# Patient Record
Sex: Male | Born: 1945 | Race: White | Hispanic: No | Marital: Married | Smoking: Former smoker
Health system: Southern US, Community
[De-identification: ages and names within clinical notes are randomized; demographics above are authoritative.]

## PROBLEM LIST (undated history)

## (undated) DIAGNOSIS — F4310 Post-traumatic stress disorder, unspecified: Secondary | ICD-10-CM

## (undated) DIAGNOSIS — C801 Malignant (primary) neoplasm, unspecified: Secondary | ICD-10-CM

## (undated) DIAGNOSIS — I480 Paroxysmal atrial fibrillation: Secondary | ICD-10-CM

## (undated) DIAGNOSIS — K219 Gastro-esophageal reflux disease without esophagitis: Secondary | ICD-10-CM

## (undated) DIAGNOSIS — Z9989 Dependence on other enabling machines and devices: Secondary | ICD-10-CM

## (undated) DIAGNOSIS — Z95810 Presence of automatic (implantable) cardiac defibrillator: Secondary | ICD-10-CM

## (undated) DIAGNOSIS — F419 Anxiety disorder, unspecified: Secondary | ICD-10-CM

## (undated) DIAGNOSIS — I639 Cerebral infarction, unspecified: Secondary | ICD-10-CM

## (undated) DIAGNOSIS — M1990 Unspecified osteoarthritis, unspecified site: Secondary | ICD-10-CM

## (undated) DIAGNOSIS — F32A Depression, unspecified: Secondary | ICD-10-CM

## (undated) DIAGNOSIS — I219 Acute myocardial infarction, unspecified: Secondary | ICD-10-CM

## (undated) DIAGNOSIS — I2510 Atherosclerotic heart disease of native coronary artery without angina pectoris: Secondary | ICD-10-CM

## (undated) DIAGNOSIS — E119 Type 2 diabetes mellitus without complications: Secondary | ICD-10-CM

## (undated) DIAGNOSIS — F329 Major depressive disorder, single episode, unspecified: Secondary | ICD-10-CM

## (undated) DIAGNOSIS — G4733 Obstructive sleep apnea (adult) (pediatric): Secondary | ICD-10-CM

## (undated) DIAGNOSIS — E7800 Pure hypercholesterolemia, unspecified: Secondary | ICD-10-CM

## (undated) DIAGNOSIS — I509 Heart failure, unspecified: Secondary | ICD-10-CM

## (undated) DIAGNOSIS — I10 Essential (primary) hypertension: Secondary | ICD-10-CM

## (undated) DIAGNOSIS — Z8719 Personal history of other diseases of the digestive system: Secondary | ICD-10-CM

## (undated) HISTORY — PX: CARDIAC DEFIBRILLATOR PLACEMENT: SHX171

## (undated) HISTORY — PX: CORONARY ANGIOPLASTY WITH STENT PLACEMENT: SHX49

---

## 1997-09-04 DIAGNOSIS — I2510 Atherosclerotic heart disease of native coronary artery without angina pectoris: Secondary | ICD-10-CM

## 1997-09-04 HISTORY — PX: CORONARY ARTERY BYPASS GRAFT: SHX141

## 1997-09-04 HISTORY — DX: Atherosclerotic heart disease of native coronary artery without angina pectoris: I25.10

## 2007-06-26 ENCOUNTER — Emergency Department (HOSPITAL_COMMUNITY): Admission: EM | Admit: 2007-06-26 | Discharge: 2007-06-26 | Payer: Self-pay | Admitting: Emergency Medicine

## 2011-06-14 LAB — URINALYSIS, ROUTINE W REFLEX MICROSCOPIC
Bilirubin Urine: NEGATIVE
Glucose, UA: >1000 — AB
Ketones, ur: NEGATIVE
Nitrite: NEGATIVE
Protein, ur: >300 — AB
Specific Gravity, Urine: 1.034 — ABNORMAL HIGH
Urobilinogen, UA: 0.2
pH: 5.5

## 2011-06-14 LAB — CBC
Platelets: 102 — ABNORMAL LOW
WBC: 5.6

## 2011-06-14 LAB — DIFFERENTIAL
Basophils Absolute: 0
Lymphocytes Relative: 3 — ABNORMAL LOW
Lymphs Abs: 0.2 — ABNORMAL LOW
Neutro Abs: 5.3
Neutrophils Relative %: 96 — ABNORMAL HIGH

## 2011-06-14 LAB — URINE MICROSCOPIC-ADD ON

## 2011-06-14 LAB — POCT CARDIAC MARKERS
Myoglobin, poc: 235
Operator id: 4661
Troponin i, poc: <0.05

## 2011-06-14 LAB — BASIC METABOLIC PANEL
BUN: 23
CO2: 28
Calcium: 9.1
Chloride: 100
Creatinine, Ser: 1.32
GFR calc Af Amer: >60
GFR calc non Af Amer: 55 — ABNORMAL LOW
Glucose, Bld: 323 — ABNORMAL HIGH
Potassium: 3.9
Sodium: 138

## 2012-10-20 ENCOUNTER — Observation Stay (HOSPITAL_COMMUNITY)
Admission: EM | Admit: 2012-10-20 | Discharge: 2012-10-22 | Disposition: A | Discharge status: Home / Self Care | Payer: Non-veteran care | Attending: Internal Medicine | Admitting: Internal Medicine

## 2012-10-20 ENCOUNTER — Emergency Department (HOSPITAL_COMMUNITY): Payer: Non-veteran care

## 2012-10-20 ENCOUNTER — Encounter (HOSPITAL_COMMUNITY): Payer: Self-pay | Admitting: Emergency Medicine

## 2012-10-20 DIAGNOSIS — I10 Essential (primary) hypertension: Secondary | ICD-10-CM

## 2012-10-20 DIAGNOSIS — D696 Thrombocytopenia, unspecified: Secondary | ICD-10-CM | POA: Diagnosis present

## 2012-10-20 DIAGNOSIS — E118 Type 2 diabetes mellitus with unspecified complications: Secondary | ICD-10-CM | POA: Diagnosis present

## 2012-10-20 DIAGNOSIS — E119 Type 2 diabetes mellitus without complications: Secondary | ICD-10-CM

## 2012-10-20 DIAGNOSIS — I2510 Atherosclerotic heart disease of native coronary artery without angina pectoris: Secondary | ICD-10-CM

## 2012-10-20 DIAGNOSIS — Z95810 Presence of automatic (implantable) cardiac defibrillator: Secondary | ICD-10-CM

## 2012-10-20 DIAGNOSIS — Z8673 Personal history of transient ischemic attack (TIA), and cerebral infarction without residual deficits: Secondary | ICD-10-CM

## 2012-10-20 DIAGNOSIS — Z794 Long term (current) use of insulin: Secondary | ICD-10-CM | POA: Insufficient documentation

## 2012-10-20 DIAGNOSIS — G459 Transient cerebral ischemic attack, unspecified: Principal | ICD-10-CM

## 2012-10-20 DIAGNOSIS — Z951 Presence of aortocoronary bypass graft: Secondary | ICD-10-CM

## 2012-10-20 DIAGNOSIS — I252 Old myocardial infarction: Secondary | ICD-10-CM | POA: Insufficient documentation

## 2012-10-20 DIAGNOSIS — E785 Hyperlipidemia, unspecified: Secondary | ICD-10-CM

## 2012-10-20 HISTORY — DX: Cerebral infarction, unspecified: I63.9

## 2012-10-20 HISTORY — DX: Heart failure, unspecified: I50.9

## 2012-10-20 HISTORY — DX: Essential (primary) hypertension: I10

## 2012-10-20 HISTORY — DX: Acute myocardial infarction, unspecified: I21.9

## 2012-10-20 LAB — CBC WITH DIFFERENTIAL/PLATELET
Basophils Absolute: 0 10*3/uL (ref 0.0–0.1)
Basophils Relative: 0 % (ref 0–1)
Eosinophils Absolute: 0.3 10*3/uL (ref 0.0–0.7)
HCT: 36.8 % — ABNORMAL LOW (ref 39.0–52.0)
MCH: 32.8 pg (ref 26.0–34.0)
MCHC: 35.9 g/dL (ref 30.0–36.0)
Monocytes Absolute: 0.6 10*3/uL (ref 0.1–1.0)
Monocytes Relative: 7 % (ref 3–12)
Neutro Abs: 6.7 10*3/uL (ref 1.7–7.7)
Neutrophils Relative %: 77 % (ref 43–77)
RDW: 13.9 % (ref 11.5–15.5)

## 2012-10-20 LAB — COMPREHENSIVE METABOLIC PANEL
AST: 19 U/L (ref 0–37)
Albumin: 3.5 g/dL (ref 3.5–5.2)
BUN: 24 mg/dL — ABNORMAL HIGH (ref 6–23)
Chloride: 107 mEq/L (ref 96–112)
Creatinine, Ser: 0.96 mg/dL (ref 0.50–1.35)
Total Bilirubin: 0.9 mg/dL (ref 0.3–1.2)
Total Protein: 6.4 g/dL (ref 6.0–8.3)

## 2012-10-20 LAB — POCT I-STAT, CHEM 8
Calcium, Ion: 1.25 mmol/L (ref 1.13–1.30)
Creatinine, Ser: 1.1 mg/dL (ref 0.50–1.35)
Glucose, Bld: 59 mg/dL — ABNORMAL LOW (ref 70–99)
HCT: 38 % — ABNORMAL LOW (ref 39.0–52.0)
Hemoglobin: 12.9 g/dL — ABNORMAL LOW (ref 13.0–17.0)

## 2012-10-20 LAB — GLUCOSE, CAPILLARY
Glucose-Capillary: 63 mg/dL — ABNORMAL LOW (ref 70–99)
Glucose-Capillary: 134 mg/dL — ABNORMAL HIGH (ref 70–99)

## 2012-10-20 LAB — TROPONIN I: Troponin I: <0.3 ng/mL (ref <0.30)

## 2012-10-20 MED ORDER — CARVEDILOL 25 MG PO TABS
25.0000 mg | ORAL_TABLET | Freq: Two times a day (BID) | ORAL | Status: DC
  Administered 2012-10-21 – 2012-10-22 (×3): 25 mg via ORAL
  Filled 2012-10-20 (×5): qty 1

## 2012-10-20 MED ORDER — ACETAMINOPHEN 650 MG RE SUPP: 650.0000 mg | RECTAL | Status: DC | PRN

## 2012-10-20 MED ORDER — ASPIRIN EC 81 MG PO TBEC
81.0000 mg | DELAYED_RELEASE_TABLET | Freq: Every day | ORAL | Status: DC
  Administered 2012-10-21 – 2012-10-22 (×2): 81 mg via ORAL
  Filled 2012-10-20 (×2): qty 1

## 2012-10-20 MED ORDER — SENNOSIDES-DOCUSATE SODIUM 8.6-50 MG PO TABS
1.0000 | ORAL_TABLET | Freq: Every evening | ORAL | Status: DC | PRN
  Filled 2012-10-20: qty 1

## 2012-10-20 MED ORDER — ATORVASTATIN CALCIUM 40 MG PO TABS
40.0000 mg | ORAL_TABLET | Freq: Every day | ORAL | Status: DC
  Administered 2012-10-20 – 2012-10-22 (×3): 40 mg via ORAL
  Filled 2012-10-20 (×3): qty 1

## 2012-10-20 MED ORDER — INSULIN GLARGINE 100 UNIT/ML ~~LOC~~ SOLN
20.0000 [IU] | Freq: Every day | SUBCUTANEOUS | Status: DC
  Administered 2012-10-20 – 2012-10-21 (×2): 20 [IU] via SUBCUTANEOUS

## 2012-10-20 MED ORDER — INSULIN ASPART 100 UNIT/ML ~~LOC~~ SOLN
0.0000 [IU] | Freq: Three times a day (TID) | SUBCUTANEOUS | Status: DC
  Administered 2012-10-21: 2 [IU] via SUBCUTANEOUS
  Administered 2012-10-22: 1 [IU] via SUBCUTANEOUS

## 2012-10-20 MED ORDER — ONDANSETRON HCL 4 MG/2ML IJ SOLN: 4.0000 mg | Freq: Four times a day (QID) | INTRAMUSCULAR | Status: DC | PRN

## 2012-10-20 MED ORDER — HYDRALAZINE HCL 10 MG PO TABS
10.0000 mg | ORAL_TABLET | Freq: Three times a day (TID) | ORAL | Status: DC
  Administered 2012-10-20 – 2012-10-22 (×5): 10 mg via ORAL
  Filled 2012-10-20 (×7): qty 1

## 2012-10-20 MED ORDER — LISINOPRIL 10 MG PO TABS
10.0000 mg | ORAL_TABLET | Freq: Every day | ORAL | Status: DC
  Administered 2012-10-21 – 2012-10-22 (×2): 10 mg via ORAL
  Filled 2012-10-20 (×2): qty 1

## 2012-10-20 MED ORDER — FLUOXETINE HCL 20 MG PO CAPS
20.0000 mg | ORAL_CAPSULE | Freq: Every day | ORAL | Status: DC
  Administered 2012-10-20 – 2012-10-21 (×2): 20 mg via ORAL
  Filled 2012-10-20 (×3): qty 1

## 2012-10-20 MED ORDER — ACETAMINOPHEN 325 MG PO TABS: 650.0000 mg | ORAL_TABLET | ORAL | Status: DC | PRN

## 2012-10-20 MED ORDER — OMEGA-3-ACID ETHYL ESTERS 1 G PO CAPS
3.0000 g | ORAL_CAPSULE | Freq: Two times a day (BID) | ORAL | Status: DC
  Administered 2012-10-20 – 2012-10-22 (×4): 3 g via ORAL
  Filled 2012-10-20 (×5): qty 3

## 2012-10-20 MED ORDER — CLOPIDOGREL BISULFATE 75 MG PO TABS
75.0000 mg | ORAL_TABLET | Freq: Every day | ORAL | Status: DC
  Administered 2012-10-21 – 2012-10-22 (×2): 75 mg via ORAL
  Filled 2012-10-20 (×4): qty 1

## 2012-10-20 MED ORDER — ENOXAPARIN SODIUM 40 MG/0.4ML ~~LOC~~ SOLN: 40.0000 mg | SUBCUTANEOUS | Status: DC

## 2012-10-20 NOTE — ED Provider Notes (Addendum)
History     CSN: [id number]  Arrival date & time 10/20/12  1253   First MD Initiated Contact with Patient 10/20/12 1528      Chief Complaint  Patient presents with  . Numbness    left side    (Consider location/radiation/quality/duration/timing/severity/associated sxs/prior treatment) The history is provided by the patient.   67 year old male has been having episodes of numbness over the last 3 days. The numbness involves the ulnar aspect of the left hand and forearm and the entire left lower leg and up to the distal thigh. Numbness tends to last about 10 minutes before resolving. He has not noticed any focal motor weakness but he is feeling generally weak today. There is no headache. He denies any nausea or vomiting. He denies any incoordination. There has been no chest pain, heaviness, tightness, pressure. Of note, he does have a pacemaker defibrillator which was inserted one year ago and he showed me the card that he was given an additional staple whether the unit is MRI compliant.  Past Medical History  Diagnosis Date  . Hypertension   . Diabetes mellitus without complication   . Stroke   . CHF (congestive heart failure)   . Coronary artery disease   . Myocardial infarction     Past Surgical History  Procedure Laterality Date  . Coronary artery bypass graft    . Heart stent      Dr. Yetta Barre in [place], [place]  . Cardiac defibrillator placement      Biotronik    No family history on file.  History  Substance Use Topics  . Smoking status: Never Smoker   . Smokeless tobacco: Not on file  . Alcohol Use: Yes     Comment: seldom      Review of Systems  All other systems reviewed and are negative.    Allergies  Review of patient's allergies indicates not on file.  Home Medications   Current Outpatient Rx  Name  Route  Sig  Dispense  Refill  . aspirin EC 81 MG tablet   Oral   Take 81 mg by mouth daily.         Marland Kitchen atorvastatin (LIPITOR) 80 MG tablet   Oral    Take 40 mg by mouth daily.         . carvedilol (COREG) 25 MG tablet   Oral   Take 25 mg by mouth 2 (two) times daily with a meal.         . clopidogrel (PLAVIX) 75 MG tablet   Oral   Take 75 mg by mouth daily.         . cyanocobalamin (,VITAMIN B-12,) 1000 MCG/ML injection   Intramuscular   Inject 1,000 mcg into the muscle every 30 (thirty) days.         Marland Kitchen FLUoxetine (PROZAC) 20 MG capsule   Oral   Take 20 mg by mouth at bedtime.         Marland Kitchen glyBURIDE (DIABETA) 5 MG tablet   Oral   Take 10 mg by mouth 2 (two) times daily.         . hydrALAZINE (APRESOLINE) 10 MG tablet   Oral   Take 20 mg by mouth 3 (three) times daily.         . insulin glargine (LANTUS) 100 UNIT/ML injection   Subcutaneous   Inject 30 Units into the skin at bedtime.         Marland Kitchen lisinopril (PRINIVIL,ZESTRIL) 40 MG tablet  Oral   Take 40 mg by mouth daily.         . metFORMIN (GLUCOPHAGE) 500 MG tablet   Oral   Take 500 mg by mouth 2 (two) times daily with a meal.         . omega-3 acid ethyl esters (LOVAZA) 1 G capsule   Oral   Take 3 g by mouth 2 (two) times daily.         Marland Kitchen spironolactone (ALDACTONE) 25 MG tablet   Oral   Take 25 mg by mouth daily.           BP 168/73  Pulse 65  Temp(Src) 98.7 F (37.1 C) (Oral)  Resp 18  SpO2 99%  Physical Exam  Nursing note and vitals reviewed.  67 year old male, resting comfortably and in no acute distress. Vital signs are significant for hypertension with blood pressure 160/73. Oxygen saturation is 99%, which is normal. Head is normocephalic and atraumatic. PERRLA, EOMI. Oropharynx is clear. Fundi show no hemorrhage, exudate, or papilledema. Neck is nontender and supple without adenopathy or JVD. There no carotid bruits. Back is nontender and there is no CVA tenderness. Lungs are clear without rales, wheezes, or rhonchi. Chest is nontender. Heart has regular rate and rhythm without murmur. Abdomen is soft, flat, nontender  without masses or hepatosplenomegaly and peristalsis is normoactive. Extremities have no 2 edema, full range of motion is present. Skin is warm and dry without rash. Neurologic: Mental status is normal, cranial nerves are intact, there are no motor or sensory deficits. There is no pronator drift.  ED Course  Procedures (including critical care time)  Results for orders placed during the hospital encounter of 10/20/12  GLUCOSE, CAPILLARY      Result Value Range   Glucose-Capillary 63 (*) 70 - 99 mg/dL   Comment 1 Notify RN    CBC WITH DIFFERENTIAL      Result Value Range   WBC 8.7  4.0 - 10.5 K/uL   RBC 4.03 (*) 4.22 - 5.81 MIL/uL   Hemoglobin 13.2  13.0 - 17.0 g/dL   HCT 95.6 (*) 21.3 - 08.6 %   MCV 91.3  78.0 - 100.0 fL   MCH 32.8  26.0 - 34.0 pg   MCHC 35.9  30.0 - 36.0 g/dL   RDW 57.8  46.9 - 62.9 %   Platelets 81 (*) 150 - 400 K/uL   Neutrophils Relative 77  43 - 77 %   Neutro Abs 6.7  1.7 - 7.7 K/uL   Lymphocytes Relative 13  12 - 46 %   Lymphs Abs 1.1  0.7 - 4.0 K/uL   Monocytes Relative 7  3 - 12 %   Monocytes Absolute 0.6  0.1 - 1.0 K/uL   Eosinophils Relative 4  0 - 5 %   Eosinophils Absolute 0.3  0.0 - 0.7 K/uL   Basophils Relative 0  0 - 1 %   Basophils Absolute 0.0  0.0 - 0.1 K/uL  COMPREHENSIVE METABOLIC PANEL      Result Value Range   Sodium 141  135 - 145 mEq/L   Potassium 4.1  3.5 - 5.1 mEq/L   Chloride 107  96 - 112 mEq/L   CO2 24  19 - 32 mEq/L   Glucose, Bld 112 (*) 70 - 99 mg/dL   BUN 24 (*) 6 - 23 mg/dL   Creatinine, Ser 5.28  0.50 - 1.35 mg/dL   Calcium 9.3  8.4 - 41.3 mg/dL  Total Protein 6.4  6.0 - 8.3 g/dL   Albumin 3.5  3.5 - 5.2 g/dL   AST 19  0 - 37 U/L   ALT 14  0 - 53 U/L   Alkaline Phosphatase 69  39 - 117 U/L   Total Bilirubin 0.9  0.3 - 1.2 mg/dL   GFR calc non Af Amer 83 (*) >90 mL/min   GFR calc Af Amer >90  >90 mL/min  TROPONIN I      Result Value Range   Troponin I <0.30  <0.30 ng/mL  POCT I-STAT, CHEM 8      Result Value  Range   Sodium 143  135 - 145 mEq/L   Potassium 3.9  3.5 - 5.1 mEq/L   Chloride 105  96 - 112 mEq/L   BUN 25 (*) 6 - 23 mg/dL   Creatinine, Ser 9.60  0.50 - 1.35 mg/dL   Glucose, Bld 59 (*) 70 - 99 mg/dL   Calcium, Ion 4.54  0.98 - 1.30 mmol/L   TCO2 26  0 - 100 mmol/L   Hemoglobin 12.9 (*) 13.0 - 17.0 g/dL   HCT 11.9 (*) 14.7 - 82.9 %   Dg Chest 2 View  10/20/2012  *RADIOLOGY REPORT*  Clinical Data: Left-sided numbness, tingling.  CHEST - 2 VIEW  Comparison: None  Findings: Left AICD is in place with leads in the right atrium and right ventricle.  Prior CABG.  Cardiomegaly.  No focal airspace opacities or effusions.  No acute bony abnormality.  IMPRESSION: Cardiomegaly.  No acute cardiopulmonary disease.   Original Report Authenticated By: Charlett Nose, M.D.    Ct Head Wo Contrast  10/20/2012  *RADIOLOGY REPORT*  Clinical Data: Left-sided numbness.  CT HEAD WITHOUT CONTRAST  Technique:  Contiguous axial images were obtained from the base of the skull through the vertex without contrast.  Comparison: None.  Findings: Area of low density in the anterior left frontal lobe compatible with old infarct.  Mild chronic microvascular changes in the deep white matter.  No evidence for acute infarction.  No hemorrhage or hydrocephalus.  No midline shift.  No acute calvarial abnormality. Visualized paranasal sinuses and mastoids clear. Orbital soft tissues unremarkable.  IMPRESSION: Old left frontal infarct.  No acute infarction.  Chronic microvascular ischemic changes throughout the deep white matter.   Original Report Authenticated By: Charlett Nose, M.D.     Date: 10/20/2012  Rate: 59  Rhythm: sinus bradycardia  QRS Axis: normal  Intervals: PR prolonged  ST/T Wave abnormalities: nonspecific T wave changes  Conduction Disutrbances:first-degree A-V block   Narrative Interpretation: Sinus bradycardia with first degree AV block, nonspecific T wave flattening, old inferior wall myocardial infarction,  possible old anteroseptal wall myocardial infarction. No prior ECG available for comparison.  Old EKG Reviewed: none available     1. Transient ischemic attack       MDM  Apparent transient ischemic attacks. Workup has been initiated but MRI is not being done in children we can determine for sure whether his defibrillator is MRI compliant.  Workup is significant for old stroke. Case is discussed with Dr.[facility]herge [facility] who agrees to admit the patient.       Dione Booze, MD 10/20/12 1830  Dione Booze, MD 10/20/12 [phone]

## 2012-10-20 NOTE — ED Notes (Signed)
Pt c/o numbness from fingers up left arm and toes up left leg. Numbness increased today. [facility] is [facility] [place].

## 2012-10-20 NOTE — ED Notes (Signed)
Started 2 days ago- numbness from left pinkie half way up arm, numbness from left foot half way up left leg. Numbness went away, but comes back-- numbness to left arm "very slight" at present.

## 2012-10-20 NOTE — Progress Notes (Signed)
Centralized monitor tech called and patient placed on telemetry. Patient oriented to room.

## 2012-10-20 NOTE — H&P (Addendum)
[facility] History and Physical  Vernon Briggs ZOX:[id number] DOB: [birth year] DOA: 10/20/2012  Referring physician: Dr. Preston Fleeting PCP: Default, Provider, MD   Chief Complaint: Transient numbness  HPI: Vernon Briggs is a 67 y.o. male  This is a 67 year old male with a past medical history of coronary artery disease status post previous heart attacks, and per patient, 6 stents placed as well as a bypass surgery, and an ICD placed last [facility] at the [facility] for - per patient - low function of the heart pump. He denies any previous arrhythmias. He also has a history of a previous CVA, left frontal, with right-sided weakness that has resolved with physical therapy. He currently denies any motor deficits on the right. Has a history of hyperlipidemia and is on statin. Has a history of diabetes, insulin-dependent, without complications - per patient. He denies any numbness from the diabetes, he is followed regularly by an ophthalmologist, and he is unaware of any renal disease.   He presents today with a chief complaint of numbness on his left hand and forearm (mainly the fifth digit) as well as numbness of the entire left lower leg and up to the distal thigh. He states that these episodes come and go without any precipitating factors. Usually last about 10-15 minutes. He is also feeling weak and "woozy". Episodes usually resolve on their own. He has no abdominal complaints, denies any nausea, vomiting or diarrhea. He denies any fever or chills. He denies any syncopal episodes, or episodes of confusion with his numbness. He is fully functional at home. He denies any history of peptic ulcer disease or GI bleed  Review of Systems: As per history of present illness otherwise negative  Past Medical History  Diagnosis Date  . Hypertension   . Diabetes mellitus without complication   . Stroke   . CHF (congestive heart failure)   . Coronary artery disease   . Myocardial infarction    Past Surgical  History  Procedure Laterality Date  . Coronary artery bypass graft    . Heart stent      Dr. Yetta Barre in [place], [place]  . Cardiac defibrillator placement      Biotronik   Social History:  reports that he has never smoked. He does not have any smokeless tobacco history on file. He reports that  drinks alcohol. He reports that he does not use illicit drugs.  No known drug allergies  Family history significant for heart attack in his father. His mother lived to 85 years old.  Prior to Admission medications   Medication Sig Start Date End Date Taking? Authorizing Provider  aspirin EC 81 MG tablet Take 81 mg by mouth daily.   Yes Historical Provider, MD  atorvastatin (LIPITOR) 80 MG tablet Take 40 mg by mouth daily.   Yes Historical Provider, MD  carvedilol (COREG) 25 MG tablet Take 25 mg by mouth 2 (two) times daily with a meal.   Yes Historical Provider, MD  clopidogrel (PLAVIX) 75 MG tablet Take 75 mg by mouth daily.   Yes Historical Provider, MD  cyanocobalamin (,VITAMIN B-12,) 1000 MCG/ML injection Inject 1,000 mcg into the muscle every 30 (thirty) days.   Yes Historical Provider, MD  FLUoxetine (PROZAC) 20 MG capsule Take 20 mg by mouth at bedtime.   Yes Historical Provider, MD  glyBURIDE (DIABETA) 5 MG tablet Take 10 mg by mouth 2 (two) times daily.   Yes Historical Provider, MD  hydrALAZINE (APRESOLINE) 10 MG tablet Take 20 mg by mouth  3 (three) times daily.   Yes Historical Provider, MD  insulin glargine (LANTUS) 100 UNIT/ML injection Inject 30 Units into the skin at bedtime.   Yes Historical Provider, MD  lisinopril (PRINIVIL,ZESTRIL) 40 MG tablet Take 40 mg by mouth daily.   Yes Historical Provider, MD  metFORMIN (GLUCOPHAGE) 500 MG tablet Take 500 mg by mouth 2 (two) times daily with a meal.   Yes Historical Provider, MD  omega-3 acid ethyl esters (LOVAZA) 1 G capsule Take 3 g by mouth 2 (two) times daily.   Yes Historical Provider, MD  spironolactone (ALDACTONE) 25 MG tablet Take 25 mg  by mouth daily.   Yes Historical Provider, MD   Physical Exam: Filed Vitals:   10/20/12 1309 10/20/12 1524 10/20/12 1532 10/20/12 1751  BP: 154/73  168/73 166/72  Pulse: 63  65 66  Temp: 97.9 F (36.6 C) 98.7 F (37.1 C) 98.7 F (37.1 C)   TempSrc: Oral  Oral   Resp: 18   16  SpO2: 98%  99% 98%     General:  He is in no acute distress  Eyes: Pupils are equally round and reactive to light, extraocular movements intact  ENT: Moist oropharynx  Neck: Supple, no JVD appreciated  Cardiovascular: Regular rate and rhythm, without murmurs rubs or gallops  Respiratory: Lungs clear to auscultation bilaterally, good air movement, without any wheezing or crackles.  Abdomen: Soft, nontender to palpation  Skin: No rashes  Musculoskeletal: No peripheral edema  Psychiatric: Normal mood and affect  Neurologic: Cranial nerves 2-12 grossly intact, sensation intact in upper and lower extremities, motor strength 5 out of 5 in all 4 extremities, DTR 2+.  Labs on Admission:  Basic Metabolic Panel:  Recent Labs Lab 10/20/12 1503 10/20/12 1633  NA 143 141  K 3.9 4.1  CL 105 107  CO2  --  24  GLUCOSE 59* 112*  BUN 25* 24*  CREATININE 1.10 0.96  CALCIUM  --  9.3   Liver Function Tests:  Recent Labs Lab 10/20/12 1633  AST 19  ALT 14  ALKPHOS 69  BILITOT 0.9  PROT 6.4  ALBUMIN 3.5   CBC:  Recent Labs Lab 10/20/12 1503 10/20/12 1633  WBC  --  8.7  NEUTROABS  --  6.7  HGB 12.9* 13.2  HCT 38.0* 36.8*  MCV  --  91.3  PLT  --  81*   Cardiac Enzymes:  Recent Labs Lab 10/20/12 1630  TROPONINI <0.30    CBG:  Recent Labs Lab 10/20/12 1445  GLUCAP 63*    Radiological Exams on Admission: Dg Chest 2 View  10/20/2012  *RADIOLOGY REPORT*  Clinical Data: Left-sided numbness, tingling.  CHEST - 2 VIEW  Comparison: None  Findings: Left AICD is in place with leads in the right atrium and right ventricle.  Prior CABG.  Cardiomegaly.  No focal airspace opacities or  effusions.  No acute bony abnormality.  IMPRESSION: Cardiomegaly.  No acute cardiopulmonary disease.   Original Report Authenticated By: Charlett Nose, M.D.    Ct Head Wo Contrast  10/20/2012  *RADIOLOGY REPORT*  Clinical Data: Left-sided numbness.  CT HEAD WITHOUT CONTRAST  Technique:  Contiguous axial images were obtained from the base of the skull through the vertex without contrast.  Comparison: None.  Findings: Area of low density in the anterior left frontal lobe compatible with old infarct.  Mild chronic microvascular changes in the deep white matter.  No evidence for acute infarction.  No hemorrhage or hydrocephalus.  No midline  shift.  No acute calvarial abnormality. Visualized paranasal sinuses and mastoids clear. Orbital soft tissues unremarkable.  IMPRESSION: Old left frontal infarct.  No acute infarction.  Chronic microvascular ischemic changes throughout the deep white matter.   Original Report Authenticated By: Charlett Nose, M.D.     EKG: Independently reviewed. Sinus bradycardia  Assessment/Plan Principal Problem:   TIA (transient ischemic attack) Active Problems:   History of CVA (cerebrovascular accident)   CAD (coronary artery disease)   Hx of CABG   ICD (implantable cardiac defibrillator) in place   Diabetes mellitus   Hyperlipidemia   Hypertension   Thrombocytopenia   1. TIA - this is of high concern given his presentation and his history. Neurology has been consulted, Dr Cyril Mourning. We cannot pursue an MRI at this time secondary to his ICD. CAT scan of the head showed the old infarct without any new acute findings. We'll get a 2-D echo, carotid duplex, lipid panel, hemoglobin A1c. We'll continue his aspirin and Plavix.  2. History of CVA - right-sided weakness resolved. We'll continue his home medications.  3. Coronary artery disease - will continue his home medications. 2-D echo pending. [facility] is [facility] compatible or  not. 4. Probable systolic heart failure - he has ICD and was told about poor pump function. 2D echo pending.  5. ICD - will place patient on telemetry.  6. Hyperlipidemia - update a lipid panel and continue his home statin.  7. Diabetes - will hold oral hypoglycemics and use insulin only 8. Hypertension - restart his home meds at lower doses.  9. Thrombocytopenia - patient thinks he has been told this before. Platelets 80 on admission. Platelets low at 102 in 2008. 10. Prophylaxis - SCDs given Tcpenia.   Code Status: DO NOT RESUSCITATE  Family Communication: wife  Disposition Plan: 1-2 days pending TIA workup.  Time spent: 25  Pamella Pert [facility] Pager [phone]  If 7PM-7AM, please contact night-coverage [email or web address] Password [facility] 10/20/2012, 7:04 PM

## 2012-10-20 NOTE — ED Notes (Signed)
NO ANSWER

## 2012-10-20 NOTE — Consult Note (Signed)
Referring Physician: Pamella Pert, MD     Chief Complaint: episodic numbness left arm and leg.  HPI:                                                                                                                                         Vernon Briggs is an 67 y.o. 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 He said that the numbness occurs usually simultaneously and last for about 10 minutes and completely resolves. No face involvement and there is not associated weakness, headache, vertigo, double vision, difficulty swallowing, slurred speech, language or visual impairment. He is fully aware of his surroundings during such episodes.  Feels " a little dizzy and oozy" at the time of the episode. The last episode occurred this morning. He gets his medical care at the [facility] hospital, called there today and was advised to come to [facility] ED for further evaluation. Takes aspirin and plavix religiously. CT brain in the ED showed no acute intracranial abnormality.   LSN: 3 days ago. tPA Given: no, as his symptoms don't meet criteria for thrombolysis.  Past Medical History  Diagnosis Date  . Hypertension   . Diabetes mellitus without complication   . Stroke   . CHF (congestive heart failure)   . Coronary artery disease   . Myocardial infarction     Past Surgical History  Procedure Laterality Date  . Coronary artery bypass graft    . Heart stent      Dr. Yetta Barre in [place], [place]  . Cardiac defibrillator placement      [facility]    History  reviewed. No pertinent family history. Social History:  reports that he has never smoked. He does not have any smokeless tobacco history on file. He reports that  drinks alcohol. He reports that he does not use illicit drugs.  Allergies: No Known Allergies  Medications:  I have reviewed the patient's current medications.  ROS:                                                                                                                                       History obtained from the patient and chart review.  General ROS: negative for - chills, fatigue, fever, night sweats, weight gain or weight loss Psychological ROS: negative for - behavioral disorder, hallucinations, memory difficulties, mood swings or suicidal ideation Ophthalmic ROS: negative for - blurry vision, double vision, eye pain or loss of vision ENT ROS: negative for - epistaxis, nasal discharge, oral lesions, sore throat, tinnitus or vertigo Allergy and Immunology ROS: negative for - hives or itchy/watery eyes Hematological and Lymphatic ROS: negative for - bleeding problems, bruising or swollen lymph nodes Endocrine ROS: negative for - galactorrhea, hair pattern changes, polydipsia/polyuria or temperature intolerance Respiratory ROS: negative for - cough, hemoptysis, shortness of breath or wheezing Cardiovascular ROS: negative for - chest pain, dyspnea on exertion, edema or irregular heartbeat Gastrointestinal ROS: negative for - abdominal pain, diarrhea, hematemesis, nausea/vomiting or stool incontinence Genito-Urinary ROS: negative for - dysuria, hematuria, incontinence or urinary frequency/urgency Musculoskeletal ROS: negative for - joint swelling or muscular weakness Neurological ROS: as noted in HPI Dermatological ROS: negative for rash and skin lesion changes.    Physical exam: pleasant male in  no apparent distress.Blood pressure 162/79, pulse 67, temperature 98 F (36.7 C), temperature source Oral, resp. rate 20, height 6' (1.829 m), weight 108.3 kg (238 lb 12.1 oz), SpO2 99.00%. Head: normocephalic. Neck: supple, no bruits, no JVD. Cardiac: no murmurs. Lungs: clear. Abdomen: soft, no tender, no mass. Extremities: no edema.   Neurologic Examination:                                                                                                      Mental Status: Alert, awake, oriented x 4, thought content appropriate.  Speech fluent without evidence of aphasia.  Able to follow 3 step commands without difficulty. Cranial Nerves: II: Discs flat bilaterally; Visual fields grossly normal, pupils equal, round, reactive to light and accommodation III,IV, VI: ptosis not present, extra-ocular motions intact bilaterally V,VII: smile symmetric, facial light touch sensation normal bilaterally VIII: hearing normal bilaterally IX,X: gag reflex present XI: bilateral shoulder shrug XII: midline tongue extension Motor: Right : Upper extremity   5/5    Left:     Upper extremity   5/5  Lower extremity   5/5  Lower extremity   5/5 Tone and bulk:normal tone throughout; no atrophy noted Sensory: Pinprick and light touch intact throughout, bilaterally Deep Tendon Reflexes: 1+ and symmetric throughout Plantars: Right: downgoing   Left: downgoing Cerebellar: normal finger-to-nose,  normal heel-to-shin test Gait: no ataxia. CV: pulses palpable throughout       Results for orders placed during the hospital encounter of 10/20/12 (from the past 48 hour(s))  GLUCOSE, CAPILLARY     Status: Abnormal   Collection Time    10/20/12  2:45 PM      Result Value Range   Glucose-Capillary 63 (*) 70 - 99 mg/dL   Comment 1 Notify RN    POCT I-STAT, CHEM 8     Status: Abnormal   Collection Time    10/20/12  3:03 PM      Result Value Range   Sodium 143  135 - 145 mEq/L   Potassium 3.9  3.5 -  5.1 mEq/L   Chloride 105  96 - 112 mEq/L   BUN 25 (*) 6 - 23 mg/dL   Creatinine, Ser 9.14  0.50 - 1.35 mg/dL   Glucose, Bld 59 (*) 70 - 99 mg/dL   Calcium, Ion 7.82  9.56 - 1.30 mmol/L   TCO2 26  0 - 100 mmol/L   Hemoglobin 12.9 (*) 13.0 - 17.0 g/dL   HCT 21.3 (*) 08.6 - 57.8 %  TROPONIN I     Status: None   Collection Time    10/20/12  4:30 PM      Result Value Range   Troponin I <0.30  <0.30 ng/mL   Comment:            Due to the release kinetics of cTnI,     a negative result within the first hours     of the onset of symptoms does not rule out     myocardial infarction with certainty.     If myocardial infarction is still suspected,     repeat the test at appropriate intervals.  CBC WITH DIFFERENTIAL     Status: Abnormal   Collection Time    10/20/12  4:33 PM      Result Value Range   WBC 8.7  4.0 - 10.5 K/uL   RBC 4.03 (*) 4.22 - 5.81 MIL/uL   Hemoglobin 13.2  13.0 - 17.0 g/dL   HCT 46.9 (*) 62.9 - 52.8 %   MCV 91.3  78.0 - 100.0 fL   MCH 32.8  26.0 - 34.0 pg   MCHC 35.9  30.0 - 36.0 g/dL   RDW 41.3  24.4 - 01.0 %   Platelets 81 (*) 150 - 400 K/uL   Comment: PLATELET COUNT CONFIRMED BY SMEAR     REPEATED TO VERIFY   Neutrophils Relative 77  43 - 77 %   Neutro Abs 6.7  1.7 - 7.7 K/uL   Lymphocytes Relative 13  12 - 46 %   Lymphs Abs 1.1  0.7 - 4.0 K/uL   Monocytes Relative 7  3 - 12 %   Monocytes Absolute 0.6  0.1 - 1.0 K/uL   Eosinophils Relative 4  0 - 5 %   Eosinophils Absolute 0.3  0.0 - 0.7 K/uL   Basophils Relative 0  0 - 1 %   Basophils Absolute 0.0  0.0 - 0.1 K/uL  COMPREHENSIVE METABOLIC PANEL     Status: Abnormal   Collection Time    10/20/12  4:33 PM      Result  Value Range   Sodium 141  135 - 145 mEq/L   Potassium 4.1  3.5 - 5.1 mEq/L   Chloride 107  96 - 112 mEq/L   CO2 24  19 - 32 mEq/L   Glucose, Bld 112 (*) 70 - 99 mg/dL   BUN 24 (*) 6 - 23 mg/dL   Creatinine, Ser 1.61  0.50 - 1.35 mg/dL   Calcium 9.3  8.4 - 09.6 mg/dL   Total Protein 6.4   6.0 - 8.3 g/dL   Albumin 3.5  3.5 - 5.2 g/dL   AST 19  0 - 37 U/L   ALT 14  0 - 53 U/L   Alkaline Phosphatase 69  39 - 117 U/L   Total Bilirubin 0.9  0.3 - 1.2 mg/dL   GFR calc non Af Amer 83 (*) >90 mL/min   GFR calc Af Amer >90  >90 mL/min   Comment:            The eGFR has been calculated     using the CKD EPI equation.     This calculation has not been     validated in all clinical     situations.     eGFR's persistently     <90 mL/min signify     possible Chronic Kidney Disease.  GLUCOSE, CAPILLARY     Status: Abnormal   Collection Time    10/20/12  9:07 PM      Result Value Range   Glucose-Capillary 134 (*) 70 - 99 mg/dL   Comment 1 Documented in Chart     Comment 2 Notify RN     Dg Chest 2 View  10/20/2012  *RADIOLOGY REPORT*  Clinical Data: Left-sided numbness, tingling.  CHEST - 2 VIEW  Comparison: None  Findings: Left AICD is in place with leads in the right atrium and right ventricle.  Prior CABG.  Cardiomegaly.  No focal airspace opacities or effusions.  No acute bony abnormality.  IMPRESSION: Cardiomegaly.  No acute cardiopulmonary disease.   Original Report Authenticated By: Charlett Nose, M.D.    Ct Head Wo Contrast  10/20/2012  *RADIOLOGY REPORT*  Clinical Data: Left-sided numbness.  CT HEAD WITHOUT CONTRAST  Technique:  Contiguous axial images were obtained from the base of the skull through the vertex without contrast.  Comparison: None.  Findings: Area of low density in the anterior left frontal lobe compatible with old infarct.  Mild chronic microvascular changes in the deep white matter.  No evidence for acute infarction.  No hemorrhage or hydrocephalus.  No midline shift.  No acute calvarial abnormality. Visualized paranasal sinuses and mastoids clear. Orbital soft tissues unremarkable.  IMPRESSION: Old left frontal infarct.  No acute infarction.  Chronic microvascular ischemic changes throughout the deep white matter.   Original Report Authenticated By: Charlett Nose,  M.D.      Assessment: 67 y.o. male with multiple risk factors for stroke and recent onset of episodic, intermittent paresthesias involving left arm and left lower extremity with a pattern described above. Neuro-exam and CT brain are unimpressive.  Differential diagnosis include TIA and less likely focal sensory seizures or an asymmetric sensory polyneuropathy (this will be rather unusual given the paroxysmal character of patient's symptoms). Carotid ultrasound and TTE requested. Unfortunately, can not have MRI due to ICD placement.  Stroke team to resume care in the morning and make further recommendations accordingly.  Noemi Chapel, MD [facility] [phone]  10/20/2012, 10:28 PM

## 2012-10-21 ENCOUNTER — Inpatient Hospital Stay (HOSPITAL_COMMUNITY): Payer: Non-veteran care

## 2012-10-21 DIAGNOSIS — Z8673 Personal history of transient ischemic attack (TIA), and cerebral infarction without residual deficits: Secondary | ICD-10-CM

## 2012-10-21 DIAGNOSIS — G459 Transient cerebral ischemic attack, unspecified: Secondary | ICD-10-CM

## 2012-10-21 DIAGNOSIS — I2510 Atherosclerotic heart disease of native coronary artery without angina pectoris: Secondary | ICD-10-CM

## 2012-10-21 LAB — URINALYSIS, ROUTINE W REFLEX MICROSCOPIC
Glucose, UA: NEGATIVE mg/dL
Leukocytes, UA: NEGATIVE
Nitrite: NEGATIVE
Specific Gravity, Urine: 1.024 (ref 1.005–1.030)
pH: 5 (ref 5.0–8.0)

## 2012-10-21 LAB — CBC
MCH: 31.8 pg (ref 26.0–34.0)
Platelets: 74 10*3/uL — ABNORMAL LOW (ref 150–400)
RBC: 4.06 MIL/uL — ABNORMAL LOW (ref 4.22–5.81)
WBC: 7.6 10*3/uL (ref 4.0–10.5)

## 2012-10-21 LAB — GLUCOSE, CAPILLARY

## 2012-10-21 LAB — URINE MICROSCOPIC-ADD ON

## 2012-10-21 LAB — LIPID PANEL
Cholesterol: 75 mg/dL (ref 0–200)
VLDL: 17 mg/dL (ref 0–40)

## 2012-10-21 MED ORDER — WHITE PETROLATUM GEL
Status: AC
  Administered 2012-10-21: 0.2
  Filled 2012-10-21: qty 5

## 2012-10-21 MED ORDER — IOHEXOL 350 MG/ML SOLN
50.0000 mL | Freq: Once | INTRAVENOUS | Status: AC | PRN
  Administered 2012-10-21: 100 mL via INTRAVENOUS

## 2012-10-21 NOTE — Progress Notes (Addendum)
[facility] PROGRESS NOTE  Bary Limbach ZOX:[id number] DOB: December 18, 1945 DOA: 10/20/2012 PCP: Default, Provider, MD  Assessment/Plan: 1. TIA - Another episode since hospitalized. Neurology has been consulted. 2-D echo, carotid duplex pending. Lipid panel done, looks OK. Hemoglobin A1c 5.9. We'll continue his aspirin and Plavix.  2. History of CVA - right-sided weakness resolved. We'll continue his home medications.   3. Coronary artery disease - will continue his home medications. 2-D echo pending.   4. Chronic systolic and diastolic heart failure - he has ICD and was told about poor pump function. 2D echo pending.  5. ICD - will place patient on telemetry.   6. Hyperlipidemia - update a lipid panel and continue his home statin.   7. Diabetes - will hold oral hypoglycemics and use insulin only   8. Hypertension - restart his home meds at lower doses.   9. Thrombocytopenia - patient thinks he has been told this before. Platelets 80 on admission. Platelets low at 102 in 2008. Platelets 74 this morning.   10. Prophylaxis - SCDs given Tcpenia.  Code Status: DNR Family Communication: none  Disposition Plan: likely home once full neuro evaluation  Consultants:  Neurology  Procedures:  none  Antibiotics:  none  HPI/Subjective: - no complaints this morning, had another episode of paresthesias in the hospital  Objective: Filed Vitals:   10/21/12 0400 10/21/12 0600 10/21/12 0800 10/21/12 1000  BP: 140/68 165/90 165/79 159/76  Pulse: 60 59 70 67  Temp: 98 F (36.7 C) 98 F (36.7 C) 98.1 F (36.7 C) 98.2 F (36.8 C)  TempSrc: Oral Oral Oral Oral  Resp: 20 20 20 20   Height:      Weight:      SpO2: 99% 98% 100% 100%   No intake or output data in the 24 hours ending 10/21/12 1246 Filed Weights   10/20/12 2200  Weight: 108.3 kg (238 lb 12.1 oz)    Exam:   General:  NAD  Cardiovascular: RRR without MRG  Respiratory: CTA biL  Abdomen: soft,  NTTP  Neuro: non focal  Data Reviewed: Basic Metabolic Panel:  Recent Labs Lab 10/20/12 1503 10/20/12 1633  NA 143 141  K 3.9 4.1  CL 105 107  CO2  --  24  GLUCOSE 59* 112*  BUN 25* 24*  CREATININE 1.10 0.96  CALCIUM  --  9.3   Liver Function Tests:  Recent Labs Lab 10/20/12 1633  AST 19  ALT 14  ALKPHOS 69  BILITOT 0.9  PROT 6.4  ALBUMIN 3.5   CBC:  Recent Labs Lab 10/20/12 1503 10/20/12 1633 10/21/12 0445  WBC  --  8.7 7.6  NEUTROABS  --  6.7  --   HGB 12.9* 13.2 12.9*  HCT 38.0* 36.8* 37.2*  MCV  --  91.3 91.6  PLT  --  81* 74*   Cardiac Enzymes:  Recent Labs Lab 10/20/12 1630  TROPONINI <0.30   CBG:  Recent Labs Lab 10/20/12 1445 10/20/12 2107 10/21/12 0632 10/21/12 1151  GLUCAP 63* 134* 79 168*    Studies: Dg Chest 2 View  10/20/2012  *RADIOLOGY REPORT*  Clinical Data: Left-sided numbness, tingling.  CHEST - 2 VIEW  Comparison: None  Findings: Left AICD is in place with leads in the right atrium and right ventricle.  Prior CABG.  Cardiomegaly.  No focal airspace opacities or effusions.  No acute bony abnormality.  IMPRESSION: Cardiomegaly.  No acute cardiopulmonary disease.   Original Report Authenticated By: Charlett Nose, M.D.  Ct Head Wo Contrast  10/20/2012  *RADIOLOGY REPORT*  Clinical Data: Left-sided numbness.  CT HEAD WITHOUT CONTRAST  Technique:  Contiguous axial images were obtained from the base of the skull through the vertex without contrast.  Comparison: None.  Findings: Area of low density in the anterior left frontal lobe compatible with old infarct.  Mild chronic microvascular changes in the deep white matter.  No evidence for acute infarction.  No hemorrhage or hydrocephalus.  No midline shift.  No acute calvarial abnormality. Visualized paranasal sinuses and mastoids clear. Orbital soft tissues unremarkable.  IMPRESSION: Old left frontal infarct.  No acute infarction.  Chronic microvascular ischemic changes throughout the  deep white matter.   Original Report Authenticated By: Charlett Nose, M.D.     Scheduled Meds: . aspirin EC  81 mg Oral Daily  . atorvastatin  40 mg Oral Daily  . carvedilol  25 mg Oral BID WC  . clopidogrel  75 mg Oral Q breakfast  . FLUoxetine  20 mg Oral QHS  . hydrALAZINE  10 mg Oral TID  . insulin aspart  0-9 Units Subcutaneous TID WC  . insulin glargine  20 Units Subcutaneous QHS  . lisinopril  10 mg Oral Daily  . omega-3 acid ethyl esters  3 g Oral BID   Continuous Infusions:   Principal Problem:   TIA (transient ischemic attack) Active Problems:   History of CVA (cerebrovascular accident)   CAD (coronary artery disease)   Hx of CABG   ICD (implantable cardiac defibrillator) in place   Diabetes mellitus   Hyperlipidemia   Hypertension   Thrombocytopenia   Pamella Pert  [facility] Pager [phone]. If 7 PM - 7 AM, please contact night-coverage at [email or web address], password [facility] 10/21/2012, 12:46 PM  LOS: 1 day

## 2012-10-21 NOTE — Progress Notes (Signed)
Patient's numbness to outside of left hand and foot returned this evening upon ambulating. Patient denied any sensory deficits throughout the evening until this time. Will continue to monitor.

## 2012-10-21 NOTE — Clinical Documentation Improvement (Signed)
CHF DOCUMENTATION CLARIFICATION QUERY  THIS DOCUMENT IS NOT A PERMANENT PART OF THE MEDICAL RECORD  Please update your documentation within the medical record to reflect your response to this query.                                                                                    10/21/12  Dear Dr.Reese Stockman,  In a better effort to capture your patient's severity of illness, reflect appropriate length of stay and utilization of resources, a review of the patient medical record has revealed the following indicators the diagnosis of Heart Failure.   Based on your clinical judgment, please clarify and document in a progress note and/or discharge summary the clinical condition associated with the following supporting information: In responding to this query please exercise your independent judgment.  The fact that a query is asked, does not imply that any particular answer is desired or expected.    Thank you for capturing "probable systolic heart failure" in the H&P on 2/16. If possible, could you please help provide greater specificity for this diagnosis by adding the acuity in the progress note and/or discharge summary. THANK YOU!  [facility]: The acuity and type of CHF should always be documented when known [acute, chronic, acute on chronic, systolic, diastolic, systolic and diastolic].  Possible Clinical Conditions?  - ACUTE Systolic Congestive Heart Failure  - CHRONIC Systolic Congestive Heart Failure  - ACUTE ON CHRONIC Systolic CHF  - Other condition (please document in the progress notes and/or discharge summary)  - Cannot Clinically determine at this time      Reviewed: additional documentation in the medical record  Chronic diastolic and systolic heart failure.   Thank You,  Saul Fordyce  Clinical Documentation Specialist: [phone] Pager  Health Information Management [place]

## 2012-10-21 NOTE — Evaluation (Signed)
Physical Therapy Evaluation Patient Details Name: Vernon Briggs MRN: [id number] DOB: 1945-10-12 Today's Date: 10/21/2012 Time: 4098-1191 PT Time Calculation (min): 19 min  PT Assessment / Plan / Recommendation Clinical Impression  Pt is a 67 yo male presenting with L UE/LE intermittent numbness/tingling causing pt to have episodes of LOB during ambulation. Pt reports "I feel unsteady." Pt with good home set up and support. May benefit from outpatient PT to address balance deficits to improve safety with ambulation and decrease falls risk.    PT Assessment  Patient needs continued PT services    Follow Up Recommendations  Outpatient PT;Supervision - Intermittent    Does the patient have the potential to tolerate intense rehabilitation      Barriers to Discharge None      Equipment Recommendations  None recommended by PT    Recommendations for Other Services     Frequency Min 4X/week    Precautions / Restrictions Precautions Precautions: Fall Restrictions Weight Bearing Restrictions: No   Pertinent Vitals/Pain Denies pain      Mobility  Bed Mobility Bed Mobility: Not assessed Transfers Transfers: Sit to Stand;Stand to Sit Sit to Stand: 6: Modified independent (Device/Increase time);With upper extremity assist;From bed Stand to Sit: 6: Modified independent (Device/Increase time);With armrests;To chair/3-in-1 Details for Transfer Assistance: safe technique Ambulation/Gait Ambulation/Gait Assistance: 4: Min guard Ambulation Distance (Feet): 200 Feet Assistive device: None Ambulation/Gait Assistance Details: pt with progressive worsening L UE/LE numbness. Pt reports onset of SOB but reports this to be normal.  Gait Pattern: Step-through pattern Gait velocity: wfl General Gait Details: pt with occasional LOB to Left but pt able to recover independently Stairs: Yes Stairs Assistance: 4: Min guard Stair Management Technique: Two rails Number of Stairs: 6 Modified  Rankin (Stroke Patients Only) Pre-Morbid Rankin Score: No symptoms Modified Rankin: Slight disability    Exercises     PT Diagnosis: Difficulty walking  PT Problem List: Decreased balance;Decreased mobility;Impaired sensation PT Treatment Interventions: Gait training;Stair training;Functional mobility training;Balance training;Neuromuscular re-education   PT Goals Acute Rehab PT Goals PT Goal Formulation: With patient Time For Goal Achievement: 10/28/12 Potential to Achieve Goals: Good Pt will Ambulate: >150 feet;with modified independence (no device) PT Goal: Ambulate - Progress: Goal set today Pt will Go Up / Down Stairs: Flight;with modified independence;with rail(s) PT Goal: Up/Down Stairs - Progress: Goal set today Additional Goals Additional Goal #1: Pt to achieve > 52 on [place] to demonstrate decreased falls risk with higher level activities. PT Goal: Additional Goal #1 - Progress: Goal set today  Visit Information  Last PT Received On: 10/21/12 Assistance Needed: +1    Subjective Data  Subjective: Pt received sitting EOB with RN. Patient Stated Goal: home   Prior Functioning  Home Living Lives With: Spouse Available Help at Discharge: Family;Available 24 hours/day Type of Home: House Home Access: Stairs to enter [employer] of Steps: 2 Entrance Stairs-Rails: Left Home Layout: One level Bathroom Shower/Tub: Engineer, manufacturing systems: Standard Bathroom Accessibility: Yes How Accessible: Accessible via walker Home Adaptive Equipment: Walker - rolling;Hand-held shower hose Prior Function Level of Independence: Independent Able to Take Stairs?: Yes Driving: Yes Vocation: Retired Musician: No difficulties Dominant Hand: Left    Cognition  Cognition Overall Cognitive Status: Appears within functional limits for tasks assessed/performed Arousal/Alertness: Awake/alert Orientation Level: Oriented X4 / Intact Behavior During  Session: Vernon Briggs for tasks performed    Extremity/Trunk Assessment Right Upper Extremity Assessment RUE ROM/Strength/Tone: WFL for tasks assessed RUE Sensation: WFL - Light Touch RUE Coordination:  WFL - gross/fine motor Left Upper Extremity Assessment LUE ROM/Strength/Tone: WFL for tasks assessed LUE Sensation: Deficits LUE Sensation Deficits: pt with numbness/tingling in 3-5digits and medial forearm. pt reports "I just does it whenever it wants." LUE Coordination: WFL - gross/fine motor Right Lower Extremity Assessment RLE ROM/Strength/Tone: Within functional levels RLE Sensation: WFL - Light Touch RLE Coordination: WFL - gross/fine motor Left Lower Extremity Assessment LLE ROM/Strength/Tone: WFL for tasks assessed LLE Sensation: Deficits LLE Sensation Deficits: pt with numbness/tingling in 3-5 digits and medial lower leg. "It jjust does it whenever it wants." LLE Coordination: WFL - gross/fine motor Trunk Assessment Trunk Assessment: Normal   Balance Standardized Balance Assessment Standardized Balance Assessment: Dynamic Gait Index Dynamic Gait Index Level Surface: Mild Impairment Change in Gait Speed: Mild Impairment Gait with Horizontal Head Turns: Normal Gait with Vertical Head Turns: Normal Gait and Pivot Turn: Normal Step Over Obstacle: Mild Impairment Step Around Obstacles: Normal Steps: Mild Impairment Total Score: 20  End of Session PT - End of Session Equipment Utilized During Treatment: Gait belt Activity Tolerance: Patient tolerated treatment well Patient left: in chair (transportation came to take pt to doppler study) Nurse Communication: Mobility status  GP     Marcene Brawn 10/21/2012, 12:51 PM  Lewis Shock, PT, DPT Pager #: [phone] Office #: [phone]

## 2012-10-21 NOTE — Progress Notes (Signed)
Stroke Team Progress Note  HISTORY Vernon Briggs is an 67 y.o. 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 He said that the numbness occurs usually simultaneously and last for about 10 minutes and completely resolves. No face involvement and there is not associated weakness, headache, vertigo, double vision, difficulty swallowing, slurred speech, language or visual impairment. He is fully aware of his surroundings during such episodes. Feels " a little dizzy and oozy" at the time of the episode. The last episode occurred this morning. He gets his medical care at the [facility] hospital, called there today and was advised to come to [facility] ED for further evaluation. Takes aspirin and plavix religiously. CT brain in the ED showed no acute intracranial abnormality.Patient was not a TPA candidate secondary to delay in arrival. He was admitted for further evaluation and treatment.  SUBJECTIVE His wife and son are at the bedside.  Overall he feels his condition is stable.   OBJECTIVE Most recent Vital Signs: Filed Vitals:   10/21/12 0400 10/21/12 0600 10/21/12 0800 10/21/12 1000  BP: 140/68 165/90 165/79 159/76  Pulse: 60 59 70 67  Temp: 98 F (36.7 C) 98 F (36.7 C) 98.1 F (36.7 C) 98.2 F (36.8 C)  TempSrc: Oral Oral Oral Oral  Resp: 20 20 20 20   Height:      Weight:      SpO2: 99% 98% 100% 100%   CBG (last 3)   Recent Labs  10/20/12 2107 10/21/12 0632 10/21/12 1151  GLUCAP 134* 79 168*    IV Fluid Intake:     MEDICATIONS  . aspirin EC   81 mg Oral Daily  . atorvastatin  40 mg Oral Daily  . carvedilol  25 mg Oral BID WC  . clopidogrel  75 mg Oral Q breakfast  . FLUoxetine  20 mg Oral QHS  . hydrALAZINE  10 mg Oral TID  . insulin aspart  0-9 Units Subcutaneous TID WC  . insulin glargine  20 Units Subcutaneous QHS  . lisinopril  10 mg Oral Daily  . omega-3 acid ethyl esters  3 g Oral BID   PRN:  acetaminophen, acetaminophen, ondansetron (ZOFRAN) IV, senna-docusate  Diet:  Carb Control thin liquids Activity:  Bedrest with Bathroom privileges, OOB with assistance DVT Prophylaxis:  SCDs   CLINICALLY SIGNIFICANT STUDIES Basic Metabolic Panel:  Recent Labs Lab 10/20/12 1503 10/20/12 1633  NA 143 141  K 3.9 4.1  CL 105 107  CO2  --  24  GLUCOSE 59* 112*  BUN 25* 24*  CREATININE 1.10 0.96  CALCIUM  --  9.3   Liver Function Tests:  Recent Labs Lab 10/20/12 1633  AST 19  ALT 14  ALKPHOS 69  BILITOT 0.9  PROT 6.4  ALBUMIN 3.5   CBC:  Recent Labs Lab 10/20/12 1633 10/21/12 0445  WBC 8.7 7.6  NEUTROABS 6.7  --   HGB 13.2 12.9*  HCT 36.8* 37.2*  MCV 91.3 91.6  PLT 81* 74*   Coagulation: No results found for this basename: LABPROT, INR,  in the last  168 hours Cardiac Enzymes:  Recent Labs Lab 10/20/12 1630  TROPONINI <0.30   Urinalysis:  Recent Labs Lab 10/21/12 0254  COLORURINE YELLOW  LABSPEC 1.024  PHURINE 5.0  GLUCOSEU NEGATIVE  HGBUR NEGATIVE  BILIRUBINUR NEGATIVE  KETONESUR NEGATIVE  PROTEINUR 100*  UROBILINOGEN 0.2  NITRITE NEGATIVE  LEUKOCYTESUR NEGATIVE   Lipid Panel    Component Value Date/Time   CHOL 75 10/21/2012 0445   TRIG 85 10/21/2012 0445   HDL 27* 10/21/2012 0445   CHOLHDL 2.8 10/21/2012 0445   VLDL 17 10/21/2012 0445   LDLCALC 31 10/21/2012 0445   HgbA1C  Lab Results  Component Value Date   HGBA1C 5.9* 10/21/2012    Urine Drug Screen:   No results found for this basename: labopia, cocainscrnur, labbenz, amphetmu, thcu, labbarb    Alcohol Level: No results  found for this basename: ETH,  in the last 168 hours   CT of the brain  10/20/2012  Old left frontal infarct.  No acute infarction.  Chronic microvascular ischemic changes throughout the deep white matter.     MRI/A of the brain  pacer  2D Echocardiogram    Carotid Doppler  No evidence of hemodynamically significant internal carotid artery stenosis. Vertebral artery flow is antegrade.   CXR  10/20/2012   Cardiomegaly.  No acute cardiopulmonary disease.      EKG  sinus bradycardia, 1st degree AV block.   Therapy Recommendations OP PT  Physical Exam   Pleasant middle aged Caucasian male not in distress.Awake alert. Afebrile. Head is nontraumatic. Neck is supple without bruit. Hearing is normal. Cardiac exam no murmur or gallop. Lungs are clear to auscultation. Distal pulses are well felt Neurological Exam : .Awake  Alert oriented x 3. Normal speech and language.eye movements full without nystagmus. Face symmetric. Tongue midline. Normal strength, tone, reflexes and coordination.Decrease left hemibody sensation. Gait deferred. ASSESSMENT [name] is a 67 y.o. male presenting with left body numbness. Initial CT imaging confirms no acute infarct. Unable to have MRI secondary to pacer. ? Infarct vs TIA vs other etiology. On aspirin 81 mg orally every day and clopidogrel 75 mg orally every day prior to admission. Now on aspirin 81 mg orally every day and clopidogrel 75 mg orally every day for secondary stroke prevention. Patient with resultant intermittent numbness. Work up underway.  Hypertension Hyperlipidemia, LDL 31, on statin PTA, on statin now, at goal LDL < 100 Diabetes, HgbA1c 5.9 CAD s/p CABG and stents x 6 CHF s/p ICD placment Hx left frontal stroke without residual defitis  Hospital day # 1  TREATMENT/PLAN  Continue aspirin 81 mg orally every day and clopidogrel 75 mg orally every day for secondary stroke prevention.  Therapy evals  F/u 2D  CTA brain to  confirm/refute stroke and look at vasculature  Anticipate ok for discharge home in am  [facility], MSN, RN, ANVP-BC, ANP-BC, GNP-BC [facility] Pager: [phone] 10/21/2012 2:30 PM  I have personally obtained a history, examined the patient, evaluated imaging results, and formulated the assessment and plan of care. I agree with the above.  Delia Heady, MD Medical Director [facility] Pager: [phone] 10/21/2012 5:03 PM

## 2012-10-21 NOTE — Progress Notes (Signed)
VASCULAR LAB PRELIMINARY  PRELIMINARY  PRELIMINARY  PRELIMINARY  Carotid duplex completed.    Preliminary report:  Bilateral:  No evidence of hemodynamically significant internal carotid artery stenosis.   Vertebral artery flow is antegrade.     Vernon Briggs, RVS 10/21/2012, 1:10 PM

## 2012-10-21 NOTE — Progress Notes (Signed)
OT Cancellation Note  Patient Details Name: Vernon Briggs MRN: [id number] DOB: 02-07-1946   Cancelled Treatment:    Reason Eval/Treat Not Completed: Patient at procedure or test/ unavailable.  [facility] reattempt tomorrow  Jeani Hawking M 10/21/2012, 4:25 PM

## 2012-10-21 NOTE — Progress Notes (Signed)
*  PRELIMINARY RESULTS* Echocardiogram 2D Echocardiogram has been performed.  Vernon Briggs 10/21/2012, 3:01 PM

## 2012-10-22 LAB — CBC
MCH: 32.5 pg (ref 26.0–34.0)
MCV: 91.9 fL (ref 78.0–100.0)
Platelets: 84 10*3/uL — ABNORMAL LOW (ref 150–400)
RDW: 13.8 % (ref 11.5–15.5)

## 2012-10-22 LAB — GLUCOSE, CAPILLARY: Glucose-Capillary: 124 mg/dL — ABNORMAL HIGH (ref 70–99)

## 2012-10-22 NOTE — Discharge Summary (Addendum)
Physician Discharge Summary  Vernon Briggs ZOX:[id number] DOB: [birth year] DOA: 10/20/2012  PCP: Default, Provider, MD  Admit date: 10/20/2012 Discharge date: 10/22/2012  Time spent: 40 minutes  Recommendations for Outpatient Follow-up:  1. [facility].   Discharge Diagnoses:  Principal Problem:   TIA (transient ischemic attack) Active Problems:   History of CVA (cerebrovascular accident)   CAD (coronary artery disease)   Hx of CABG   ICD (implantable cardiac defibrillator) in place   Diabetes mellitus   Hyperlipidemia   Hypertension   Thrombocytopenia  Discharge Condition: stable  Diet recommendation: heart healthy  Filed Weights   10/20/12 2200  Weight: 108.3 kg (238 lb 12.1 oz)   History of present illness:  This is a 67 year old male with a past medical history of coronary artery disease status post previous heart attacks, and per patient, 6 stents placed as well as a bypass surgery, and an ICD placed last year at the [facility] for - per patient - low function of the heart pump. He denies any previous arrhythmias. He also has a history of a previous CVA, left frontal, with right-sided weakness that has resolved with physical therapy. He currently denies any motor deficits on the right. Has a history of hyperlipidemia and is on statin. Has a history of diabetes, insulin-dependent, without complications - per patient. He denies any numbness from the diabetes, he is followed regularly by an ophthalmologist, and he is unaware of any renal disease. He presents today with a chief complaint of numbness on his left hand and forearm (mainly the fifth digit) as well as numbness of the entire left lower leg and up to the distal thigh. He states that these episodes come and go without any precipitating factors. Usually last about 10-15 minutes. He is also feeling weak and "woozy". Episodes usually resolve on their own. He has no abdominal complaints,  denies any nausea, vomiting or diarrhea. He denies any fever or chills. He denies any syncopal episodes, or episodes of confusion with his numbness. He is fully functional at home. He denies any history of peptic ulcer disease or GI bleed   Hospital Course:  Patient was admitted chief concern for CVA. Neurology services were consulted. He underwent a regular stroke workup. He cannot do an MRI since patient has an ICD placed last year at the [facility]. 2-D echocardiogram showed an EF of 40-45%, as well as moderate concentric hypertrophy in his left ventricle. Carotid duplex showed no evidence of hemodynamically significant internal carotid artery stenosis. Patient underwent CT angiography of the head showed no significant significant stenosis or occlusions, and no evidence of acute or subacute cortical infarct. It did show the remote anterior left frontal lobe infarct consistent with the patient's history. Patient was discharged to continue his aspirin and Plavix, as well as his previous home medications. His hemoglobin A1c was 5.9, and no changes were made in his diabetic medication. Patient had mild thrombocytopenia while in the hospital, and previous records showed that this is somewhat chronic. I recommend followup CBC he sees his doctor at the [place].   Procedures:  2D echo Study Conclusions - Left ventricle: The cavity size was normal. There was moderate concentric hypertrophy. Systolic function was mildly to moderately reduced. The estimated ejection fraction was in the range of 40% to 45%. There is akinesis of the mid-distalanteroseptal myocardium. - Mitral valve: Mild regurgitation. - Left atrium: The atrium was mildly dilated. - Tricuspid valve: Moderate regurgitation. -  Pulmonary arteries: PA peak pressure: 35mm Hg (S).  Consultations:  Neurology  Discharge Exam: Filed Vitals:   10/21/12 1800 10/21/12 2215 10/22/12 0532 10/22/12 0800  BP: 164/83 159/71 162/74 152/75  Pulse: 74 62  61 67  Temp: 98.2 F (36.8 C) 98 F (36.7 C) 97.9 F (36.6 C)   TempSrc: Oral Oral Oral   Resp: 18 20 18 20   Height:      Weight:      SpO2: 99% 99% 97% 99%   General: NAD Cardiovascular: RRR without MRG Respiratory: CTA biL Neuro: No focal findings, sensation intact, CN2-12 grossly intact  Discharge Instructions    Medication List    TAKE these medications       aspirin EC 81 MG tablet  Take 81 mg by mouth daily.     atorvastatin 80 MG tablet  Commonly known as:  LIPITOR  Take 40 mg by mouth daily.     carvedilol 25 MG tablet  Commonly known as:  COREG  Take 25 mg by mouth 2 (two) times daily with a meal.     clopidogrel 75 MG tablet  Commonly known as:  PLAVIX  Take 75 mg by mouth daily.     cyanocobalamin 1000 MCG/ML injection  Commonly known as:  (VITAMIN B-12)  Inject 1,000 mcg into the muscle every 30 (thirty) days.     FLUoxetine 20 MG capsule  Commonly known as:  PROZAC  Take 20 mg by mouth at bedtime.     glyBURIDE 5 MG tablet  Commonly known as:  DIABETA  Take 10 mg by mouth 2 (two) times daily.     hydrALAZINE 10 MG tablet  Commonly known as:  APRESOLINE  Take 20 mg by mouth 3 (three) times daily.     insulin glargine 100 UNIT/ML injection  Commonly known as:  LANTUS  Inject 30 Units into the skin at bedtime.     lisinopril 40 MG tablet  Commonly known as:  PRINIVIL,ZESTRIL  Take 40 mg by mouth daily.     metFORMIN 500 MG tablet  Commonly known as:  GLUCOPHAGE  Take 500 mg by mouth 2 (two) times daily with a meal.     omega-3 acid ethyl esters 1 G capsule  Commonly known as:  LOVAZA  Take 3 g by mouth 2 (two) times daily.     spironolactone 25 MG tablet  Commonly known as:  ALDACTONE  Take 25 mg by mouth daily.        The results of significant diagnostics from this hospitalization (including imaging, microbiology, ancillary and laboratory) are listed below for reference.    Significant Diagnostic Studies: Ct Angio Head W/cm  &/or Wo Cm  10/21/2012  *RADIOLOGY REPORT*  Clinical Data:  Left-sided extremity weakness and tingling. Evaluate for stroke.  CT ANGIOGRAPHY HEAD  Technique:  Multidetector CT imaging of the head was performed using the standard protocol during bolus administration of intravenous contrast.  Multiplanar CT image reconstructions including MIPs were obtained to evaluate the vascular anatomy.  Contrast: OMNIPAQUE IOHEXOL 350 MG/ML SOLN  Comparison:  CT without contrast 10/20/2012.  Findings:  The remote anterior left frontal lobe infarct is stable. Source images demonstrate no evidence for acute cortical infarct. There is no hemorrhage or mass lesion.  Scattered subcortical white matter changes are stable.  No significant extra-axial fluid collection is present.  No focal right-sided infarct is evident to explain the patient's symptoms.  Atherosclerotic calcifications are present within the cavernous carotid arteries bilaterally.  The ICA terminus is within normal limits.  The A1 and M1 segments are normal.  The MCA bifurcations are within normal limits bilaterally.  Mild segmental attenuation is present within the MCA and ACA branch vessels without significant proximal stenosis or occlusion.  The PICA origins are visualized and within normal limits.  Minimal left vertebral artery calcification is present at the dural margin. The posterior cerebral arteries both originate from basilar tip. The distal PCA branch vessels demonstrate some irregularity without a significant stenosis or aneurysm.  The paranasal sinuses and mastoid air cells are clear.  The osseous skull is intact.   Review of the MIP images confirms the above findings.  IMPRESSION:  1.  No evidence for significant stenosis or occlusion on the right. 2.  No evidence for acute or subacute right-sided cortical infarct. 3.  Remote anterior left frontal lobe infarct. 4.  The atherosclerotic calcifications within the cavernous carotid arteries without a  significant proximal stenosis, aneurysm, or branch vessel occlusion.   Original Report Authenticated By: Marin Roberts, M.D.    Dg Chest 2 View  10/20/2012  *RADIOLOGY REPORT*  Clinical Data: Left-sided numbness, tingling.  CHEST - 2 VIEW  Comparison: None  Findings: Left AICD is in place with leads in the right atrium and right ventricle.  Prior CABG.  Cardiomegaly.  No focal airspace opacities or effusions.  No acute bony abnormality.  IMPRESSION: Cardiomegaly.  No acute cardiopulmonary disease.   Original Report Authenticated By: Charlett Nose, M.D.    Ct Head Wo Contrast  10/20/2012  *RADIOLOGY REPORT*  Clinical Data: Left-sided numbness.  CT HEAD WITHOUT CONTRAST  Technique:  Contiguous axial images were obtained from the base of the skull through the vertex without contrast.  Comparison: None.  Findings: Area of low density in the anterior left frontal lobe compatible with old infarct.  Mild chronic microvascular changes in the deep white matter.  No evidence for acute infarction.  No hemorrhage or hydrocephalus.  No midline shift.  No acute calvarial abnormality. Visualized paranasal sinuses and mastoids clear. Orbital soft tissues unremarkable.  IMPRESSION: Old left frontal infarct.  No acute infarction.  Chronic microvascular ischemic changes throughout the deep white matter.   Original Report Authenticated By: Charlett Nose, M.D.    Labs: Basic Metabolic Panel:  Recent Labs Lab 10/20/12 1503 10/20/12 1633  NA 143 141  K 3.9 4.1  CL 105 107  CO2  --  24  GLUCOSE 59* 112*  BUN 25* 24*  CREATININE 1.10 0.96  CALCIUM  --  9.3   Liver Function Tests:  Recent Labs Lab 10/20/12 1633  AST 19  ALT 14  ALKPHOS 69  BILITOT 0.9  PROT 6.4  ALBUMIN 3.5   CBC:  Recent Labs Lab 10/20/12 1503 10/20/12 1633 10/21/12 0445 10/22/12 0640  WBC  --  8.7 7.6 8.2  NEUTROABS  --  6.7  --   --   HGB 12.9* 13.2 12.9* 13.2  HCT 38.0* 36.8* 37.2* 37.3*  MCV  --  91.3 91.6 91.9  PLT  --   81* 74* 84*   Cardiac Enzymes:  Recent Labs Lab 10/20/12 1630  TROPONINI <0.30   CBG:  Recent Labs Lab 10/20/12 2107 10/21/12 0632 10/21/12 1151 10/21/12 2210 10/22/12 0645  GLUCAP 134* 79 168* 130* 124*     Signed:  GHERGHE, COSTIN  [facility] 10/22/2012, 9:38 AM

## 2012-10-22 NOTE — Progress Notes (Signed)
Stroke Team Progress Note  HISTORY Vernon Briggs is an 67 y.o. 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 He said that the numbness occurs usually simultaneously and last for about 10 minutes and completely resolves. No face involvement and there is not associated weakness, headache, vertigo, double vision, difficulty swallowing, slurred speech, language or visual impairment. He is fully aware of his surroundings during such episodes. Feels " a little dizzy and oozy" at the time of the episode. The last episode occurred this morning. He gets his medical care at the [facility] hospital, called there today and was advised to come to [facility] ED for further evaluation. Takes aspirin and plavix religiously. CT brain in the ED showed no acute intracranial abnormality.Patient was not a TPA candidate secondary to delay in arrival. He was admitted for further evaluation and treatment.  SUBJECTIVE His wife is at the bedside.  Overall he feels his condition is stable. He is ready for discharge. His wife's daughter died in [place] 1 year ago with a brain hemorrhage, Dr. Pearlean Brownie was her daughter's MD. They discussed the circumstances. DR. Pearlean Brownie hugged her for comfort.  OBJECTIVE Most recent Vital Signs: Filed Vitals:   10/21/12 2215 10/22/12 0532 10/22/12 0800 10/22/12 0939  BP: 159/71 162/74 152/75 138/87  Pulse: 62 61 67 62  Temp: 98 F (36.7 C) 97.9 F (36.6 C)  97.6 F (36.4 C)  TempSrc: Oral Oral  Oral  Resp: 20 18 20 18   Height:      Weight:      SpO2: 99% 97%  99% 100%   CBG (last 3)   Recent Labs  10/21/12 1151 10/21/12 2210 10/22/12 0645  GLUCAP 168* 130* 124*    IV Fluid Intake:     MEDICATIONS  . aspirin EC  81 mg Oral Daily  . atorvastatin  40 mg Oral Daily  . carvedilol  25 mg Oral BID WC  . clopidogrel  75 mg Oral Q breakfast  . FLUoxetine  20 mg Oral QHS  . hydrALAZINE  10 mg Oral TID  . insulin aspart  0-9 Units Subcutaneous TID WC  . insulin glargine  20 Units Subcutaneous QHS  . lisinopril  10 mg Oral Daily  . omega-3 acid ethyl esters  3 g Oral BID   PRN:  acetaminophen, acetaminophen, ondansetron (ZOFRAN) IV, senna-docusate  Diet:  Carb Control thin liquids Activity:  OOB with assistance DVT Prophylaxis:  SCDs   CLINICALLY SIGNIFICANT STUDIES Basic Metabolic Panel:   Recent Labs Lab 10/20/12 1503 10/20/12 1633  NA 143 141  K 3.9 4.1  CL 105 107  CO2  --  24  GLUCOSE 59* 112*  BUN 25* 24*  CREATININE 1.10 0.96  CALCIUM  --  9.3   Liver Function Tests:   Recent Labs Lab 10/20/12 1633  AST 19  ALT 14  ALKPHOS 69  BILITOT 0.9  PROT 6.4  ALBUMIN 3.5   CBC:   Recent Labs Lab 10/20/12 1633 10/21/12 0445 10/22/12 0640  WBC 8.7 7.6 8.2  NEUTROABS 6.7  --   --  HGB 13.2 12.9* 13.2  HCT 36.8* 37.2* 37.3*  MCV 91.3 91.6 91.9  PLT 81* 74* 84*   Coagulation: No results found for this basename: LABPROT, INR,  in the last 168 hours Cardiac Enzymes:   Recent Labs Lab 10/20/12 1630  TROPONINI <0.30   Urinalysis:   Recent Labs Lab 10/21/12 0254  COLORURINE YELLOW  LABSPEC 1.024  PHURINE 5.0  GLUCOSEU NEGATIVE  HGBUR NEGATIVE  BILIRUBINUR NEGATIVE  KETONESUR NEGATIVE  PROTEINUR 100*  UROBILINOGEN 0.2  NITRITE NEGATIVE  LEUKOCYTESUR NEGATIVE   Lipid Panel    Component Value Date/Time   CHOL 75 10/21/2012 0445   TRIG 85 10/21/2012 0445   HDL 27* 10/21/2012 0445   CHOLHDL 2.8 10/21/2012 0445   VLDL 17 10/21/2012 0445   LDLCALC 31 10/21/2012 0445   HgbA1C  Lab Results  Component  Value Date   HGBA1C 5.9* 10/21/2012   Urine Drug Screen:   No results found for this basename: labopia,  cocainscrnur,  labbenz,  amphetmu,  thcu,  labbarb    Alcohol Level: No results found for this basename: ETH,  in the last 168 hours  CT of the brain  10/20/2012  Old left frontal infarct.  No acute infarction.  Chronic microvascular ischemic changes throughout the deep white matter.     CT angio of the brain  1. No evidence for significant stenosis or occlusion on the right.  2. No evidence for acute or subacute right-sided cortical infarct.  3. Remote anterior left frontal lobe infarct.  4. The atherosclerotic calcifications within the cavernous carotid arteries without a significant proximal stenosis, aneurysm, or branch vessel occlusion.  MRI/A of the brain  pacer  2D Echocardiogram  The estimated ejection fraction was in the range of 40% to 45%. There is akinesis of the mid-distalanteroseptal myocardium.  Carotid Doppler  No evidence of hemodynamically significant internal carotid artery stenosis. Vertebral artery flow is antegrade.   CXR  10/20/2012   Cardiomegaly.  No acute cardiopulmonary disease.     EKG  sinus bradycardia, 1st degree AV block.   Therapy Recommendations OP PT  Physical Exam   Pleasant middle aged Caucasian male not in distress.Awake alert. Afebrile. Head is nontraumatic. Neck is supple without bruit. Hearing is normal. Cardiac exam no murmur or gallop. Lungs are clear to auscultation. Distal pulses are well felt Neurological Exam : .Awake  Alert oriented x 3. Normal speech and language.eye movements full without nystagmus. Face symmetric. Tongue midline. Normal strength, tone, reflexes and coordination.Decrease left hemibody sensation. Gait deferred.  ASSESSMENT [name] is a 67 y.o. male presenting with left body numbness. Initial CT imaging confirms no acute infarct. Unable to have MRI secondary to pacer. Repeat CT unrevealing for new stroke or  vascular abnormality. Dx: right brain TIA. On aspirin 81 mg orally every day and clopidogrel 75 mg orally every day prior to admission. Now on aspirin 81 mg orally every day and clopidogrel 75 mg orally every day for secondary stroke prevention. Patient with resultant intermittent numbness. Work up completed. Risk of stroke 10-15% over the next 1 year.  Hypertension Hyperlipidemia, LDL 31, on statin PTA, on statin now, at goal LDL < 100 Diabetes, HgbA1c 5.9 CAD s/p CABG and stents x 6 CHF s/p ICD placment Hx left frontal stroke without residual defitis  Hospital day # 2  TREATMENT/PLAN  Continue aspirin 81 mg orally every day and clopidogrel 75 mg orally every day for secondary stroke prevention.  OP PT  OK for discharge  home Stroke Service will sign off. Please call should any needs arise. Follow up with Dr. Pearlean Brownie, Stroke Clinic, in 2 months.   Annie Main, MSN, RN, ANVP-BC, ANP-BC, [facility] Pager: [phone] 10/22/2012 11:10 AM  I have personally obtained a history, examined the patient, evaluated imaging results, and formulated the assessment and plan of care. I agree with the above.  Delia Heady, MD Medical Director [facility] Pager: [phone] 10/22/2012 11:10 AM

## 2014-09-08 ENCOUNTER — Encounter (HOSPITAL_COMMUNITY): Payer: Self-pay | Admitting: Emergency Medicine

## 2014-09-08 ENCOUNTER — Inpatient Hospital Stay (HOSPITAL_COMMUNITY)
Admission: EM | Admit: 2014-09-08 | Discharge: 2014-09-15 | DRG: 247 | Disposition: A | Discharge status: Home / Self Care | Payer: Non-veteran care | Attending: Cardiology | Admitting: Cardiology

## 2014-09-08 DIAGNOSIS — D696 Thrombocytopenia, unspecified: Secondary | ICD-10-CM | POA: Diagnosis present

## 2014-09-08 DIAGNOSIS — I5022 Chronic systolic (congestive) heart failure: Secondary | ICD-10-CM | POA: Diagnosis present

## 2014-09-08 DIAGNOSIS — E785 Hyperlipidemia, unspecified: Secondary | ICD-10-CM | POA: Diagnosis present

## 2014-09-08 DIAGNOSIS — Z951 Presence of aortocoronary bypass graft: Secondary | ICD-10-CM

## 2014-09-08 DIAGNOSIS — I249 Acute ischemic heart disease, unspecified: Secondary | ICD-10-CM | POA: Diagnosis not present

## 2014-09-08 DIAGNOSIS — F4310 Post-traumatic stress disorder, unspecified: Secondary | ICD-10-CM | POA: Diagnosis present

## 2014-09-08 DIAGNOSIS — I480 Paroxysmal atrial fibrillation: Secondary | ICD-10-CM | POA: Diagnosis present

## 2014-09-08 DIAGNOSIS — Z9861 Coronary angioplasty status: Secondary | ICD-10-CM

## 2014-09-08 DIAGNOSIS — Z794 Long term (current) use of insulin: Secondary | ICD-10-CM

## 2014-09-08 DIAGNOSIS — Z7901 Long term (current) use of anticoagulants: Secondary | ICD-10-CM

## 2014-09-08 DIAGNOSIS — I4891 Unspecified atrial fibrillation: Secondary | ICD-10-CM | POA: Diagnosis present

## 2014-09-08 DIAGNOSIS — I160 Hypertensive urgency: Secondary | ICD-10-CM | POA: Diagnosis present

## 2014-09-08 DIAGNOSIS — R791 Abnormal coagulation profile: Secondary | ICD-10-CM

## 2014-09-08 DIAGNOSIS — Z7902 Long term (current) use of antithrombotics/antiplatelets: Secondary | ICD-10-CM

## 2014-09-08 DIAGNOSIS — I2510 Atherosclerotic heart disease of native coronary artery without angina pectoris: Secondary | ICD-10-CM

## 2014-09-08 DIAGNOSIS — I10 Essential (primary) hypertension: Secondary | ICD-10-CM | POA: Diagnosis present

## 2014-09-08 DIAGNOSIS — Z95810 Presence of automatic (implantable) cardiac defibrillator: Secondary | ICD-10-CM | POA: Diagnosis present

## 2014-09-08 DIAGNOSIS — N289 Disorder of kidney and ureter, unspecified: Secondary | ICD-10-CM

## 2014-09-08 DIAGNOSIS — Z7982 Long term (current) use of aspirin: Secondary | ICD-10-CM

## 2014-09-08 DIAGNOSIS — E118 Type 2 diabetes mellitus with unspecified complications: Secondary | ICD-10-CM | POA: Diagnosis present

## 2014-09-08 DIAGNOSIS — R9439 Abnormal result of other cardiovascular function study: Secondary | ICD-10-CM | POA: Diagnosis not present

## 2014-09-08 DIAGNOSIS — Z955 Presence of coronary angioplasty implant and graft: Secondary | ICD-10-CM

## 2014-09-08 DIAGNOSIS — R778 Other specified abnormalities of plasma proteins: Secondary | ICD-10-CM | POA: Diagnosis present

## 2014-09-08 DIAGNOSIS — Z8673 Personal history of transient ischemic attack (TIA), and cerebral infarction without residual deficits: Secondary | ICD-10-CM

## 2014-09-08 DIAGNOSIS — I255 Ischemic cardiomyopathy: Secondary | ICD-10-CM | POA: Diagnosis present

## 2014-09-08 DIAGNOSIS — G4733 Obstructive sleep apnea (adult) (pediatric): Secondary | ICD-10-CM | POA: Diagnosis present

## 2014-09-08 DIAGNOSIS — I252 Old myocardial infarction: Secondary | ICD-10-CM

## 2014-09-08 DIAGNOSIS — R7989 Other specified abnormal findings of blood chemistry: Secondary | ICD-10-CM | POA: Diagnosis present

## 2014-09-08 HISTORY — DX: Atherosclerotic heart disease of native coronary artery without angina pectoris: I25.10

## 2014-09-08 LAB — CBC
HEMATOCRIT: 39.2 % (ref 39.0–52.0)
HEMOGLOBIN: 13.8 g/dL (ref 13.0–17.0)
MCH: 33.4 pg (ref 26.0–34.0)
MCHC: 35.2 g/dL (ref 30.0–36.0)
MCV: 94.9 fL (ref 78.0–100.0)
Platelets: 99 10*3/uL — ABNORMAL LOW (ref 150–400)
RBC: 4.13 MIL/uL — AB (ref 4.22–5.81)
RDW: 14.5 % (ref 11.5–15.5)
WBC: 7.7 10*3/uL (ref 4.0–10.5)

## 2014-09-08 LAB — COMPREHENSIVE METABOLIC PANEL
ALBUMIN: 3.5 g/dL (ref 3.5–5.2)
ALK PHOS: 65 U/L (ref 39–117)
ALT: 34 U/L (ref 0–53)
ANION GAP: 9 (ref 5–15)
AST: 36 U/L (ref 0–37)
BUN: 29 mg/dL — AB (ref 6–23)
CO2: 25 mmol/L (ref 19–32)
CREATININE: 1.42 mg/dL — AB (ref 0.50–1.35)
Calcium: 9 mg/dL (ref 8.4–10.5)
Chloride: 104 mEq/L (ref 96–112)
GFR calc Af Amer: 56 mL/min — ABNORMAL LOW (ref >90)
GFR calc non Af Amer: 49 mL/min — ABNORMAL LOW (ref >90)
Glucose, Bld: 169 mg/dL — ABNORMAL HIGH (ref 70–99)
Potassium: 4.1 mmol/L (ref 3.5–5.1)
Sodium: 138 mmol/L (ref 135–145)
TOTAL PROTEIN: 5.9 g/dL — AB (ref 6.0–8.3)
Total Bilirubin: 0.8 mg/dL (ref 0.3–1.2)

## 2014-09-08 LAB — PROTIME-INR
INR: 1.68 — AB (ref 0.00–1.49)
Prothrombin Time: 20 seconds — ABNORMAL HIGH (ref 11.6–15.2)

## 2014-09-08 LAB — TROPONIN I: TROPONIN I: 0.05 ng/mL — AB (ref <0.031)

## 2014-09-08 NOTE — ED Notes (Signed)
Patient not feeling well today, patient has been hypertensive all day long.  Patient's highest pressure was 240/120.  Patient now is 202/110.  Patient not complaining of any headache but he is having some pain in right arm.  Patient with Afib history.

## 2014-09-08 NOTE — ED Notes (Addendum)
Patient took: Warfarin 7.5mg , Fish Oil 1000mg  x3, Lisinopril 40mg , Carvedilol 12.5mg , Isosorbide dinitrate 5mg , Rosuvastatin Ca 10mg , Metformin 500mg , Magnesium Oxide 400mg , and Fluoxetine 20mg  via Wife.

## 2014-09-08 NOTE — ED Provider Notes (Addendum)
CSN: [id number]     Arrival date & time 09/08/14  2023 History   First MD Initiated Contact with Patient 09/08/14 2048     Chief Complaint  Patient presents with  . Hypertension     (Consider location/radiation/quality/duration/timing/severity/associated sxs/prior Treatment) Patient is a 69 y.o. male presenting with hypertension. The history is provided by the patient and the spouse.  Hypertension Pertinent negatives include no chest pain, no abdominal pain, no headaches and no shortness of breath.  pt with hx iddm, htn, cad, states his bp has been high today, as high as 240/120 on home monitor.  States he went to see his doctors at the [place] today for coumadin check - had coumadin dose adjusted, states bp was high then.  States went home, and this evening noted a dull pain to right upper arm, so checked bp and his was even higher, states checked a few more times and was in range 200-220/92-100.  States he has had similar arm pain when bp is high in past. Denies any chest pain or discomfort. No sob or unusual doe. No headaches. No numbness/weakness. No change in speech or vision.       Past Medical History  Diagnosis Date  . Hypertension   . Diabetes mellitus without complication   . Stroke   . CHF (congestive heart failure)   . Coronary artery disease   . Myocardial infarction    Past Surgical History  Procedure Laterality Date  . Coronary artery bypass graft    . Heart stent      Dr. Ronnald Ramp in [place], [place]  . Cardiac defibrillator placement      [place]   No family history on file. History  Substance Use Topics  . Smoking status: Never Smoker   . Smokeless tobacco: Not on file  . Alcohol Use: Yes     Comment: seldom    Review of Systems  Constitutional: Negative for fever.  HENT: Negative for sore throat.   Eyes: Negative for redness.  Respiratory: Negative for cough and shortness of breath.   Cardiovascular: Negative for chest pain.  Gastrointestinal: Negative for  vomiting, abdominal pain and diarrhea.  Genitourinary: Negative for flank pain.  Musculoskeletal: Negative for back pain and neck pain.  Skin: Negative for rash.  Neurological: Negative for weakness, numbness and headaches.  Hematological: Does not bruise/bleed easily.  Psychiatric/Behavioral: Negative for confusion.      Allergies  Review of patient's allergies indicates no known allergies.  Home Medications   Prior to Admission medications   Medication Sig Start Date End Date Taking? Authorizing Provider  aspirin EC 81 MG tablet Take 81 mg by mouth daily.    Historical Provider, MD  atorvastatin (LIPITOR) 80 MG tablet Take 40 mg by mouth daily.    Historical Provider, MD  carvedilol (COREG) 25 MG tablet Take 25 mg by mouth 2 (two) times daily with a meal.    Historical Provider, MD  clopidogrel (PLAVIX) 75 MG tablet Take 75 mg by mouth daily.    Historical Provider, MD  cyanocobalamin (,VITAMIN B-12,) 1000 MCG/ML injection Inject 1,000 mcg into the muscle every 30 (thirty) days.    Historical Provider, MD  FLUoxetine (PROZAC) 20 MG capsule Take 20 mg by mouth at bedtime.    Historical Provider, MD  glyBURIDE (DIABETA) 5 MG tablet Take 10 mg by mouth 2 (two) times daily.    Historical Provider, MD  hydrALAZINE (APRESOLINE) 10 MG tablet Take 20 mg by mouth 3 (three) times daily.  Historical Provider, MD  insulin glargine (LANTUS) 100 UNIT/ML injection Inject 30 Units into the skin at bedtime.    Historical Provider, MD  lisinopril (PRINIVIL,ZESTRIL) 40 MG tablet Take 40 mg by mouth daily.    Historical Provider, MD  metFORMIN (GLUCOPHAGE) 500 MG tablet Take 500 mg by mouth 2 (two) times daily with a meal.    Historical Provider, MD  omega-3 acid ethyl esters (LOVAZA) 1 G capsule Take 3 g by mouth 2 (two) times daily.    Historical Provider, MD  spironolactone (ALDACTONE) 25 MG tablet Take 25 mg by mouth daily.    Historical Provider, MD   BP 203/92 mmHg  Pulse 69  Temp(Src) 98.1  F (36.7 C) (Oral)  Resp 18  Ht 6' (1.829 m)  Wt 260 lb (117.935 kg)  BMI 35.25 kg/m2  SpO2 97% Physical Exam  Constitutional: He is oriented to person, place, and time. He appears well-developed and well-nourished. No distress.  HENT:  Mouth/Throat: Oropharynx is clear and moist.  Eyes: Conjunctivae are normal. No scleral icterus.  Neck: Neck supple. No tracheal deviation present.  Cardiovascular: Normal rate, regular rhythm, normal heart sounds and intact distal pulses.   Pulmonary/Chest: Effort normal and breath sounds normal. No accessory muscle usage. No respiratory distress.  Abdominal: Soft. Bowel sounds are normal. He exhibits no distension. There is no tenderness.  Musculoskeletal: Normal range of motion. He exhibits no edema or tenderness.  Neurological: He is alert and oriented to person, place, and time.  Motor intact bil. sens grossly intact.   Skin: Skin is warm and dry. He is not diaphoretic.  Psychiatric: He has a normal mood and affect.  Nursing note and vitals reviewed.   ED Course  Procedures (including critical care time) Labs Review  Results for orders placed or performed during the hospital encounter of 09/08/14  CBC  Result Value Ref Range   WBC 7.7 4.0 - 10.5 K/uL   RBC 4.13 (L) 4.22 - 5.81 MIL/uL   Hemoglobin 13.8 13.0 - 17.0 g/dL   HCT 39.2 39.0 - 52.0 %   MCV 94.9 78.0 - 100.0 fL   MCH 33.4 26.0 - 34.0 pg   MCHC 35.2 30.0 - 36.0 g/dL   RDW 14.5 11.5 - 15.5 %   Platelets 99 (L) 150 - 400 K/uL  Comprehensive metabolic panel  Result Value Ref Range   Sodium 138 135 - 145 mmol/L   Potassium 4.1 3.5 - 5.1 mmol/L   Chloride 104 96 - 112 mEq/L   CO2 25 19 - 32 mmol/L   Glucose, Bld 169 (H) 70 - 99 mg/dL   BUN 29 (H) 6 - 23 mg/dL   Creatinine, Ser 1.42 (H) 0.50 - 1.35 mg/dL   Calcium 9.0 8.4 - 10.5 mg/dL   Total Protein 5.9 (L) 6.0 - 8.3 g/dL   Albumin 3.5 3.5 - 5.2 g/dL   AST 36 0 - 37 U/L   ALT 34 0 - 53 U/L   Alkaline Phosphatase 65 39 - 117  U/L   Total Bilirubin 0.8 0.3 - 1.2 mg/dL   GFR calc non Af Amer 49 (L) >90 mL/min   GFR calc Af Amer 56 (L) >90 mL/min   Anion gap 9 5 - 15  Troponin I  Result Value Ref Range   Troponin I 0.05 (H) <0.031 ng/mL  Protime-INR  Result Value Ref Range   Prothrombin Time 20.0 (H) 11.6 - 15.2 seconds   INR 1.68 (H) 0.00 - 1.49  EKG Interpretation   Date/Time:  Tuesday September 08 2014 20:39:05 EST Ventricular Rate:  64 PR Interval:  272 QRS Duration: 94 QT Interval:  436 QTC Calculation: 449 R Axis:   -4 Text Interpretation:  Sinus rhythm with 1st degree A-V block Non-specific  intra-ventricular conduction block No significant change since last  tracing Confirmed by Ashok Cordia  MD, Lennette Bihari ([id number]) on 09/08/2014 8:44:17 PM      MDM   Iv ns. Labs.   Reviewed nursing notes and prior charts for additional history.   bp currently improved from prior but still hypertensive.  Pt has not yet had several of his normal evening meds, including lisinopril, and carvedilol - spouse has them in pill case for evening dose, will give them.  Given upper arm pain/discomfort earlier, will get delta troponin to make sure repeat trop not increasing/high.  If pt remains symptom free, and delta trop not elevated, feel pt stable for d/c w close follow up with his doctors at [place].   Pt currently symptom free, bp improved.  Pt states he was advised to increase coumadin dose at [facility] earlier today after labs checked there.   Discussed labs incl Cr sl elev at 1.42 - pt and spouse indicate have been told renal fxn mildly elevated previously/recently at [facility] (last labs here from approx 2 yrs ago).  Recheck no pain or discomfort. No sob. bp 140/68.   Signed out to Dr Roxanne Mins to check delta trop and dispo appropriately.      Mirna Mires, MD 09/08/14 2329

## 2014-09-09 DIAGNOSIS — I10 Essential (primary) hypertension: Secondary | ICD-10-CM | POA: Diagnosis present

## 2014-09-09 DIAGNOSIS — I249 Acute ischemic heart disease, unspecified: Secondary | ICD-10-CM | POA: Insufficient documentation

## 2014-09-09 DIAGNOSIS — I160 Hypertensive urgency: Secondary | ICD-10-CM | POA: Diagnosis present

## 2014-09-09 DIAGNOSIS — I25810 Atherosclerosis of coronary artery bypass graft(s) without angina pectoris: Secondary | ICD-10-CM

## 2014-09-09 DIAGNOSIS — I359 Nonrheumatic aortic valve disorder, unspecified: Secondary | ICD-10-CM

## 2014-09-09 DIAGNOSIS — I2510 Atherosclerotic heart disease of native coronary artery without angina pectoris: Secondary | ICD-10-CM

## 2014-09-09 LAB — HEPARIN LEVEL (UNFRACTIONATED)
Heparin Unfractionated: <0.1 IU/mL — ABNORMAL LOW (ref 0.30–0.70)
Heparin Unfractionated: 0.16 IU/mL — ABNORMAL LOW (ref 0.30–0.70)

## 2014-09-09 LAB — TROPONIN I
TROPONIN I: 0.15 ng/mL — AB (ref <0.031)
Troponin I: 0.12 ng/mL — ABNORMAL HIGH (ref <0.031)
Troponin I: 0.15 ng/mL — ABNORMAL HIGH (ref <0.031)
Troponin I: 0.16 ng/mL — ABNORMAL HIGH (ref <0.031)

## 2014-09-09 LAB — PROTIME-INR
INR: 1.82 — ABNORMAL HIGH (ref 0.00–1.49)
PROTHROMBIN TIME: 21.2 s — AB (ref 11.6–15.2)

## 2014-09-09 LAB — GLUCOSE, CAPILLARY
Glucose-Capillary: 136 mg/dL — ABNORMAL HIGH (ref 70–99)
Glucose-Capillary: 138 mg/dL — ABNORMAL HIGH (ref 70–99)
Glucose-Capillary: 186 mg/dL — ABNORMAL HIGH (ref 70–99)

## 2014-09-09 MED ORDER — INSULIN ASPART 100 UNIT/ML ~~LOC~~ SOLN
16.0000 [IU] | Freq: Two times a day (BID) | SUBCUTANEOUS | Status: DC
  Administered 2014-09-09 – 2014-09-14 (×9): 16 [IU] via SUBCUTANEOUS

## 2014-09-09 MED ORDER — HYDRALAZINE HCL 50 MG PO TABS
75.0000 mg | ORAL_TABLET | Freq: Three times a day (TID) | ORAL | Status: DC
  Administered 2014-09-09 – 2014-09-15 (×18): 75 mg via ORAL
  Filled 2014-09-09 (×22): qty 1

## 2014-09-09 MED ORDER — ROSUVASTATIN CALCIUM 20 MG PO TABS
20.0000 mg | ORAL_TABLET | Freq: Every day | ORAL | Status: DC
  Administered 2014-09-09 – 2014-09-15 (×7): 20 mg via ORAL
  Filled 2014-09-09 (×7): qty 1

## 2014-09-09 MED ORDER — LISINOPRIL 40 MG PO TABS
40.0000 mg | ORAL_TABLET | Freq: Every day | ORAL | Status: DC
  Administered 2014-09-09 – 2014-09-15 (×7): 40 mg via ORAL
  Filled 2014-09-09 (×7): qty 1

## 2014-09-09 MED ORDER — INSULIN GLARGINE 100 UNIT/ML ~~LOC~~ SOLN
55.0000 [IU] | Freq: Every day | SUBCUTANEOUS | Status: DC
  Administered 2014-09-09: 55 [IU] via SUBCUTANEOUS
  Filled 2014-09-09 (×2): qty 0.55

## 2014-09-09 MED ORDER — NITROGLYCERIN IN D5W 200-5 MCG/ML-% IV SOLN: 10.0000 ug/min | INTRAVENOUS | Status: DC

## 2014-09-09 MED ORDER — ASPIRIN EC 81 MG PO TBEC
81.0000 mg | DELAYED_RELEASE_TABLET | Freq: Every day | ORAL | Status: DC
  Administered 2014-09-10: 81 mg via ORAL
  Filled 2014-09-09: qty 1

## 2014-09-09 MED ORDER — WARFARIN SODIUM 5 MG PO TABS
5.0000 mg | ORAL_TABLET | Freq: Every day | ORAL | Status: DC
Start: 2014-09-09 — End: 2014-09-09

## 2014-09-09 MED ORDER — MAGNESIUM OXIDE 400 (241.3 MG) MG PO TABS
400.0000 mg | ORAL_TABLET | Freq: Two times a day (BID) | ORAL | Status: DC
  Administered 2014-09-09 – 2014-09-15 (×13): 400 mg via ORAL
  Filled 2014-09-09 (×14): qty 1

## 2014-09-09 MED ORDER — NITROGLYCERIN 0.4 MG SL SUBL
0.4000 mg | SUBLINGUAL_TABLET | SUBLINGUAL | Status: DC | PRN
  Administered 2014-09-09: 0.4 mg via SUBLINGUAL
  Filled 2014-09-09: qty 1

## 2014-09-09 MED ORDER — HYDRALAZINE HCL 25 MG PO TABS
25.0000 mg | ORAL_TABLET | Freq: Three times a day (TID) | ORAL | Status: DC
  Filled 2014-09-09 (×3): qty 1

## 2014-09-09 MED ORDER — FLUOXETINE HCL 20 MG PO CAPS
20.0000 mg | ORAL_CAPSULE | Freq: Every day | ORAL | Status: DC
  Administered 2014-09-09 – 2014-09-14 (×6): 20 mg via ORAL
  Filled 2014-09-09 (×7): qty 1

## 2014-09-09 MED ORDER — OMEGA-3-ACID ETHYL ESTERS 1 G PO CAPS
3.0000 g | ORAL_CAPSULE | Freq: Two times a day (BID) | ORAL | Status: DC
  Administered 2014-09-09 – 2014-09-15 (×13): 3 g via ORAL
  Filled 2014-09-09 (×19): qty 3

## 2014-09-09 MED ORDER — CYANOCOBALAMIN 1000 MCG/ML IJ SOLN: 1000.0000 ug | INTRAMUSCULAR | Status: DC

## 2014-09-09 MED ORDER — HEPARIN BOLUS VIA INFUSION
2000.0000 [IU] | Freq: Once | INTRAVENOUS | Status: AC
  Administered 2014-09-09: 2000 [IU] via INTRAVENOUS
  Filled 2014-09-09: qty 2000

## 2014-09-09 MED ORDER — SODIUM CHLORIDE 0.9 % IV SOLN: 1000.0000 mL | Freq: Once | INTRAVENOUS | Status: DC

## 2014-09-09 MED ORDER — HYDRALAZINE HCL 50 MG PO TABS
50.0000 mg | ORAL_TABLET | Freq: Three times a day (TID) | ORAL | Status: DC
  Administered 2014-09-09: 50 mg via ORAL
  Filled 2014-09-09 (×3): qty 1

## 2014-09-09 MED ORDER — HEPARIN (PORCINE) IN NACL 100-0.45 UNIT/ML-% IJ SOLN
1650.0000 [IU]/h | INTRAMUSCULAR | Status: DC
  Administered 2014-09-09: 1200 [IU]/h via INTRAVENOUS
  Administered 2014-09-09: 1400 [IU]/h via INTRAVENOUS
  Administered 2014-09-10: 1600 [IU]/h via INTRAVENOUS
  Administered 2014-09-11: 1650 [IU]/h via INTRAVENOUS
  Filled 2014-09-09 (×5): qty 250

## 2014-09-09 MED ORDER — WARFARIN SODIUM 7.5 MG PO TABS
7.5000 mg | ORAL_TABLET | Freq: Once | ORAL | Status: AC
  Administered 2014-09-09: 7.5 mg via ORAL
  Filled 2014-09-09: qty 1

## 2014-09-09 MED ORDER — AMLODIPINE BESYLATE 5 MG PO TABS
5.0000 mg | ORAL_TABLET | Freq: Two times a day (BID) | ORAL | Status: DC
  Administered 2014-09-09 – 2014-09-14 (×11): 5 mg via ORAL
  Filled 2014-09-09 (×13): qty 1

## 2014-09-09 MED ORDER — GLIPIZIDE 10 MG PO TABS
10.0000 mg | ORAL_TABLET | Freq: Two times a day (BID) | ORAL | Status: DC
  Administered 2014-09-09 – 2014-09-15 (×10): 10 mg via ORAL
  Filled 2014-09-09 (×16): qty 1

## 2014-09-09 MED ORDER — ISOSORBIDE DINITRATE 10 MG PO TABS
10.0000 mg | ORAL_TABLET | Freq: Two times a day (BID) | ORAL | Status: DC
  Administered 2014-09-09 – 2014-09-15 (×13): 10 mg via ORAL
  Filled 2014-09-09 (×14): qty 1

## 2014-09-09 MED ORDER — HYDRALAZINE HCL 50 MG PO TABS
50.0000 mg | ORAL_TABLET | Freq: Once | ORAL | Status: AC
  Administered 2014-09-09: 50 mg via ORAL
  Filled 2014-09-09: qty 1

## 2014-09-09 MED ORDER — ASPIRIN 81 MG PO CHEW
324.0000 mg | CHEWABLE_TABLET | Freq: Once | ORAL | Status: AC
  Administered 2014-09-09: 324 mg via ORAL
  Filled 2014-09-09: qty 4

## 2014-09-09 MED ORDER — ATORVASTATIN CALCIUM 40 MG PO TABS: 40.0000 mg | ORAL_TABLET | Freq: Every day | ORAL | Status: DC

## 2014-09-09 MED ORDER — FUROSEMIDE 20 MG PO TABS
20.0000 mg | ORAL_TABLET | Freq: Every day | ORAL | Status: DC
  Administered 2014-09-09 – 2014-09-15 (×7): 20 mg via ORAL
  Filled 2014-09-09 (×8): qty 1

## 2014-09-09 MED ORDER — CLOPIDOGREL BISULFATE 75 MG PO TABS
75.0000 mg | ORAL_TABLET | Freq: Every day | ORAL | Status: DC
  Administered 2014-09-09 – 2014-09-15 (×7): 75 mg via ORAL
  Filled 2014-09-09 (×8): qty 1

## 2014-09-09 MED ORDER — ONDANSETRON HCL 4 MG/2ML IJ SOLN: 4.0000 mg | Freq: Four times a day (QID) | INTRAMUSCULAR | Status: DC | PRN

## 2014-09-09 MED ORDER — SODIUM CHLORIDE 0.9 % IV SOLN: 1000.0000 mL | INTRAVENOUS | Status: DC

## 2014-09-09 MED ORDER — CARVEDILOL 25 MG PO TABS: 25.0000 mg | ORAL_TABLET | Freq: Two times a day (BID) | ORAL | Status: DC

## 2014-09-09 MED ORDER — ACETAMINOPHEN 325 MG PO TABS
650.0000 mg | ORAL_TABLET | ORAL | Status: DC | PRN
  Administered 2014-09-13 – 2014-09-14 (×2): 650 mg via ORAL
  Filled 2014-09-09 (×2): qty 2

## 2014-09-09 MED ORDER — CARVEDILOL 12.5 MG PO TABS
12.5000 mg | ORAL_TABLET | Freq: Two times a day (BID) | ORAL | Status: DC
  Administered 2014-09-09 – 2014-09-15 (×13): 12.5 mg via ORAL
  Filled 2014-09-09 (×17): qty 1

## 2014-09-09 MED ORDER — GLYBURIDE 5 MG PO TABS: 10.0000 mg | ORAL_TABLET | Freq: Two times a day (BID) | ORAL | Status: DC

## 2014-09-09 MED ORDER — AMLODIPINE BESYLATE 5 MG PO TABS
5.0000 mg | ORAL_TABLET | Freq: Every day | ORAL | Status: DC
  Administered 2014-09-09: 5 mg via ORAL
  Filled 2014-09-09: qty 1

## 2014-09-09 MED ORDER — ASPIRIN EC 81 MG PO TBEC: 81.0000 mg | DELAYED_RELEASE_TABLET | Freq: Every day | ORAL | Status: DC

## 2014-09-09 MED ORDER — SPIRONOLACTONE 25 MG PO TABS
25.0000 mg | ORAL_TABLET | Freq: Every day | ORAL | Status: DC
  Administered 2014-09-09 – 2014-09-15 (×7): 25 mg via ORAL
  Filled 2014-09-09 (×7): qty 1

## 2014-09-09 MED ORDER — WARFARIN - PHARMACIST DOSING INPATIENT
Freq: Every day | Status: DC
  Administered 2014-09-09: 17:00:00

## 2014-09-09 NOTE — Progress Notes (Addendum)
ANTICOAGULATION CONSULT NOTE - Initial Consult  Pharmacy Consult for Heparin/Warfarin  Indication: chest pain/ACS, afib  No Known Allergies  Patient Measurements: Height: 6' (182.9 cm) Weight: 260 lb (117.935 kg) IBW/kg (Calculated) : 77.6  Vital Signs: Temp: 98.1 F (36.7 C) (01/05 2035) Temp Source: Oral (01/05 2035) BP: 136/63 mmHg (01/05 2330) Pulse Rate: 56 (01/05 2330)  Labs:  Recent Labs  09/08/14 2134 09/08/14 2347  HGB 13.8  --   HCT 39.2  --   PLT 99*  --   LABPROT 20.0*  --   INR 1.68*  --   CREATININE 1.42*  --   TROPONINI 0.05* 0.12*    Estimated Creatinine Clearance: 64.2 mL/min (by C-G formula based on Cr of 1.42).   Medical History: Past Medical History  Diagnosis Date  . Hypertension   . Diabetes mellitus without complication   . Stroke   . CHF (congestive heart failure)   . Coronary artery disease   . Myocardial infarction     Assessment: Heparin per pharmacy for mildly elevated troponin. Pt takes warfarin PTA (INR is 1.68). CBC good, mild renal dysfunction, other labs as above.  Goal of Therapy:  Heparin level 0.3-0.7 units/ml Monitor platelets by anticoagulation protocol: Yes   Plan:  -Heparin 2000 units BOLUS -Start heparin drip at 1200 units/hr -1000 HL -Daily CBC/HL -Monitor for bleeding  Narda Bonds 09/09/2014,12:53 AM   ============================== Addendum 3:46 AM  Warfarin re-ordered. Cardiology has no plans for any intervention that would require warfarin to be held.  -Re-assess AM INR for dosing needs Sharlynn Oliphant, PharmD

## 2014-09-09 NOTE — ED Provider Notes (Signed)
Patient initially seen and evaluated by Dr. Ashok Cordia with hypertension and right arm discomfort. Initial troponin was slightly elevated at 0.05 and repeat troponin has come back higher at 0.12. ECG showed no acute changes and repeat ECG showed no change. He is on warfarin but has a subtherapeutic INR. He is given aspirin and started on heparin and nitroglycerin drips. Case is discussed with Dr. Tommi Rumps of cardiology service who agrees to admit the patient.   EKG Interpretation   Date/Time:  Wednesday September 09 2014 01:02:22 EST Ventricular Rate:  57 PR Interval:  272 QRS Duration: 108 QT Interval:  450 QTC Calculation: 438 R Axis:   14 Text Interpretation:  Junctional rhythm Inferior infarct, old Lateral  leads are also involved Nonspecific T wave abnormality No significant  change since last tracing Confirmed by [facility]  MD, Shawne Eskelson ([id number]) on  09/09/2014 1:25:54 AM        Delora Fuel, MD 45/62/56 [id number]

## 2014-09-09 NOTE — Progress Notes (Signed)
Pt admitted to 2W06, A+Ox4, no complaints of chest pain at this time. Nitro not running from ED d/t pt not having any pain. Heparin running at 12 ml/hr. Pt has scabs on right inner thigh from previous shingles he stated "about a week and a half ago".

## 2014-09-09 NOTE — Progress Notes (Signed)
ANTICOAGULATION CONSULT NOTE - Initial Consult  Pharmacy Consult for Heparin/Warfarin  Indication: chest pain/ACS, afib  No Known Allergies  Patient Measurements: Height: 6' (182.9 cm) Weight = 118 kg IBW/kg (Calculated) : 77.6 Heparin dosing weight  103.5 kg  Vital Signs: Temp: 98 F (36.7 C) (01/06 1441) Temp Source: Oral (01/06 1441) BP: 174/71 mmHg (01/06 1441) Pulse Rate: 58 (01/06 1441)  Labs:  Recent Labs  09/08/14 2134  09/09/14 0445 09/09/14 0933 09/09/14 0935 09/09/14 1601 09/09/14 1915  HGB 13.8  --   --   --   --   --   --   HCT 39.2  --   --   --   --   --   --   PLT 99*  --   --   --   --   --   --   LABPROT 20.0*  --  21.2*  --   --   --   --   INR 1.68*  --  1.82*  --   --   --   --   HEPARINUNFRC  --   --   --   --  <0.10*  --  0.16*  CREATININE 1.42*  --   --   --   --   --   --   TROPONINI 0.05*  < > 0.15* 0.16*  --  0.15*  --   < > = values in this interval not displayed.  Estimated Creatinine Clearance: 64.2 mL/min (by C-G formula based on Cr of 1.42).   Medical History: Past Medical History  Diagnosis Date  . Hypertension   . Diabetes mellitus without complication   . Stroke   . CHF (congestive heart failure)   . Coronary artery disease   . Myocardial infarction     Assessment: 63 YOM on coumadin PTA for Afib, now on IV heparin for chest pain, with mildly elevated troponin. INR 1.82 this morning. Heparin level undetectable on 1200 units/hr. Pending Echo to decide if stress test or cath is needed.  Coumadin PTA dose: 7.5 mg on Tuesday, Wednesday, Thursday, Saturday and Sunday, 5mg  on Monday and Friday  Repeat heparin level is subtherapeutic at 0.16 on heparin 1400 units/hr. No bleeding noted.  Goal of Therapy:  Heparin level 0.3-0.7 units/ml Monitor platelets by anticoagulation protocol: Yes   Plan:  - Increase heparin rate to 1600 units/hr - Recheck 8h HL - Coumadin 7.5 mg po x 1 - Daily CBC/HL/INR - Monitor for  bleeding  Andrey Cota. Diona Foley, PharmD Clinical Pharmacist Pager [phone]  09/09/2014,8:10 PM

## 2014-09-09 NOTE — Progress Notes (Signed)
Dr Tommi Rumps paged in reguards to pts BP on admission 170/74. Orders placed. MD stated that he does not want BP to drop very low due to a high home BP 347-425 systolic is his goal.  [place] Blas. Dalbert Batman, RN, BSN 09/09/2014 4:04 AM

## 2014-09-09 NOTE — H&P (Signed)
Patient ID: Vernon Briggs MRN: [id number], DOB/AGE: Dec 17, 1945   Admit date: 09/08/2014   Primary Physician: Default, Provider, MD Primary Cardiologist: [facility]  Pt. Profile:   72M with hypertension, hyperlipidemia, diabetes mellitus, coronary artery disease status post CABG (4v) and stent deployment x 6, CHF, AF on warfarin, status post ICD placement, and left frontal stroke without current residual deficits who presents with hypertensive emergency and mildly elevated troponins.   Problem List  Past Medical History  Diagnosis Date  . Hypertension   . Diabetes mellitus without complication   . Stroke   . CHF (congestive heart failure)   . Coronary artery disease   . Myocardial infarction     Past Surgical History  Procedure Laterality Date  . Coronary artery bypass graft    . Heart stent      Dr. Ronnald Ramp in [place], [place]  . Cardiac defibrillator placement      [place]     Allergies  No Known Allergies  HPI  72M with hypertension, hyperlipidemia, diabetes mellitus, coronary artery disease status post CABG (4v) and stent deployment x 6, CHF, AF on warfarin, status post ICD placement, and left frontal stroke without current residual deficits who presents with hypertensive emergency and mildly elevated troponins.   Vernon Briggs was in his USOH today when he developed right upper arm pain which prompted his wife to check his BP. BPs were noted to be labile, gradually climbing. Although patient cannot remember the highest value, the initial ED evaluation reports a peak of 240/120. No associated headache, chest pain, or worsened SOB (he has some exertional SOB at baseline). Because of this, he reported to the ED. Of note, he reports 100% compliance with BP meds; lives with wife to sees to it. No alcohol or illicits. Did report he recently "flunked" overnight polysomnography and is to get a CPAP fitting over the next week or two.   On arrival to the ED, BP was  203/92, HR 69. BP improved without administration of medications in the ED. Labs notable for K 4.1, Cr 1.42, TnI 0.05-->0.12, INR 1.68. ECG without acute evidence of ischemia. In the setting of a positive troponin, he was given aspirin, started on heparin, and a nitro drip.   The patient reports that prior to previous stents, he felt a chest tightness and states that it is very different from what he felt today. He states BP is usually poorly controled, around 170s/80s.     Home Medications  Prior to Admission medications   Medication Sig Start Date End Date Taking? Authorizing Provider  aspirin EC 81 MG tablet Take 81 mg by mouth daily.   Yes Historical Provider, MD  carvedilol (COREG) 12.5 MG tablet Take 12.5 mg by mouth 2 (two) times daily with a meal.   Yes Historical Provider, MD  clopidogrel (PLAVIX) 75 MG tablet Take 75 mg by mouth daily.   Yes Historical Provider, MD  cyanocobalamin (,VITAMIN B-12,) 1000 MCG/ML injection Inject 1,000 mcg into the muscle every 30 (thirty) days.   Yes Historical Provider, MD  FLUoxetine (PROZAC) 20 MG capsule Take 20 mg by mouth at bedtime.   Yes Historical Provider, MD  furosemide (LASIX) 20 MG tablet Take 20 mg by mouth daily.   Yes Historical Provider, MD  glipiZIDE (GLUCOTROL) 10 MG tablet Take 10 mg by mouth 2 (two) times daily before a meal.   Yes Historical Provider, MD  insulin glargine (LANTUS) 100 UNIT/ML injection Inject 55 Units into the  skin at bedtime.    Yes Historical Provider, MD  insulin regular (NOVOLIN R,HUMULIN R) 100 units/mL injection Inject 16 Units into the skin 2 (two) times daily before a meal.   Yes Historical Provider, MD  isosorbide dinitrate (ISORDIL) 10 MG tablet Take 10 mg by mouth 2 (two) times daily.   Yes Historical Provider, MD  lisinopril (PRINIVIL,ZESTRIL) 40 MG tablet Take 40 mg by mouth daily.   Yes Historical Provider, MD  magnesium oxide (MAG-OX) 400 MG tablet Take 400 mg by mouth 2 (two) times daily.   Yes  Historical Provider, MD  metFORMIN (GLUCOPHAGE) 500 MG tablet Take 500 mg by mouth 2 (two) times daily with a meal.   Yes Historical Provider, MD  omega-3 acid ethyl esters (LOVAZA) 1 G capsule Take 3 g by mouth 2 (two) times daily.   Yes Historical Provider, MD  rosuvastatin (CRESTOR) 20 MG tablet Take 20 mg by mouth daily.   Yes Historical Provider, MD  spironolactone (ALDACTONE) 25 MG tablet Take 25 mg by mouth daily.   Yes Historical Provider, MD  warfarin (COUMADIN) 5 MG tablet Take 5-7.5 mg by mouth daily. Take 1 and 1/2 tablets on Tuesday, Wednesday, Thursday, Saturday and Sunday then take 1 tablet on Monday and Friday   Yes Historical Provider, MD  atorvastatin (LIPITOR) 80 MG tablet Take 40 mg by mouth daily.    Historical Provider, MD  carvedilol (COREG) 25 MG tablet Take 25 mg by mouth 2 (two) times daily with a meal.    Historical Provider, MD  glyBURIDE (DIABETA) 5 MG tablet Take 10 mg by mouth 2 (two) times daily.    Historical Provider, MD  hydrALAZINE (APRESOLINE) 10 MG tablet Take 20 mg by mouth 3 (three) times daily.    Historical Provider, MD    Family History  No family history on file.  Social History  History   Social History  . Marital Status: Married    Spouse Name: N/A    Number of Children: N/A  . Years of Education: N/A   Occupational History  . Not on file.   Social History Main Topics  . Smoking status: Never Smoker   . Smokeless tobacco: Not on file  . Alcohol Use: Yes     Comment: seldom  . Drug Use: No  . Sexual Activity: Not on file   Other Topics Concern  . Not on file   Social History Narrative     Review of Systems General:  No chills, fever, night sweats or weight changes.  Cardiovascular:  See HPO Dermatological: Recent RLE zoster Respiratory: No cough. Baseline DOE Urologic: No hematuria, dysuria Abdominal:   No nausea, vomiting, diarrhea, bright red blood per rectum, melena, or hematemesis Neurologic:  No visual changes, wkns,  changes in mental status. All other systems reviewed and are otherwise negative except as noted above.  Physical Exam  Blood pressure 152/71, pulse 60, temperature 98.1 F (36.7 C), temperature source Oral, resp. rate 30, height 6' (1.829 m), weight 117.935 kg (260 lb), SpO2 97 %.  General: Pleasant, NAD Psych: Normal affect. Neuro: Alert and oriented X 3. Moves all extremities spontaneously. HEENT: Normal  Neck: Supple without bruits or JVD. Lungs:  Resp regular and unlabored, CTA. Heart: RRR no s3, s4, or murmurs. Abdomen: Soft, non-tender, non-distended, BS + x 4.  Extremities: No clubbing, cyanosis or edema. DP/PT/Radials 2+ and equal bilaterally. 1+ b/l LE edema.  Labs  Troponin (Point of Care Test) No results for input(s): TROPIPOC  in the last 72 hours.  Recent Labs  09/08/14 2134 09/08/14 2347  TROPONINI 0.05* 0.12*   Lab Results  Component Value Date   WBC 7.7 09/08/2014   HGB 13.8 09/08/2014   HCT 39.2 09/08/2014   MCV 94.9 09/08/2014   PLT 99* 09/08/2014    Recent Labs Lab 09/08/14 2134  NA 138  K 4.1  CL 104  CO2 25  BUN 29*  CREATININE 1.42*  CALCIUM 9.0  PROT 5.9*  BILITOT 0.8  ALKPHOS 65  ALT 34  AST 36  GLUCOSE 169*   Lab Results  Component Value Date   CHOL 75 November 06, 2012   HDL 27* 11/06/2012   LDLCALC 31 11/06/2012   TRIG 85 Nov 06, 2012   No results found for: DDIMER   Radiology/Studies  No results found.   TTE 06-Nov-2012 Study Conclusions  - Left ventricle: The cavity size was normal. There was moderate concentric hypertrophy. Systolic function was mildly to moderately reduced. The estimated ejection fraction was in the range of 40% to 45%. There is akinesis of the mid-distalanteroseptal myocardium. - Mitral valve: Mild regurgitation. - Left atrium: The atrium was mildly dilated. - Tricuspid valve: Moderate regurgitation. - Pulmonary arteries: PA peak pressure: 66mm Hg (S).  ECG  09/08/14: NSR, 1st degree AV block,  LVH, old IMI. Compared to 10/20/12, ECG is essentially unchanged.   ASSESSMENT AND PLAN  24M with hypertension, hyperlipidemia, diabetes mellitus, coronary artery disease status post CABG (4v) and PCI x 6, CHF, AF on warfarin, status post ICD placement, and left frontal stroke without current residual deficits who presents with hypertensive emergency and mildly elevated troponins.   I suspect that the elevated troponin is related to a very elevated BP in the setting of LVH, CAD, and systolic dysfunction. The CP symptoms are atypical and not consistent with prior anginal episodes. In the setting of the troponin elevation, he should be admitted and monitored closely.  1. Continue IV UFH for now; will likely stop based on 3rd troponin 2. Stop IV NTG; continue BB, ASA, statin, clopidogrel 3. Restart home BP regimen: coreg 25mg  BID, imdur 10mg  BID, lisinopril 40mg , spiro 25mg . Will increase hydralazine to 50mg  TID and start norvasc 5mg .  4. Given the severity of the hypertension, we should make sure that a secondary HTN work-up has occurred by his primary cardiologist at the [facility]. 5. We discussed that OSA treatment with CPAP may actually be very helpful in this situation.  6. Continue warfarin for AF; patient reports [facility] just increased outpatient dose today  7. TTE; if echo is stable and 3rd troponin is not impressive, he may not require cath or stress given clinical scenario  Signed, Lamar Sprinkles, MD 09/09/2014, 2:29 AM

## 2014-09-09 NOTE — Progress Notes (Signed)
ANTICOAGULATION CONSULT NOTE - Initial Consult  Pharmacy Consult for Heparin/Warfarin  Indication: chest pain/ACS, afib  No Known Allergies  Patient Measurements: Height: 6' (182.9 cm) Weight = 118 kg IBW/kg (Calculated) : 77.6 Heparin dosing weight  103.5 kg  Vital Signs: Temp: 97.5 F (36.4 C) (01/06 0338) Temp Source: Oral (01/06 0338) BP: 190/90 mmHg (01/06 0949) Pulse Rate: 57 (01/06 0338)  Labs:  Recent Labs  09/08/14 2134 09/08/14 2347 09/09/14 0445 09/09/14 0935  HGB 13.8  --   --   --   HCT 39.2  --   --   --   PLT 99*  --   --   --   LABPROT 20.0*  --  21.2*  --   INR 1.68*  --  1.82*  --   HEPARINUNFRC  --   --   --  <0.10*  CREATININE 1.42*  --   --   --   TROPONINI 0.05* 0.12* 0.15*  --     Estimated Creatinine Clearance: 36.3 mL/min (by C-G formula based on Cr of 1.42).   Medical History: Past Medical History  Diagnosis Date  . Hypertension   . Diabetes mellitus without complication   . Stroke   . CHF (congestive heart failure)   . Coronary artery disease   . Myocardial infarction     Assessment: 73 YOM on coumadin PTA for Afib, now on IV heparin for chest pain, with mildly elevated troponin. INR 1.82 this morning. Heparin level undetectable on 1200 units/hr. Pending Echo to decide if stress test or cath is needed.  Coumadin PTA dose: 7.5 mg on Tuesday, Wednesday, Thursday, Saturday and Sunday, 5mg  on Monday and Friday  Goal of Therapy:  Heparin level 0.3-0.7 units/ml Monitor platelets by anticoagulation protocol: Yes   Plan:  - Increase heparin rate to 1400 units/hr - Recheck heparin level at 1700 - Coumadin 7.5 mg po x 1 - Daily CBC/HL/INR - Monitor for bleeding  Maryanna Shape, PharmD, BCPS  Clinical Pharmacist  Pager: [phone]   09/09/2014,10:31 AM

## 2014-09-09 NOTE — Progress Notes (Signed)
  Echocardiogram 2D Echocardiogram has been performed.  Vernon Briggs 09/09/2014, 11:38 AM

## 2014-09-09 NOTE — Progress Notes (Addendum)
Patient Name: Vernon Briggs Date of Encounter: 09/09/2014  Active Problems:   Acute coronary syndrome   Hypertensive emergency   Primary Cardiologist: [facility]   Patient Profile: 72M with HTN, HLD, DM, CABG x 4, stents x 6, CHF, AF on warfarin, ICD placement, L frontal CVA, no residual deficits, was admitted 01/05 with hypertensive emergency and mildly elevated troponin.  SUBJECTIVE: Had chest tightness this am, approx 4/10, relieved by SL NTG x 1. Never taken NTG before. LE edema x 6 months  OBJECTIVE Filed Vitals:   09/09/14 0308 09/09/14 0338 09/09/14 0700 09/09/14 0949  BP:  170/74 171/70 190/90  Pulse:  57    Temp: 98.5 F (36.9 C) 97.5 F (36.4 C)    TempSrc: Oral Oral    Resp:  14    Height:  6' (1.829 m)    Weight:  116 lb 12.8 oz (52.98 kg)    SpO2:  99%      Intake/Output Summary (Last 24 hours) at 09/09/14 3557 Last data filed at 09/09/14 0900  Gross per 24 hour  Intake      0 ml  Output      0 ml  Net      0 ml   Filed Weights   09/08/14 2035 09/09/14 0338  Weight: 260 lb (117.935 kg) 116 lb 12.8 oz (52.98 kg)    PHYSICAL EXAM General: Well developed, well nourished, male in no acute distress. Head: Normocephalic, atraumatic.  Neck: Supple without bruits, JVD not elevated. Lungs:  Resp regular and unlabored, CTA. Heart: RRR, S1, S2, no S3, S4, or murmur; no rub. Abdomen: Soft, non-tender, non-distended, BS + x 4.  Extremities: No clubbing, cyanosis, 1 + edema.  Neuro: Alert and oriented X 3. Moves all extremities spontaneously. Psych: Normal affect.  LABS: CBC:  Recent Labs  09/08/14 2134  WBC 7.7  HGB 13.8  HCT 39.2  MCV 94.9  PLT 99*   INR:  Recent Labs  09/09/14 0445  INR 3.22*   Basic Metabolic Panel:  Recent Labs  09/08/14 2134  NA 138  K 4.1  CL 104  CO2 25  GLUCOSE 169*  BUN 29*  CREATININE 1.42*  CALCIUM 9.0   Liver Function Tests:  Recent Labs  09/08/14 2134  AST 36  ALT 34  ALKPHOS 65  BILITOT  0.8  PROT 5.9*  ALBUMIN 3.5   Cardiac Enzymes:  Recent Labs  09/08/14 2134 09/08/14 2347 09/09/14 0445  TROPONINI 0.05* 0.12* 0.15*    TELE:   SR     Radiology/Studies: none  Current Medications:  . amLODipine  5 mg Oral Daily  . [START ON 09/10/2014] aspirin EC  81 mg Oral Daily  . carvedilol  12.5 mg Oral BID WC  . clopidogrel  75 mg Oral Daily  . FLUoxetine  20 mg Oral QHS  . furosemide  20 mg Oral Daily  . glipiZIDE  10 mg Oral BID AC  . hydrALAZINE  50 mg Oral TID  . insulin aspart  16 Units Subcutaneous BID AC  . insulin glargine  55 Units Subcutaneous QHS  . isosorbide dinitrate  10 mg Oral BID  . lisinopril  40 mg Oral Daily  . magnesium oxide  400 mg Oral BID  . omega-3 acid ethyl esters  3 g Oral BID  . rosuvastatin  20 mg Oral Daily  . spironolactone  25 mg Oral Daily   . heparin 1,200 Units/hr (09/09/14 0149)  . nitroGLYCERIN Stopped (  09/09/14 0155)    ASSESSMENT AND PLAN: Active Problems:   Acute coronary syndrome - mild elevation in troponins, without ischemic symptoms, in the setting of HTN emergency. Echo ordered, EF 40-45% in 2014. Recheck pending. I    Hypertensive emergency - per pt, BP running high at home, higher on admit at 240/120. On home rx, with hydralazine increased and Norvasc added (1st dose today). BP before am meds today 190/90.  Signed, Rosaria Ferries , PA-C 9:52 AM 09/09/2014 Patient seen and examined  I have amended note above by R Barrett to  reflect my findings.  Patinet with known CAD  Followed at [facility] (Dr Koleen Nimrod is fellow now)  Last seen in November   Patinet has noted increased SOB No CP at home  Did have an episode of chest tightness this AM He has had mild bump in trop  It still is going up  Will follow Echo is rel unchanged from 2014  LVEF is 35 to 40%  With regional wall motion abnormalities.  I would recomm 1.  Getting records from [facility] 2.  Sched for [employer] in AM  3.  Continue to cycle trop  If  triv bump may reflect strain in setting of HTN 4  Continue to titrate meds for BP    Medical records fax number [phone]    Last 4 of Social security number [phone]  DOB 07-20-2046   Need:  Cath report, cardiology clinic notes, stress test reports    [facility] [phone] [place]

## 2014-09-10 ENCOUNTER — Observation Stay (HOSPITAL_COMMUNITY): Payer: Non-veteran care

## 2014-09-10 DIAGNOSIS — R079 Chest pain, unspecified: Secondary | ICD-10-CM

## 2014-09-10 LAB — BASIC METABOLIC PANEL
Anion gap: 8 (ref 5–15)
BUN: 23 mg/dL (ref 6–23)
CO2: 30 mmol/L (ref 19–32)
Calcium: 9 mg/dL (ref 8.4–10.5)
Chloride: 104 mEq/L (ref 96–112)
Creatinine, Ser: 1.52 mg/dL — ABNORMAL HIGH (ref 0.50–1.35)
GFR calc Af Amer: 52 mL/min — ABNORMAL LOW (ref >90)
GFR calc non Af Amer: 45 mL/min — ABNORMAL LOW (ref >90)
Glucose, Bld: 147 mg/dL — ABNORMAL HIGH (ref 70–99)
POTASSIUM: 4.1 mmol/L (ref 3.5–5.1)
SODIUM: 142 mmol/L (ref 135–145)

## 2014-09-10 LAB — PROTIME-INR
INR: 1.97 — AB (ref 0.00–1.49)
PROTHROMBIN TIME: 22.6 s — AB (ref 11.6–15.2)

## 2014-09-10 LAB — HEPARIN LEVEL (UNFRACTIONATED)
Heparin Unfractionated: 0.38 IU/mL (ref 0.30–0.70)
Heparin Unfractionated: 0.27 IU/mL — ABNORMAL LOW (ref 0.30–0.70)

## 2014-09-10 LAB — CBC
HEMATOCRIT: 38.1 % — AB (ref 39.0–52.0)
Hemoglobin: 13.5 g/dL (ref 13.0–17.0)
MCH: 33.4 pg (ref 26.0–34.0)
MCHC: 35.4 g/dL (ref 30.0–36.0)
MCV: 94.3 fL (ref 78.0–100.0)
Platelets: 84 10*3/uL — ABNORMAL LOW (ref 150–400)
RBC: 4.04 MIL/uL — ABNORMAL LOW (ref 4.22–5.81)
RDW: 14.2 % (ref 11.5–15.5)
WBC: 6.5 10*3/uL (ref 4.0–10.5)

## 2014-09-10 LAB — GLUCOSE, CAPILLARY
GLUCOSE-CAPILLARY: 159 mg/dL — AB (ref 70–99)
Glucose-Capillary: 100 mg/dL — ABNORMAL HIGH (ref 70–99)
Glucose-Capillary: 159 mg/dL — ABNORMAL HIGH (ref 70–99)

## 2014-09-10 MED ORDER — ASPIRIN EC 81 MG PO TBEC
81.0000 mg | DELAYED_RELEASE_TABLET | Freq: Every day | ORAL | Status: DC
  Administered 2014-09-12 – 2014-09-15 (×3): 81 mg via ORAL
  Filled 2014-09-10 (×3): qty 1

## 2014-09-10 MED ORDER — SODIUM CHLORIDE 0.9 % IJ SOLN
3.0000 mL | Freq: Two times a day (BID) | INTRAMUSCULAR | Status: DC
  Administered 2014-09-12 – 2014-09-13 (×2): 3 mL via INTRAVENOUS

## 2014-09-10 MED ORDER — REGADENOSON 0.4 MG/5ML IV SOLN
0.4000 mg | Freq: Once | INTRAVENOUS | Status: AC
  Administered 2014-09-10: 0.4 mg via INTRAVENOUS
  Filled 2014-09-10: qty 5

## 2014-09-10 MED ORDER — TECHNETIUM TC 99M SESTAMIBI GENERIC - CARDIOLITE
10.0000 | Freq: Once | INTRAVENOUS | Status: AC | PRN
  Administered 2014-09-10: 10 via INTRAVENOUS

## 2014-09-10 MED ORDER — SODIUM CHLORIDE 0.9 % IV SOLN
1.0000 mL/kg/h | INTRAVENOUS | Status: DC
  Administered 2014-09-11: 1 mL/kg/h via INTRAVENOUS

## 2014-09-10 MED ORDER — ASPIRIN 81 MG PO CHEW
324.0000 mg | CHEWABLE_TABLET | ORAL | Status: AC
  Administered 2014-09-11: 324 mg via ORAL
  Filled 2014-09-10: qty 4

## 2014-09-10 MED ORDER — SODIUM CHLORIDE 0.9 % IJ SOLN: 3.0000 mL | INTRAMUSCULAR | Status: DC | PRN

## 2014-09-10 MED ORDER — WARFARIN SODIUM 7.5 MG PO TABS
7.5000 mg | ORAL_TABLET | Freq: Once | ORAL | Status: DC
  Filled 2014-09-10: qty 1

## 2014-09-10 MED ORDER — REGADENOSON 0.4 MG/5ML IV SOLN
INTRAVENOUS | Status: AC
  Administered 2014-09-10: 0.4 mg via INTRAVENOUS
  Filled 2014-09-10: qty 5

## 2014-09-10 MED ORDER — SODIUM CHLORIDE 0.9 % IV SOLN: 250.0000 mL | INTRAVENOUS | Status: DC | PRN

## 2014-09-10 MED ORDER — TECHNETIUM TC 99M SESTAMIBI GENERIC - CARDIOLITE
30.0000 | Freq: Once | INTRAVENOUS | Status: AC | PRN
  Administered 2014-09-10: 30 via INTRAVENOUS

## 2014-09-10 NOTE — Progress Notes (Addendum)
ANTICOAGULATION CONSULT NOTE - Follow Up Consult  Pharmacy Consult for Heparin/Warfarin  Indication: chest pain/ACS, afib  No Known Allergies  Patient Measurements: Height: 6' (182.9 cm) Weight = 118 kg IBW/kg (Calculated) : 77.6 Heparin dosing weight  103.5 kg  Vital Signs: Temp: 98.3 F (36.8 C) (01/07 0440) Temp Source: Oral (01/07 0440) BP: 153/64 mmHg (01/07 0440) Pulse Rate: 60 (01/07 0440)  Labs:  Recent Labs  09/08/14 2134  09/09/14 0445 09/09/14 0933 09/09/14 0935 09/09/14 1601 09/09/14 1915 09/10/14 0359  HGB 13.8  --   --   --   --   --   --  13.5  HCT 39.2  --   --   --   --   --   --  38.1*  PLT 99*  --   --   --   --   --   --  84*  LABPROT 20.0*  --  21.2*  --   --   --   --   --   INR 1.68*  --  1.82*  --   --   --   --   --   HEPARINUNFRC  --   --   --   --  <0.10*  --  0.16* 0.38  CREATININE 1.42*  --   --   --   --   --   --   --   TROPONINI 0.05*  < > 0.15* 0.16*  --  0.15*  --   --   < > = values in this interval not displayed.  Estimated Creatinine Clearance: 64.2 mL/min (by C-G formula based on Cr of 1.42).   Medical History: Past Medical History  Diagnosis Date  . Hypertension   . Diabetes mellitus without complication   . Stroke   . CHF (congestive heart failure)   . Coronary artery disease   . Myocardial infarction     Assessment: 68 YOM on coumadin PTA for Afib, currently on IV heparin at 1600 units/hr for chest pain. HL is therapeutic this AM. INR up to 1.97 today. RN reports no s/s of bleeding.    Coumadin PTA dose: 7.5 mg on Tuesday, Wednesday, Thursday, Saturday and Sunday, 5mg  on Monday and Friday   Goal of Therapy:  Heparin level 0.3-0.7 units/ml Monitor platelets by anticoagulation protocol: Yes   Plan:  - Continue heparin rate at 1600 units/hr - Recheck 8h HL - Repeat Coumadin 7.5 mg po x 1 - Daily CBC/HL/INR - Monitor for bleeding  Albertina Parr, PharmD., BCPS Clinical Pharmacist Pager  [phone]   =========================== Addendum: Heparin level 0.27, slightly subtherapeutic on 1600 units/hr.  No bleeding noted per chart. Plan for Myoview today. hgb 13.5 stable, plt 84k, noted low at admission 99k prior to heparin was started   Plan: - Increase heparin rate slightly to 1650 units/hr given low plt and near therapeutic PT/INR.  - f/u labs in AM  Maryanna Shape, PharmD, BCPS  Clinical Pharmacist  Pager: [phone]

## 2014-09-10 NOTE — Progress Notes (Signed)
The risks and benefits of a cardiac catheterization including, but not limited to, death, stroke, MI, kidney damage and bleeding were discussed with the patient who indicates understanding and agrees to proceed.  Rosaria Ferries, PA-C 09/10/2014 7:58 PM Beeper [phone]

## 2014-09-10 NOTE — Progress Notes (Signed)
UR completed 

## 2014-09-10 NOTE — Progress Notes (Signed)
Lexiscan MV performed 

## 2014-09-10 NOTE — Progress Notes (Signed)
Subjective: Denies CP  No SOB   Objective: Filed Vitals:   09/09/14 0949 09/09/14 1441 09/09/14 2022 09/10/14 0440  BP: 190/90 174/71 147/77 153/64  Pulse:  58 65 60  Temp:  98 F (36.7 C) 97.6 F (36.4 C) 98.3 F (36.8 C)  TempSrc:  Oral Oral Oral  Resp:  18 18 18   Height:      Weight:      SpO2:  99% 99% 99%   Weight change:   Intake/Output Summary (Last 24 hours) at 09/10/14 0751 Last data filed at 09/09/14 1800  Gross per 24 hour  Intake    480 ml  Output      0 ml  Net    480 ml    General: Alert, awake, oriented x3, in no acute distress Neck:  JVP is normal Heart: Regular rate and rhythm, without murmurs, rubs, gallops.  Lungs: Clear to auscultation.  No rales or wheezes. Exemities:  No edema.   Neuro: Grossly intact, nonfocal.  Tele:  SB 50s to 60    Lab Results: Results for orders placed or performed during the hospital encounter of 09/08/14 (from the past 24 hour(s))  Troponin I-(serum)     Status: Abnormal   Collection Time: 09/09/14  9:33 AM  Result Value Ref Range   Troponin I 0.16 (H) <0.031 ng/mL  Heparin level (unfractionated)     Status: Abnormal   Collection Time: 09/09/14  9:35 AM  Result Value Ref Range   Heparin Unfractionated <0.10 (L) 0.30 - 0.70 IU/mL  Troponin I-(serum)     Status: Abnormal   Collection Time: 09/09/14  4:01 PM  Result Value Ref Range   Troponin I 0.15 (H) <0.031 ng/mL  Glucose, capillary     Status: Abnormal   Collection Time: 09/09/14  4:12 PM  Result Value Ref Range   Glucose-Capillary 138 (H) 70 - 99 mg/dL  Heparin level (unfractionated)     Status: Abnormal   Collection Time: 09/09/14  7:15 PM  Result Value Ref Range   Heparin Unfractionated 0.16 (L) 0.30 - 0.70 IU/mL  Glucose, capillary     Status: Abnormal   Collection Time: 09/09/14  9:04 PM  Result Value Ref Range   Glucose-Capillary 186 (H) 70 - 99 mg/dL  CBC     Status: Abnormal   Collection Time: 09/10/14  3:59 AM  Result Value Ref Range   WBC 6.5  4.0 - 10.5 K/uL   RBC 4.04 (L) 4.22 - 5.81 MIL/uL   Hemoglobin 13.5 13.0 - 17.0 g/dL   HCT 38.1 (L) 39.0 - 52.0 %   MCV 94.3 78.0 - 100.0 fL   MCH 33.4 26.0 - 34.0 pg   MCHC 35.4 30.0 - 36.0 g/dL   RDW 14.2 11.5 - 15.5 %   Platelets 84 (L) 150 - 400 K/uL  Protime-INR     Status: Abnormal   Collection Time: 09/10/14  3:59 AM  Result Value Ref Range   Prothrombin Time 22.6 (H) 11.6 - 15.2 seconds   INR 1.97 (H) 0.00 - 1.49  Heparin level (unfractionated)     Status: None   Collection Time: 09/10/14  3:59 AM  Result Value Ref Range   Heparin Unfractionated 0.38 0.30 - 0.70 IU/mL  Glucose, capillary     Status: Abnormal   Collection Time: 09/10/14  6:07 AM  Result Value Ref Range   Glucose-Capillary 100 (H) 70 - 99 mg/dL    Studies/Results: No results found.  Medications: Reviewed   @  PROBHOSP@  1.  HTN  Still high despite increase in meds  A little lower last night and this AM   WIll follow  Prob need titration  Would not advance too fast    2.  Elevated trop  Minimal elevation that is rel flat  No CP today   With CAD and CP yesterday plan for myoview today Records pending from [facility].    LOS: 2 days   Dorris Carnes 09/10/2014, 7:51 AM

## 2014-09-11 DIAGNOSIS — I252 Old myocardial infarction: Secondary | ICD-10-CM | POA: Diagnosis not present

## 2014-09-11 DIAGNOSIS — I249 Acute ischemic heart disease, unspecified: Secondary | ICD-10-CM | POA: Diagnosis present

## 2014-09-11 DIAGNOSIS — E785 Hyperlipidemia, unspecified: Secondary | ICD-10-CM | POA: Diagnosis present

## 2014-09-11 DIAGNOSIS — Z951 Presence of aortocoronary bypass graft: Secondary | ICD-10-CM | POA: Diagnosis not present

## 2014-09-11 DIAGNOSIS — Z794 Long term (current) use of insulin: Secondary | ICD-10-CM | POA: Diagnosis not present

## 2014-09-11 DIAGNOSIS — I5022 Chronic systolic (congestive) heart failure: Secondary | ICD-10-CM

## 2014-09-11 DIAGNOSIS — Z8673 Personal history of transient ischemic attack (TIA), and cerebral infarction without residual deficits: Secondary | ICD-10-CM | POA: Diagnosis not present

## 2014-09-11 DIAGNOSIS — G4733 Obstructive sleep apnea (adult) (pediatric): Secondary | ICD-10-CM | POA: Diagnosis present

## 2014-09-11 DIAGNOSIS — E118 Type 2 diabetes mellitus with unspecified complications: Secondary | ICD-10-CM | POA: Diagnosis present

## 2014-09-11 DIAGNOSIS — I2510 Atherosclerotic heart disease of native coronary artery without angina pectoris: Secondary | ICD-10-CM | POA: Diagnosis present

## 2014-09-11 DIAGNOSIS — I10 Essential (primary) hypertension: Secondary | ICD-10-CM | POA: Diagnosis present

## 2014-09-11 DIAGNOSIS — Z7901 Long term (current) use of anticoagulants: Secondary | ICD-10-CM | POA: Diagnosis not present

## 2014-09-11 DIAGNOSIS — Z95810 Presence of automatic (implantable) cardiac defibrillator: Secondary | ICD-10-CM | POA: Diagnosis not present

## 2014-09-11 DIAGNOSIS — Z7902 Long term (current) use of antithrombotics/antiplatelets: Secondary | ICD-10-CM | POA: Diagnosis not present

## 2014-09-11 DIAGNOSIS — R9439 Abnormal result of other cardiovascular function study: Secondary | ICD-10-CM | POA: Diagnosis not present

## 2014-09-11 DIAGNOSIS — D696 Thrombocytopenia, unspecified: Secondary | ICD-10-CM | POA: Diagnosis present

## 2014-09-11 DIAGNOSIS — I4891 Unspecified atrial fibrillation: Secondary | ICD-10-CM | POA: Diagnosis present

## 2014-09-11 DIAGNOSIS — Z955 Presence of coronary angioplasty implant and graft: Secondary | ICD-10-CM | POA: Diagnosis not present

## 2014-09-11 DIAGNOSIS — F4310 Post-traumatic stress disorder, unspecified: Secondary | ICD-10-CM | POA: Diagnosis present

## 2014-09-11 DIAGNOSIS — I480 Paroxysmal atrial fibrillation: Secondary | ICD-10-CM | POA: Diagnosis present

## 2014-09-11 DIAGNOSIS — I255 Ischemic cardiomyopathy: Secondary | ICD-10-CM | POA: Diagnosis present

## 2014-09-11 DIAGNOSIS — Z7982 Long term (current) use of aspirin: Secondary | ICD-10-CM | POA: Diagnosis not present

## 2014-09-11 LAB — CBC
HEMATOCRIT: 38.8 % — AB (ref 39.0–52.0)
HEMOGLOBIN: 13.5 g/dL (ref 13.0–17.0)
MCH: 32.9 pg (ref 26.0–34.0)
MCHC: 34.8 g/dL (ref 30.0–36.0)
MCV: 94.6 fL (ref 78.0–100.0)
PLATELETS: 89 10*3/uL — AB (ref 150–400)
RBC: 4.1 MIL/uL — ABNORMAL LOW (ref 4.22–5.81)
RDW: 14.3 % (ref 11.5–15.5)
WBC: 6.6 10*3/uL (ref 4.0–10.5)

## 2014-09-11 LAB — BASIC METABOLIC PANEL
Anion gap: 5 (ref 5–15)
BUN: 25 mg/dL — AB (ref 6–23)
CALCIUM: 8.8 mg/dL (ref 8.4–10.5)
CO2: 28 mmol/L (ref 19–32)
CREATININE: 1.33 mg/dL (ref 0.50–1.35)
Chloride: 105 mEq/L (ref 96–112)
GFR calc Af Amer: 61 mL/min — ABNORMAL LOW (ref >90)
GFR calc non Af Amer: 53 mL/min — ABNORMAL LOW (ref >90)
Glucose, Bld: 148 mg/dL — ABNORMAL HIGH (ref 70–99)
Potassium: 4.7 mmol/L (ref 3.5–5.1)
Sodium: 138 mmol/L (ref 135–145)

## 2014-09-11 LAB — PROTIME-INR
INR: 1.99 — AB (ref 0.00–1.49)
INR: 1.91 — ABNORMAL HIGH (ref 0.00–1.49)
PROTHROMBIN TIME: 22.8 s — AB (ref 11.6–15.2)
Prothrombin Time: 22 seconds — ABNORMAL HIGH (ref 11.6–15.2)

## 2014-09-11 LAB — GLUCOSE, CAPILLARY
GLUCOSE-CAPILLARY: 58 mg/dL — AB (ref 70–99)
GLUCOSE-CAPILLARY: 133 mg/dL — AB (ref 70–99)
Glucose-Capillary: 78 mg/dL (ref 70–99)
Glucose-Capillary: 129 mg/dL — ABNORMAL HIGH (ref 70–99)
Glucose-Capillary: 210 mg/dL — ABNORMAL HIGH (ref 70–99)
Glucose-Capillary: 55 mg/dL — ABNORMAL LOW (ref 70–99)

## 2014-09-11 LAB — HEPARIN LEVEL (UNFRACTIONATED)
Heparin Unfractionated: 0.3 IU/mL (ref 0.30–0.70)
Heparin Unfractionated: 0.21 IU/mL — ABNORMAL LOW (ref 0.30–0.70)

## 2014-09-11 MED ORDER — HEPARIN (PORCINE) IN NACL 100-0.45 UNIT/ML-% IJ SOLN
2000.0000 [IU]/h | INTRAMUSCULAR | Status: DC
  Administered 2014-09-11: 1800 [IU]/h via INTRAVENOUS
  Administered 2014-09-13 (×2): 2000 [IU]/h via INTRAVENOUS
  Filled 2014-09-11 (×7): qty 250

## 2014-09-11 NOTE — Progress Notes (Signed)
Inpatient Diabetes Program Recommendations  AACE/ADA: New Consensus Statement on Inpatient Glycemic Control (2013)  Target Ranges:  Prepandial:   less than 140 mg/dL      Peak postprandial:   less than 180 mg/dL (1-2 hours)      Critically ill patients:  140 - 180 mg/dL  Results for Vernon Briggs, Vernon Briggs (MRN [id number]) as of 09/11/2014 11:03  Ref. Range 09/10/2014 20:57 09/10/2014 21:24  Glucose-Capillary Latest Range: 70-99 mg/dL 58 (L) 78   Inpatient Diabetes Program Recommendations Insulin - Basal: Lantus 10 units (home dose 55 units) Correction (SSI): moderate scale Q4 if NPO and TID + HS if eating  Oral Agents: not recommended during hospitalization   Thank you  Raoul Pitch BSN, RN,CDE Inpatient Diabetes Coordinator [phone] (team pager)

## 2014-09-11 NOTE — Progress Notes (Signed)
UR completed 

## 2014-09-11 NOTE — Progress Notes (Signed)
Subjective: No CP  No SOB   Objective: Filed Vitals:   09/10/14 1025 09/10/14 1406 09/10/14 2008 09/11/14 0426  BP: 165/77 117/63 156/67 149/63  Pulse: 70 61 62 59  Temp:  98.5 F (36.9 C) 98.2 F (36.8 C) 98.2 F (36.8 C)  TempSrc:  Oral Oral Oral  Resp:  18 18 18   Height:      Weight:      SpO2:  99% 98% 100%   Weight change:   Intake/Output Summary (Last 24 hours) at 09/11/14 0932 Last data filed at 09/10/14 1811  Gross per 24 hour  Intake    240 ml  Output      0 ml  Net    240 ml    General: Alert, awake, oriented x3, in no acute distress Neck:  JVP is normal Heart: Regular rate and rhythm, without murmurs, rubs, gallops.  Lungs: Clear to auscultation.  No rales or wheezes. Exemities:  No edema.   Neuro: Grossly intact, nonfocal.    Lab Results: Results for orders placed or performed during the hospital encounter of 09/08/14 (from the past 24 hour(s))  Glucose, capillary     Status: Abnormal   Collection Time: 09/10/14 11:22 AM  Result Value Ref Range   Glucose-Capillary 159 (H) 70 - 99 mg/dL  Heparin level (unfractionated)     Status: Abnormal   Collection Time: 09/10/14  1:15 PM  Result Value Ref Range   Heparin Unfractionated 0.27 (L) 0.30 - 0.70 IU/mL  Glucose, capillary     Status: Abnormal   Collection Time: 09/10/14  4:04 PM  Result Value Ref Range   Glucose-Capillary 159 (H) 70 - 99 mg/dL  Basic metabolic panel     Status: Abnormal   Collection Time: 09/10/14  5:30 PM  Result Value Ref Range   Sodium 142 135 - 145 mmol/L   Potassium 4.1 3.5 - 5.1 mmol/L   Chloride 104 96 - 112 mEq/L   CO2 30 19 - 32 mmol/L   Glucose, Bld 147 (H) 70 - 99 mg/dL   BUN 23 6 - 23 mg/dL   Creatinine, Ser 1.52 (H) 0.50 - 1.35 mg/dL   Calcium 9.0 8.4 - 10.5 mg/dL   GFR calc non Af Amer 45 (L) >90 mL/min   GFR calc Af Amer 52 (L) >90 mL/min   Anion gap 8 5 - 15  Glucose, capillary     Status: Abnormal   Collection Time: 09/10/14  8:57 PM  Result Value Ref  Range   Glucose-Capillary 58 (L) 70 - 99 mg/dL  Glucose, capillary     Status: None   Collection Time: 09/10/14  9:24 PM  Result Value Ref Range   Glucose-Capillary 78 70 - 99 mg/dL  CBC     Status: Abnormal   Collection Time: 09/11/14  3:54 AM  Result Value Ref Range   WBC 6.6 4.0 - 10.5 K/uL   RBC 4.10 (L) 4.22 - 5.81 MIL/uL   Hemoglobin 13.5 13.0 - 17.0 g/dL   HCT 38.8 (L) 39.0 - 52.0 %   MCV 94.6 78.0 - 100.0 fL   MCH 32.9 26.0 - 34.0 pg   MCHC 34.8 30.0 - 36.0 g/dL   RDW 14.3 11.5 - 15.5 %   Platelets 89 (L) 150 - 400 K/uL  Protime-INR     Status: Abnormal   Collection Time: 09/11/14  3:54 AM  Result Value Ref Range   Prothrombin Time 22.8 (H) 11.6 - 15.2 seconds  INR 1.99 (H) 0.00 - 1.49  Heparin level (unfractionated)     Status: None   Collection Time: 09/11/14  3:54 AM  Result Value Ref Range   Heparin Unfractionated 0.30 0.30 - 0.70 IU/mL  Basic metabolic panel     Status: Abnormal   Collection Time: 09/11/14  4:45 AM  Result Value Ref Range   Sodium 138 135 - 145 mmol/L   Potassium 4.7 3.5 - 5.1 mmol/L   Chloride 105 96 - 112 mEq/L   CO2 28 19 - 32 mmol/L   Glucose, Bld 148 (H) 70 - 99 mg/dL   BUN 25 (H) 6 - 23 mg/dL   Creatinine, Ser 1.33 0.50 - 1.35 mg/dL   Calcium 8.8 8.4 - 10.5 mg/dL   GFR calc non Af Amer 53 (L) >90 mL/min   GFR calc Af Amer 61 (L) >90 mL/min   Anion gap 5 5 - 15    Studies/Results: Nm Myocar Multi W/spect W/wall Motion / Ef  09/10/2014   CLINICAL DATA:  69 year old male with h/o CAD, S/P CABG admitted with chest pain. Evaluate for ischemia.  EXAM: Lexiscan Myovue  TECHNIQUE: The patient received IV Lexiscan .4mg  over 15 seconds. 33.0 mCi of Technetium 56m Sestamibi injected at 30 seconds. Quantitative SPECT images were obtained in the vertical, horizontal and short axis planes after a 45 minute delay. Rest images were obtained with similar planes and delay using 10.2 mCi of Technetium 1m Sestamibi.  FINDINGS: ECG: Resting: SR,  non-specific ST-T wave abnormalities. Stress: No significant changes.  Symptoms:  Mild chest pain and SOB.  RAW Data: There is significant extracardiac activity that is not impairing ability to read.  Quantitative Gated SPECT EF: Akinesis of the basal and mid inferior and inferoseptal walls, apical anterior wall and of the true apex. Paradoxical septal motion. LVEF 38%.  Perfusion Images:  Stress: There is is decreased uptake in the basal and mid inferior, inferoseptal walls, basal anterolateral, inferolateral, mid inferolateral, apical lateral, anterior and in the true apex.  Rest: There is is decreased uptake in the basal and mid inferior, inferoseptal walls, apical anterior and in the true apex.  IMPRESSION: This is a severe risk study with 3 defects:  1. A medium size, severe severity, irreversible defect in the basal and mid inferior and inferoseptal walls (RCA territory), consistent with an old infarct and no ischemia.  2. A small area, severe severity, irreversible defect in the apical anterior wall and in the true apex (distal LAD territory) consistent with an old infarct and no ischemia.  3. A medium size, moderate severity reversible defect in the basal anterolateral, basal and mid inferolateral, and apical lateral wall (LCX/OM territory) consistent with ischemia (SDS 6).  There is akinesis of the basal and mid inferior and inferoseptal walls, apical anterior wall and of the true apex with paradoxical septal motion and overall moderately decreased left ventricular systolic function (LVEF 61%).  Ena Dawley   Electronically Signed   By: Ena Dawley   On: 09/10/2014 15:30    Medications: Reviewed   @PROBHOSP @  1  CAD  Patinet with mild bump in trop on admit  Has noted increased dyspnea with exertion  Myoview results above  Plan on cath today  INR is 1.99 this am  Will repeat later  May delay.  2.  Afib  COntniue anticoagulation  Will need bridging with history of CVA    3.  Chronic  systolic CHF  Volume is pretty good  WIll need to follow with hydration.    4  HTN  BP is MUCH better  Continue meds    [facility] received [facility]  On review:  Cardiac     CABG in 2000 at [place] in 11/2009.      Myoview in 12/13:  LVEF 32%  Consistent with MI in LAD      distribtion.  S/p ICD ( [employer])      Afib   Fatigue     Type 2 DM HTN PTSD HL CVA  LOS: 3 days   Dorris Carnes 09/11/2014, 9:32 AM

## 2014-09-11 NOTE — Progress Notes (Signed)
Pt made aware that cath would be done on Monday due to elevated INR.  Pt understanding.

## 2014-09-11 NOTE — Progress Notes (Addendum)
ANTICOAGULATION CONSULT NOTE - Follow Up Consult  Pharmacy Consult for Heparin/Warfarin  Indication: chest pain/ACS, afib  No Known Allergies  Patient Measurements: Height: 6' (182.9 cm) Weight = 118 kg IBW/kg (Calculated) : 77.6 Heparin dosing weight  103.5 kg  Vital Signs: Temp: 98.2 F (36.8 C) (01/08 0426) Temp Source: Oral (01/08 0426) BP: 149/63 mmHg (01/08 0426) Pulse Rate: 59 (01/08 0426)  Labs:  Recent Labs  09/08/14 2134  09/09/14 0445 09/09/14 0933  09/09/14 1601  09/10/14 0359 09/10/14 1315 09/10/14 1730 09/11/14 0354 09/11/14 0445  HGB 13.8  --   --   --   --   --   --  13.5  --   --  13.5  --   HCT 39.2  --   --   --   --   --   --  38.1*  --   --  38.8*  --   PLT 99*  --   --   --   --   --   --  84*  --   --  89*  --   LABPROT 20.0*  --  21.2*  --   --   --   --  22.6*  --   --  22.8*  --   INR 1.68*  --  1.82*  --   --   --   --  1.97*  --   --  1.99*  --   HEPARINUNFRC  --   --   --   --   < >  --   < > 0.38 0.27*  --  0.30  --   CREATININE 1.42*  --   --   --   --   --   --   --   --  1.52*  --  1.33  TROPONINI 0.05*  < > 0.15* 0.16*  --  0.15*  --   --   --   --   --   --   < > = values in this interval not displayed.  Estimated Creatinine Clearance: 68.5 mL/min (by C-G formula based on Cr of 1.33).   Medical History: Past Medical History  Diagnosis Date  . Hypertension   . Diabetes mellitus without complication   . Stroke   . CHF (congestive heart failure)   . Coronary artery disease   . Myocardial infarction     Assessment: 8 YOM on coumadin PTA for Afib, currently on IV heparin for ACS, myoview abnormal, plan for Cath this evening. Coumadin on hold last night INR 1.99, will resume after cath per cardiology. HL is 0.3 low end therapeutic on 1650 units/hr. hgb stable 13.5, plt 89, low but stable. No bleeding noted per chart.  Coumadin PTA dose: 7.5 mg on Tuesday, Wednesday, Thursday, Saturday and Sunday, 5mg  on Monday and  Friday   Goal of Therapy:  Heparin level 0.3-0.7 units/ml Monitor platelets by anticoagulation protocol: Yes   Plan:  - Continue heparin rate at 1650 units/hr given low platelet and elevated INR - f/u after cath (PA ordered pharmacy to resume coumadin after cath on 1/8) - Daily CBC/HL/INR - Monitor for bleeding  Maryanna Shape, PharmD, BCPS  Clinical Pharmacist  Pager: [phone]   ===========================   Addendum: - HL 0.21, sub-therapeutic - cath deferred until Monday d/t elevated INR (previously received order to resume Coumadin 1/8 post cath but cath delayed and consult has been discontinued to avoid resuming Coumadin in error.  Please reorder consult when  appropriate). - no bleeding reported; patient has thrombocytopenia   Plan: - Increase heparin gtt to 1800 units/hr - F/U AM labs    Rickayla Wieland D. Mina Marble, PharmD, BCPS Pager:  [phone] 09/11/2014, 8:39 PM

## 2014-09-12 DIAGNOSIS — Z8673 Personal history of transient ischemic attack (TIA), and cerebral infarction without residual deficits: Secondary | ICD-10-CM

## 2014-09-12 DIAGNOSIS — I25119 Atherosclerotic heart disease of native coronary artery with unspecified angina pectoris: Secondary | ICD-10-CM

## 2014-09-12 DIAGNOSIS — Z95810 Presence of automatic (implantable) cardiac defibrillator: Secondary | ICD-10-CM

## 2014-09-12 DIAGNOSIS — R931 Abnormal findings on diagnostic imaging of heart and coronary circulation: Secondary | ICD-10-CM

## 2014-09-12 LAB — CBC
HEMATOCRIT: 38.2 % — AB (ref 39.0–52.0)
Hemoglobin: 13.4 g/dL (ref 13.0–17.0)
MCH: 33.3 pg (ref 26.0–34.0)
MCHC: 35.1 g/dL (ref 30.0–36.0)
MCV: 94.8 fL (ref 78.0–100.0)
Platelets: 85 10*3/uL — ABNORMAL LOW (ref 150–400)
RBC: 4.03 MIL/uL — ABNORMAL LOW (ref 4.22–5.81)
RDW: 14.3 % (ref 11.5–15.5)
WBC: 7.2 10*3/uL (ref 4.0–10.5)

## 2014-09-12 LAB — GLUCOSE, CAPILLARY
GLUCOSE-CAPILLARY: 80 mg/dL (ref 70–99)
Glucose-Capillary: 145 mg/dL — ABNORMAL HIGH (ref 70–99)
Glucose-Capillary: 160 mg/dL — ABNORMAL HIGH (ref 70–99)
Glucose-Capillary: 87 mg/dL (ref 70–99)
Glucose-Capillary: 172 mg/dL — ABNORMAL HIGH (ref 70–99)
Glucose-Capillary: 152 mg/dL — ABNORMAL HIGH (ref 70–99)

## 2014-09-12 LAB — HEPARIN LEVEL (UNFRACTIONATED): Heparin Unfractionated: 0.4 IU/mL (ref 0.30–0.70)

## 2014-09-12 LAB — PROTIME-INR
INR: 1.63 — AB (ref 0.00–1.49)
Prothrombin Time: 19.5 seconds — ABNORMAL HIGH (ref 11.6–15.2)

## 2014-09-12 MED ORDER — SODIUM CHLORIDE 0.9 % IJ SOLN
3.0000 mL | Freq: Two times a day (BID) | INTRAMUSCULAR | Status: DC
  Administered 2014-09-12: 3 mL via INTRAVENOUS

## 2014-09-12 MED ORDER — SODIUM CHLORIDE 0.9 % IJ SOLN: 3.0000 mL | INTRAMUSCULAR | Status: DC | PRN

## 2014-09-12 MED ORDER — SODIUM CHLORIDE 0.9 % IV SOLN: 250.0000 mL | INTRAVENOUS | Status: DC | PRN

## 2014-09-12 MED ORDER — SODIUM CHLORIDE 0.9 % IV SOLN
1.0000 mL/kg/h | INTRAVENOUS | Status: DC
  Administered 2014-09-14: 1 mL/kg/h via INTRAVENOUS

## 2014-09-12 MED ORDER — ASPIRIN 81 MG PO CHEW
81.0000 mg | CHEWABLE_TABLET | ORAL | Status: AC
  Administered 2014-09-14: 81 mg via ORAL
  Filled 2014-09-12: qty 1

## 2014-09-12 NOTE — Progress Notes (Signed)
Hypoglycemic Event  CBG: 55  Treatment: 15 GM carbohydrate snack  Symptoms: None  Follow-up CBG: Time:2242 CBG Result:81  Possible Reasons for Event: Unknown  Comments/MD notified:RN will continue to monitor    Nichoel Digiulio Olufunke  Remember to initiate Hypoglycemia Order Set & complete

## 2014-09-12 NOTE — Progress Notes (Signed)
Primary cardiologist: [facility] cardiology clinic (seen by Dr. Dorris Carnes here)  Seen for followup: Hypertensive urgency, abnormal troponin I  Subjective:    Eating lunch. No chest pain or shortness of breath at rest.  Objective:   Temp:  [97.8 F (36.6 C)-98.4 F (36.9 C)] 98.1 F (36.7 C) (01/09 0612) Pulse Rate:  [57-67] 59 (01/09 0612) Resp:  [18] 18 (01/09 0612) BP: (113-154)/(62-81) 113/62 mmHg (01/09 0612) SpO2:  [98 %-100 %] 100 % (01/09 0612) Last BM Date: 09/04/14  Filed Weights   09/08/14 2035 09/09/14 0338  Weight: 260 lb (117.935 kg) 260 lb (117.935 kg)    Intake/Output Summary (Last 24 hours) at 09/12/14 1204 Last data filed at 09/12/14 0900  Gross per 24 hour  Intake    360 ml  Output      0 ml  Net    360 ml    Telemetry: Sinus rhythm.  Exam:  General: Appears comfortable at rest.  Lungs: Clear, nonlabored.  Cardiac: RRR, soft systolic murmur, no gallop.  Abdomen: NABS.  Extremities: No pitting edema.   Lab Results:  Basic Metabolic Panel:  Recent Labs Lab 09/08/14 2134 09/10/14 1730 09/11/14 0445  NA 138 142 138  K 4.1 4.1 4.7  CL 104 104 105  CO2 25 30 28   GLUCOSE 169* 147* 148*  BUN 29* 23 25*  CREATININE 1.42* 1.52* 1.33  CALCIUM 9.0 9.0 8.8    Liver Function Tests:  Recent Labs Lab 09/08/14 2134  AST 36  ALT 34  ALKPHOS 65  BILITOT 0.8  PROT 5.9*  ALBUMIN 3.5    CBC:  Recent Labs Lab 09/10/14 0359 09/11/14 0354 09/12/14 0319  WBC 6.5 6.6 7.2  HGB 13.5 13.5 13.4  HCT 38.1* 38.8* 38.2*  MCV 94.3 94.6 94.8  PLT 84* 89* 85*    Cardiac Enzymes:  Recent Labs Lab 09/09/14 0445 09/09/14 0933 09/09/14 1601  TROPONINI 0.15* 0.16* 0.15*    Coagulation:  Recent Labs Lab 09/11/14 0354 09/11/14 1140 09/12/14 0319  INR 1.99* 1.91* 1.63*    Echocardiogram 09/09/14: Study Conclusions  - Left ventricle: The cavity size was normal. Wall thickness was increased in a pattern of mild LVH.  Systolic function was moderately reduced. The estimated ejection fraction was in the range of 35% to 40%. Akinesis of the midanteroseptal and anterior myocardium. Doppler parameters are consistent with abnormal left ventricular relaxation (grade 1 diastolic dysfunction). Indeterminate mean left atrial filling pressures. - Aortic valve: There was mild regurgitation. - Mitral valve: Calcified annulus. - Left atrium: The atrium was mildly dilated.  Impressions:  - Little change on direct comparison with study from February 2014.   [place] 09/10/14: IMPRESSION: This is a severe risk study with 3 defects:  1. A medium size, severe severity, irreversible defect in the basal and mid inferior and inferoseptal walls (RCA territory), consistent with an old infarct and no ischemia.  2. A small area, severe severity, irreversible defect in the apical anterior wall and in the true apex (distal LAD territory) consistent with an old infarct and no ischemia.  3. A medium size, moderate severity reversible defect in the basal anterolateral, basal and mid inferolateral, and apical lateral wall (LCX/OM territory) consistent with ischemia (SDS 6).  There is akinesis of the basal and mid inferior and inferoseptal walls, apical anterior wall and of the true apex with paradoxical septal motion and overall moderately decreased left ventricular systolic function (LVEF 38%).  Electronically Signed  By: Houston Siren  Nelson  On: 09/10/2014 15:30   Medications:   Scheduled Medications: . amLODipine  5 mg Oral BID  . aspirin EC  81 mg Oral Daily  . carvedilol  12.5 mg Oral BID WC  . clopidogrel  75 mg Oral Daily  . FLUoxetine  20 mg Oral QHS  . furosemide  20 mg Oral Daily  . glipiZIDE  10 mg Oral BID AC  . hydrALAZINE  75 mg Oral TID  . insulin aspart  16 Units Subcutaneous BID AC  . isosorbide dinitrate  10 mg Oral BID  . lisinopril  40 mg Oral Daily  . magnesium oxide   400 mg Oral BID  . omega-3 acid ethyl esters  3 g Oral BID  . rosuvastatin  20 mg Oral Daily  . sodium chloride  3 mL Intravenous Q12H  . spironolactone  25 mg Oral Daily     Infusions: . heparin 1,800 Units/hr (09/11/14 2100)  . nitroGLYCERIN Stopped (09/09/14 0155)     PRN Medications:  sodium chloride, acetaminophen, nitroGLYCERIN, ondansetron (ZOFRAN) IV, sodium chloride   Assessment:   1. Hypertensive urgency, blood pressure control much better at this point.  2. Abnormal troponin I, peak 0.16 in the setting of above.  3. Known multivessel CAD status post prior CABG at [facility]. Inpatient Myoview as noted above abnormal with evidence of scar and ischemia, reduction in LVEF to the range of 35-40%.  4. Type 2 diabetes mellitus.  5. Prior history of left frontal stroke.  6. Paroxysmal atrial fibrillation, on Coumadin as an outpatient.  7. Presumed ischemic cardiomyopathy status post [employer] ICD.   Plan/Discussion:    Patient scheduled for diagnostic heart catheterization on Monday. Coumadin has been held with heparin bridge, current INR 1.6. At this time he continues on aspirin, Norvasc, Coreg, Plavix, Lasix, hydralazine, Isordil, lisinopril, Aldactone, and Crestor. Follow-up lab work a.m.   Satira Sark, M.D., F.A.C.C.

## 2014-09-12 NOTE — Progress Notes (Signed)
ANTICOAGULATION CONSULT NOTE - Follow Up Consult  Pharmacy Consult for Heparin (warfarin on hold) Indication: chest pain/ACS and atrial fibrillation   Labs:  Recent Labs  09/09/14 0933  09/09/14 1601  09/10/14 0359  09/10/14 1730 09/11/14 0354 09/11/14 0445 09/11/14 1140 09/11/14 1945 09/12/14 0319  HGB  --   --   --   < > 13.5  --   --  13.5  --   --   --  13.4  HCT  --   --   --   --  38.1*  --   --  38.8*  --   --   --  38.2*  PLT  --   --   --   --  84*  --   --  89*  --   --   --  85*  LABPROT  --   --   --   < > 22.6*  --   --  22.8*  --  22.0*  --  19.5*  INR  --   --   --   < > 1.97*  --   --  1.99*  --  1.91*  --  1.63*  HEPARINUNFRC  --   < >  --   < > 0.38  < >  --  0.30  --   --  0.21* 0.40  CREATININE  --   --   --   --   --   --  1.52*  --  1.33  --   --   --   TROPONINI 0.16*  --  0.15*  --   --   --   --   --   --   --   --   --   < > = values in this interval not displayed.    Assessment: Therapeutic heparin level x 1 after rate increase  Goal of Therapy:  Heparin level 0.3-0.7 units/ml Monitor platelets by anticoagulation protocol: Yes   Plan:  -Continue heparin at 1800 units/hr -1000 HL -Daily CBC/HL -Monitor for bleeding  Narda Bonds 09/12/2014,5:12 AM

## 2014-09-13 LAB — BASIC METABOLIC PANEL
Anion gap: 9 (ref 5–15)
BUN: 27 mg/dL — ABNORMAL HIGH (ref 6–23)
CO2: 22 mmol/L (ref 19–32)
CREATININE: 1.2 mg/dL (ref 0.50–1.35)
Calcium: 8.4 mg/dL (ref 8.4–10.5)
Chloride: 107 mEq/L (ref 96–112)
GFR, EST AFRICAN AMERICAN: 69 mL/min — AB (ref >90)
GFR, EST NON AFRICAN AMERICAN: 60 mL/min — AB (ref >90)
GLUCOSE: 152 mg/dL — AB (ref 70–99)
Potassium: 4 mmol/L (ref 3.5–5.1)
Sodium: 138 mmol/L (ref 135–145)

## 2014-09-13 LAB — HEPARIN LEVEL (UNFRACTIONATED)
Heparin Unfractionated: 0.18 IU/mL — ABNORMAL LOW (ref 0.30–0.70)
Heparin Unfractionated: 0.18 IU/mL — ABNORMAL LOW (ref 0.30–0.70)
Heparin Unfractionated: 0.38 IU/mL (ref 0.30–0.70)

## 2014-09-13 LAB — CBC
HCT: 33 % — ABNORMAL LOW (ref 39.0–52.0)
Hemoglobin: 11.6 g/dL — ABNORMAL LOW (ref 13.0–17.0)
MCH: 33.7 pg (ref 26.0–34.0)
MCHC: 35.2 g/dL (ref 30.0–36.0)
MCV: 95.9 fL (ref 78.0–100.0)
PLATELETS: 80 10*3/uL — AB (ref 150–400)
RBC: 3.44 MIL/uL — AB (ref 4.22–5.81)
RDW: 14.3 % (ref 11.5–15.5)
WBC: 7.5 10*3/uL (ref 4.0–10.5)

## 2014-09-13 LAB — PROTIME-INR
INR: 1.29 (ref 0.00–1.49)
PROTHROMBIN TIME: 16.3 s — AB (ref 11.6–15.2)

## 2014-09-13 LAB — GLUCOSE, CAPILLARY
GLUCOSE-CAPILLARY: 182 mg/dL — AB (ref 70–99)
GLUCOSE-CAPILLARY: 172 mg/dL — AB (ref 70–99)
Glucose-Capillary: 178 mg/dL — ABNORMAL HIGH (ref 70–99)
Glucose-Capillary: 134 mg/dL — ABNORMAL HIGH (ref 70–99)

## 2014-09-13 NOTE — Progress Notes (Signed)
ANTICOAGULATION CONSULT NOTE - Pharmacy Consult for Heparin  Indication: Afib   Labs:  Recent Labs  09/10/14 1730  09/11/14 0354 09/11/14 0445 09/11/14 1140 09/11/14 1945 09/12/14 0319 09/13/14 0303  HGB  --   < > 13.5  --   --   --  13.4 11.6*  HCT  --   --  38.8*  --   --   --  38.2* 33.0*  PLT  --   --  89*  --   --   --  85* 80*  LABPROT  --   < > 22.8*  --  22.0*  --  19.5* 16.3*  INR  --   < > 1.99*  --  1.91*  --  1.63* 1.29  HEPARINUNFRC  --   --  0.30  --   --  0.21* 0.40 0.18*  CREATININE 1.52*  --   --  1.33  --   --   --  1.20  < > = values in this interval not displayed.   Assessment: 69 yo male with elevated cardiac markers, h/o Afib with Coumadin on hold for cath Monday, for heparin  Goal of Therapy:  Heparin level 0.3-0.7 units/ml Monitor platelets by anticoagulation protocol: Yes   Plan:  Increase Heparin 2000 units/hr Check heparin level in 6 hours.   Leobardo Granlund, Bronson Curb 09/13/2014,5:32 AM

## 2014-09-13 NOTE — Progress Notes (Signed)
Primary cardiologist: [facility] cardiology clinic (seen by Dr. Dorris Carnes here)  Seen for followup: Hypertensive urgency, abnormal troponin I  Subjective:    Sitting in bed side chair. Comfortable without chest pain or shortness of breath.  Objective:   Temp:  [98.1 F (36.7 C)-98.5 F (36.9 C)] 98.4 F (36.9 C) (01/10 0549) Pulse Rate:  [64] 64 (01/10 0549) Resp:  [18-19] 18 (01/10 0549) BP: (120-148)/(58-64) 128/64 mmHg (01/10 0549) SpO2:  [98 %-100 %] 99 % (01/10 0549) Last BM Date: 09/04/14  [facility] Weights   09/08/14 2035 09/09/14 0338  Weight: 260 lb (117.935 kg) 260 lb (117.935 kg)    Intake/Output Summary (Last 24 hours) at 09/13/14 0958 Last data filed at 09/12/14 1700  Gross per 24 hour  Intake    240 ml  Output      0 ml  Net    240 ml    Telemetry: Sinus rhythm.  Exam:  General: Appears comfortable at rest.  Lungs: Clear, nonlabored.  Cardiac: RRR, soft systolic murmur, no gallop.  Abdomen: NABS.  Extremities: No pitting edema.   Lab Results:  Basic Metabolic Panel:  Recent Labs Lab 09/10/14 1730 09/11/14 0445 09/13/14 0303  NA 142 138 138  K 4.1 4.7 4.0  CL 104 105 107  CO2 30 28 22   GLUCOSE 147* 148* 152*  BUN 23 25* 27*  CREATININE 1.52* 1.33 1.20  CALCIUM 9.0 8.8 8.4    Liver Function Tests:  Recent Labs Lab 09/08/14 2134  AST 36  ALT 34  ALKPHOS 65  BILITOT 0.8  PROT 5.9*  ALBUMIN 3.5    CBC:  Recent Labs Lab 09/11/14 0354 09/12/14 0319 09/13/14 0303  WBC 6.6 7.2 7.5  HGB 13.5 13.4 11.6*  HCT 38.8* 38.2* 33.0*  MCV 94.6 94.8 95.9  PLT 89* 85* 80*    Cardiac Enzymes:  Recent Labs Lab 09/09/14 0445 09/09/14 0933 09/09/14 1601  TROPONINI 0.15* 0.16* 0.15*    Coagulation:  Recent Labs Lab 09/11/14 1140 09/12/14 0319 09/13/14 0303  INR 1.91* 1.63* 1.29    Echocardiogram 09/09/14: Study Conclusions  - Left ventricle: The cavity size was normal. Wall thickness was increased in a pattern  of mild LVH. Systolic function was moderately reduced. The estimated ejection fraction was in the range of 35% to 40%. Akinesis of the midanteroseptal and anterior myocardium. Doppler parameters are consistent with abnormal left ventricular relaxation (grade 1 diastolic dysfunction). Indeterminate mean left atrial filling pressures. - Aortic valve: There was mild regurgitation. - Mitral valve: Calcified annulus. - Left atrium: The atrium was mildly dilated.  Impressions:  - Little change on direct comparison with study from February 2014.   [place] 09/10/14: IMPRESSION: This is a severe risk study with 3 defects:  1. A medium size, severe severity, irreversible defect in the basal and mid inferior and inferoseptal walls (RCA territory), consistent with an old infarct and no ischemia.  2. A small area, severe severity, irreversible defect in the apical anterior wall and in the true apex (distal LAD territory) consistent with an old infarct and no ischemia.  3. A medium size, moderate severity reversible defect in the basal anterolateral, basal and mid inferolateral, and apical lateral wall (LCX/OM territory) consistent with ischemia (SDS 6).  There is akinesis of the basal and mid inferior and inferoseptal walls, apical anterior wall and of the true apex with paradoxical septal motion and overall moderately decreased left ventricular systolic function (LVEF 80%).  Electronically Signed  By: Ena Dawley  On: 09/10/2014 15:30   Medications:   Scheduled Medications: . amLODipine  5 mg Oral BID  . [START ON 09/14/2014] aspirin  81 mg Oral Pre-Cath  . aspirin EC  81 mg Oral Daily  . carvedilol  12.5 mg Oral BID WC  . clopidogrel  75 mg Oral Daily  . FLUoxetine  20 mg Oral QHS  . furosemide  20 mg Oral Daily  . glipiZIDE  10 mg Oral BID AC  . hydrALAZINE  75 mg Oral TID  . insulin aspart  16 Units Subcutaneous BID AC  . isosorbide dinitrate  10  mg Oral BID  . lisinopril  40 mg Oral Daily  . magnesium oxide  400 mg Oral BID  . omega-3 acid ethyl esters  3 g Oral BID  . rosuvastatin  20 mg Oral Daily  . sodium chloride  3 mL Intravenous Q12H  . sodium chloride  3 mL Intravenous Q12H  . spironolactone  25 mg Oral Daily    Infusions: . [START ON 09/14/2014] sodium chloride    . heparin 2,000 Units/hr (09/13/14 1583)  . nitroGLYCERIN Stopped (09/09/14 0155)    PRN Medications: sodium chloride, sodium chloride, acetaminophen, nitroGLYCERIN, ondansetron (ZOFRAN) IV, sodium chloride, sodium chloride   Assessment:   1. Hypertensive urgency, blood pressure control much better at this point.  2. Abnormal troponin I, peak 0.16 in the setting of above.  3. Known multivessel CAD status post prior CABG at [facility]. Inpatient Myoview as noted above abnormal with evidence of scar and ischemia, reduction in LVEF to the range of 35-40%.  4. Type 2 diabetes mellitus.  5. Prior history of left frontal stroke.  6. Paroxysmal atrial fibrillation, on Coumadin as an outpatient.  7. Presumed ischemic cardiomyopathy status post [employer] ICD.   Plan/Discussion:    Patient scheduled for diagnostic heart catheterization on Monday. Coumadin has been held with heparin bridge, current INR 1.3. Continues on aspirin, Norvasc, Coreg, Plavix, Lasix, hydralazine, Isordil, lisinopril, Aldactone, and Crestor. No changes for now.   Satira Sark, M.D., F.A.C.C.

## 2014-09-13 NOTE — Progress Notes (Addendum)
ANTICOAGULATION CONSULT NOTE - Follow Up Consult  Pharmacy Consult for Heparin/Warfarin  Indication: chest pain/ACS, afib  No Known Allergies  Patient Measurements: Height: 6' (182.9 cm) Weight = 118 kg IBW/kg (Calculated) : 77.6 Heparin dosing weight  103.5 kg  Vital Signs: Temp: 98.4 F (36.9 C) (01/10 0549) Temp Source: Oral (01/10 0549) BP: 128/64 mmHg (01/10 0549) Pulse Rate: 64 (01/10 0549)  Labs:  Recent Labs  09/10/14 1730  09/11/14 0354 09/11/14 0445 09/11/14 1140  09/12/14 0319 09/13/14 0303 09/13/14 1204  HGB  --   < > 13.5  --   --   --  13.4 11.6*  --   HCT  --   --  38.8*  --   --   --  38.2* 33.0*  --   PLT  --   --  89*  --   --   --  85* 80*  --   LABPROT  --   < > 22.8*  --  22.0*  --  19.5* 16.3*  --   INR  --   < > 1.99*  --  1.91*  --  1.63* 1.29  --   HEPARINUNFRC  --   --  0.30  --   --   < > 0.40 0.18* 0.18*  CREATININE 1.52*  --   --  1.33  --   --   --  1.20  --   < > = values in this interval not displayed.  Estimated Creatinine Clearance: 75.9 mL/min (by C-G formula based on Cr of 1.2).  Medical History: Past Medical History  Diagnosis Date  . Hypertension   . Diabetes mellitus without complication   . Stroke   . CHF (congestive heart failure)   . Coronary artery disease   . Myocardial infarction    Assessment: 30 YOM on coumadin PTA for Afib, currently on IV heparin for ACS, myoview abnormal, plan for Cath on Monday. Coumadin on hold and Heparin started.  His HL was 0.18 on 1650 units/hr. And rate was increased this morning to 2000 units/hr.  Repeated HL was again 0.18.  I spoke with his nurse who reports that the rate had been changed but that there had been multiple alarms with he IV pump which required re-setting.  He reports that the pump is functioning normal now for the last 1 and 1/2 hour.  Coumadin PTA dose: 7.5 mg on Tuesday, Wednesday, Thursday, Saturday and Sunday, 5mg  on Monday and Friday   Goal of Therapy:  Heparin  level 0.3-0.7 units/ml Monitor platelets by anticoagulation protocol: Yes   Plan:  - I will continue current rate of 2000 units/hr and recheck a heparin level later tonight to ensure appropriate rate. - f/u after cath (PA ordered pharmacy to resume coumadin after cath on 1/8) - Daily CBC/HL/INR - Monitor for bleeding  Rober Minion, PharmD., MS Clinical Pharmacist Pager:  [phone] Thank you for allowing pharmacy to be part of this patients care team. 09/13/2014, 1:17 PM  Addendum:  Repeat heparin level is therapeutic.   Plan: Continue heparin at 2000 units/hr. Plan for Cath month. Follow-up daily levels.   Sloan Leiter, PharmD, BCPS Clinical Pharmacist [phone] 09/13/2014, 7:05 PM

## 2014-09-14 ENCOUNTER — Encounter (HOSPITAL_COMMUNITY)
Admission: EM | Disposition: A | Discharge status: Home / Self Care | Payer: Self-pay | Source: Home / Self Care | Attending: Cardiology

## 2014-09-14 ENCOUNTER — Encounter (HOSPITAL_COMMUNITY): Payer: Self-pay | Admitting: Cardiology

## 2014-09-14 DIAGNOSIS — I25110 Atherosclerotic heart disease of native coronary artery with unstable angina pectoris: Secondary | ICD-10-CM

## 2014-09-14 DIAGNOSIS — R778 Other specified abnormalities of plasma proteins: Secondary | ICD-10-CM | POA: Diagnosis present

## 2014-09-14 DIAGNOSIS — Z951 Presence of aortocoronary bypass graft: Secondary | ICD-10-CM

## 2014-09-14 DIAGNOSIS — R7989 Other specified abnormal findings of blood chemistry: Secondary | ICD-10-CM

## 2014-09-14 HISTORY — PX: PERCUTANEOUS CORONARY STENT INTERVENTION (PCI-S): SHX5485

## 2014-09-14 HISTORY — PX: LEFT HEART CATHETERIZATION WITH CORONARY/GRAFT ANGIOGRAM: SHX5450

## 2014-09-14 LAB — BASIC METABOLIC PANEL
ANION GAP: 6 (ref 5–15)
BUN: 29 mg/dL — AB (ref 6–23)
CALCIUM: 8.2 mg/dL — AB (ref 8.4–10.5)
CO2: 22 mmol/L (ref 19–32)
Chloride: 110 mEq/L (ref 96–112)
Creatinine, Ser: 1.33 mg/dL (ref 0.50–1.35)
GFR calc Af Amer: 61 mL/min — ABNORMAL LOW (ref >90)
GFR, EST NON AFRICAN AMERICAN: 53 mL/min — AB (ref >90)
Glucose, Bld: 171 mg/dL — ABNORMAL HIGH (ref 70–99)
Potassium: 4.6 mmol/L (ref 3.5–5.1)
SODIUM: 138 mmol/L (ref 135–145)

## 2014-09-14 LAB — CBC
HCT: 35.8 % — ABNORMAL LOW (ref 39.0–52.0)
Hemoglobin: 12.4 g/dL — ABNORMAL LOW (ref 13.0–17.0)
MCH: 33.9 pg (ref 26.0–34.0)
MCHC: 34.6 g/dL (ref 30.0–36.0)
MCV: 97.8 fL (ref 78.0–100.0)
Platelets: 83 10*3/uL — ABNORMAL LOW (ref 150–400)
RBC: 3.66 MIL/uL — ABNORMAL LOW (ref 4.22–5.81)
RDW: 14.3 % (ref 11.5–15.5)
WBC: 7.2 10*3/uL (ref 4.0–10.5)

## 2014-09-14 LAB — GLUCOSE, CAPILLARY
GLUCOSE-CAPILLARY: 159 mg/dL — AB (ref 70–99)
GLUCOSE-CAPILLARY: 218 mg/dL — AB (ref 70–99)
GLUCOSE-CAPILLARY: 171 mg/dL — AB (ref 70–99)
GLUCOSE-CAPILLARY: 159 mg/dL — AB (ref 70–99)

## 2014-09-14 LAB — PROTIME-INR
INR: 1.25 (ref 0.00–1.49)
PROTHROMBIN TIME: 15.8 s — AB (ref 11.6–15.2)

## 2014-09-14 LAB — POCT ACTIVATED CLOTTING TIME: Activated Clotting Time: 343 seconds

## 2014-09-14 LAB — HEPARIN LEVEL (UNFRACTIONATED): Heparin Unfractionated: 0.35 IU/mL (ref 0.30–0.70)

## 2014-09-14 SURGERY — LEFT HEART CATHETERIZATION WITH CORONARY/GRAFT ANGIOGRAM
Anesthesia: LOCAL

## 2014-09-14 MED ORDER — HEPARIN SODIUM (PORCINE) 1000 UNIT/ML IJ SOLN
INTRAMUSCULAR | Status: AC
  Filled 2014-09-14: qty 1

## 2014-09-14 MED ORDER — SODIUM CHLORIDE 0.9 % IJ SOLN
3.0000 mL | INTRAMUSCULAR | Status: DC | PRN
  Administered 2014-09-14: 3 mL via INTRAVENOUS
  Filled 2014-09-14: qty 3

## 2014-09-14 MED ORDER — HEPARIN (PORCINE) IN NACL 2-0.9 UNIT/ML-% IJ SOLN
INTRAMUSCULAR | Status: AC
  Filled 2014-09-14: qty 1500

## 2014-09-14 MED ORDER — FENTANYL CITRATE 0.05 MG/ML IJ SOLN
INTRAMUSCULAR | Status: AC
  Filled 2014-09-14: qty 2

## 2014-09-14 MED ORDER — NITROGLYCERIN 1 MG/10 ML FOR IR/CATH LAB
INTRA_ARTERIAL | Status: AC
  Filled 2014-09-14: qty 10

## 2014-09-14 MED ORDER — MORPHINE SULFATE 2 MG/ML IJ SOLN
2.0000 mg | INTRAMUSCULAR | Status: DC | PRN
Start: 2014-09-14 — End: 2014-09-15

## 2014-09-14 MED ORDER — WARFARIN - PHARMACIST DOSING INPATIENT
Freq: Every day | Status: DC
  Administered 2014-09-14: 18:00:00

## 2014-09-14 MED ORDER — SODIUM CHLORIDE 0.9 % IV SOLN: 1.0000 mL/kg/h | INTRAVENOUS | Status: AC

## 2014-09-14 MED ORDER — SODIUM CHLORIDE 0.9 % IJ SOLN: 3.0000 mL | Freq: Two times a day (BID) | INTRAMUSCULAR | Status: DC

## 2014-09-14 MED ORDER — VERAPAMIL HCL 2.5 MG/ML IV SOLN
INTRAVENOUS | Status: AC
  Filled 2014-09-14: qty 2

## 2014-09-14 MED ORDER — WARFARIN SODIUM 7.5 MG PO TABS
7.5000 mg | ORAL_TABLET | Freq: Once | ORAL | Status: AC
  Administered 2014-09-14: 18:00:00 7.5 mg via ORAL
  Filled 2014-09-14 (×2): qty 1

## 2014-09-14 MED ORDER — SODIUM CHLORIDE 0.9 % IV SOLN: 250.0000 mL | INTRAVENOUS | Status: DC | PRN

## 2014-09-14 MED ORDER — MIDAZOLAM HCL 2 MG/2ML IJ SOLN
INTRAMUSCULAR | Status: AC
  Filled 2014-09-14: qty 2

## 2014-09-14 MED ORDER — LIDOCAINE HCL (PF) 1 % IJ SOLN
INTRAMUSCULAR | Status: AC
  Filled 2014-09-14: qty 30

## 2014-09-14 NOTE — Progress Notes (Addendum)
ANTICOAGULATION CONSULT NOTE - Follow Up Consult  Pharmacy Consult for Heparin/Warfarin  Indication: chest pain/ACS, afib  No Known Allergies  Patient Measurements: Height: 6' (182.9 cm) Weight = 118 kg IBW/kg (Calculated) : 77.6 Heparin dosing weight  103.5 kg  Vital Signs: Temp: 98.2 F (36.8 C) (01/11 0536) Temp Source: Oral (01/11 0536) BP: 137/59 mmHg (01/11 0536) Pulse Rate: 63 (01/11 0536)  Labs:  Recent Labs  09/12/14 0319 09/13/14 0303 09/13/14 1204 09/13/14 1815 09/14/14 0311  HGB 13.4 11.6*  --   --  12.4*  HCT 38.2* 33.0*  --   --  35.8*  PLT 85* 80*  --   --  83*  LABPROT 19.5* 16.3*  --   --  15.8*  INR 1.63* 1.29  --   --  1.25  HEPARINUNFRC 0.40 0.18* 0.18* 0.38 0.35  CREATININE  --  1.20  --   --  1.33    Estimated Creatinine Clearance: 68.5 mL/min (by C-G formula based on Cr of 1.33).  Medical History: Past Medical History  Diagnosis Date  . Hypertension   . Diabetes mellitus without complication   . Stroke   . CHF (congestive heart failure)   . CAD in native artery 1999  . Myocardial infarction   . S/P CABG x 4 1999    LIMA-LAD, SVG-OM1, SVG-RI, SVG-dRCA   Assessment: 68 YOM on coumadin PTA for Afib, currently on IV heparin for ACS, myoview abnormal.  Heparin level at goal on 2000 units/hr, no bleeding issues noted, cbc stable.  Cath this morning.  Coumadin PTA dose: 7.5 mg on Tuesday, Wednesday, Thursday, Saturday and Sunday, 5mg  on Monday and Friday   Goal of Therapy:  Heparin level 0.3-0.7 units/ml Monitor platelets by anticoagulation protocol: Yes   Plan:  - Follow up [facility] plans post cath  Erin Hearing PharmD., BCPS Clinical Pharmacist Pager [phone] 09/14/2014 8:13 AM  Addendum: S/p stent to mid cfx.  Heparin stopped post cath with orders to resume coumadin tonight.  INR 1.25 this am prior to cath. Home coumadin dose listed above. Will give 7.5mg  tonight and follow INR.  09/14/2014 10:10 AM

## 2014-09-14 NOTE — H&P (View-Only) (Signed)
Primary cardiologist: [facility] cardiology clinic (seen by Dr. Dorris Carnes here)  Seen for followup: Hypertensive urgency, abnormal troponin I  Subjective:    Sitting in bed side chair. Comfortable without chest pain or shortness of breath.  Objective:   Temp:  [98.1 F (36.7 C)-98.5 F (36.9 C)] 98.4 F (36.9 C) (01/10 0549) Pulse Rate:  [64] 64 (01/10 0549) Resp:  [18-19] 18 (01/10 0549) BP: (120-148)/(58-64) 128/64 mmHg (01/10 0549) SpO2:  [98 %-100 %] 99 % (01/10 0549) Last BM Date: 09/04/14  [facility] Weights   09/08/14 2035 09/09/14 0338  Weight: 260 lb (117.935 kg) 260 lb (117.935 kg)    Intake/Output Summary (Last 24 hours) at 09/13/14 0958 Last data filed at 09/12/14 1700  Gross per 24 hour  Intake    240 ml  Output      0 ml  Net    240 ml    Telemetry: Sinus rhythm.  Exam:  General: Appears comfortable at rest.  Lungs: Clear, nonlabored.  Cardiac: RRR, soft systolic murmur, no gallop.  Abdomen: NABS.  Extremities: No pitting edema.   Lab Results:  Basic Metabolic Panel:  Recent Labs Lab 09/10/14 1730 09/11/14 0445 09/13/14 0303  NA 142 138 138  K 4.1 4.7 4.0  CL 104 105 107  CO2 30 28 22   GLUCOSE 147* 148* 152*  BUN 23 25* 27*  CREATININE 1.52* 1.33 1.20  CALCIUM 9.0 8.8 8.4    Liver Function Tests:  Recent Labs Lab 09/08/14 2134  AST 36  ALT 34  ALKPHOS 65  BILITOT 0.8  PROT 5.9*  ALBUMIN 3.5    CBC:  Recent Labs Lab 09/11/14 0354 09/12/14 0319 09/13/14 0303  WBC 6.6 7.2 7.5  HGB 13.5 13.4 11.6*  HCT 38.8* 38.2* 33.0*  MCV 94.6 94.8 95.9  PLT 89* 85* 80*    Cardiac Enzymes:  Recent Labs Lab 09/09/14 0445 09/09/14 0933 09/09/14 1601  TROPONINI 0.15* 0.16* 0.15*    Coagulation:  Recent Labs Lab 09/11/14 1140 09/12/14 0319 09/13/14 0303  INR 1.91* 1.63* 1.29    Echocardiogram 09/09/14: Study Conclusions  - Left ventricle: The cavity size was normal. Wall thickness was increased in a pattern  of mild LVH. Systolic function was moderately reduced. The estimated ejection fraction was in the range of 35% to 40%. Akinesis of the midanteroseptal and anterior myocardium. Doppler parameters are consistent with abnormal left ventricular relaxation (grade 1 diastolic dysfunction). Indeterminate mean left atrial filling pressures. - Aortic valve: There was mild regurgitation. - Mitral valve: Calcified annulus. - Left atrium: The atrium was mildly dilated.  Impressions:  - Little change on direct comparison with study from February 2014.   Labette 09/10/14: IMPRESSION: This is a severe risk study with 3 defects:  1. A medium size, severe severity, irreversible defect in the basal and mid inferior and inferoseptal walls (RCA territory), consistent with an old infarct and no ischemia.  2. A small area, severe severity, irreversible defect in the apical anterior wall and in the true apex (distal LAD territory) consistent with an old infarct and no ischemia.  3. A medium size, moderate severity reversible defect in the basal anterolateral, basal and mid inferolateral, and apical lateral wall (LCX/OM territory) consistent with ischemia (SDS 6).  There is akinesis of the basal and mid inferior and inferoseptal walls, apical anterior wall and of the true apex with paradoxical septal motion and overall moderately decreased left ventricular systolic function (LVEF 25%).  Electronically Signed  By: Ena Dawley  On: 09/10/2014 15:30   Medications:   Scheduled Medications: . amLODipine  5 mg Oral BID  . [START ON 09/14/2014] aspirin  81 mg Oral Pre-Cath  . aspirin EC  81 mg Oral Daily  . carvedilol  12.5 mg Oral BID WC  . clopidogrel  75 mg Oral Daily  . FLUoxetine  20 mg Oral QHS  . furosemide  20 mg Oral Daily  . glipiZIDE  10 mg Oral BID AC  . hydrALAZINE  75 mg Oral TID  . insulin aspart  16 Units Subcutaneous BID AC  . isosorbide dinitrate  10  mg Oral BID  . lisinopril  40 mg Oral Daily  . magnesium oxide  400 mg Oral BID  . omega-3 acid ethyl esters  3 g Oral BID  . rosuvastatin  20 mg Oral Daily  . sodium chloride  3 mL Intravenous Q12H  . sodium chloride  3 mL Intravenous Q12H  . spironolactone  25 mg Oral Daily    Infusions: . [START ON 09/14/2014] sodium chloride    . heparin 2,000 Units/hr (09/13/14 4827)  . nitroGLYCERIN Stopped (09/09/14 0155)    PRN Medications: sodium chloride, sodium chloride, acetaminophen, nitroGLYCERIN, ondansetron (ZOFRAN) IV, sodium chloride, sodium chloride   Assessment:   1. Hypertensive urgency, blood pressure control much better at this point.  2. Abnormal troponin I, peak 0.16 in the setting of above.  3. Known multivessel CAD status post prior CABG at [facility]. Inpatient Myoview as noted above abnormal with evidence of scar and ischemia, reduction in LVEF to the range of 35-40%.  4. Type 2 diabetes mellitus.  5. Prior history of left frontal stroke.  6. Paroxysmal atrial fibrillation, on Coumadin as an outpatient.  7. Presumed ischemic cardiomyopathy status post Biotronik ICD.   Plan/Discussion:    Patient scheduled for diagnostic heart catheterization on Monday. Coumadin has been held with heparin bridge, current INR 1.3. Continues on aspirin, Norvasc, Coreg, Plavix, Lasix, hydralazine, Isordil, lisinopril, Aldactone, and Crestor. No changes for now.   Satira Sark, M.D., F.A.C.C.

## 2014-09-14 NOTE — Progress Notes (Signed)
UR completed Danel Requena K. Shiquan Mathieu, RN, BSN, [place], CCM  09/14/2014 3:26 PM

## 2014-09-14 NOTE — Interval H&P Note (Signed)
History and Physical Interval Note:  09/14/2014 7:33 AM  Vernon Briggs  has presented today for surgery, with the diagnosis of Acute Coronary Syndrome, Abnormal Nuclear Stress Test. Active Problems:   Acute coronary syndrome   Hypertensive emergency   Coronary artery disease involving native coronary artery of native heart with angina pectoris   Abnormal nuclear stress test   History of CVA (cerebrovascular accident)   TIA (transient ischemic attack)   Hx of CABG   Hyperlipidemia with target LDL less than 70   Essential hypertension   Automatic implantable cardioverter-defibrillator in situ   Type II diabetes mellitus with complication   S/P CABG x 4   The various methods of treatment have been discussed with the patient and family. After consideration of risks, benefits and other options for treatment, the patient has consented to  Procedure(s): LEFT HEART CATHETERIZATION WITH CORONARY/GRAFT ANGIOGRAM (N/A) +/- PCI as a surgical intervention .  The patient's history has been reviewed, patient examined, no change in status, stable for surgery.  I have reviewed the patient's chart and labs.  Questions were answered to the patient's satisfaction.    Cath Lab Visit (complete for each Cath Lab visit)  Clinical Evaluation Leading to the Procedure:   ACS: Yes.    Non-ACS:    Anginal Classification: CCS III  Anti-ischemic medical therapy: Maximal Therapy (2 or more classes of medications)  Non-Invasive Test Results: High-risk stress test findings: cardiac mortality >3%/year  Prior CABG: Previous CABG  HARDING, DAVID W  AUC for Cath: CAD Assessment (Coronary Angiography With or Without Left Heart Catheterization and/or Left Ventriculography)  Patient Information:    Suspected ACS with newly diagnosed resting myocardial perfusion defect   High ACS Risk Score (e.g.,TIMI, GRACE)  AUC Score:   A (9)   Indication:   4  AUC for Revascularization: TIMI SCORE  Patient  Information:  TIMI Score is 6   A/NSTEMI and high-risk features for short-term risk of death or nonfatal MI Revascularization of the presumed culprit artery  A (9)  Indication: 11; Score: 9 TIMI SCORE  Patient Information:  TIMI Score is 6   A/NSTEMI and high-risk features for short-term risk of death or nonfatal MI  Revascularization of multiple coronary arteries when the culprit artery cannot clearly be determined  A (9)  Indication: 12; Score: 9  HARDING, Leonie Green, M.D., M.S. Interventional Cardiologist   Pager # [phone]

## 2014-09-14 NOTE — Progress Notes (Signed)
TR BAND REMOVAL  LOCATION:    left radial  DEFLATED PER PROTOCOL:    Yes.    TIME BAND OFF / DRESSING APPLIED:    1300   SITE UPON ARRIVAL:    Level 0  SITE AFTER BAND REMOVAL:    Level 0  REVERSE ALLEN'S TEST:     positive  CIRCULATION SENSATION AND MOVEMENT:    Within Normal Limits   Yes.    COMMENTS:   Tolerated  Procedure well

## 2014-09-14 NOTE — CV Procedure (Signed)
CARDIAC CATHETERIZATION And PERCUTANEOUS CORONARY INTERVENTION REPORT  NAME:  Vernon Briggs   MRN: [id number] DOB:  [birth year]   ADMIT DATE: 09/08/2014 Procedure Date: 09/14/2014  INTERVENTIONAL CARDIOLOGIST: Leonie Man, M.D., MS PRIMARY CARE PROVIDER: Default, Provider, MD PRIMARY CARDIOLOGIST: [facility]  PATIENT:  Vernon Briggs is a 69 y.o. male with PMH of CAD since ~1996 -- > CABG x 4 ([place] - LIMA-LAD, SVG-dRCA, SVG-OM1, SVG-RI) with PCI in ~2011 ([place]) with 6 stents (4 in 1 artery) -- report unavailable.  He has DM-2, HTN. HLD & Obesity, ICM s/p ICD in 2013 w/ prior L frontal CVA.  He was admitted to [facility] with HTN Emergency - with elevated Troponins concerning for ACS.  A Myoview Nuclear ST was read as HIGH risk with evidence of prior infarct in the Inferior & anteroapical walls with ischemia in the anterolateral wall. He is referred for invasive evaluation with LHC-Coronary & Graft Angiography +/- PCI.  PRE-OPERATIVE DIAGNOSIS:    Hypertensive Emergency  Acute Coronary Syndrome  Known CAD-CABG & PCI  Ischemic Cardiomyopathy  HIGH Risk Nuclear Stress Test --   AUC for CATH: A (9)   AUC for Revascularization: TIMI Score 6, A (9)  PROCEDURES PERFORMED:    Left Heart Catheterization with Native Coronary & Angiography  via Left Radial Artery   Left Ventriculography - not performed as the patient is just had 2 evaluations of cardiac function  Percutaneous Coronary Intervention of the Mid Left Circumflex 80% ulcerated lesion just following the more proximal stents.  PROMUS PREMIER DES 3.5 mm x 16 mm (4.0 mm proximally, 3.8 mm distal)  PROCEDURE: The patient was brought to the [facility] [facility] Lab in the fasting state and prepped and draped in the usual sterile fashion for LEFT RADIAL artery access. A modified Allen's test was performed on the LEFT wrist demonstrating excellent collateral flow for radial access.   Sterile technique was used  including antiseptics, cap, gloves, gown, hand hygiene, mask and sheet. Skin prep: Chlorhexidine.   Consent: Risks of procedure as well as the alternatives and risks of each were explained to the (patient/caregiver). Consent for procedure obtained.   Time Out: Verified patient identification, verified procedure, site/side was marked, verified correct patient position, special equipment/implants available, medications/allergies/relevent history reviewed, required imaging and test results available. Performed.  Access:   LEFT RADIAL Artery: 6 Fr Sheath -  Seldinger Technique (Angiocath Micropuncture Kit)  Radial Cocktail - 10 mL; IV Heparin 5000 Units   Left Heart Catheterization: 5 Fr Catheters advanced or exchanged over a long exchange safety J-wire; JR4 catheter advanced first.  Right Coronary Artery, SVG-RCA & SVG-OM, SVG-RI Cineangiography: AL-1 Catheter  Left Coronary Artery Cineangiography: JL4 Catheter  LIMA-LAD Cineangiography: JL4 Catheter was exchanged over long-exchange wire for IMA catheter.  LV Hemodynamics (LV Gram): AL-1  Sheath removed in the cardiac catheterization lab with TR band placement for hemostasis.  TR Band: 13  Hours;  09:15 mL air  FINDINGS:  Hemodynamics:   Central Aortic Pressure / Mean: 97/52/69 mmHg  Left Ventricular Pressure / LVEDP: 98/8/17 mmHg  Left Ventriculography: Not performed  EF: 35-40 % by echocardiogram  Coronary Anatomy:  Dominance: Unknown, likely right  Left Main: Large-caliber vessel with distal 30% stenosis. Bifurcates into the nondominant circumflex and LAD. The apparent ramus medius was not visualized. LAD: Proximal 70% stenosis before giving off a small moderate caliber diagonal branch. The vessels and 100 and occluded.  D1: Small moderate caliber vessel that covers the  proximal anterolateral wall. Minimal luminal irregularities.  LIMA-LAD: Widely patent, tortuous graft to the mid LAD. It perfuses down around the  inferolateral apex with minimal luminal irregularities distally. Left Circumflex: Nondominant, large caliber vessel that has a proximal and mid OM both of which are subtotal totally occluded in the mid vessels. The main circumflex terminates as a lateral/inferolateral OM branch. There are at least 2 stents in the proximal to mid section. Just beyond the distal stent there is a focal irregular 80% stenosis at the takeoff on the small marginal branches. The remainder the vessel is relatively free of disease and trifurcates into 3 branches distally along the inferolateral wall.  SVG-OM: Ostial occluded Ramus intermedius: Not seen on native left angiography.  SVG-RI: Ostial occluded  RCA: Large-caliber, likely dominant vessel that is 100 and occluded in the mid vessel. There are left-right collaterals from the circumflex AV groove branch that appeared to fill part of the posterior lateral system. Some left to right collaterals from the graft LAD perfuses part of the PDA distribution.  SVG-distal RCA: Ostial occluded with stents visible.  After reviewing the initial angiography, the culprit lesion was thought to be 80% irregular lesion in the mid circumflex.  Preparation were made to proceed with PCI on this lesion.  Percutaneous Coronary Intervention:  Sheath exchanged for 6 Fr Guide: 6 Fr   XB LAD 3.5 Guidewire: BMW Predilation Balloon: Trek 2.5 mm x 12 mm;   16 Atm x 30 Sec, Stent: Promus Premier DES 3.5 mm x 16 mm;   16 Atm x 30 Sec, postdilated to 18  Atm x 30Sec - Final diameter 3.8 MM Post-dilation Balloon:Gideon Trek   4.0 mm x 8 m;   16 Atm x 45 Sec, Final Diameter: 4.0 mm  Post deployment angiography in multiple views, with and without guidewire in place revealed excellent stent deployment and lesion coverage.  There was no evidence of dissection or perforation.  MEDICATIONS:  Anesthesia:  Local LidoTol  Sedation:   1 mg IV Versed, 50 mcg IV fentanyl ;   Omnipaque Contrast: 190  ml  Anticoagulation:  IV Heparin 5000+ additional 4000 Units ;  Anti-Platelet Agent:  On aspirin plus Plavix  PATIENT DISPOSITION:    The patient was transferred to the PACU holding area in a hemodynamicaly stable, chest pain free condition.  The patient tolerated the procedure well, and there were no complications.  EBL:   <  10  The patient was stable before, during, and after the procedure.  POST-OPERATIVE DIAGNOSIS:    Severe native CAD with occlusion of the proximal LAD after a 70% lesion and first diagonal branch, occluded ramus and medius, occluded OM1 and OM 2 as well as mid RCA. Patent proximal circumflex stents with severe 80% ulcerated lesion distal to circumflex stents treated successfully with a Promus Premier DES stent as noted above.  Patent LIMA-LAD but otherwise occluded SVG to distal RCA, ramus intermedius and OM1.  Ischemic cardiopathy with EF 35-40% -with moderately elevated LVEDP of 17 mm Hg   Chronic Paroxysmal Atrial Fibrillation  PLAN OF CARE:  Transfer to [facility] for post radial PCI care.  Dual antiplatelet therapy will be continued. Will restart warfarin tonight.  Anticipate likely discharge tomorrow if stable.  Continue other CHF medications.    Leonie Man, M.D., M.S. Interventional Cardiologist   Pager # [phone]

## 2014-09-15 DIAGNOSIS — E118 Type 2 diabetes mellitus with unspecified complications: Secondary | ICD-10-CM

## 2014-09-15 DIAGNOSIS — R7989 Other specified abnormal findings of blood chemistry: Secondary | ICD-10-CM

## 2014-09-15 DIAGNOSIS — Z951 Presence of aortocoronary bypass graft: Secondary | ICD-10-CM

## 2014-09-15 DIAGNOSIS — I480 Paroxysmal atrial fibrillation: Secondary | ICD-10-CM | POA: Diagnosis present

## 2014-09-15 DIAGNOSIS — I255 Ischemic cardiomyopathy: Secondary | ICD-10-CM | POA: Diagnosis present

## 2014-09-15 DIAGNOSIS — Z9861 Coronary angioplasty status: Secondary | ICD-10-CM

## 2014-09-15 DIAGNOSIS — I2510 Atherosclerotic heart disease of native coronary artery without angina pectoris: Secondary | ICD-10-CM

## 2014-09-15 DIAGNOSIS — Z7901 Long term (current) use of anticoagulants: Secondary | ICD-10-CM

## 2014-09-15 DIAGNOSIS — E785 Hyperlipidemia, unspecified: Secondary | ICD-10-CM

## 2014-09-15 DIAGNOSIS — D696 Thrombocytopenia, unspecified: Secondary | ICD-10-CM

## 2014-09-15 LAB — GLUCOSE, CAPILLARY: GLUCOSE-CAPILLARY: 155 mg/dL — AB (ref 70–99)

## 2014-09-15 LAB — CBC
HCT: 34.3 % — ABNORMAL LOW (ref 39.0–52.0)
Hemoglobin: 11.7 g/dL — ABNORMAL LOW (ref 13.0–17.0)
MCH: 32.8 pg (ref 26.0–34.0)
MCHC: 34.1 g/dL (ref 30.0–36.0)
MCV: 96.1 fL (ref 78.0–100.0)
Platelets: 71 10*3/uL — ABNORMAL LOW (ref 150–400)
RBC: 3.57 MIL/uL — ABNORMAL LOW (ref 4.22–5.81)
RDW: 14.2 % (ref 11.5–15.5)
WBC: 7.6 10*3/uL (ref 4.0–10.5)

## 2014-09-15 LAB — BASIC METABOLIC PANEL
Anion gap: 3 — ABNORMAL LOW (ref 5–15)
BUN: 29 mg/dL — ABNORMAL HIGH (ref 6–23)
CALCIUM: 8.3 mg/dL — AB (ref 8.4–10.5)
CO2: 24 mmol/L (ref 19–32)
CREATININE: 1.29 mg/dL (ref 0.50–1.35)
Chloride: 111 mEq/L (ref 96–112)
GFR calc non Af Amer: 55 mL/min — ABNORMAL LOW (ref >90)
GFR, EST AFRICAN AMERICAN: 63 mL/min — AB (ref >90)
Glucose, Bld: 180 mg/dL — ABNORMAL HIGH (ref 70–99)
Potassium: 4.2 mmol/L (ref 3.5–5.1)
SODIUM: 138 mmol/L (ref 135–145)

## 2014-09-15 LAB — PROTIME-INR
INR: 1.15 (ref 0.00–1.49)
Prothrombin Time: 14.8 seconds (ref 11.6–15.2)

## 2014-09-15 MED ORDER — NITROGLYCERIN 0.4 MG SL SUBL: 0.4000 mg | SUBLINGUAL_TABLET | SUBLINGUAL | Status: AC | PRN

## 2014-09-15 MED ORDER — AMLODIPINE BESYLATE 10 MG PO TABS: 10.0000 mg | ORAL_TABLET | Freq: Every day | ORAL | Status: DC

## 2014-09-15 MED ORDER — HYDRALAZINE HCL 50 MG PO TABS: 50.0000 mg | ORAL_TABLET | Freq: Three times a day (TID) | ORAL | Status: DC

## 2014-09-15 MED ORDER — AMLODIPINE BESYLATE 10 MG PO TABS
10.0000 mg | ORAL_TABLET | Freq: Every day | ORAL | Status: DC
  Administered 2014-09-15: 10 mg via ORAL
  Filled 2014-09-15: qty 1

## 2014-09-15 MED ORDER — LISINOPRIL 40 MG PO TABS: 40.0000 mg | ORAL_TABLET | Freq: Every day | ORAL | Status: DC

## 2014-09-15 MED ORDER — ACETAMINOPHEN 325 MG PO TABS: 650.0000 mg | ORAL_TABLET | ORAL | Status: AC | PRN

## 2014-09-15 MED ORDER — METFORMIN HCL 500 MG PO TABS: 500.0000 mg | ORAL_TABLET | Freq: Two times a day (BID) | ORAL | Status: DC

## 2014-09-15 NOTE — Discharge Summary (Signed)
Patient ID: Vernon Briggs,  MRN: [id number], DOB/AGE: 71/26/1947 71 y.o.  Admit date: 09/08/2014 Discharge date: 09/15/2014  Primary Care Provider: Default, Provider, MD Primary Cardiologist: [facility]  Discharge Diagnoses  Active Problems:   Hypertensive urgency   Abnormal nuclear stress test   Elevated troponin   CAD S/P CFX DES 09/14/14   Type II diabetes mellitus with complication   Thrombocytopenia-plts 90-70K   Essential hypertension   S/P CABG x 12-1997   Cardiomyopathy, ischemic   PAF (paroxysmal atrial fibrillation)   Chronic anticoagulation-Coumadin   History of CVA (cerebrovascular accident)   Automatic implantable cardioverter-defibrillator in situ   Hyperlipidemia with target LDL less than 70    Procedures: Cath/ CFX DES 09/14/14   Hospital Course:   69 y.o. male with PMH of CAD since ~1996 -- > CABG x 4 in 1999 ([place], SVG-dRCA, SVG-OM1, SVG-RI) with PCI in ~2011 ([place]). He has DM-2, HTN. HLD & Obesity, PAF on Coumadin, prior L frontal CVA, and ICM s/p ICD in 2013 . He was admitted to [facility] 09/08/14 with HTN Emergency - with elevated Troponins concerning for ACS. A Myoview Nuclear ST was read as HIGH risk with evidence of prior infarct in the Inferior & anteroapical walls with ischemia in the anterolateral wall. Echo revealed an EF of 35-40%. Coumadin was held and he underwent cath and CFX DES on 09/14/14 by Dr Ellyn Hack, see his OP note for complete details. The pt tolerated the procedure well. He was seen by Dr Ellyn Hack on the morning of the 12th and felt to be stable for discharge. His Lt wrist is without hematoma, lungs with decreased breath sounds but clear, cardiac RRR. He is discharged on ASA 81mg , Plavix, and Coumadin. He could stop his ASA in a a month but will defer to his cardiologist at [facility].    Discharge Vitals:  Blood pressure 166/53, pulse 67, temperature 97.9 F (36.6 C), temperature source Oral, resp. rate 18, height 6' (1.829 m),  weight 260 lb 12.9 oz (118.3 kg), SpO2 99 %.    Labs: Results for orders placed or performed during the hospital encounter of 09/08/14 (from the past 24 hour(s))  POCT Activated clotting time     Status: None   Collection Time: 09/14/14  8:46 AM  Result Value Ref Range   Activated Clotting Time 343 seconds  Glucose, capillary     Status: Abnormal   Collection Time: 09/14/14 12:44 PM  Result Value Ref Range   Glucose-Capillary 218 (H) 70 - 99 mg/dL  Glucose, capillary     Status: Abnormal   Collection Time: 09/14/14  6:25 PM  Result Value Ref Range   Glucose-Capillary 171 (H) 70 - 99 mg/dL  Glucose, capillary     Status: Abnormal   Collection Time: 09/14/14  9:07 PM  Result Value Ref Range   Glucose-Capillary 159 (H) 70 - 99 mg/dL   Comment 1 Notify RN    Comment 2 Documented in Chart   Protime-INR     Status: None   Collection Time: 09/15/14  4:41 AM  Result Value Ref Range   Prothrombin Time 14.8 11.6 - 15.2 seconds   INR 1.15 0.00 - 7.12  Basic metabolic panel     Status: Abnormal   Collection Time: 09/15/14  4:41 AM  Result Value Ref Range   Sodium 138 135 - 145 mmol/L   Potassium 4.2 3.5 - 5.1 mmol/L   Chloride 111 96 - 112 mEq/L  CO2 24 19 - 32 mmol/L   Glucose, Bld 180 (H) 70 - 99 mg/dL   BUN 29 (H) 6 - 23 mg/dL   Creatinine, Ser 1.29 0.50 - 1.35 mg/dL   Calcium 8.3 (L) 8.4 - 10.5 mg/dL   GFR calc non Af Amer 55 (L) >90 mL/min   GFR calc Af Amer 63 (L) >90 mL/min   Anion gap 3 (L) 5 - 15  CBC     Status: Abnormal   Collection Time: 09/15/14  4:41 AM  Result Value Ref Range   WBC 7.6 4.0 - 10.5 K/uL   RBC 3.57 (L) 4.22 - 5.81 MIL/uL   Hemoglobin 11.7 (L) 13.0 - 17.0 g/dL   HCT 34.3 (L) 39.0 - 52.0 %   MCV 96.1 78.0 - 100.0 fL   MCH 32.8 26.0 - 34.0 pg   MCHC 34.1 30.0 - 36.0 g/dL   RDW 14.2 11.5 - 15.5 %   Platelets 71 (L) 150 - 400 K/uL  Glucose, capillary     Status: Abnormal   Collection Time: 09/15/14  6:45 AM  Result Value Ref Range    Glucose-Capillary 155 (H) 70 - 99 mg/dL    Disposition:    Discharge Medications:    Medication List    TAKE these medications        acetaminophen 325 MG tablet  Commonly known as:  TYLENOL  Take 2 tablets (650 mg total) by mouth every 4 (four) hours as needed for headache or mild pain.     amLODipine 10 MG tablet  Commonly known as:  NORVASC  Take 1 tablet (10 mg total) by mouth daily.     aspirin EC 81 MG tablet  Take 81 mg by mouth daily.     carvedilol 12.5 MG tablet  Commonly known as:  COREG  Take 12.5 mg by mouth 2 (two) times daily with a meal.     clopidogrel 75 MG tablet  Commonly known as:  PLAVIX  Take 75 mg by mouth daily.     cyanocobalamin 1000 MCG/ML injection  Commonly known as:  (VITAMIN B-12)  Inject 1,000 mcg into the muscle every 30 (thirty) days.     FLUoxetine 20 MG capsule  Commonly known as:  PROZAC  Take 20 mg by mouth at bedtime.     furosemide 20 MG tablet  Commonly known as:  LASIX  Take 20 mg by mouth daily.     glipiZIDE 10 MG tablet  Commonly known as:  GLUCOTROL  Take 10 mg by mouth 2 (two) times daily before a meal.     hydrALAZINE 50 MG tablet  Commonly known as:  APRESOLINE  Take 1 tablet (50 mg total) by mouth 3 (three) times daily.     insulin glargine 100 UNIT/ML injection  Commonly known as:  LANTUS  Inject 55 Units into the skin at bedtime.     insulin regular 100 units/mL injection  Commonly known as:  NOVOLIN R,HUMULIN R  Inject 16 Units into the skin 2 (two) times daily before a meal.     isosorbide dinitrate 10 MG tablet  Commonly known as:  ISORDIL  Take 10 mg by mouth 2 (two) times daily.     lisinopril 40 MG tablet  Commonly known as:  PRINIVIL,ZESTRIL  Take 1 tablet (40 mg total) by mouth at bedtime.     magnesium oxide 400 MG tablet  Commonly known as:  MAG-OX  Take 400 mg by mouth 2 (two) times daily.  metFORMIN 500 MG tablet  Commonly known as:  GLUCOPHAGE  Take 1 tablet (500 mg total) by  mouth 2 (two) times daily with a meal.  Start taking on:  09/17/2014     nitroGLYCERIN 0.4 MG SL tablet  Commonly known as:  NITROSTAT  Place 1 tablet (0.4 mg total) under the tongue every 5 (five) minutes x 3 doses as needed for chest pain.     omega-3 acid ethyl esters 1 G capsule  Commonly known as:  LOVAZA  Take 3 g by mouth 2 (two) times daily.     rosuvastatin 20 MG tablet  Commonly known as:  CRESTOR  Take 20 mg by mouth daily.     spironolactone 25 MG tablet  Commonly known as:  ALDACTONE  Take 25 mg by mouth daily.     warfarin 5 MG tablet  Commonly known as:  COUMADIN  Take 5-7.5 mg by mouth daily. Take 1 and 1/2 tablets on Tuesday, Wednesday, Thursday, Saturday and Sunday then take 1 tablet on Monday and Friday         Duration of Discharge Encounter: Greater than 30 minutes including physician time.  Angelena Form PA-C 09/15/2014 8:40 AM

## 2014-09-15 NOTE — Progress Notes (Signed)
CARDIAC REHAB PHASE I   PRE:  Rate/Rhythm: 70 SR    BP: sitting 166/53    SaO2:   MODE:  Ambulation: 700 ft   POST:  Rate/Rhythm: 93 SR    BP: sitting 179/68     SaO2:   Pt with slow pace walk. Able to walk up incline and increased distance but began having leg pain after 600 ft so returned to room. Ed completed. Pt easily distracted but voiced understanding. Discussed HF packet as well including daily wts and low sodium. Unfortunately wife not present but he sts he will share info with her. Pt interested in [facility] and will send referral to [place].  [phone]  Josephina Shih [place] CES, ACSM 09/15/2014 9:14 AM

## 2014-09-15 NOTE — Discharge Instructions (Signed)
Coronary Angiogram With Stent, Care After °Refer to this sheet in the next few weeks. These instructions provide you with information on caring for yourself after your procedure. Your health care provider may also give you more specific instructions. Your treatment has been planned according to current medical practices, but problems sometimes occur. Call your health care provider if you have any problems or questions after your procedure.  °WHAT TO EXPECT AFTER THE PROCEDURE  °The insertion site may be tender for a few days after your procedure. °HOME CARE INSTRUCTIONS  °· Take medicines only as directed by your health care provider. Blood thinners may be prescribed after your procedure to improve blood flow through the stent. °· Change any bandages (dressings) as directed by your health care provider.   °· Check your insertion site every day for redness, swelling, or fluid leaking from the insertion.   °· Do not take baths, swim, or use a hot tub until your health care provider approves. You may shower. Pat the insertion area dry. Do not rub the insertion area with a washcloth or towel.   °· Eat a heart-healthy diet. This should include plenty of fresh fruits and vegetables. Meat should be lean cuts. Avoid the following types of food:   °¨ Food that is high in salt.   °¨ Canned or highly processed food.   °¨ Food that is high in saturated fat or sugar.   °¨ Fried food.   °· Make any other lifestyle changes recommended by your health care provider. This may include:   °¨ Not using any tobacco products including cigarettes, chewing tobacco, or electronic cigarettes.  °¨ Managing your weight.   °¨ Getting regular exercise.   °¨ Managing your blood pressure.   °¨ Limiting your alcohol intake.   °¨ Managing other health problems, such as diabetes.   °· If you need an MRI after your heart stent was placed, be sure to tell the health care provider who orders the MRI that you have a heart stent.   °· Keep all follow-up  visits as directed by your health care provider.   °SEEK IMMEDIATE MEDICAL CARE IF:  °· You develop chest pain, shortness of breath, feel faint, or pass out. °· You have bleeding, swelling larger than a walnut, or drainage from the catheter insertion site. °· You develop pain, discoloration, coldness, or severe bruising in the leg or arm that held the catheter. °· You develop bleeding from any other place such as from the bowels. There may be bright red blood in the urine or stools, or it may appear as black, tarry stools. °· You have a fever or chills. °MAKE SURE YOU: °· Understand these instructions. °· Will watch your condition. °· Will get help right away if you are not doing well or get worse. °Document Released: 03/10/2005 Document Revised: 01/05/2014 Document Reviewed: 01/22/2013 °ExitCare® Patient Information ©2015 [employer]. This information is not intended to replace advice given to you by your health care provider. Make sure you discuss any questions you have with your health care provider. ° °

## 2014-10-20 ENCOUNTER — Telehealth (HOSPITAL_COMMUNITY): Payer: Self-pay | Admitting: Cardiac Rehabilitation

## 2014-10-20 NOTE — Telephone Encounter (Signed)
pc to [facility] [employer] to verify authorization for cardiac rehab at [facility].  Per [employer], authorization # [id number] for cardiac rehabilitation 36 visits from 10/16/2014-12/14/2014.  A request for additional services to extend date to allow completion of 36 sessions will need to be submitted via form retrieved from [employer].  [employer] representative has submitted request for provider packet to be faxed to [phone] to be received within 5 business days.

## 2014-11-05 ENCOUNTER — Encounter (HOSPITAL_COMMUNITY)
Admission: RE | Admit: 2014-11-05 | Discharge: 2014-11-05 | Disposition: A | Discharge status: Home / Self Care | Payer: Non-veteran care | Source: Ambulatory Visit | Attending: Cardiology | Admitting: Cardiology

## 2014-11-05 DIAGNOSIS — Z48812 Encounter for surgical aftercare following surgery on the circulatory system: Secondary | ICD-10-CM | POA: Insufficient documentation

## 2014-11-05 DIAGNOSIS — Z955 Presence of coronary angioplasty implant and graft: Secondary | ICD-10-CM | POA: Insufficient documentation

## 2014-11-05 NOTE — Progress Notes (Signed)
Cardiac Rehab Medication Review by a Pharmacist  Does the patient  feel that his/her medications are working for him/her?  yes  Has the patient been experiencing any side effects to the medications prescribed?  Yes - metformin (diarrhea)  Does the patient measure his/her own blood pressure or blood glucose at home?  yes - both  Does the patient have any problems obtaining medications due to transportation or finances?   no  Understanding of regimen: good Understanding of indications: good Potential of compliance: excellent    Pharmacist comments: Pt and wife has good understanding of meds and indications. No issues reported. Excellent compliance.  Elicia Lamp, PharmD Clinical Pharmacist - Resident Pager [phone] 11/05/2014 8:28 AM     Elicia Lamp P 11/05/2014 8:24 AM

## 2014-11-09 ENCOUNTER — Encounter (HOSPITAL_COMMUNITY)
Admission: RE | Admit: 2014-11-09 | Discharge: 2014-11-09 | Disposition: A | Discharge status: Home / Self Care | Payer: Non-veteran care | Source: Ambulatory Visit | Attending: Cardiology | Admitting: Cardiology

## 2014-11-09 ENCOUNTER — Encounter (HOSPITAL_COMMUNITY): Payer: Self-pay

## 2014-11-09 DIAGNOSIS — Z955 Presence of coronary angioplasty implant and graft: Secondary | ICD-10-CM | POA: Diagnosis not present

## 2014-11-09 DIAGNOSIS — Z48812 Encounter for surgical aftercare following surgery on the circulatory system: Secondary | ICD-10-CM | POA: Diagnosis not present

## 2014-11-09 LAB — GLUCOSE, CAPILLARY: GLUCOSE-CAPILLARY: 91 mg/dL (ref 70–99)

## 2014-11-09 NOTE — Progress Notes (Signed)
Pt started cardiac rehab today.  Pt tolerated light exercise, however due to generalized weakness and deconditioning very limited stamina, rest breaks were encouraged towards end of exercise session.   VSS, telemetry-sinus rhythm, first degree AV block, wide QRS, t wave inversion.   Asymptomatic.  PHQ-1, pt reports history of PTSD currently undergoing psychiatric care.  Stamina may be barrier to group rehab participation, pt reports he received motorized scooter for home use this week.  Will recommend seat activities until stamina improves.  Pt goals for cardiac rehab are to improve stamina and improved dyspnea on exertion. Pt reports currently he can only do 15-20 minutes of home activity before he needs to rest for 45-60 minutes before resuming.  Pt oriented to exercise equipment and routine.  Understanding verbalized.

## 2014-11-10 ENCOUNTER — Telehealth: Payer: Self-pay | Admitting: *Deleted

## 2014-11-10 NOTE — Telephone Encounter (Signed)
-----   Message from Sueanne Margarita, MD sent at 11/09/2014  3:56 PM EST ----- Elevated BS at Cardiac Rehab - please forward to primary MD

## 2014-11-11 ENCOUNTER — Encounter (HOSPITAL_COMMUNITY)
Admission: RE | Admit: 2014-11-11 | Discharge: 2014-11-11 | Disposition: A | Discharge status: Home / Self Care | Payer: Non-veteran care | Source: Ambulatory Visit | Attending: Cardiology | Admitting: Cardiology

## 2014-11-11 DIAGNOSIS — Z48812 Encounter for surgical aftercare following surgery on the circulatory system: Secondary | ICD-10-CM | POA: Diagnosis not present

## 2014-11-11 NOTE — Telephone Encounter (Signed)
Follow Up       Pt returning [name]'s phone call.

## 2014-11-12 LAB — GLUCOSE, CAPILLARY
GLUCOSE-CAPILLARY: 60 mg/dL — AB (ref 70–99)
GLUCOSE-CAPILLARY: 94 mg/dL (ref 70–99)
GLUCOSE-CAPILLARY: 116 mg/dL — AB (ref 70–99)
GLUCOSE-CAPILLARY: 91 mg/dL (ref 70–99)
GLUCOSE-CAPILLARY: 120 mg/dL — AB (ref 70–99)

## 2014-11-13 ENCOUNTER — Encounter (HOSPITAL_COMMUNITY)
Admission: RE | Admit: 2014-11-13 | Discharge: 2014-11-13 | Disposition: A | Discharge status: Home / Self Care | Payer: Non-veteran care | Source: Ambulatory Visit | Attending: Cardiology | Admitting: Cardiology

## 2014-11-13 DIAGNOSIS — Z48812 Encounter for surgical aftercare following surgery on the circulatory system: Secondary | ICD-10-CM | POA: Diagnosis not present

## 2014-11-13 LAB — GLUCOSE, CAPILLARY
Glucose-Capillary: 108 mg/dL — ABNORMAL HIGH (ref 70–99)
Glucose-Capillary: 97 mg/dL (ref 70–99)

## 2014-11-16 ENCOUNTER — Encounter (HOSPITAL_COMMUNITY)
Admission: RE | Admit: 2014-11-16 | Discharge: 2014-11-16 | Disposition: A | Discharge status: Home / Self Care | Payer: Non-veteran care | Source: Ambulatory Visit | Attending: Cardiology | Admitting: Cardiology

## 2014-11-16 DIAGNOSIS — Z48812 Encounter for surgical aftercare following surgery on the circulatory system: Secondary | ICD-10-CM | POA: Diagnosis not present

## 2014-11-16 LAB — GLUCOSE, CAPILLARY
GLUCOSE-CAPILLARY: 136 mg/dL — AB (ref 70–99)
Glucose-Capillary: 137 mg/dL — ABNORMAL HIGH (ref 70–99)

## 2014-11-18 ENCOUNTER — Encounter (HOSPITAL_COMMUNITY)
Admission: RE | Admit: 2014-11-18 | Discharge: 2014-11-18 | Disposition: A | Discharge status: Home / Self Care | Payer: Non-veteran care | Source: Ambulatory Visit | Attending: Cardiology | Admitting: Cardiology

## 2014-11-18 DIAGNOSIS — Z48812 Encounter for surgical aftercare following surgery on the circulatory system: Secondary | ICD-10-CM | POA: Diagnosis not present

## 2014-11-18 LAB — GLUCOSE, CAPILLARY
GLUCOSE-CAPILLARY: 86 mg/dL (ref 70–99)
Glucose-Capillary: 111 mg/dL — ABNORMAL HIGH (ref 70–99)
Glucose-Capillary: 120 mg/dL — ABNORMAL HIGH (ref 70–99)

## 2014-11-18 NOTE — Telephone Encounter (Signed)
Patient stated that his doctor is working with him as of today and they are adjusting medication for his BS. His PCP is Dr. Lynelle Smoke, and she is aware of results and she is treating him.

## 2014-11-20 ENCOUNTER — Encounter (HOSPITAL_COMMUNITY)
Admission: RE | Admit: 2014-11-20 | Discharge: 2014-11-20 | Disposition: A | Discharge status: Home / Self Care | Payer: Non-veteran care | Source: Ambulatory Visit | Attending: Cardiology | Admitting: Cardiology

## 2014-11-20 DIAGNOSIS — Z48812 Encounter for surgical aftercare following surgery on the circulatory system: Secondary | ICD-10-CM | POA: Diagnosis not present

## 2014-11-20 LAB — GLUCOSE, CAPILLARY
GLUCOSE-CAPILLARY: 87 mg/dL (ref 70–99)
Glucose-Capillary: 103 mg/dL — ABNORMAL HIGH (ref 70–99)
Glucose-Capillary: 109 mg/dL — ABNORMAL HIGH (ref 70–99)

## 2014-11-23 ENCOUNTER — Encounter (HOSPITAL_COMMUNITY)
Admission: RE | Admit: 2014-11-23 | Discharge: 2014-11-23 | Disposition: A | Discharge status: Home / Self Care | Payer: Non-veteran care | Source: Ambulatory Visit | Attending: Cardiology | Admitting: Cardiology

## 2014-11-23 DIAGNOSIS — Z48812 Encounter for surgical aftercare following surgery on the circulatory system: Secondary | ICD-10-CM | POA: Diagnosis not present

## 2014-11-23 LAB — GLUCOSE, CAPILLARY: Glucose-Capillary: 104 mg/dL — ABNORMAL HIGH (ref 70–99)

## 2014-11-24 LAB — GLUCOSE, CAPILLARY
GLUCOSE-CAPILLARY: 96 mg/dL (ref 70–99)
Glucose-Capillary: 85 mg/dL (ref 70–99)

## 2014-11-25 ENCOUNTER — Encounter (HOSPITAL_COMMUNITY)
Admission: RE | Admit: 2014-11-25 | Discharge: 2014-11-25 | Disposition: A | Discharge status: Home / Self Care | Payer: Non-veteran care | Source: Ambulatory Visit | Attending: Cardiology | Admitting: Cardiology

## 2014-11-25 DIAGNOSIS — Z48812 Encounter for surgical aftercare following surgery on the circulatory system: Secondary | ICD-10-CM | POA: Diagnosis not present

## 2014-11-25 LAB — GLUCOSE, CAPILLARY: GLUCOSE-CAPILLARY: 131 mg/dL — AB (ref 70–99)

## 2014-11-26 LAB — GLUCOSE, CAPILLARY: Glucose-Capillary: 126 mg/dL — ABNORMAL HIGH (ref 70–99)

## 2014-11-27 ENCOUNTER — Encounter (HOSPITAL_COMMUNITY)
Admission: RE | Admit: 2014-11-27 | Discharge: 2014-11-27 | Disposition: A | Discharge status: Home / Self Care | Payer: Non-veteran care | Source: Ambulatory Visit | Attending: Cardiology | Admitting: Cardiology

## 2014-11-27 DIAGNOSIS — Z48812 Encounter for surgical aftercare following surgery on the circulatory system: Secondary | ICD-10-CM | POA: Diagnosis not present

## 2014-11-30 ENCOUNTER — Encounter (HOSPITAL_COMMUNITY)
Admission: RE | Admit: 2014-11-30 | Discharge: 2014-11-30 | Disposition: A | Discharge status: Home / Self Care | Payer: Non-veteran care | Source: Ambulatory Visit | Attending: Cardiology | Admitting: Cardiology

## 2014-11-30 DIAGNOSIS — Z48812 Encounter for surgical aftercare following surgery on the circulatory system: Secondary | ICD-10-CM | POA: Diagnosis not present

## 2014-11-30 LAB — GLUCOSE, CAPILLARY
Glucose-Capillary: 115 mg/dL — ABNORMAL HIGH (ref 70–99)
Glucose-Capillary: 107 mg/dL — ABNORMAL HIGH (ref 70–99)
Glucose-Capillary: 50 mg/dL — ABNORMAL LOW (ref 70–99)
Glucose-Capillary: 96 mg/dL (ref 70–99)

## 2014-11-30 NOTE — Progress Notes (Addendum)
Pt  CBG-50 post exercise at  Cardiac rehab. Pt pre CBG-107.  Pt was given a banana since pt had only  eaten [place] sandwich 1 hour prior to arrival.  Pt feels weak and tired.  Pt given 1 tube of glucose gel, gingerale and graham crackers.  15 minute recheck CBG-96.  Pt asymptomatic.  Will continue to monitor.  Pt has appt with PCP at [facility] tomorrow and will discuss hypoglycemia episodes with her at that time. Pt wife confirmed pt is currently taking Novolin 8units BID which is recent decrease.  Will continue to monitor.

## 2014-12-02 ENCOUNTER — Encounter (HOSPITAL_COMMUNITY)
Admission: RE | Admit: 2014-12-02 | Discharge: 2014-12-02 | Disposition: A | Discharge status: Home / Self Care | Payer: Non-veteran care | Source: Ambulatory Visit | Attending: Cardiology | Admitting: Cardiology

## 2014-12-02 DIAGNOSIS — Z48812 Encounter for surgical aftercare following surgery on the circulatory system: Secondary | ICD-10-CM | POA: Diagnosis not present

## 2014-12-02 LAB — GLUCOSE, CAPILLARY: GLUCOSE-CAPILLARY: 90 mg/dL (ref 70–99)

## 2014-12-02 MED FILL — Glucose Gel 40%: ORAL | Qty: 1 | Status: AC

## 2014-12-02 NOTE — Progress Notes (Addendum)
Pt arrived at cardiac rehab reporting his Novolin R insulin has been discontinued by his PCP, Dr. Lynelle Smoke.  Pt did not take this today.  Medication list reconciled. Pre exercise CBG-109, post 90. Pt offered snack, however he refused. Pt has snack from home he will eat.  Pt weight up 1.7b kg over 2 days. Pt asymptomatic. Pt lungs clear, no edema, mild dyspnea on exertion.  O2 sat-98% ra.  Pt states he was advised by PCP to increase PO fluid intake based on recent lab results, therefore pt drank 24 oz more water yesterday than usual.  Per pt report he drinks approximately 36 oz daily, which pt was instructed is too low.  Pt advised appropriate to drink 48-64 oz daily not to exceed 2000 ml daily. Understanding verbalized.

## 2014-12-03 LAB — GLUCOSE, CAPILLARY: Glucose-Capillary: 109 mg/dL — ABNORMAL HIGH (ref 70–99)

## 2014-12-04 ENCOUNTER — Encounter (HOSPITAL_COMMUNITY)
Admission: RE | Admit: 2014-12-04 | Discharge: 2014-12-04 | Disposition: A | Discharge status: Home / Self Care | Payer: Non-veteran care | Source: Ambulatory Visit | Attending: Cardiology | Admitting: Cardiology

## 2014-12-04 DIAGNOSIS — Z48812 Encounter for surgical aftercare following surgery on the circulatory system: Secondary | ICD-10-CM | POA: Diagnosis not present

## 2014-12-04 DIAGNOSIS — Z955 Presence of coronary angioplasty implant and graft: Secondary | ICD-10-CM | POA: Insufficient documentation

## 2014-12-04 LAB — GLUCOSE, CAPILLARY: Glucose-Capillary: 114 mg/dL — ABNORMAL HIGH (ref 70–99)

## 2014-12-04 NOTE — Progress Notes (Signed)
PSYCHOSOCIAL ASSESSMENT  Pt psychosocial assessment reveals no barriers to rehab participation.  Pt quality of life is slightly altered by his physical constraints which limits his ability to perform tasks as prior to his illness.   Pt exhibits positive coping skills and has supportive family.   Pt wife is primary caregiver and prepares his medications and coordinates his appointments.   Pt mobility is limited however pt has recently received a motorized scooter from [place] which has increased his ability to go places outside of his home which has brightens his outlook.  Pt also notes increased strength and stamina since beginning cardiac rehab program. Pt able to walk track today without walker.  Pt expresses concern that he has not had successful weight lose efforts.   Offered emotional support, reassurance and encouragement to continue with low fat low cholesterol diet.   Will continue to monitor.

## 2014-12-07 ENCOUNTER — Encounter (HOSPITAL_COMMUNITY)
Admission: RE | Admit: 2014-12-07 | Discharge: 2014-12-07 | Disposition: A | Discharge status: Home / Self Care | Payer: Non-veteran care | Source: Ambulatory Visit | Attending: Cardiology | Admitting: Cardiology

## 2014-12-07 DIAGNOSIS — Z48812 Encounter for surgical aftercare following surgery on the circulatory system: Secondary | ICD-10-CM | POA: Diagnosis not present

## 2014-12-07 LAB — GLUCOSE, CAPILLARY: GLUCOSE-CAPILLARY: 145 mg/dL — AB (ref 70–99)

## 2014-12-07 NOTE — Progress Notes (Signed)
Reviewed home exercise guidelines with patient including endpoints, temperature precautions, target heart rate and rate of perceived exertion. Pt plans to walk as his mode of home exercise. Pt plans to walk 10 minutes 3x times each day outside, as weather permits 1 to 2 days/week. Pt plans to join silver sneakers with his wife.  Pt voices understanding of instructions given.  Shirlyn Goltz Academic Intern Sol Passer, MS, ACSM CCEP

## 2014-12-08 LAB — GLUCOSE, CAPILLARY
GLUCOSE-CAPILLARY: 91 mg/dL (ref 70–99)
Glucose-Capillary: 141 mg/dL — ABNORMAL HIGH (ref 70–99)

## 2014-12-09 ENCOUNTER — Encounter (HOSPITAL_COMMUNITY)
Admission: RE | Admit: 2014-12-09 | Discharge: 2014-12-09 | Disposition: A | Discharge status: Home / Self Care | Payer: Non-veteran care | Source: Ambulatory Visit | Attending: Cardiology | Admitting: Cardiology

## 2014-12-09 DIAGNOSIS — Z48812 Encounter for surgical aftercare following surgery on the circulatory system: Secondary | ICD-10-CM | POA: Diagnosis not present

## 2014-12-09 LAB — GLUCOSE, CAPILLARY
GLUCOSE-CAPILLARY: 141 mg/dL — AB (ref 70–99)
Glucose-Capillary: 67 mg/dL — ABNORMAL LOW (ref 70–99)
Glucose-Capillary: 93 mg/dL (ref 70–99)
Glucose-Capillary: 105 mg/dL — ABNORMAL HIGH (ref 70–99)

## 2014-12-09 NOTE — Progress Notes (Signed)
Pt arrived cardiac rehab CBG-67.  Pt asymptomatic.  Pt given glucose gel and gingerale.  Recheck CBG-93.  Pt given peanut butter crackers and banana.  Pt would like to exercise at 2:45pm class today. Pt reports he ate macaroni and chicken salad for breakfast and lunch today.  Pt no longer taking Novolin R as DC by his PCP DR. Lynelle Smoke last week in attempt to correct frequent hypoglycemia.  Will continue to monitor.

## 2014-12-11 ENCOUNTER — Encounter (HOSPITAL_COMMUNITY)
Admission: RE | Admit: 2014-12-11 | Discharge: 2014-12-11 | Disposition: A | Discharge status: Home / Self Care | Payer: Non-veteran care | Source: Ambulatory Visit | Attending: Cardiology | Admitting: Cardiology

## 2014-12-11 DIAGNOSIS — Z48812 Encounter for surgical aftercare following surgery on the circulatory system: Secondary | ICD-10-CM | POA: Diagnosis not present

## 2014-12-11 LAB — GLUCOSE, CAPILLARY
GLUCOSE-CAPILLARY: 109 mg/dL — AB (ref 70–99)
Glucose-Capillary: 105 mg/dL — ABNORMAL HIGH (ref 70–99)
Glucose-Capillary: 116 mg/dL — ABNORMAL HIGH (ref 70–99)

## 2014-12-14 ENCOUNTER — Encounter (HOSPITAL_COMMUNITY)
Admission: RE | Admit: 2014-12-14 | Discharge: 2014-12-14 | Disposition: A | Discharge status: Home / Self Care | Payer: Non-veteran care | Source: Ambulatory Visit | Attending: Cardiology | Admitting: Cardiology

## 2014-12-14 DIAGNOSIS — Z48812 Encounter for surgical aftercare following surgery on the circulatory system: Secondary | ICD-10-CM | POA: Diagnosis not present

## 2014-12-14 LAB — GLUCOSE, CAPILLARY
GLUCOSE-CAPILLARY: 108 mg/dL — AB (ref 70–99)
Glucose-Capillary: 124 mg/dL — ABNORMAL HIGH (ref 70–99)

## 2014-12-14 NOTE — Progress Notes (Signed)
Vernon Briggs 69 y.o. male Nutrition Note Spoke with pt. Nutrition Plan and Nutrition Survey goals reviewed with pt. Pt is following Step 1 of the Therapeutic Lifestyle Changes diet. Pt wants to lose wt. Pt is "frustrated because I haven't lost any wt." Wt loss tips reviewed. Pt encouraged to look at the positive changes that have occurred as a result of participating in Cardiac Rehab (e.g. Novolin R discontinued, better blood glucose control). Pt is diabetic. No recent A1c noted. Pt states he checks his CBG's BID and fasting CBG's typically run around "140's-160's." This Probation officer went over Diabetes Education test results. Pt is on Coumadin. Consistent Vitamin K intake briefly discussed. Pt expressed understanding of the information reviewed. Pt aware of nutrition education classes offered and is unable to attend nutrition classes. Lab Results  Component Value Date   HGBA1C 5.9* 10/21/2012   Nutrition Diagnosis ? Food-and nutrition-related knowledge deficit related to lack of exposure to information as related to diagnosis of: ? CVD ? DM  ? Obesity related to excessive energy intake as evidenced by a BMI of 36.6  Nutrition RX/ Estimated Daily Nutrition Needs for: wt loss  2000-2500 Kcal, 55-65 gm fat, 13-17 gm sat fat, 2.0-2.5 gm trans-fat, <1500 mg sodium, 250-325 gm CHO   Nutrition Intervention ? Pt's individual nutrition plan reviewed with pt. ? Benefits of adopting Therapeutic Lifestyle Changes discussed when Medficts reviewed. ? Pt to attend the Portion Distortion class ? Pt to attend the Diabetes Q & A class   ? Pt given handouts for: ? Nutrition I class ? Nutrition II class ? Diabetes Blitz Class ? Continue client-centered nutrition education by RD, as part of interdisciplinary care. Goal(s) ? Pt to identify and limit food sources of saturated fat, trans fat, and cholesterol ? Pt to identify food quantities necessary to achieve: ? wt loss to a goal wt of 242-260 lb (109.9-118.1 kg) at  graduation from cardiac rehab.  ? CBG concentrations in the normal range or as close to normal as is safely possible. Monitor and Evaluate progress toward nutrition goal with team. Nutrition Risk: Change to Moderate [name], M.Ed, RD, LDN, CDE 12/14/2014 2:25 PM

## 2014-12-16 ENCOUNTER — Encounter (HOSPITAL_COMMUNITY)
Admission: RE | Admit: 2014-12-16 | Discharge: 2014-12-16 | Disposition: A | Discharge status: Home / Self Care | Payer: Non-veteran care | Source: Ambulatory Visit | Attending: Cardiology | Admitting: Cardiology

## 2014-12-16 DIAGNOSIS — Z48812 Encounter for surgical aftercare following surgery on the circulatory system: Secondary | ICD-10-CM | POA: Diagnosis not present

## 2014-12-16 LAB — GLUCOSE, CAPILLARY
GLUCOSE-CAPILLARY: 119 mg/dL — AB (ref 70–99)
Glucose-Capillary: 157 mg/dL — ABNORMAL HIGH (ref 70–99)

## 2014-12-16 NOTE — Progress Notes (Signed)
Pt arrived at [place] rehab today reporting medication change per Dr. Lynelle Smoke at [facility]. Pt will hold glipzide 5mg  am dose on exercise days and decrease metformin to 250mg  BID. Medication list reconciled.  CBG stable upon arrival and throughout exercise at cardiac rehab today.

## 2014-12-18 ENCOUNTER — Encounter (HOSPITAL_COMMUNITY)
Admission: RE | Admit: 2014-12-18 | Discharge: 2014-12-18 | Disposition: A | Discharge status: Home / Self Care | Payer: Non-veteran care | Source: Ambulatory Visit | Attending: Cardiology | Admitting: Cardiology

## 2014-12-18 DIAGNOSIS — Z48812 Encounter for surgical aftercare following surgery on the circulatory system: Secondary | ICD-10-CM | POA: Diagnosis not present

## 2014-12-18 LAB — GLUCOSE, CAPILLARY
Glucose-Capillary: 100 mg/dL — ABNORMAL HIGH (ref 70–99)
Glucose-Capillary: 140 mg/dL — ABNORMAL HIGH (ref 70–99)

## 2014-12-21 ENCOUNTER — Encounter (HOSPITAL_COMMUNITY): Payer: Non-veteran care

## 2014-12-23 ENCOUNTER — Encounter (HOSPITAL_COMMUNITY)
Admission: RE | Admit: 2014-12-23 | Discharge: 2014-12-23 | Disposition: A | Discharge status: Home / Self Care | Payer: Non-veteran care | Source: Ambulatory Visit | Attending: Cardiology | Admitting: Cardiology

## 2014-12-23 DIAGNOSIS — Z48812 Encounter for surgical aftercare following surgery on the circulatory system: Secondary | ICD-10-CM | POA: Diagnosis not present

## 2014-12-23 LAB — GLUCOSE, CAPILLARY
GLUCOSE-CAPILLARY: 97 mg/dL (ref 70–99)
Glucose-Capillary: 112 mg/dL — ABNORMAL HIGH (ref 70–99)

## 2014-12-25 ENCOUNTER — Encounter (HOSPITAL_COMMUNITY)
Admission: RE | Admit: 2014-12-25 | Discharge: 2014-12-25 | Disposition: A | Discharge status: Home / Self Care | Payer: Non-veteran care | Source: Ambulatory Visit | Attending: Cardiology | Admitting: Cardiology

## 2014-12-25 DIAGNOSIS — Z48812 Encounter for surgical aftercare following surgery on the circulatory system: Secondary | ICD-10-CM | POA: Diagnosis not present

## 2014-12-25 LAB — GLUCOSE, CAPILLARY: Glucose-Capillary: 217 mg/dL — ABNORMAL HIGH (ref 70–99)

## 2014-12-28 ENCOUNTER — Encounter (HOSPITAL_COMMUNITY)
Admission: RE | Admit: 2014-12-28 | Discharge: 2014-12-28 | Disposition: A | Discharge status: Home / Self Care | Payer: Non-veteran care | Source: Ambulatory Visit | Attending: Cardiology | Admitting: Cardiology

## 2014-12-28 DIAGNOSIS — Z48812 Encounter for surgical aftercare following surgery on the circulatory system: Secondary | ICD-10-CM | POA: Diagnosis not present

## 2014-12-28 LAB — GLUCOSE, CAPILLARY: Glucose-Capillary: 162 mg/dL — ABNORMAL HIGH (ref 70–99)

## 2014-12-30 ENCOUNTER — Encounter (HOSPITAL_COMMUNITY)
Admission: RE | Admit: 2014-12-30 | Discharge: 2014-12-30 | Disposition: A | Discharge status: Home / Self Care | Payer: Non-veteran care | Source: Ambulatory Visit | Attending: Cardiology | Admitting: Cardiology

## 2014-12-30 DIAGNOSIS — Z48812 Encounter for surgical aftercare following surgery on the circulatory system: Secondary | ICD-10-CM | POA: Diagnosis not present

## 2014-12-30 LAB — GLUCOSE, CAPILLARY: Glucose-Capillary: 153 mg/dL — ABNORMAL HIGH (ref 70–99)

## 2015-01-01 ENCOUNTER — Encounter (HOSPITAL_COMMUNITY)
Admission: RE | Admit: 2015-01-01 | Discharge: 2015-01-01 | Disposition: A | Discharge status: Home / Self Care | Payer: Non-veteran care | Source: Ambulatory Visit | Attending: Cardiology | Admitting: Cardiology

## 2015-01-01 DIAGNOSIS — Z48812 Encounter for surgical aftercare following surgery on the circulatory system: Secondary | ICD-10-CM | POA: Diagnosis not present

## 2015-01-01 LAB — GLUCOSE, CAPILLARY: Glucose-Capillary: 172 mg/dL — ABNORMAL HIGH (ref 70–99)

## 2015-01-04 ENCOUNTER — Encounter (HOSPITAL_COMMUNITY)
Admission: RE | Admit: 2015-01-04 | Discharge: 2015-01-04 | Disposition: A | Discharge status: Home / Self Care | Payer: Non-veteran care | Source: Ambulatory Visit | Attending: Cardiology | Admitting: Cardiology

## 2015-01-04 DIAGNOSIS — Z48812 Encounter for surgical aftercare following surgery on the circulatory system: Secondary | ICD-10-CM | POA: Insufficient documentation

## 2015-01-04 DIAGNOSIS — Z955 Presence of coronary angioplasty implant and graft: Secondary | ICD-10-CM | POA: Insufficient documentation

## 2015-01-05 LAB — GLUCOSE, CAPILLARY
GLUCOSE-CAPILLARY: 83 mg/dL (ref 70–99)
Glucose-Capillary: 111 mg/dL — ABNORMAL HIGH (ref 70–99)

## 2015-01-06 ENCOUNTER — Encounter (HOSPITAL_COMMUNITY): Payer: Non-veteran care

## 2015-01-08 ENCOUNTER — Encounter (HOSPITAL_COMMUNITY)
Admission: RE | Admit: 2015-01-08 | Discharge: 2015-01-08 | Disposition: A | Discharge status: Home / Self Care | Payer: Non-veteran care | Source: Ambulatory Visit | Attending: Cardiology | Admitting: Cardiology

## 2015-01-08 DIAGNOSIS — Z48812 Encounter for surgical aftercare following surgery on the circulatory system: Secondary | ICD-10-CM | POA: Diagnosis not present

## 2015-01-08 LAB — GLUCOSE, CAPILLARY: Glucose-Capillary: 187 mg/dL — ABNORMAL HIGH (ref 70–99)

## 2015-01-08 NOTE — Progress Notes (Signed)
Pt arrived at cardiac rehab reporting medication changes per his cardiologist at [facility].  Pt has DC Lasix and started torsemide 20mg  2 tabs daily.  Medication list reconciled.

## 2015-01-11 ENCOUNTER — Encounter (HOSPITAL_COMMUNITY)
Admission: RE | Admit: 2015-01-11 | Discharge: 2015-01-11 | Disposition: A | Discharge status: Home / Self Care | Payer: Non-veteran care | Source: Ambulatory Visit | Attending: Cardiology | Admitting: Cardiology

## 2015-01-11 DIAGNOSIS — Z48812 Encounter for surgical aftercare following surgery on the circulatory system: Secondary | ICD-10-CM | POA: Diagnosis not present

## 2015-01-11 LAB — GLUCOSE, CAPILLARY: Glucose-Capillary: 114 mg/dL — ABNORMAL HIGH (ref 70–99)

## 2015-01-13 ENCOUNTER — Encounter (HOSPITAL_COMMUNITY)
Admission: RE | Admit: 2015-01-13 | Discharge: 2015-01-13 | Disposition: A | Discharge status: Home / Self Care | Payer: Non-veteran care | Source: Ambulatory Visit | Attending: Cardiology | Admitting: Cardiology

## 2015-01-13 DIAGNOSIS — Z48812 Encounter for surgical aftercare following surgery on the circulatory system: Secondary | ICD-10-CM | POA: Diagnosis not present

## 2015-01-13 LAB — GLUCOSE, CAPILLARY: Glucose-Capillary: 179 mg/dL — ABNORMAL HIGH (ref 70–99)

## 2015-01-15 ENCOUNTER — Encounter (HOSPITAL_COMMUNITY)
Admission: RE | Admit: 2015-01-15 | Discharge: 2015-01-15 | Disposition: A | Discharge status: Home / Self Care | Payer: Non-veteran care | Source: Ambulatory Visit | Attending: Cardiology | Admitting: Cardiology

## 2015-01-15 DIAGNOSIS — Z48812 Encounter for surgical aftercare following surgery on the circulatory system: Secondary | ICD-10-CM | POA: Diagnosis not present

## 2015-01-15 NOTE — Progress Notes (Signed)
Pt c/o dizziness post exercise at cardiac rehab.  CBG-91. BP:  105/49 sitting, 87/48 standing.  Pt given lemonade.  Recheck BP:  106/49, 91/50.  Pt given gatorade.  Recheck BP:  112/63 sitting, 105/65 standing.  Pt admits to poor PO fluid intake. In addition, pt weight down 3kg today since 01/13/15.  Pt does report he was wearing heavier clothing on last visit and this finding does not correlate with his home scales.  PC to [facility] Cardiology to discuss. Unfortunately, [place] phone system in operable unable to transfer call to cardiology department.  Pt reports he has upcoming appt with cardiology on Tuesday 01/21/15.  Pt instructed no change in current regimen, except increase PO fluid intake, however not to consume more than 2043ml daily. Understanding verbalized.

## 2015-01-18 ENCOUNTER — Encounter (HOSPITAL_COMMUNITY)
Admission: RE | Admit: 2015-01-18 | Discharge: 2015-01-18 | Disposition: A | Discharge status: Home / Self Care | Payer: Non-veteran care | Source: Ambulatory Visit | Attending: Cardiology | Admitting: Cardiology

## 2015-01-18 DIAGNOSIS — Z48812 Encounter for surgical aftercare following surgery on the circulatory system: Secondary | ICD-10-CM | POA: Diagnosis not present

## 2015-01-18 LAB — GLUCOSE, CAPILLARY
GLUCOSE-CAPILLARY: 146 mg/dL — AB (ref 65–99)
GLUCOSE-CAPILLARY: 87 mg/dL (ref 65–99)
Glucose-Capillary: 91 mg/dL (ref 65–99)
Glucose-Capillary: 157 mg/dL — ABNORMAL HIGH (ref 65–99)
Glucose-Capillary: 79 mg/dL (ref 65–99)

## 2015-01-18 NOTE — Progress Notes (Signed)
Pt c/o dizziness post exercise at cardiac rehab.  CBG:  87. Pt given lemonade.  Post BP:  90/57.   Pt CBG recheck:  78.  Pt given gatorade and banana.  Recheck CBG:  157, BP:  137/59.  Pt asymptomatic.  Pt only ate 1/2 sandwich prior to exercise.  Pt instructed to add carbohydrate to his lunch prior to exercise.  Understanding verbalized.

## 2015-01-20 ENCOUNTER — Encounter (HOSPITAL_COMMUNITY): Payer: Non-veteran care

## 2015-01-22 ENCOUNTER — Encounter (HOSPITAL_COMMUNITY)
Admission: RE | Admit: 2015-01-22 | Discharge: 2015-01-22 | Disposition: A | Discharge status: Home / Self Care | Payer: Non-veteran care | Source: Ambulatory Visit | Attending: Cardiology | Admitting: Cardiology

## 2015-01-22 DIAGNOSIS — Z48812 Encounter for surgical aftercare following surgery on the circulatory system: Secondary | ICD-10-CM | POA: Diagnosis not present

## 2015-01-22 LAB — GLUCOSE, CAPILLARY: Glucose-Capillary: 110 mg/dL — ABNORMAL HIGH (ref 65–99)

## 2015-01-22 NOTE — Progress Notes (Signed)
Pt arrived at cardiac rehab reporting change in his torsemide dosage.  He is taking torsemide 40mg  BID for 3 days then torsemide 40mg  daily starting tomorrow.  Medication list reconciled.

## 2015-01-25 ENCOUNTER — Encounter (HOSPITAL_COMMUNITY)
Admission: RE | Admit: 2015-01-25 | Discharge: 2015-01-25 | Disposition: A | Discharge status: Home / Self Care | Payer: Non-veteran care | Source: Ambulatory Visit | Attending: Cardiology | Admitting: Cardiology

## 2015-01-25 DIAGNOSIS — Z48812 Encounter for surgical aftercare following surgery on the circulatory system: Secondary | ICD-10-CM | POA: Diagnosis not present

## 2015-01-25 LAB — GLUCOSE, CAPILLARY: Glucose-Capillary: 158 mg/dL — ABNORMAL HIGH (ref 65–99)

## 2015-01-27 ENCOUNTER — Encounter (HOSPITAL_COMMUNITY)
Admission: RE | Admit: 2015-01-27 | Discharge: 2015-01-27 | Disposition: A | Discharge status: Home / Self Care | Payer: Non-veteran care | Source: Ambulatory Visit | Attending: Cardiology | Admitting: Cardiology

## 2015-01-27 DIAGNOSIS — Z48812 Encounter for surgical aftercare following surgery on the circulatory system: Secondary | ICD-10-CM | POA: Diagnosis not present

## 2015-01-27 LAB — GLUCOSE, CAPILLARY
Glucose-Capillary: 142 mg/dL — ABNORMAL HIGH (ref 65–99)
Glucose-Capillary: 135 mg/dL — ABNORMAL HIGH (ref 65–99)

## 2015-01-29 ENCOUNTER — Encounter (HOSPITAL_COMMUNITY)
Admission: RE | Admit: 2015-01-29 | Discharge: 2015-01-29 | Disposition: A | Discharge status: Home / Self Care | Payer: Non-veteran care | Source: Ambulatory Visit | Attending: Cardiology | Admitting: Cardiology

## 2015-01-29 DIAGNOSIS — Z48812 Encounter for surgical aftercare following surgery on the circulatory system: Secondary | ICD-10-CM | POA: Diagnosis not present

## 2015-01-30 LAB — GLUCOSE, CAPILLARY: Glucose-Capillary: 181 mg/dL — ABNORMAL HIGH (ref 65–99)

## 2015-02-01 ENCOUNTER — Encounter (HOSPITAL_COMMUNITY): Payer: Non-veteran care

## 2015-02-03 ENCOUNTER — Encounter (HOSPITAL_COMMUNITY)
Admission: RE | Admit: 2015-02-03 | Discharge: 2015-02-03 | Disposition: A | Discharge status: Home / Self Care | Payer: Non-veteran care | Source: Ambulatory Visit | Attending: Cardiology | Admitting: Cardiology

## 2015-02-03 DIAGNOSIS — Z48812 Encounter for surgical aftercare following surgery on the circulatory system: Secondary | ICD-10-CM | POA: Diagnosis present

## 2015-02-03 DIAGNOSIS — Z955 Presence of coronary angioplasty implant and graft: Secondary | ICD-10-CM | POA: Diagnosis not present

## 2015-02-03 LAB — GLUCOSE, CAPILLARY: Glucose-Capillary: 108 mg/dL — ABNORMAL HIGH (ref 65–99)

## 2015-02-03 NOTE — Progress Notes (Addendum)
Pt c/o fatigue with exertion and dizziness post exercise at cardiac rehab.  CBG-108.  BP:  94/64 sitting, 78/50 standing. Pt given gatorade.  pc to [facility] cardiology clinic.  Report given to [place]. She will request [place] cardiologist call pt [phone].  Pt asymptomatic.  Recheck:  111/56 sitting, 96/51 standing.  Addendum:  Per pt wife, [place] cardiologist returned call and advised pt to decrease Norvasc to 1/2 tab.  Pt will begin dose adjustment tomorrow.

## 2015-02-05 ENCOUNTER — Encounter (HOSPITAL_COMMUNITY)
Admission: RE | Admit: 2015-02-05 | Discharge: 2015-02-05 | Disposition: A | Discharge status: Home / Self Care | Payer: Non-veteran care | Source: Ambulatory Visit | Attending: Cardiology | Admitting: Cardiology

## 2015-02-05 DIAGNOSIS — Z48812 Encounter for surgical aftercare following surgery on the circulatory system: Secondary | ICD-10-CM | POA: Diagnosis not present

## 2015-02-05 LAB — GLUCOSE, CAPILLARY: Glucose-Capillary: 109 mg/dL — ABNORMAL HIGH (ref 65–99)

## 2015-02-08 ENCOUNTER — Encounter (HOSPITAL_COMMUNITY)
Admission: RE | Admit: 2015-02-08 | Discharge: 2015-02-08 | Disposition: A | Discharge status: Home / Self Care | Payer: Non-veteran care | Source: Ambulatory Visit | Attending: Cardiology | Admitting: Cardiology

## 2015-02-08 DIAGNOSIS — Z48812 Encounter for surgical aftercare following surgery on the circulatory system: Secondary | ICD-10-CM | POA: Diagnosis not present

## 2015-02-08 LAB — GLUCOSE, CAPILLARY: Glucose-Capillary: 214 mg/dL — ABNORMAL HIGH (ref 65–99)

## 2015-02-10 ENCOUNTER — Encounter (HOSPITAL_COMMUNITY)
Admission: RE | Admit: 2015-02-10 | Discharge: 2015-02-10 | Disposition: A | Discharge status: Home / Self Care | Payer: Non-veteran care | Source: Ambulatory Visit | Attending: Cardiology | Admitting: Cardiology

## 2015-02-10 ENCOUNTER — Encounter (HOSPITAL_COMMUNITY): Payer: Self-pay

## 2015-02-10 DIAGNOSIS — Z48812 Encounter for surgical aftercare following surgery on the circulatory system: Secondary | ICD-10-CM | POA: Diagnosis not present

## 2015-02-10 LAB — GLUCOSE, CAPILLARY: GLUCOSE-CAPILLARY: 206 mg/dL — AB (ref 65–99)

## 2015-02-10 NOTE — Progress Notes (Addendum)
Pt graduated from cardiac rehab program today with completion of 36 exercise sessions in Phase II. Pt maintained good attendance and progressed nicely during his participation in rehab as evidenced by increased MET level and ability to walk longer distances.    Pt significantly increased his strength and stamina with rehab participation.  Medication list reconciled. Repeat  PHQ score-0.  Pt reports that he feels the "happiest I have ever been".  Pt PTSD is managed by psychiatrist at [facility].    Pt has made significant lifestyle changes and should be commended for his success. Pt feels he has achieved his goals during cardiac rehab.   Pt plans to continue exercising on his own at [employer] in [place].

## 2015-02-12 ENCOUNTER — Encounter (HOSPITAL_COMMUNITY): Payer: Non-veteran care

## 2015-04-02 ENCOUNTER — Encounter (HOSPITAL_COMMUNITY): Payer: Self-pay | Admitting: Emergency Medicine

## 2015-04-02 ENCOUNTER — Inpatient Hospital Stay (HOSPITAL_COMMUNITY)
Admission: EM | Admit: 2015-04-02 | Discharge: 2015-04-05 | DRG: 683 | Disposition: A | Discharge status: Home Health | Payer: Medicare Other | Attending: Internal Medicine | Admitting: Internal Medicine

## 2015-04-02 ENCOUNTER — Emergency Department (HOSPITAL_COMMUNITY): Payer: Medicare Other

## 2015-04-02 DIAGNOSIS — Z9114 Patient's other noncompliance with medication regimen: Secondary | ICD-10-CM | POA: Diagnosis present

## 2015-04-02 DIAGNOSIS — Z66 Do not resuscitate: Secondary | ICD-10-CM | POA: Diagnosis present

## 2015-04-02 DIAGNOSIS — Z8673 Personal history of transient ischemic attack (TIA), and cerebral infarction without residual deficits: Secondary | ICD-10-CM | POA: Diagnosis not present

## 2015-04-02 DIAGNOSIS — I5042 Chronic combined systolic (congestive) and diastolic (congestive) heart failure: Secondary | ICD-10-CM | POA: Diagnosis present

## 2015-04-02 DIAGNOSIS — E11649 Type 2 diabetes mellitus with hypoglycemia without coma: Secondary | ICD-10-CM | POA: Diagnosis present

## 2015-04-02 DIAGNOSIS — I2510 Atherosclerotic heart disease of native coronary artery without angina pectoris: Secondary | ICD-10-CM | POA: Diagnosis present

## 2015-04-02 DIAGNOSIS — Z955 Presence of coronary angioplasty implant and graft: Secondary | ICD-10-CM | POA: Diagnosis not present

## 2015-04-02 DIAGNOSIS — Z9861 Coronary angioplasty status: Secondary | ICD-10-CM

## 2015-04-02 DIAGNOSIS — Z7982 Long term (current) use of aspirin: Secondary | ICD-10-CM

## 2015-04-02 DIAGNOSIS — D696 Thrombocytopenia, unspecified: Secondary | ICD-10-CM | POA: Diagnosis present

## 2015-04-02 DIAGNOSIS — E860 Dehydration: Secondary | ICD-10-CM | POA: Diagnosis present

## 2015-04-02 DIAGNOSIS — N179 Acute kidney failure, unspecified: Secondary | ICD-10-CM | POA: Diagnosis present

## 2015-04-02 DIAGNOSIS — Z794 Long term (current) use of insulin: Secondary | ICD-10-CM

## 2015-04-02 DIAGNOSIS — I252 Old myocardial infarction: Secondary | ICD-10-CM | POA: Diagnosis not present

## 2015-04-02 DIAGNOSIS — I509 Heart failure, unspecified: Secondary | ICD-10-CM | POA: Diagnosis present

## 2015-04-02 DIAGNOSIS — Z95810 Presence of automatic (implantable) cardiac defibrillator: Secondary | ICD-10-CM | POA: Diagnosis present

## 2015-04-02 DIAGNOSIS — I10 Essential (primary) hypertension: Secondary | ICD-10-CM | POA: Diagnosis present

## 2015-04-02 DIAGNOSIS — E785 Hyperlipidemia, unspecified: Secondary | ICD-10-CM | POA: Diagnosis present

## 2015-04-02 DIAGNOSIS — Z7902 Long term (current) use of antithrombotics/antiplatelets: Secondary | ICD-10-CM | POA: Diagnosis not present

## 2015-04-02 DIAGNOSIS — Z951 Presence of aortocoronary bypass graft: Secondary | ICD-10-CM

## 2015-04-02 DIAGNOSIS — R531 Weakness: Secondary | ICD-10-CM

## 2015-04-02 DIAGNOSIS — R079 Chest pain, unspecified: Secondary | ICD-10-CM | POA: Diagnosis present

## 2015-04-02 DIAGNOSIS — Z7901 Long term (current) use of anticoagulants: Secondary | ICD-10-CM

## 2015-04-02 DIAGNOSIS — E118 Type 2 diabetes mellitus with unspecified complications: Secondary | ICD-10-CM | POA: Diagnosis present

## 2015-04-02 DIAGNOSIS — I480 Paroxysmal atrial fibrillation: Secondary | ICD-10-CM | POA: Diagnosis present

## 2015-04-02 HISTORY — DX: Type 2 diabetes mellitus without complications: E11.9

## 2015-04-02 HISTORY — DX: Obstructive sleep apnea (adult) (pediatric): G47.33

## 2015-04-02 HISTORY — DX: Paroxysmal atrial fibrillation: I48.0

## 2015-04-02 HISTORY — DX: Post-traumatic stress disorder, unspecified: F43.10

## 2015-04-02 HISTORY — DX: Dependence on other enabling machines and devices: Z99.89

## 2015-04-02 HISTORY — DX: Personal history of other diseases of the digestive system: Z87.19

## 2015-04-02 HISTORY — DX: Unspecified osteoarthritis, unspecified site: M19.90

## 2015-04-02 HISTORY — DX: Gastro-esophageal reflux disease without esophagitis: K21.9

## 2015-04-02 HISTORY — DX: Pure hypercholesterolemia, unspecified: E78.00

## 2015-04-02 HISTORY — DX: Presence of automatic (implantable) cardiac defibrillator: Z95.810

## 2015-04-02 HISTORY — DX: Anxiety disorder, unspecified: F41.9

## 2015-04-02 HISTORY — DX: Depression, unspecified: F32.A

## 2015-04-02 HISTORY — DX: Major depressive disorder, single episode, unspecified: F32.9

## 2015-04-02 LAB — URINALYSIS, ROUTINE W REFLEX MICROSCOPIC
Bilirubin Urine: NEGATIVE
Glucose, UA: NEGATIVE mg/dL
Hgb urine dipstick: NEGATIVE
Ketones, ur: NEGATIVE mg/dL
Leukocytes, UA: NEGATIVE
Nitrite: NEGATIVE
PH: 7 (ref 5.0–8.0)
Protein, ur: NEGATIVE mg/dL
Specific Gravity, Urine: 1.01 (ref 1.005–1.030)
UROBILINOGEN UA: 0.2 mg/dL (ref 0.0–1.0)

## 2015-04-02 LAB — BASIC METABOLIC PANEL
Anion gap: 10 (ref 5–15)
BUN: 33 mg/dL — ABNORMAL HIGH (ref 6–20)
CALCIUM: 9.2 mg/dL (ref 8.9–10.3)
CHLORIDE: 103 mmol/L (ref 101–111)
CO2: 26 mmol/L (ref 22–32)
Creatinine, Ser: 1.74 mg/dL — ABNORMAL HIGH (ref 0.61–1.24)
GFR calc Af Amer: 44 mL/min — ABNORMAL LOW (ref >60)
GFR calc non Af Amer: 38 mL/min — ABNORMAL LOW (ref >60)
GLUCOSE: 180 mg/dL — AB (ref 65–99)
Potassium: 5.3 mmol/L — ABNORMAL HIGH (ref 3.5–5.1)
Sodium: 139 mmol/L (ref 135–145)

## 2015-04-02 LAB — CBC
HCT: 39.1 % (ref 39.0–52.0)
Hemoglobin: 14 g/dL (ref 13.0–17.0)
MCH: 33.3 pg (ref 26.0–34.0)
MCHC: 35.8 g/dL (ref 30.0–36.0)
MCV: 92.9 fL (ref 78.0–100.0)
Platelets: 95 10*3/uL — ABNORMAL LOW (ref 150–400)
RBC: 4.21 MIL/uL — AB (ref 4.22–5.81)
RDW: 13.5 % (ref 11.5–15.5)
WBC: 6.8 10*3/uL (ref 4.0–10.5)

## 2015-04-02 LAB — PROTIME-INR
INR: 3.46 — AB (ref 0.00–1.49)
Prothrombin Time: 34.1 seconds — ABNORMAL HIGH (ref 11.6–15.2)

## 2015-04-02 LAB — I-STAT TROPONIN, ED: Troponin i, poc: 0 ng/mL (ref 0.00–0.08)

## 2015-04-02 LAB — HEPATIC FUNCTION PANEL
ALT: 32 U/L (ref 17–63)
AST: 31 U/L (ref 15–41)
Albumin: 3.5 g/dL (ref 3.5–5.0)
Alkaline Phosphatase: 57 U/L (ref 38–126)
Bilirubin, Direct: 0.1 mg/dL (ref 0.1–0.5)
Indirect Bilirubin: 0.6 mg/dL (ref 0.3–0.9)
Total Bilirubin: 0.7 mg/dL (ref 0.3–1.2)
Total Protein: 5.9 g/dL — ABNORMAL LOW (ref 6.5–8.1)

## 2015-04-02 LAB — BRAIN NATRIURETIC PEPTIDE: B Natriuretic Peptide: 156.3 pg/mL — ABNORMAL HIGH (ref 0.0–100.0)

## 2015-04-02 LAB — CBG MONITORING, ED: GLUCOSE-CAPILLARY: 172 mg/dL — AB (ref 65–99)

## 2015-04-02 LAB — TSH: TSH: 1.774 u[IU]/mL (ref 0.350–4.500)

## 2015-04-02 LAB — GLUCOSE, CAPILLARY: Glucose-Capillary: 187 mg/dL — ABNORMAL HIGH (ref 65–99)

## 2015-04-02 LAB — TROPONIN I: TROPONIN I: 0.03 ng/mL (ref <0.031)

## 2015-04-02 MED ORDER — SODIUM CHLORIDE 0.9 % IJ SOLN: 3.0000 mL | INTRAMUSCULAR | Status: DC | PRN

## 2015-04-02 MED ORDER — FLUOXETINE HCL 20 MG PO CAPS
40.0000 mg | ORAL_CAPSULE | Freq: Every day | ORAL | Status: DC
  Administered 2015-04-02 – 2015-04-04 (×3): 40 mg via ORAL
  Filled 2015-04-02 (×3): qty 2

## 2015-04-02 MED ORDER — INSULIN ASPART 100 UNIT/ML ~~LOC~~ SOLN
0.0000 [IU] | Freq: Three times a day (TID) | SUBCUTANEOUS | Status: DC
  Administered 2015-04-03: 3 [IU] via SUBCUTANEOUS
  Administered 2015-04-03: 2 [IU] via SUBCUTANEOUS
  Administered 2015-04-04 (×2): 3 [IU] via SUBCUTANEOUS
  Administered 2015-04-04: 2 [IU] via SUBCUTANEOUS
  Administered 2015-04-05: 3 [IU] via SUBCUTANEOUS

## 2015-04-02 MED ORDER — KETOCONAZOLE 2 % EX CREA
1.0000 "application " | TOPICAL_CREAM | Freq: Every day | CUTANEOUS | Status: DC | PRN
  Filled 2015-04-02: qty 15

## 2015-04-02 MED ORDER — LOPERAMIDE HCL 2 MG PO TABS: 1.0000 mg | ORAL_TABLET | Freq: Every day | ORAL | Status: DC | PRN

## 2015-04-02 MED ORDER — POLYVINYL ALCOHOL 1.4 % OP SOLN
1.0000 [drp] | Freq: Two times a day (BID) | OPHTHALMIC | Status: DC | PRN
  Filled 2015-04-02: qty 15

## 2015-04-02 MED ORDER — ACETAMINOPHEN 325 MG PO TABS: 650.0000 mg | ORAL_TABLET | Freq: Four times a day (QID) | ORAL | Status: DC | PRN

## 2015-04-02 MED ORDER — HYPROMELLOSE (GONIOSCOPIC) 2.5 % OP SOLN: 1.0000 [drp] | Freq: Two times a day (BID) | OPHTHALMIC | Status: DC | PRN

## 2015-04-02 MED ORDER — OMEGA-3-ACID ETHYL ESTERS 1 G PO CAPS
3.0000 g | ORAL_CAPSULE | Freq: Two times a day (BID) | ORAL | Status: DC
  Administered 2015-04-02 – 2015-04-05 (×6): 3 g via ORAL
  Filled 2015-04-02 (×6): qty 3

## 2015-04-02 MED ORDER — ACETAMINOPHEN 325 MG PO TABS: 650.0000 mg | ORAL_TABLET | ORAL | Status: DC | PRN

## 2015-04-02 MED ORDER — LOPERAMIDE HCL 2 MG PO CAPS
2.0000 mg | ORAL_CAPSULE | Freq: Every day | ORAL | Status: DC | PRN
  Administered 2015-04-05: 2 mg via ORAL
  Filled 2015-04-02: qty 1

## 2015-04-02 MED ORDER — ASPIRIN 81 MG PO CHEW
324.0000 mg | CHEWABLE_TABLET | Freq: Once | ORAL | Status: AC
  Administered 2015-04-02: 324 mg via ORAL
  Filled 2015-04-02: qty 4

## 2015-04-02 MED ORDER — MORPHINE SULFATE 2 MG/ML IJ SOLN: 2.0000 mg | INTRAMUSCULAR | Status: DC | PRN

## 2015-04-02 MED ORDER — MECLIZINE HCL 25 MG PO TABS
25.0000 mg | ORAL_TABLET | Freq: Once | ORAL | Status: AC
  Administered 2015-04-02: 25 mg via ORAL
  Filled 2015-04-02: qty 1

## 2015-04-02 MED ORDER — ACETAMINOPHEN 650 MG RE SUPP: 650.0000 mg | Freq: Four times a day (QID) | RECTAL | Status: DC | PRN

## 2015-04-02 MED ORDER — SODIUM CHLORIDE 0.9 % IV BOLUS (SEPSIS)
500.0000 mL | Freq: Once | INTRAVENOUS | Status: AC
  Administered 2015-04-02: 500 mL via INTRAVENOUS

## 2015-04-02 MED ORDER — CARVEDILOL 6.25 MG PO TABS
6.2500 mg | ORAL_TABLET | Freq: Two times a day (BID) | ORAL | Status: DC
  Administered 2015-04-02 – 2015-04-05 (×6): 6.25 mg via ORAL
  Filled 2015-04-02 (×6): qty 1

## 2015-04-02 MED ORDER — HYDRALAZINE HCL 50 MG PO TABS
50.0000 mg | ORAL_TABLET | Freq: Three times a day (TID) | ORAL | Status: DC
  Administered 2015-04-02 – 2015-04-05 (×8): 50 mg via ORAL
  Filled 2015-04-02 (×8): qty 1

## 2015-04-02 MED ORDER — SENNOSIDES-DOCUSATE SODIUM 8.6-50 MG PO TABS: 1.0000 | ORAL_TABLET | Freq: Every evening | ORAL | Status: DC | PRN

## 2015-04-02 MED ORDER — HEPARIN SODIUM (PORCINE) 5000 UNIT/ML IJ SOLN: 5000.0000 [IU] | Freq: Three times a day (TID) | INTRAMUSCULAR | Status: DC

## 2015-04-02 MED ORDER — ONDANSETRON HCL 4 MG/2ML IJ SOLN: 4.0000 mg | Freq: Four times a day (QID) | INTRAMUSCULAR | Status: DC | PRN

## 2015-04-02 MED ORDER — CLOPIDOGREL BISULFATE 75 MG PO TABS
75.0000 mg | ORAL_TABLET | Freq: Every day | ORAL | Status: DC
  Administered 2015-04-03 – 2015-04-05 (×3): 75 mg via ORAL
  Filled 2015-04-02 (×4): qty 1

## 2015-04-02 MED ORDER — SODIUM CHLORIDE 0.9 % IJ SOLN
3.0000 mL | Freq: Two times a day (BID) | INTRAMUSCULAR | Status: DC
  Administered 2015-04-03 – 2015-04-04 (×3): 3 mL via INTRAVENOUS

## 2015-04-02 MED ORDER — NITROGLYCERIN 0.4 MG SL SUBL: 0.4000 mg | SUBLINGUAL_TABLET | SUBLINGUAL | Status: DC | PRN

## 2015-04-02 MED ORDER — ISOSORBIDE DINITRATE 10 MG PO TABS
5.0000 mg | ORAL_TABLET | Freq: Two times a day (BID) | ORAL | Status: DC
  Administered 2015-04-02 – 2015-04-05 (×6): 5 mg via ORAL
  Filled 2015-04-02 (×6): qty 1

## 2015-04-02 MED ORDER — PANTOPRAZOLE SODIUM 40 MG IV SOLR
40.0000 mg | INTRAVENOUS | Status: DC
  Administered 2015-04-02 – 2015-04-04 (×3): 40 mg via INTRAVENOUS
  Filled 2015-04-02 (×3): qty 40

## 2015-04-02 MED ORDER — SODIUM CHLORIDE 0.9 % IV SOLN
250.0000 mL | INTRAVENOUS | Status: DC | PRN
Start: 2015-04-02 — End: 2015-04-05

## 2015-04-02 MED ORDER — ONDANSETRON HCL 4 MG PO TABS: 4.0000 mg | ORAL_TABLET | Freq: Four times a day (QID) | ORAL | Status: DC | PRN

## 2015-04-02 MED ORDER — ROSUVASTATIN CALCIUM 20 MG PO TABS
20.0000 mg | ORAL_TABLET | Freq: Every day | ORAL | Status: DC
  Administered 2015-04-03 – 2015-04-04 (×2): 20 mg via ORAL
  Filled 2015-04-02 (×3): qty 1

## 2015-04-02 NOTE — ED Notes (Signed)
Patient will be returning to the room after XR

## 2015-04-02 NOTE — Progress Notes (Signed)
ANTICOAGULATION CONSULT NOTE - Initial Consult  Pharmacy Consult for Coumadin Indication: atrial fibrillation  No Known Allergies  Patient Measurements: Height: 6' (182.9 cm) Weight: 262 lb 5.6 oz (119 kg) IBW/kg (Calculated) : 77.6 Heparin Dosing Weight:   Vital Signs: Temp: 97.8 F (36.6 C) (07/29 1730) Temp Source: Oral (07/29 1730) BP: 170/62 mmHg (07/29 1730) Pulse Rate: 61 (07/29 1730)  Labs:  Recent Labs  04/02/15 1135 04/02/15 1708  HGB 14.0  --   HCT 39.1  --   PLT 95*  --   LABPROT  --  34.1*  INR  --  3.46*  CREATININE 1.74*  --     Estimated Creatinine Clearance: 51.9 mL/min (by C-G formula based on Cr of 1.74).   Medical History: Past Medical History  Diagnosis Date  . Hypertension   . CHF (congestive heart failure)   . CAD in native artery 1999  . PAF (paroxysmal atrial fibrillation)   . AICD (automatic cardioverter/defibrillator) present   . Type II diabetes mellitus   . Hypercholesterolemia   . Myocardial infarction "several"  . OSA on CPAP   . History of hiatal hernia   . GERD (gastroesophageal reflux disease)     "occasionally" (04/02/2015)  . Stroke     "I've had 2; my right leg & arm slightly weaker since" (04/02/2015)  . Arthritis     "occasionally in my right hand" (04/02/2015)  . Anxiety   . Depression   . PTSD (post-traumatic stress disorder)     Medications:  Scheduled:  . carvedilol  6.25 mg Oral BID WC  . [START ON 04/03/2015] clopidogrel  75 mg Oral Daily  . FLUoxetine  40 mg Oral QHS  . hydrALAZINE  50 mg Oral TID  . isosorbide dinitrate  5 mg Oral BID  . omega-3 acid ethyl esters  3 g Oral BID  . pantoprazole (PROTONIX) IV  40 mg Intravenous Q24H  . [START ON 04/03/2015] rosuvastatin  20 mg Oral q1800  . sodium chloride  3 mL Intravenous Q12H    Assessment: 69 yr old male with extensive heart history who takes coumadin for a.fib. Presented to the ED with weakness, dizzyness and intermittent left chest pain. Troponin  was neg in the ED. Pt is to be admitted for observation and pharmacy was asked to dose his coumadin. Home dose of coumadin is 7.5 mg on Tue, Wed, Thurs, Sat and Sun. He takes 5 mg on Mon and Fri. INR is 3.46. Goal of Therapy:  INR 2-3  Plan:  Since the INR is higher than desired goal, will not give coumadin today. Will f/u with AM INR and dose accordingly.  Minta Balsam 04/02/2015,7:36 PM

## 2015-04-02 NOTE — ED Notes (Signed)
Pt taken from triage to xray. Xray made aware to bring back to A12.

## 2015-04-02 NOTE — ED Provider Notes (Signed)
I saw and evaluated the patient, reviewed the resident's note and I agree with the findings and plan.   EKG Interpretation   Date/Time:  Friday April 02 2015 11:17:06 EDT Ventricular Rate:  56 PR Interval:  312 QRS Duration: 88 QT Interval:  422 QTC Calculation: 407 R Axis:   -8 Text Interpretation:  Sinus bradycardia with 1st degree A-V block Inferior  infarct , age undetermined Abnormal ECG Confirmed by Sherril Shipman MD, Hilton Saephan  [phone]) on 04/02/2015 11:47:46 AM      69 year old male with history of ACS and last drug-eluting stent 6 months ago presents with weakness, dizziness, intermittent mild left chest pressure since last night. Weakness was so profound this morning he couldn't stand. Was in a hot car all day yesterday. Labs and clinical presentation consistent with dehydration versus atypical ACS symptoms. Patient also has history of stroke that he says presented similarly but he has no neurologic deficits or objective findings currently. Plan for light IVF hydration and obs d/t high risk demographic.  Leo Grosser, MD 04/02/15 1254

## 2015-04-02 NOTE — Progress Notes (Signed)
04/02/2015 5:44 PM  Pt admitted to 6E20 via stretcher from the ED.  Pt denies pain or SOB at this time.  Full assessment to EPIC, vitals stable, skin intact. Tele box 20 placed on patient, confirmed with CCMD.  Pt oriented to room/unit, and was instructed on how to utilize the call bell, to which he verbalized understanding.  Pt is a high fall risk due to dizziness and positive orthostatic vitals in the ED.  Bed alarm turned on.  Family currently at bedside.  Will monitor patient. Princella Pellegrini

## 2015-04-02 NOTE — H&P (Signed)
[facility] History and Physical                                                                                    Vernon Briggs, is a 69 y.o. male  MRN: [id number]   DOB - 1946-08-08  Admit Date - 04/02/2015  Outpatient Primary MD for the patient is [name], MD of [place] is Dr. Lynelle Smoke Cardiologist is also at the [facility] but from [facility].  Referring Physician:  Dr. Merrilyn Puma  Chief Complaint:   Chief Complaint  Patient presents with  . Weakness  . Dizziness  . 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  He presents to the emergency department today with acute onset of generalized weakness and mild chest pressure.  The patient reports he just finished cardiac rehabilitation not long ago.  He normally ambulates about with a cane and was in his usual state of health yesterday until approximately 8:30 PM. At that time he felt the onset of mild chest pressure, headache and generalized weakness as he had great difficulty getting to bed and ended up sleeping in the recliner. He states his symptoms felt like his previous stroke.  This morning when he awoke he had great difficulty getting up to go urinate. He made it back to his recliner and sat down. When his wife came to check on him at 10 AM and he could not stand at all. His wife called a neighbor for help in order to lift the patient and transfer him to the car to come to the emergency department.  The patient denies chest pain, but reports a light chest pressure in his left chest that does not radiate. He has not taken any medication to relieve the chest pressure. It is constant in nature.  EKG shows no frank ST changes and point-of-care troponin in the ER is 0. The patient reports more frequent urination but denies any pain, odor or dysuria.  In the emergency department his creatinine is elevated to 1.74 from a baseline of 1.29. His urinalysis appears negative for infection.  Review of Systems   In addition to the HPI above,  No Fever-chills, No changes with Vision or hearing, No problems swallowing food or Liquids, Cough or new  Shortness of Breath, No Abdominal pain, No Nausea or Vomiting, Bowel movements are regular, No Blood in stool or Urine, No dysuria, No new skin rashes or bruises, No new joints pains-aches,  No new weakness, tingling, numbness in any extremity, No recent weight gain or loss, A full 10 point Review of Systems was done, except as stated above, all other Review of Systems were negative.  Past Medical History  Past Medical History  Diagnosis Date  . Hypertension   . Diabetes mellitus without complication   . Stroke   . CHF (congestive heart failure)   . CAD in native artery 1999  . Myocardial infarction   . S/P CABG x 4 1999    LIMA-LAD, SVG-OM1, SVG-RI, SVG-dRCA  Past Surgical History  Procedure Laterality Date  . Coronary artery bypass graft    . Heart stent      Dr. Ronnald Ramp in [place], [place]  . Cardiac defibrillator placement      [place]  . Left heart catheterization with coronary/graft angiogram N/A 09/14/2014    Procedure: LEFT HEART CATHETERIZATION WITH Beatrix Fetters;  Surgeon: Leonie Man, MD;  Location: [facility] CATH LAB;  Service: Cardiovascular;  Laterality: N/A;  . Percutaneous coronary stent intervention (pci-s) Left 09/14/2014    Procedure: PERCUTANEOUS CORONARY STENT INTERVENTION (PCI-S);  Surgeon: Leonie Man, MD;  Location: [facility] CATH LAB;  Service: Cardiovascular;  Laterality: Left;      Social History History  Substance Use Topics  . Smoking status: Never Smoker   . Smokeless tobacco: Not on file  . Alcohol Use: Yes     Comment: seldom   he lives at home with his wife and normally walks with a cane  Family History He denies any history of kidney disease  in his immediate family.  Prior to Admission medications   Medication Sig Start Date End Date Taking? Authorizing Provider  acetaminophen (TYLENOL) 325 MG tablet Take 2 tablets (650 mg total) by mouth every 4 (four) hours as needed for headache or mild pain. 09/15/14  Yes Luke K Kilroy, PA-C  carvedilol (COREG) 12.5 MG tablet Take 12.5 mg by mouth 2 (two) times daily with a meal.   Yes Historical Provider, MD  clopidogrel (PLAVIX) 75 MG tablet Take 75 mg by mouth daily.   Yes Historical Provider, MD  cyanocobalamin (,VITAMIN B-12,) 1000 MCG/ML injection Inject 1,000 mcg into the muscle every 30 (thirty) days. 30th of every month   Yes Historical Provider, MD  FLUoxetine (PROZAC) 20 MG capsule Take 40 mg by mouth at bedtime.    Yes Historical Provider, MD  glipiZIDE (GLUCOTROL) 10 MG tablet Take 10 mg by mouth 2 (two) times daily before a meal.   Yes Historical Provider, MD  hydrALAZINE (APRESOLINE) 50 MG tablet Take 1 tablet (50 mg total) by mouth 3 (three) times daily. 09/15/14  Yes Erlene Quan, PA-C  hydroxypropyl methylcellulose / hypromellose (ISOPTO TEARS / GONIOVISC) 2.5 % ophthalmic solution Place 1 drop into both eyes 2 (two) times daily as needed for dry eyes.    Yes Historical Provider, MD  insulin glargine (LANTUS) 100 UNIT/ML injection Inject 55 Units into the skin at bedtime.    Yes Historical Provider, MD  insulin regular (NOVOLIN R,HUMULIN R) 100 units/mL injection Inject 10 Units into the skin daily before supper.    Yes Historical Provider, MD  isosorbide dinitrate (ISORDIL) 10 MG tablet Take 5 mg by mouth 2 (two) times daily. Take 1/2 tab BID   Yes Historical Provider, MD  ketoconazole (NIZORAL) 2 % cream Apply 1 application topically daily as needed for irritation.   Yes Historical Provider, MD  lisinopril (PRINIVIL,ZESTRIL) 40 MG tablet Take 1 tablet (40 mg total) by mouth at bedtime. Patient taking differently: Take 40 mg by mouth every morning.  09/15/14  Yes Luke K Kilroy, PA-C   loperamide (IMODIUM A-D) 2 MG tablet Take 1 mg by mouth daily.    Yes Historical Provider, MD  magnesium oxide (MAG-OX) 400 MG tablet Take 400 mg by mouth 2 (two) times daily.   Yes Historical Provider, MD  multivitamin-iron-minerals-folic acid (CENTRUM) chewable tablet Chew 1 tablet by mouth daily.   Yes Historical Provider, MD  nitroGLYCERIN (NITROSTAT) 0.4 MG SL tablet Place 1 tablet (0.4  mg total) under the tongue every 5 (five) minutes x 3 doses as needed for chest pain. 09/15/14  Yes Erlene Quan, PA-C  omega-3 acid ethyl esters (LOVAZA) 1 G capsule Take 3 g by mouth 2 (two) times daily.   Yes Historical Provider, MD  rosuvastatin (CRESTOR) 20 MG tablet Take 20 mg by mouth daily.   Yes Historical Provider, MD  spironolactone (ALDACTONE) 25 MG tablet Take 25 mg by mouth daily.   Yes Historical Provider, MD  torsemide (DEMADEX) 20 MG tablet Take 80 mg by mouth daily.    Yes Historical Provider, MD  warfarin (COUMADIN) 5 MG tablet Take 7.5 mg by mouth daily at 6 PM.    Yes Historical Provider, MD  amLODipine (NORVASC) 10 MG tablet Take 1 tablet (10 mg total) by mouth daily. Patient not taking: Reported on 04/02/2015 09/15/14   Erlene Quan, PA-C  metFORMIN (GLUCOPHAGE) 500 MG tablet Take 1 tablet (500 mg total) by mouth 2 (two) times daily with a meal. Patient not taking: Reported on 04/02/2015 09/17/14   Erlene Quan, PA-C    No Known Allergies  Physical Exam  Vitals  Blood pressure 170/62, pulse 61, temperature 97.8 F (36.6 C), temperature source Oral, resp. rate 16, height 6' (1.829 m), weight 119 kg (262 lb 5.6 oz), SpO2 98 %.   General: Alert, orientated, lying in bed in NAD, wife at bedside  Psych:  Normal affect and insight, Not Suicidal or Homicidal, Awake Alert, Oriented X 3.  Neuro:   No F.N deficits, residual left sided leg weakness from previous CVA  ENT:  Ears and Eyes appear Normal, Conjunctivae clear, PER. Moist oral mucosa without erythema or exudates.  Neck:   Supple, No lymphadenopathy appreciated  Respiratory:  Symmetrical chest wall movement, Good air movement bilaterally, CTAB.  Cardiac:  RRR, No Murmurs, 1+ LE edema noted, no JVD.  Chest pain is not reproducible on palpation.  Abdomen:  Positive bowel sounds, Soft, Non tender,protuberant,  No masses appreciated  Skin:  No Cyanosis, Normal Skin Turgor, No Skin Rash or Bruise.  Extremities:  Able to move all 4. 5/5 strength in each,  no effusions.  Data Review  CBC  Recent Labs Lab 04/02/15 1135  WBC 6.8  HGB 14.0  HCT 39.1  PLT 95*  MCV 92.9  MCH 33.3  MCHC 35.8  RDW 13.5    Chemistries   Recent Labs Lab 04/02/15 1135  NA 139  K 5.3*  CL 103  CO2 26  GLUCOSE 180*  BUN 33*  CREATININE 1.74*  CALCIUM 9.2     Urinalysis    Component Value Date/Time   COLORURINE YELLOW 04/02/2015 1341   APPEARANCEUR CLEAR 04/02/2015 1341   LABSPEC 1.010 04/02/2015 1341   PHURINE 7.0 04/02/2015 1341   GLUCOSEU NEGATIVE 04/02/2015 1341   HGBUR NEGATIVE 04/02/2015 1341   BILIRUBINUR NEGATIVE 04/02/2015 1341   KETONESUR NEGATIVE 04/02/2015 1341   PROTEINUR NEGATIVE 04/02/2015 1341   UROBILINOGEN 0.2 04/02/2015 1341   NITRITE NEGATIVE 04/02/2015 1341   LEUKOCYTESUR NEGATIVE 04/02/2015 1341    Imaging results:   Dg Chest 2 View  04/02/2015   CLINICAL DATA:  69 year old male with weakness and dizziness  EXAM: CHEST  2 VIEW  COMPARISON:  Prior chest x-ray 10/20/2012  FINDINGS: Left subclavian approach cardiac rhythm maintenance device. Leads project over the right atrium and right ventricle. Patient is status post median sternotomy with evidence of prior multivessel CABG. Stable cardiomegaly and enlargement of the main pulmonary  artery. No evidence of pulmonary edema, pleural effusion or pneumothorax. No new focal airspace consolidation. No acute osseous abnormality.  IMPRESSION: Stable chest x-ray without evidence of acute cardiopulmonary process.   Electronically Signed   By:  Jacqulynn Cadet M.D.   On: 04/02/2015 12:03    My personal review of EKG: Bradycardia, SR, No ST changes noted.   Assessment & Plan  Principal Problem:   Acute renal failure Active Problems:   Chest pain   Weakness generalized   History of CVA (cerebrovascular accident)   Automatic implantable cardioverter-defibrillator in situ   Type II diabetes mellitus with complication   Essential hypertension   S/P CABG x 12-1997   CAD S/P CFX DES 09/14/14   PAF (paroxysmal atrial fibrillation)   Chronic anticoagulation-Coumadin   Acute renal failure Likely prerenal secondary to dehydration and ACE inhibitor and diuretics Will not bolus with IV fluids at this time. Simply hold diuretics and ACE inhibitor Urine does not appear infected. Chest x-ray is clear. A.m. bmet  Acute generalized weakness Uncertain etiology. Currently on B-12 supplementation. Possibly due to acute renal failure/dehydration.  Doubt stroke despite his previous history. Patient is on aspirin/Plavix/Coumadin. No new focal deficits. Check orthostatic vital signs. PT/OT evaluation.  Will check LFTs, TSH.  Mild chest pain Patient had a drug-eluting stent placed in January 2016. He is currently on Coumadin, aspirin, Plavix Will cycle cardiac enzymes.  Consider calling cardiology if they are significantly elevated.  Thrombocytopenia Chronic.  Platelets are stable compared to January 2016.  Diabetes mellitus-uncontrolled We'll continue Lantus and NovoLog. Check hemoglobin A1c. Patient reports episodes of hypoglycemia.   Per his wife he is somewhat noncompliant with his medications. Will change to 70/30 bid and SSI - sensitive. Please titrate as appropriate.  Mild bradycardia Does not complain of dizziness.  I am not sure this is the cause of his weakness or if he is symptomatic from this. Will decrease the dosage of his Coreg and continue to monitor on telemetry.  Paroxysmal atrial fibrillation Currently rate  controlled. Coreg was decreased on admission as he is bradycardic. Continue Coumadin per pharmacy.   Consultants Called:  None  Family Communication:   Wife at bedside  Code Status:  DO NOT RESUSCITATE  Condition:  Stable  Potential Disposition: To home in 48 hours pending workup results.  Time spent in minutes : [address]  PA-C on 04/02/2015 at 5:52 PM Between 7am to 7pm - Pager - [phone] After 7pm go to [email or web address] - password [email or web address] And look for the night coverage person covering me after hours  [facility]

## 2015-04-02 NOTE — ED Notes (Addendum)
Report attempted. Charge nurse to call back.

## 2015-04-02 NOTE — ED Provider Notes (Signed)
CSN: [id number]     Arrival date & time 04/02/15  1108 History   First MD Initiated Contact with Patient 04/02/15 1141     Chief Complaint  Patient presents with  . Weakness  . Dizziness  . Chest Pain     (Consider location/radiation/quality/duration/timing/severity/associated sxs/prior Treatment) HPI  Vernon Briggs is a 69yo M with CAD x 4 CABG, PCI in 2011 and 09/2014 on coumadin and plavix (last echo 09/2014 EF 35-40%), DM2 on insulin, HTN, HLD, paroxysmal AF, CVA x 2, and ICM s/p ICD in 2013 who presents with generalized weakness, dizziness, and left-sided chest pressure that started last night. He says he has had on-and-off issues with weakness and dizziness, particularly during his cardiac rehab last month, and noticed it was worse again last night when he was sitting at home. He notices particularly when he gets up that he gets weak 'all over', dizzy, with occasional mild generalized headache and a non-radiating chest pressure in his left side. He tried 'sleeping it off' but his symptoms persisted today which prompted him coming in. He has had no malaise, fever, vision changes, diaphoresis, nausea, vomiting, palpitations, shortness of breath, urinary or bowel symptoms, or any other issues at this time. CBG last night and today checked at home were in the 160s - 170s.  Past Medical History  Diagnosis Date  . Hypertension   . Diabetes mellitus without complication   . Stroke   . CHF (congestive heart failure)   . CAD in native artery 1999  . Myocardial infarction   . S/P CABG x 4 1999    LIMA-LAD, SVG-OM1, SVG-RI, SVG-dRCA   Past Surgical History  Procedure Laterality Date  . Coronary artery bypass graft    . Heart stent      Dr. Ronnald Ramp in [place], [place]  . Cardiac defibrillator placement      [place]  . Left heart catheterization with coronary/graft angiogram N/A 09/14/2014    Procedure: LEFT HEART CATHETERIZATION WITH Beatrix Fetters;  Surgeon: Leonie Man, MD;   Location: [facility] CATH LAB;  Service: Cardiovascular;  Laterality: N/A;  . Percutaneous coronary stent intervention (pci-s) Left 09/14/2014    Procedure: PERCUTANEOUS CORONARY STENT INTERVENTION (PCI-S);  Surgeon: Leonie Man, MD;  Location: [facility] CATH LAB;  Service: Cardiovascular;  Laterality: Left;   No family history on file. History  Substance Use Topics  . Smoking status: Never Smoker   . Smokeless tobacco: Not on file  . Alcohol Use: Yes     Comment: seldom    Review of Systems  HENT: Negative for mouth sores.   Eyes: Negative for visual disturbance.  Respiratory: Positive for chest tightness.   Cardiovascular: Negative for leg swelling.  Endocrine: Negative for polyuria.  Genitourinary: Negative for dysuria and flank pain.  Neurological: Positive for light-headedness. Negative for tremors, seizures, syncope, facial asymmetry and speech difficulty.  Hematological: Does not bruise/bleed easily.  Psychiatric/Behavioral: Negative for behavioral problems and agitation.  All other systems reviewed and are negative.     Allergies  Review of patient's allergies indicates no known allergies.  Home Medications   Prior to Admission medications   Medication Sig Start Date End Date Taking? Authorizing Provider  acetaminophen (TYLENOL) 325 MG tablet Take 2 tablets (650 mg total) by mouth every 4 (four) hours as needed for headache or mild pain. 09/15/14   Erlene Quan, PA-C  amLODipine (NORVASC) 10 MG tablet Take 1 tablet (10 mg total) by mouth daily. Patient taking differently: Take 5 mg  by mouth daily. Take 1/2 tab daily (5mg  total) 09/15/14   Erlene Quan, PA-C  carvedilol (COREG) 12.5 MG tablet Take 12.5 mg by mouth 2 (two) times daily with a meal.    Historical Provider, MD  clopidogrel (PLAVIX) 75 MG tablet Take 75 mg by mouth daily.    Historical Provider, MD  cyanocobalamin (,VITAMIN B-12,) 1000 MCG/ML injection Inject 1,000 mcg into the muscle every 30 (thirty) days.     Historical Provider, MD  FLUoxetine (PROZAC) 20 MG capsule Take 20 mg by mouth at bedtime.    Historical Provider, MD  glipiZIDE (GLUCOTROL) 10 MG tablet Take 10 mg by mouth 2 (two) times daily before a meal.    Historical Provider, MD  hydrALAZINE (APRESOLINE) 50 MG tablet Take 1 tablet (50 mg total) by mouth 3 (three) times daily. 09/15/14   Erlene Quan, PA-C  hydroxypropyl methylcellulose / hypromellose (ISOPTO TEARS / GONIOVISC) 2.5 % ophthalmic solution Place 1 drop into both eyes 2 (two) times daily.    Historical Provider, MD  insulin glargine (LANTUS) 100 UNIT/ML injection Inject 55 Units into the skin at bedtime.     Historical Provider, MD  insulin regular (NOVOLIN R,HUMULIN R) 100 units/mL injection Inject 8 Units into the skin 2 (two) times daily before a meal.     Historical Provider, MD  isosorbide dinitrate (ISORDIL) 10 MG tablet Take 5 mg by mouth 2 (two) times daily. Take 1/2 tab BID    Historical Provider, MD  ketoconazole (NIZORAL) 2 % cream Apply 1 application topically daily as needed for irritation.    Historical Provider, MD  lisinopril (PRINIVIL,ZESTRIL) 40 MG tablet Take 1 tablet (40 mg total) by mouth at bedtime. 09/15/14   Erlene Quan, PA-C  loperamide (IMODIUM A-D) 2 MG tablet Take 2 mg by mouth as needed for diarrhea or loose stools.    Historical Provider, MD  magnesium oxide (MAG-OX) 400 MG tablet Take 400 mg by mouth 2 (two) times daily.    Historical Provider, MD  metFORMIN (GLUCOPHAGE) 500 MG tablet Take 1 tablet (500 mg total) by mouth 2 (two) times daily with a meal. Patient taking differently: Take 250 mg by mouth 2 (two) times daily with a meal. Pt taking 500mg  1/2 tab BID 09/17/14   Erlene Quan, PA-C  multivitamin-iron-minerals-folic acid (CENTRUM) chewable tablet Chew 1 tablet by mouth daily.    Historical Provider, MD  nitroGLYCERIN (NITROSTAT) 0.4 MG SL tablet Place 1 tablet (0.4 mg total) under the tongue every 5 (five) minutes x 3 doses as needed for  chest pain. 09/15/14   Erlene Quan, PA-C  omega-3 acid ethyl esters (LOVAZA) 1 G capsule Take 3 g by mouth 2 (two) times daily.    Historical Provider, MD  rosuvastatin (CRESTOR) 20 MG tablet Take 20 mg by mouth daily.    Historical Provider, MD  spironolactone (ALDACTONE) 25 MG tablet Take 25 mg by mouth daily.    Historical Provider, MD  torsemide (DEMADEX) 20 MG tablet Take 40 mg by mouth daily. Take 40mg  once daily    Historical Provider, MD  warfarin (COUMADIN) 5 MG tablet Take 7.5 mg by mouth daily. Take 1 and 1/2 tablets on Tuesday, Wednesday, Thursday, Saturday and Sunday then take 1 tablet on Monday and Friday    Historical Provider, MD   BP 137/70 mmHg  Pulse 50  Temp(Src) 97.5 F (36.4 C) (Oral)  Resp 11  Ht 6' (1.829 m)  Wt 252 lb (114.306 kg)  BMI 34.17 kg/m2  SpO2 99% Physical Exam  Constitutional: He is oriented to person, place, and time. He appears well-developed and well-nourished. No distress.  HENT:  Head: Normocephalic and atraumatic.  Mouth/Throat: Oropharynx is clear and moist. No oropharyngeal exudate.  Eyes: Conjunctivae are normal. Pupils are equal, round, and reactive to light.  Neck: Normal range of motion. Neck supple.  Cardiovascular: Normal rate, regular rhythm, normal heart sounds and intact distal pulses.  Exam reveals no gallop and no friction rub.   No murmur heard. ICD in place  Pulmonary/Chest: Effort normal and breath sounds normal. No respiratory distress. He has no wheezes.  Abdominal: Soft. He exhibits no distension and no mass. There is no tenderness. There is no rebound and no guarding.  Musculoskeletal: Normal range of motion. He exhibits no edema or tenderness.  Neurological: He is alert and oriented to person, place, and time.  Cranial nerves 2-12 grossly intact, 5/5 strength symmetrically in upper and lower extremities. Normal sensation to fine touch symmetrically. Normal coordination.  Skin: Skin is warm and dry. He is not diaphoretic.   Psychiatric: He has a normal mood and affect. His behavior is normal.  Nursing note and vitals reviewed.   ED Course  Procedures (including critical care time) Labs Review Labs Reviewed  BASIC METABOLIC PANEL - Abnormal; Notable for the following:    Potassium 5.3 (*)    Glucose, Bld 180 (*)    BUN 33 (*)    Creatinine, Ser 1.74 (*)    GFR calc non Af Amer 38 (*)    GFR calc Af Amer 44 (*)    All other components within normal limits  CBC - Abnormal; Notable for the following:    RBC 4.21 (*)    All other components within normal limits  CBG MONITORING, ED - Abnormal; Notable for the following:    Glucose-Capillary 172 (*)    All other components within normal limits  URINALYSIS, ROUTINE W REFLEX MICROSCOPIC (NOT AT [facility]  BRAIN NATRIURETIC PEPTIDE  I-STAT TROPOININ, ED    Imaging Review Dg Chest 2 View  04/02/2015   CLINICAL DATA:  69 year old male with weakness and dizziness  EXAM: CHEST  2 VIEW  COMPARISON:  Prior chest x-ray 10/20/2012  FINDINGS: Left subclavian approach cardiac rhythm maintenance device. Leads project over the right atrium and right ventricle. Patient is status post median sternotomy with evidence of prior multivessel CABG. Stable cardiomegaly and enlargement of the main pulmonary artery. No evidence of pulmonary edema, pleural effusion or pneumothorax. No new focal airspace consolidation. No acute osseous abnormality.  IMPRESSION: Stable chest x-ray without evidence of acute cardiopulmonary process.   Electronically Signed   By: Jacqulynn Cadet M.D.   On: 04/02/2015 12:03     EKG Interpretation   Date/Time:  Friday April 02 2015 11:17:06 EDT Ventricular Rate:  56 PR Interval:  312 QRS Duration: 88 QT Interval:  422 QTC Calculation: 407 R Axis:   -8 Text Interpretation:  Sinus bradycardia with 1st degree A-V block Inferior  infarct , age undetermined Abnormal ECG Confirmed by KNOTT MD, DANIEL  [phone]) on 04/02/2015 11:47:46 AM      MDM   Final  diagnoses:  None    Vernon Briggs is a 69yo M with CAD x 4 CABG, PCI in 2011 and 09/2014 on coumadin and plavix (last echo 09/2014 EF 35-40%), DM2 on insulin, HTN, HLD, paroxysmal AF, CVA x 2, and ICM s/p ICD in 2013 who presents with generalized weakness, dizziness, and  left-sided chest pressure that started last night. He is weak, especially when trying to stand up, to the point that he can barely stand. EKG is unchanged from previous exams, first troponins negative, BUN and Cr slightly elevated. Presentation more suggestive of dehydration vs. Atypical ACS. Given ASA and 574ml NS bolus, with modest improvement in clinical picture. Admit to hospitalist service for ACS rule-out.    Norval Gable, MD 04/02/15 1441  Leo Grosser, MD 04/02/15 1501

## 2015-04-02 NOTE — ED Notes (Signed)
Onset last night dizziness, chest pain, and general weakness. Alert and answering and following commands appropriate.

## 2015-04-02 NOTE — Progress Notes (Signed)
Patient complaining of increased dizziness, NP on call made aware. New orders received and carried out. Will continue to monitor.

## 2015-04-02 NOTE — ED Notes (Signed)
CBG 172

## 2015-04-03 DIAGNOSIS — Z7901 Long term (current) use of anticoagulants: Secondary | ICD-10-CM

## 2015-04-03 DIAGNOSIS — R079 Chest pain, unspecified: Secondary | ICD-10-CM

## 2015-04-03 DIAGNOSIS — R531 Weakness: Secondary | ICD-10-CM

## 2015-04-03 DIAGNOSIS — E118 Type 2 diabetes mellitus with unspecified complications: Secondary | ICD-10-CM

## 2015-04-03 DIAGNOSIS — I10 Essential (primary) hypertension: Secondary | ICD-10-CM

## 2015-04-03 DIAGNOSIS — N179 Acute kidney failure, unspecified: Principal | ICD-10-CM

## 2015-04-03 DIAGNOSIS — I480 Paroxysmal atrial fibrillation: Secondary | ICD-10-CM

## 2015-04-03 LAB — HEMOGLOBIN A1C
Hgb A1c MFr Bld: 7.2 % — ABNORMAL HIGH (ref 4.8–5.6)
Mean Plasma Glucose: 160 mg/dL

## 2015-04-03 LAB — CBC
HCT: 37.1 % — ABNORMAL LOW (ref 39.0–52.0)
Hemoglobin: 13 g/dL (ref 13.0–17.0)
MCH: 32.7 pg (ref 26.0–34.0)
MCHC: 35 g/dL (ref 30.0–36.0)
MCV: 93.5 fL (ref 78.0–100.0)
PLATELETS: 78 10*3/uL — AB (ref 150–400)
RBC: 3.97 MIL/uL — AB (ref 4.22–5.81)
RDW: 13.8 % (ref 11.5–15.5)
WBC: 6.7 10*3/uL (ref 4.0–10.5)

## 2015-04-03 LAB — TROPONIN I
TROPONIN I: 0.04 ng/mL — AB (ref <0.031)
Troponin I: 0.03 ng/mL (ref <0.031)

## 2015-04-03 LAB — BASIC METABOLIC PANEL
Anion gap: 6 (ref 5–15)
BUN: 34 mg/dL — ABNORMAL HIGH (ref 6–20)
CO2: 26 mmol/L (ref 22–32)
CREATININE: 1.6 mg/dL — AB (ref 0.61–1.24)
Calcium: 8.8 mg/dL — ABNORMAL LOW (ref 8.9–10.3)
Chloride: 105 mmol/L (ref 101–111)
GFR calc Af Amer: 48 mL/min — ABNORMAL LOW (ref >60)
GFR, EST NON AFRICAN AMERICAN: 42 mL/min — AB (ref >60)
Glucose, Bld: 182 mg/dL — ABNORMAL HIGH (ref 65–99)
Potassium: 4.7 mmol/L (ref 3.5–5.1)
SODIUM: 137 mmol/L (ref 135–145)

## 2015-04-03 LAB — PROTIME-INR
INR: 3.06 — AB (ref 0.00–1.49)
Prothrombin Time: 31.1 seconds — ABNORMAL HIGH (ref 11.6–15.2)

## 2015-04-03 LAB — GLUCOSE, CAPILLARY
GLUCOSE-CAPILLARY: 198 mg/dL — AB (ref 65–99)
GLUCOSE-CAPILLARY: 203 mg/dL — AB (ref 65–99)
Glucose-Capillary: 236 mg/dL — ABNORMAL HIGH (ref 65–99)

## 2015-04-03 MED ORDER — WARFARIN SODIUM 5 MG PO TABS
5.0000 mg | ORAL_TABLET | Freq: Once | ORAL | Status: AC
  Administered 2015-04-03: 5 mg via ORAL
  Filled 2015-04-03: qty 1

## 2015-04-03 MED ORDER — WARFARIN - PHARMACIST DOSING INPATIENT
Freq: Every day | Status: DC
  Administered 2015-04-04: 18:00:00

## 2015-04-03 NOTE — Progress Notes (Addendum)
[facility]                                                                              Patient Demographics  Vernon Briggs, is a 69 y.o. male, DOB - 25-Jun-1946, YIF:[id number]  Admit date - 04/02/2015   Admitting Physician Theodis Blaze, MD  Outpatient Primary MD for the patient is [name], MD  LOS - 1   Chief Complaint  Patient presents with  . Weakness  . Dizziness  . Chest Pain      HPI on 04/02/2015 by Ms. 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 He presents to the emergency department today with acute onset of generalized weakness and mild chest pressure. The patient reports he just finished cardiac rehabilitation not long ago. He normally ambulates about with a cane and was in his usual state of health yesterday until approximately 8:30 PM. At that time he felt the onset of mild chest pressure, headache and generalized weakness as he had great difficulty getting to bed and ended up sleeping in the recliner. He states his symptoms felt like his previous stroke. This morning when he awoke he had great difficulty getting up to go urinate. He made it back to his recliner and sat down. When his wife came to check on him at 10 AM and he could not stand at all. His wife called a neighbor for help in order to lift the patient and transfer him to the car to come to the emergency department. The patient denies chest pain, but reports a light chest pressure in his left chest that does not radiate. He has not taken any medication to relieve the chest pressure. It is constant in nature. EKG shows no frank ST changes and point-of-care troponin in the ER is 0. The patient reports more frequent urination but denies any pain, odor or dysuria. In the emergency department his creatinine is elevated to 1.74 from a  baseline of 1.29. His urinalysis appears negative for infection.  Assessment & Plan   Acute renal failure -Likely multifactorial including medications versus dehydration -Diuretics, ACE inhibitor, metformin held -Slight improvement in creatinine to 1.6 -Will give gentle IV fluids  Acute generalized weakness -Uncertain etiology.  -Currently on B-12 supplementation. -Pending physical and occupational therapy evaluations -TSH 1.774, LFTs WNL  Mild chest pain -Patient had a drug-eluting stent placed in January 2016.  -Currently on Coumadin, aspirin and plavix -Troponin 0.04 -Patient denies current chest pain  Thrombocytopenia -Chronic, baseline 80s, currently 78 -Continue to monitor   Diabetes mellitus-uncontrolled -Hemoglobin A1c 7.2 -Metformin and Lantus held -Per wife, upon admission, patient has been noncompliant with his medications -Continue insulin sliding scale CBG monitoring  Mild bradycardia -Currently resolved -Coreg reduced -Continue to monitor -May be contributing to his weakness  Paroxysmal atrial fibrillation -Currently rate and rhythm controlled.  -Coreg was decreased on admission due to bradycardic.  -Continue Coumadin per pharmacy, INR 3.06  Chronic combined systolic/diastolic heart failure -Currently compensated -Diuretics and lisinopril held due to AKI -last echocardiogram 09/09/2014: EF 86-76%, Grade 1 diastolic dysfunction -  Monitor daily weights, intake, and output.  Code Status: DNR  Family Communication: None at bedside  Disposition Plan: Admitted, continue to monitor Cr, pending PT/OT  Time Spent in minutes   30 minutes  Procedures  None  Consults   None  DVT Prophylaxis  Coumadin  Lab Results  Component Value Date   PLT 78* 04/03/2015    Medications  Scheduled Meds: . carvedilol  6.25 mg Oral BID WC  . clopidogrel  75 mg Oral Daily  . FLUoxetine  40 mg Oral QHS  . hydrALAZINE  50 mg Oral TID  . insulin aspart  0-9 Units  Subcutaneous TID WC  . isosorbide dinitrate  5 mg Oral BID  . omega-3 acid ethyl esters  3 g Oral BID  . pantoprazole (PROTONIX) IV  40 mg Intravenous Q24H  . rosuvastatin  20 mg Oral q1800  . sodium chloride  3 mL Intravenous Q12H   Continuous Infusions:  PRN Meds:.sodium chloride, acetaminophen **OR** acetaminophen, ketoconazole, loperamide, morphine injection, nitroGLYCERIN, ondansetron **OR** ondansetron (ZOFRAN) IV, polyvinyl alcohol, senna-docusate, sodium chloride  Antibiotics    Anti-infectives    None      Subjective:   Zai Chmiel seen and examined today.  Patient denies current chest pain.  Denies any shortness of breath, dizziness, headache. Does complain of feeling weak. Denies abdominal pain, nausea or vomiting.    Objective:   Filed Vitals:   04/02/15 2132 04/03/15 0445 04/03/15 0630 04/03/15 0927  BP: 114/46 148/73 174/73   Pulse: 59 58 56 70  Temp: 98.3 F (36.8 C) 98.6 F (37 C) 98.3 F (36.8 C)   TempSrc: Oral Oral Oral   Resp: 18 16 16    Height:      Weight: 119 kg (262 lb 5.6 oz)     SpO2: 99% 98% 99%     Wt Readings from Last 3 Encounters:  04/02/15 119 kg (262 lb 5.6 oz)  11/05/14 120.8 kg (266 lb 5.1 oz)  09/15/14 118.3 kg (260 lb 12.9 oz)     Intake/Output Summary (Last 24 hours) at 04/03/15 1024 Last data filed at 04/03/15 0600  Gross per 24 hour  Intake      0 ml  Output    600 ml  Net   -600 ml    Exam  General: Well developed, well nourished, no distress  HEENT: NCAT, mucous membranes moist.   Cardiovascular: S1 S2 auscultated, RRR, no murmurs  Respiratory: Clear to auscultation   Abdomen: Soft, obese, nontender, nondistended, + bowel sounds  Extremities: warm dry without cyanosis clubbing or edema  Neuro: AAOx3, nonfocal    Data Review   Micro Results Recent Results (from the past 240 hour(s))  Urine culture     Status: None (Preliminary result)   Collection Time: 04/02/15  1:41 PM  Result Value Ref Range  Status   Specimen Description URINE, RANDOM  Final   Special Requests NONE  Final   Culture NO GROWTH < 24 HOURS  Final   Report Status PENDING  Incomplete    Radiology Reports Dg Chest 2 View  04/02/2015   CLINICAL DATA:  69 year old male with weakness and dizziness  EXAM: CHEST  2 VIEW  COMPARISON:  Prior chest x-ray 10/20/2012  FINDINGS: Left subclavian approach cardiac rhythm maintenance device. Leads project over the right atrium and right ventricle. Patient is status post median sternotomy with evidence of prior multivessel CABG. Stable cardiomegaly and enlargement of the main pulmonary artery. No evidence of pulmonary edema, pleural  effusion or pneumothorax. No new focal airspace consolidation. No acute osseous abnormality.  IMPRESSION: Stable chest x-ray without evidence of acute cardiopulmonary process.   Electronically Signed   By: Jacqulynn Cadet M.D.   On: 04/02/2015 12:03    CBC  Recent Labs Lab 04/02/15 1135 04/03/15 0550  WBC 6.8 6.7  HGB 14.0 13.0  HCT 39.1 37.1*  PLT 95* 78*  MCV 92.9 93.5  MCH 33.3 32.7  MCHC 35.8 35.0  RDW 13.5 13.8    Chemistries   Recent Labs Lab 04/02/15 1135 04/02/15 1820 04/03/15 0550  NA 139  --  137  K 5.3*  --  4.7  CL 103  --  105  CO2 26  --  26  GLUCOSE 180*  --  182*  BUN 33*  --  34*  CREATININE 1.74*  --  1.60*  CALCIUM 9.2  --  8.8*  AST  --  31  --   ALT  --  32  --   ALKPHOS  --  57  --   BILITOT  --  0.7  --    ------------------------------------------------------------------------------------------------------------------ estimated creatinine clearance is 56.4 mL/min (by C-G formula based on Cr of 1.6). ------------------------------------------------------------------------------------------------------------------  Recent Labs  04/02/15 1820  HGBA1C 7.2*   ------------------------------------------------------------------------------------------------------------------ No results for input(s): CHOL,  HDL, LDLCALC, TRIG, CHOLHDL, LDLDIRECT in the last 72 hours. ------------------------------------------------------------------------------------------------------------------  Recent Labs  04/02/15 1820  TSH 1.774   ------------------------------------------------------------------------------------------------------------------ No results for input(s): VITAMINB12, FOLATE, FERRITIN, TIBC, IRON, RETICCTPCT in the last 72 hours.  Coagulation profile  Recent Labs Lab 04/02/15 1708 04/03/15 0550  INR 3.46* 3.06*    No results for input(s): DDIMER in the last 72 hours.  Cardiac Enzymes  Recent Labs Lab 04/02/15 1820 04/02/15 2324 04/03/15 0550  TROPONINI 0.03 0.03 0.04*   ------------------------------------------------------------------------------------------------------------------ Invalid input(s): POCBNP    Tamiyah Moulin D.O. on 04/03/2015 at 10:24 AM  Between 7am to 7pm - Pager - [phone]  After 7pm go to [email or web address] - password [email or web address]  And look for the night coverage person covering for me after hours  [facility] Office  [phone]

## 2015-04-03 NOTE — Clinical Documentation Improvement (Signed)
"  CHF" is documented in the H&P and progress notes.  Please document the Acuity and Type of CHF monitored and treated this admission, including any associated condition(s), suspected or known cause(s), and any historical or supportive diagnostic information.  Acuity - Acute, Chronic, Acute on Chronic, Other, Unable to clinically determine  Type - Systolic, Diastolic, Combined, Other, Unable to clinically determine  Clinical Information documented in the current [place] 35-40% in 09/2014, home medications include - Coreg, Lisinopril, Aldactone, Torsemide.   Thank You, Erling Conte ,RN Clinical Documentation Specialist:  [phone] [place] Information Management

## 2015-04-03 NOTE — Progress Notes (Signed)
ANTICOAGULATION CONSULT NOTE - Follow-up  Pharmacy Consult for Coumadin Indication: atrial fibrillation  No Known Allergies  Patient Measurements: Height: 6' (182.9 cm) Weight: 262 lb 5.6 oz (119 kg) IBW/kg (Calculated) : 77.6  Vital Signs: Temp: 98 F (36.7 C) (07/30 1026) Temp Source: Oral (07/30 1026) BP: 159/66 mmHg (07/30 1026) Pulse Rate: 60 (07/30 1026)  Labs:  Recent Labs  04/02/15 1135 04/02/15 1708 04/02/15 1820 04/02/15 2324 04/03/15 0550  HGB 14.0  --   --   --  13.0  HCT 39.1  --   --   --  37.1*  PLT 95*  --   --   --  78*  LABPROT  --  34.1*  --   --  31.1*  INR  --  3.46*  --   --  3.06*  CREATININE 1.74*  --   --   --  1.60*  TROPONINI  --   --  0.03 0.03 0.04*    Estimated Creatinine Clearance: 56.4 mL/min (by C-G formula based on Cr of 1.6).  Assessment: 69 yr old male with extensive heart history who takes chronic coumadin for afib, presented to the ED with weakness, dizzyness and intermittent left chest pain. Warfarin was held yesterday d/t elevated INR. INR is very slightly elevated still today. No bleeding noted. H/H stable and platelets are low but this is a chronic issue.   Goal of Therapy:  INR 2-3  Plan:  - Coumadin 5mg  PO x 1 tonight (lower dose than home dose since INR is still mildly elevated) - Daily INR  Salome Arnt, PharmD, BCPS Pager # [phone] 04/03/2015 11:51 AM

## 2015-04-03 NOTE — Progress Notes (Signed)
BP this morning 174/73.  Asymptomatic.  Notified Raliegh Ip Schorr NP.  Awaiting orders.  Will continue to monitor.

## 2015-04-03 NOTE — Evaluation (Signed)
Physical Therapy Evaluation Patient Details Name: Vernon Briggs MRN: [id number] DOB: [birth year] Today's Date: 04/03/2015   History of Present Illness  Patient is a 69 yo male admitted 04/02/15 with dizziness, weakness, chest pain.  Patient with acute renal failure, orthstatic hypotension, bradycardia.  PMH:  CAD, MI, CABG, ICD, PAF, DM, HTN, CVA, CHF  Clinical Impression  Patient presents with problems listed below.  Will benefit from acute PT to maximize functional independence prior to discharge.  Patient complains of dizziness that is constant.  He has difficulty describing dizziness, but reports it is present in sitting and standing.  Did note patient with orthostatic hypotension per vitals earlier today. Noted RLE weakness in stance - per patient chronic weakness since last stroke.  Was ambulating with cane for short distances pta, and just completed Cardiac Rehab program.  Will continue to assess mobility in am.  At this point, recommend HHPT at d/c for continued therapy.    Follow Up Recommendations Home health PT;Supervision/Assistance - 24 hour    Equipment Recommendations  None recommended by PT    Recommendations for Other Services       Precautions / Restrictions Precautions Precautions: Fall Precaution Comments: Dizziness and RLE weakness Restrictions Weight Bearing Restrictions: No      Mobility  Bed Mobility               General bed mobility comments: Patient in chair as PT entered room  Transfers Overall transfer level: Needs assistance Equipment used: Rolling walker (2 wheeled) Transfers: Sit to/from Stand Sit to Stand: Min assist         General transfer comment: Verbal cues for hand placement and to scoot to edge of chair.  Assist to steady and to power up to standing.  Unsteady in stance, with lean to right side.  Increases as dizziness increases.  Attempted to step in place.  Patient reports increased dizziness and RLE buckled.  Assisted patient  to return to sitting in recliner.  Ambulation/Gait                Stairs            Wheelchair Mobility    Modified Rankin (Stroke Patients Only)       Balance Overall balance assessment: Needs assistance         Standing balance support: Single extremity supported Standing balance-Leahy Scale: Fair Standing balance comment: Assist and UE support required for any dynamic activity                             Pertinent Vitals/Pain Pain Assessment: No/denies pain    Home Living Family/patient expects to be discharged to:: Private residence Living Arrangements: Spouse/significant other Available Help at Discharge: Family;Available 24 hours/day Type of Home: House Home Access: Stairs to enter Entrance Stairs-Rails: Psychiatric nurse of Steps: 3 Home Layout: One level Home Equipment: Cane - single point;Walker - 2 wheels;Wheelchair - Brewing technologist;Wheelchair - power Photographer in truck)      Prior Function Level of Independence: Independent with assistive device(s)   Gait / Transfers Assistance Needed: Patient uses cane for short distances.  Uses electric wheelchair for longer distances           Hand Dominance        Extremity/Trunk Assessment   Upper Extremity Assessment: Overall WFL for tasks assessed (Patient c/o RUE "jumping" at times)           Lower Extremity  Assessment: Generalized weakness;RLE deficits/detail RLE Deficits / Details: Noted RLE "giving away" in stance along with dizziness;  Weakness from prior CVA?       Communication   Communication: No difficulties (Very talkative.  Repeats self)  Cognition Arousal/Alertness: Awake/alert Behavior During Therapy: Anxious Overall Cognitive Status: No family/caregiver present to determine baseline cognitive functioning (Patient reports he sees "a shrink" every month.)                      General Comments General comments (skin  integrity, edema, etc.): Patient reports the dizziness and weakness "feel just like when I had my stroke".  Patient with normal oculomotor function, smooth pursuits, saccades, head shaking, VOR.     Exercises        Assessment/Plan    PT Assessment Patient needs continued PT services  PT Diagnosis Difficulty walking;Generalized weakness   PT Problem List Decreased strength;Decreased activity tolerance;Decreased balance;Decreased mobility;Decreased knowledge of use of DME;Obesity (Dizziness)  PT Treatment Interventions DME instruction;Gait training;Functional mobility training;Therapeutic activities;Balance training;Patient/family education   PT Goals (Current goals can be found in the Care Plan section) Acute Rehab PT Goals Patient Stated Goal: To walk PT Goal Formulation: With patient Time For Goal Achievement: 04/10/15 Potential to Achieve Goals: Good    Frequency Min 3X/week   Barriers to discharge        Co-evaluation               End of Session Equipment Utilized During Treatment: Gait belt Activity Tolerance: Patient limited by fatigue (Dizziness) Patient left: in chair;with call bell/phone within reach Nurse Communication: Mobility status         Time: 1811-1900 PT Time Calculation (min) (ACUTE ONLY): 49 min   Charges:   PT Evaluation $Initial PT Evaluation Tier I: 1 Procedure PT Treatments $Therapeutic Activity: 23-37 mins   PT G CodesDespina Pole 04-04-15, 8:07 PM Carita Pian. Sanjuana Kava, [place] Pager [phone]

## 2015-04-04 DIAGNOSIS — Z9861 Coronary angioplasty status: Secondary | ICD-10-CM

## 2015-04-04 DIAGNOSIS — I2510 Atherosclerotic heart disease of native coronary artery without angina pectoris: Secondary | ICD-10-CM

## 2015-04-04 LAB — CBC
HCT: 36.1 % — ABNORMAL LOW (ref 39.0–52.0)
HEMOGLOBIN: 12.7 g/dL — AB (ref 13.0–17.0)
MCH: 33.2 pg (ref 26.0–34.0)
MCHC: 35.2 g/dL (ref 30.0–36.0)
MCV: 94.5 fL (ref 78.0–100.0)
PLATELETS: 68 10*3/uL — AB (ref 150–400)
RBC: 3.82 MIL/uL — ABNORMAL LOW (ref 4.22–5.81)
RDW: 13.8 % (ref 11.5–15.5)
WBC: 6.6 10*3/uL (ref 4.0–10.5)

## 2015-04-04 LAB — GLUCOSE, CAPILLARY
GLUCOSE-CAPILLARY: 212 mg/dL — AB (ref 65–99)
Glucose-Capillary: 215 mg/dL — ABNORMAL HIGH (ref 65–99)
Glucose-Capillary: 155 mg/dL — ABNORMAL HIGH (ref 65–99)
Glucose-Capillary: 205 mg/dL — ABNORMAL HIGH (ref 65–99)

## 2015-04-04 LAB — BASIC METABOLIC PANEL
Anion gap: 8 (ref 5–15)
BUN: 36 mg/dL — ABNORMAL HIGH (ref 6–20)
CHLORIDE: 105 mmol/L (ref 101–111)
CO2: 24 mmol/L (ref 22–32)
Calcium: 8.7 mg/dL — ABNORMAL LOW (ref 8.9–10.3)
Creatinine, Ser: 1.62 mg/dL — ABNORMAL HIGH (ref 0.61–1.24)
GFR calc Af Amer: 48 mL/min — ABNORMAL LOW (ref >60)
GFR, EST NON AFRICAN AMERICAN: 41 mL/min — AB (ref >60)
GLUCOSE: 211 mg/dL — AB (ref 65–99)
POTASSIUM: 4.5 mmol/L (ref 3.5–5.1)
Sodium: 137 mmol/L (ref 135–145)

## 2015-04-04 LAB — URINE CULTURE: Culture: NO GROWTH

## 2015-04-04 LAB — PROTIME-INR
INR: 2.52 — ABNORMAL HIGH (ref 0.00–1.49)
Prothrombin Time: 26.8 seconds — ABNORMAL HIGH (ref 11.6–15.2)

## 2015-04-04 MED ORDER — WARFARIN SODIUM 7.5 MG PO TABS
7.5000 mg | ORAL_TABLET | Freq: Once | ORAL | Status: AC
  Administered 2015-04-04: 7.5 mg via ORAL
  Filled 2015-04-04: qty 1

## 2015-04-04 MED ORDER — SODIUM CHLORIDE 0.9 % IV SOLN
INTRAVENOUS | Status: AC
  Administered 2015-04-04 – 2015-04-05 (×2): via INTRAVENOUS

## 2015-04-04 NOTE — Progress Notes (Addendum)
[facility]                                                                              Patient Demographics  Vernon Briggs, is a 69 y.o. male, DOB - 10/31/1945, YCX:[id number]  Admit date - 04/02/2015   Admitting Physician Theodis Blaze, MD  Outpatient Primary MD for the patient is [name], MD  LOS - 2   Chief Complaint  Patient presents with  . Weakness  . Dizziness  . Chest Pain      HPI on 04/02/2015 by Ms. 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 He presents to the emergency department today with acute onset of generalized weakness and mild chest pressure. The patient reports he just finished cardiac rehabilitation not long ago. He normally ambulates about with a cane and was in his usual state of health yesterday until approximately 8:30 PM. At that time he felt the onset of mild chest pressure, headache and generalized weakness as he had great difficulty getting to bed and ended up sleeping in the recliner. He states his symptoms felt like his previous stroke. This morning when he awoke he had great difficulty getting up to go urinate. He made it back to his recliner and sat down. When his wife came to check on him at 10 AM and he could not stand at all. His wife called a neighbor for help in order to lift the patient and transfer him to the car to come to the emergency department. The patient denies chest pain, but reports a light chest pressure in his left chest that does not radiate. He has not taken any medication to relieve the chest pressure. It is constant in nature. EKG shows no frank ST changes and point-of-care troponin in the ER is 0. The patient reports more frequent urination but denies any pain, odor or dysuria. In the emergency department his creatinine is elevated to 1.74 from a  baseline of 1.29. His urinalysis appears negative for infection.  Assessment & Plan   Acute renal failure -Likely multifactorial including medications versus dehydration -Diuretics, ACE inhibitor, metformin held -Slight improvement in creatinine to 1.62 -Will give gentle IV fluids, NS 37ml/hr  Acute generalized weakness -Uncertain etiology. Patient denies any recent trauma or back pain. -Currently on B-12 supplementation. -PT recommended HH.  OT pending eval -TSH 1.774, LFTs WNL  Mild chest pain -Patient had a drug-eluting stent placed in January 2016.  -Currently on Coumadin, aspirin and plavix.  Spoke with Dr. Harl Bowie regarding these medications and patient's thrombocytopenia.  Recommended stopping aspirin and continuing plavix/coumadin. -Troponin 0.04 -Patient denies current chest pain  Thrombocytopenia -Chronic, baseline 80s, currently 68 -Continue to monitor   Diabetes mellitus-uncontrolled -Hemoglobin A1c 7.2 -Metformin and Lantus held -Per wife, upon admission, patient has been noncompliant with his medications -Continue insulin sliding scale CBG monitoring  Mild bradycardia -Currently resolved -Coreg reduced -Continue to monitor -May be contributing to his weakness  Paroxysmal atrial fibrillation -Currently rate and rhythm controlled.  -Coreg was decreased on admission due to bradycardic.  -Continue Coumadin  per pharmacy, INR 2.52  Chronic combined systolic/diastolic heart failure -Currently compensated -Diuretics and lisinopril held due to AKI -last echocardiogram 09/09/2014: EF 79-02%, Grade 1 diastolic dysfunction -Monitor daily weights, intake, and output.  Code Status: DNR  Family Communication: None at bedside  Disposition Plan: Admitted, continue to monitor Cr, pending OT  Time Spent in minutes   30 minutes  Procedures  None  Consults   Cardiology, Dr. Harl Bowie, in passing  DVT Prophylaxis  Coumadin  Lab Results  Component Value Date   PLT  68* 04/04/2015    Medications  Scheduled Meds: . carvedilol  6.25 mg Oral BID WC  . clopidogrel  75 mg Oral Daily  . FLUoxetine  40 mg Oral QHS  . hydrALAZINE  50 mg Oral TID  . insulin aspart  0-9 Units Subcutaneous TID WC  . isosorbide dinitrate  5 mg Oral BID  . omega-3 acid ethyl esters  3 g Oral BID  . pantoprazole (PROTONIX) IV  40 mg Intravenous Q24H  . rosuvastatin  20 mg Oral q1800  . sodium chloride  3 mL Intravenous Q12H  . warfarin  7.5 mg Oral ONCE-1800  . Warfarin - Pharmacist Dosing Inpatient   Does not apply q1800   Continuous Infusions: . sodium chloride     PRN Meds:.sodium chloride, acetaminophen **OR** acetaminophen, ketoconazole, loperamide, morphine injection, nitroGLYCERIN, ondansetron **OR** ondansetron (ZOFRAN) IV, polyvinyl alcohol, senna-docusate, sodium chloride  Antibiotics    Anti-infectives    None      Subjective:   Vernon Briggs seen and examined today.  Patient denies current chest pain or shortness of breath, dizziness, headache. Continues to complain of feeling weak. Denies abdominal pain, nausea or vomiting.    Objective:   Filed Vitals:   04/03/15 1026 04/03/15 1708 04/03/15 2215 04/04/15 0526  BP: 159/66 138/58 104/86 132/64  Pulse: 60 62 59 61  Temp: 98 F (36.7 C) 98.2 F (36.8 C) 97.9 F (36.6 C) 98.1 F (36.7 C)  TempSrc: Oral Oral Oral Oral  Resp: 18 18 18 20   Height:      Weight:    118.071 kg (260 lb 4.8 oz)  SpO2: 98% 100% 98% 97%    Wt Readings from Last 3 Encounters:  04/04/15 118.071 kg (260 lb 4.8 oz)  11/05/14 120.8 kg (266 lb 5.1 oz)  09/15/14 118.3 kg (260 lb 12.9 oz)     Intake/Output Summary (Last 24 hours) at 04/04/15 1123 Last data filed at 04/04/15 0600  Gross per 24 hour  Intake    360 ml  Output    700 ml  Net   -340 ml    Exam  General: Well developed, well nourished, no distress  HEENT: NCAT, mucous membranes moist.   Cardiovascular: S1 S2 auscultated, RRR, no  murmurs  Respiratory: Clear to auscultation   Abdomen: Soft, obese, nontender, nondistended, + bowel sounds  Extremities: warm dry without cyanosis clubbing or edema  Neuro: AAOx3, nonfocal   Data Review   Micro Results Recent Results (from the past 240 hour(s))  Urine culture     Status: None   Collection Time: 04/02/15  1:41 PM  Result Value Ref Range Status   Specimen Description URINE, RANDOM  Final   Special Requests NONE  Final   Culture NO GROWTH 2 DAYS  Final   Report Status 04/04/2015 FINAL  Final    Radiology Reports Dg Chest 2 View  04/02/2015   CLINICAL DATA:  69 year old male with weakness and dizziness  EXAM:  CHEST  2 VIEW  COMPARISON:  Prior chest x-ray 10/20/2012  FINDINGS: Left subclavian approach cardiac rhythm maintenance device. Leads project over the right atrium and right ventricle. Patient is status post median sternotomy with evidence of prior multivessel CABG. Stable cardiomegaly and enlargement of the main pulmonary artery. No evidence of pulmonary edema, pleural effusion or pneumothorax. No new focal airspace consolidation. No acute osseous abnormality.  IMPRESSION: Stable chest x-ray without evidence of acute cardiopulmonary process.   Electronically Signed   By: Jacqulynn Cadet M.D.   On: 04/02/2015 12:03    CBC  Recent Labs Lab 04/02/15 1135 04/03/15 0550 04/04/15 0445  WBC 6.8 6.7 6.6  HGB 14.0 13.0 12.7*  HCT 39.1 37.1* 36.1*  PLT 95* 78* 68*  MCV 92.9 93.5 94.5  MCH 33.3 32.7 33.2  MCHC 35.8 35.0 35.2  RDW 13.5 13.8 13.8    Chemistries   Recent Labs Lab 04/02/15 1135 04/02/15 1820 04/03/15 0550 04/04/15 0445  NA 139  --  137 137  K 5.3*  --  4.7 4.5  CL 103  --  105 105  CO2 26  --  26 24  GLUCOSE 180*  --  182* 211*  BUN 33*  --  34* 36*  CREATININE 1.74*  --  1.60* 1.62*  CALCIUM 9.2  --  8.8* 8.7*  AST  --  31  --   --   ALT  --  32  --   --   ALKPHOS  --  57  --   --   BILITOT  --  0.7  --   --     ------------------------------------------------------------------------------------------------------------------ estimated creatinine clearance is 55.5 mL/min (by C-G formula based on Cr of 1.62). ------------------------------------------------------------------------------------------------------------------  Recent Labs  04/02/15 1820  HGBA1C 7.2*   ------------------------------------------------------------------------------------------------------------------ No results for input(s): CHOL, HDL, LDLCALC, TRIG, CHOLHDL, LDLDIRECT in the last 72 hours. ------------------------------------------------------------------------------------------------------------------  Recent Labs  04/02/15 1820  TSH 1.774   ------------------------------------------------------------------------------------------------------------------ No results for input(s): VITAMINB12, FOLATE, FERRITIN, TIBC, IRON, RETICCTPCT in the last 72 hours.  Coagulation profile  Recent Labs Lab 04/02/15 1708 04/03/15 0550 04/04/15 0445  INR 3.46* 3.06* 2.52*    No results for input(s): DDIMER in the last 72 hours.  Cardiac Enzymes  Recent Labs Lab 04/02/15 1820 04/02/15 2324 04/03/15 0550  TROPONINI 0.03 0.03 0.04*   ------------------------------------------------------------------------------------------------------------------ Invalid input(s): POCBNP    Vernon Briggs D.O. on 04/04/2015 at 11:23 AM  Between 7am to 7pm - Pager - [phone]  After 7pm go to [email or web address] - password [email or web address]  And look for the night coverage person covering for me after hours  [facility] Office  [phone]

## 2015-04-04 NOTE — Progress Notes (Signed)
Physical Therapy Treatment Patient Details Name: Vernon Briggs MRN: [id number] DOB: December 30, 1945 Today's Date: 04/04/2015    History of Present Illness Patient is a 69 yo male admitted 04/02/15 with dizziness, weakness, chest pain.  Patient with acute renal failure, orthstatic hypotension, bradycardia.  PMH:  CAD, MI, CABG, defibrillator, PAF, DM, HTN, CVA, CHF    PT Comments    Patient with improved gait today, able to ambulate 98' with RW and min assist for safety.  Spoke with patient and wife regarding f/u HHPT for mobility and balance.  Per wife, patient has had ongoing RLE weakness and decreased balance since prior stroke.   Follow Up Recommendations  Home health PT;Supervision/Assistance - 24 hour     Equipment Recommendations  None recommended by PT    Recommendations for Other Services       Precautions / Restrictions Precautions Precautions: Fall Precaution Comments: Dizziness and RLE weakness Restrictions Weight Bearing Restrictions: No    Mobility  Bed Mobility Overal bed mobility: Modified Independent             General bed mobility comments: Increased time for mobility  Transfers Overall transfer level: Needs assistance Equipment used: Rolling walker (2 wheeled) Transfers: Sit to/from Stand Sit to Stand: Min guard         General transfer comment: Verbal cues for hand placement.  Able to move to standing with no physical assist.  Min guard for safety.  Ambulation/Gait Ambulation/Gait assistance: Min assist;+2 safety/equipment Ambulation Distance (Feet): 42 Feet Assistive device: Rolling walker (2 wheeled) Gait Pattern/deviations: Step-through pattern;Decreased stride length;Decreased weight shift to right;Trunk flexed Gait velocity: Decreased Gait velocity interpretation: Below normal speed for age/gender General Gait Details: Verbal cues for safe use of RW, and to stand upright.  Patient continues to report RLE feels "like a noodle".  Able to  maintain balance during gait.  Assist for safety.   Stairs            Wheelchair Mobility    Modified Rankin (Stroke Patients Only)       Balance           Standing balance support: Single extremity supported Standing balance-Leahy Scale: Fair                      Cognition Arousal/Alertness: Awake/alert Behavior During Therapy: Anxious;Restless;Impulsive Overall Cognitive Status: History of cognitive impairments - at baseline (Decr safety awareness; repeats himself; wife manages meds)       Memory: Decreased short-term memory              Exercises      General Comments General comments (skin integrity, edema, etc.): Patient continues to report ongoing, constant dizziness.  Did not appear to impact mobility today.      Pertinent Vitals/Pain Pain Assessment: No/denies pain    Home Living                      Prior Function            PT Goals (current goals can now be found in the care plan section) Progress towards PT goals: Progressing toward goals    Frequency  Min 3X/week    PT Plan Current plan remains appropriate    Co-evaluation             End of Session Equipment Utilized During Treatment: Gait belt Activity Tolerance: Patient tolerated treatment well Patient left: in bed;with call bell/phone within reach;with bed alarm set;with family/visitor  present (sitting EOB)     Time: 7893-8101 PT Time Calculation (min) (ACUTE ONLY): 23 min  Charges:  $Gait Training: 23-37 mins                    G Codes:      Vernon Briggs 2015-04-14, 1:48 PM Vernon Briggs, Vernon Briggs Pager [phone]

## 2015-04-04 NOTE — Discharge Summary (Addendum)
Physician Discharge Summary  Vernon Briggs MOQ:[id number] DOB: Feb 04, 1946 DOA: 04/02/2015  PCP: [name], MD  Admit date: 04/02/2015 Discharge date: 04/05/2015  Time spent: 45 minutes  Recommendations for Outpatient Follow-up:  Patient will be discharged to home with home health PT.  Patient will need to follow up with primary care provider within one week of discharge for repeat CBC and BMP and discussion of blood pressure control.  Patient should continue medications as prescribed.  Patient should follow a heart healthy/carb modified diet.   Discharge Diagnoses:  Acute renal failure Acute generalized weakness Mild chest pain Thrombocytopenia Diabetes mellitus, type II Mild bradycardia Paroxysmal atrial fibrillation Chronic Combined systolic and systolic heart failure  Discharge Condition: Stable  Diet recommendation: Heart healthy/carb modified  Filed Weights   04/02/15 2132 04/04/15 0526 04/05/15 0616  Weight: 119 kg (262 lb 5.6 oz) 118.071 kg (260 lb 4.8 oz) 118.207 kg (260 lb 9.6 oz)    History of present illness:  on 04/02/2015 by Ms. 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 He presents to the emergency department today with acute onset of generalized weakness and mild chest pressure. The patient reports he just finished cardiac rehabilitation not long ago. He normally ambulates about with a cane and was in his usual state of health yesterday until approximately 8:30 PM. At that time he felt the onset of mild chest pressure, headache and generalized weakness as he had great difficulty getting to bed and ended up sleeping in the recliner. He states his symptoms felt like his previous stroke. This morning when he awoke he had great difficulty getting up to go urinate. He made it back to his recliner and  sat down. When his wife came to check on him at 10 AM and he could not stand at all. His wife called a neighbor for help in order to lift the patient and transfer him to the car to come to the emergency department. The patient denies chest pain, but reports a light chest pressure in his left chest that does not radiate. He has not taken any medication to relieve the chest pressure. It is constant in nature. EKG shows no frank ST changes and point-of-care troponin in the ER is 0. The patient reports more frequent urination but denies any pain, odor or dysuria. In the emergency department his creatinine is elevated to 1.74 from a baseline of 1.29. His urinalysis appears negative for infection.  Hospital Course:  Acute renal failure -Likely multifactorial including medications versus dehydration -Diuretics, ACE inhibitor, metformin held -Slight improvement in creatinine to 1.50 -Patient will need outpatient follow up with PCP regarding follow up -After reviewing the charge, patient's baseline has ranged from 0.9-1.3 (since 2008).  Acute generalized weakness -Uncertain etiology. Patient denies any recent trauma or back pain. -Currently on B-12 supplementation. -PT recommended HH.  -TSH 1.774, LFTs WNL  Mild chest pain -Patient had a drug-eluting stent placed in January 2016.  -Currently on Coumadin, aspirin and plavix. Spoke with Dr. Harl Bowie regarding these medications and patient's thrombocytopenia. Recommended stopping aspirin and continuing plavix/coumadin. -Troponin 0.04 -Patient denies current chest pain  Thrombocytopenia -Chronic, baseline 80s, currently 72 -Continue to monitor   Diabetes mellitus, type II -Hemoglobin A1c 7.2 -Metformin and Lantus held -Per wife, upon admission, patient has been noncompliant with his medications -Continue home regimen.  Metformin discontinued.  Mild bradycardia -Currently resolved -  Coreg reduced -Continue to monitor -May be contributing to  his weakness  Paroxysmal atrial fibrillation -Currently rate and rhythm controlled.  -Coreg was decreased on admission due to bradycardic.  -Continue Coumadin per pharmacy, INR 2.18  Chronic combined systolic/diastolic heart failure -Currently compensated -Diuretics and lisinopril held due to AKI -may resume upon discharge, would speak to PCP/cardiology regarding continuing all of these medications -last echocardiogram 09/09/2014: EF 62-70%, Grade 1 diastolic dysfunction -Monitor daily weights  Procedures  None  Consults  Cardiology, Dr. Harl Bowie, in passing  Discharge Exam: Filed Vitals:   04/05/15 0940  BP: 173/85  Pulse: 69  Temp: 98.4 F (36.9 C)  Resp: 18   Exam  General: Well developed, well nourished, no distress  HEENT: NCAT, mucous membranes moist.   Cardiovascular: S1 S2 auscultated, RRR, no murmurs  Respiratory: Clear to auscultation  Abdomen: Soft, obese, nontender, nondistended, + bowel sounds  Extremities: warm dry without cyanosis clubbing or edema  Neuro: AAOx3, nonfocal  Discharge Instructions      Discharge Instructions    Discharge instructions    Complete by:  As directed   Patient will be discharged to home with home health PT.  Patient will need to follow up with primary care provider within one week of discharge for repeat BMP and discussion of blood pressure control.  Patient should continue medications as prescribed.  Patient should follow a heart healthy/carb modified diet.            Medication List    STOP taking these medications        amLODipine 10 MG tablet  Commonly known as:  NORVASC     metFORMIN 500 MG tablet  Commonly known as:  GLUCOPHAGE     spironolactone 25 MG tablet  Commonly known as:  ALDACTONE      TAKE these medications        acetaminophen 325 MG tablet  Commonly known as:  TYLENOL  Take 2 tablets (650 mg total) by mouth every 4 (four) hours as needed for headache or mild pain.     carvedilol  12.5 MG tablet  Commonly known as:  COREG  Take 12.5 mg by mouth 2 (two) times daily with a meal.     clopidogrel 75 MG tablet  Commonly known as:  PLAVIX  Take 75 mg by mouth daily.     cyanocobalamin 1000 MCG/ML injection  Commonly known as:  (VITAMIN B-12)  Inject 1,000 mcg into the muscle every 30 (thirty) days. 30th of every month     FLUoxetine 20 MG capsule  Commonly known as:  PROZAC  Take 40 mg by mouth at bedtime.     glipiZIDE 10 MG tablet  Commonly known as:  GLUCOTROL  Take 10 mg by mouth 2 (two) times daily before a meal.     hydrALAZINE 50 MG tablet  Commonly known as:  APRESOLINE  Take 1 tablet (50 mg total) by mouth 3 (three) times daily.     hydroxypropyl methylcellulose / hypromellose 2.5 % ophthalmic solution  Commonly known as:  ISOPTO TEARS / GONIOVISC  Place 1 drop into both eyes 2 (two) times daily as needed for dry eyes.     insulin glargine 100 UNIT/ML injection  Commonly known as:  LANTUS  Inject 55 Units into the skin at bedtime.     insulin regular 100 units/mL injection  Commonly known as:  NOVOLIN R,HUMULIN R  Inject 10 Units into the skin daily before supper.  isosorbide dinitrate 10 MG tablet  Commonly known as:  ISORDIL  Take 5 mg by mouth 2 (two) times daily. Take 1/2 tab BID     ketoconazole 2 % cream  Commonly known as:  NIZORAL  Apply 1 application topically daily as needed for irritation.     lisinopril 40 MG tablet  Commonly known as:  PRINIVIL,ZESTRIL  Take 1 tablet (40 mg total) by mouth at bedtime.     loperamide 2 MG tablet  Commonly known as:  IMODIUM A-D  Take 1 mg by mouth daily.     magnesium oxide 400 MG tablet  Commonly known as:  MAG-OX  Take 400 mg by mouth 2 (two) times daily.     multivitamin-iron-minerals-folic acid chewable tablet  Chew 1 tablet by mouth daily.     nitroGLYCERIN 0.4 MG SL tablet  Commonly known as:  NITROSTAT  Place 1 tablet (0.4 mg total) under the tongue every 5 (five) minutes x  3 doses as needed for chest pain.     omega-3 acid ethyl esters 1 G capsule  Commonly known as:  LOVAZA  Take 3 g by mouth 2 (two) times daily.     rosuvastatin 20 MG tablet  Commonly known as:  CRESTOR  Take 20 mg by mouth daily.     torsemide 20 MG tablet  Commonly known as:  DEMADEX  Take 80 mg by mouth daily.     warfarin 5 MG tablet  Commonly known as:  COUMADIN  Take 7.5 mg by mouth daily at 6 PM.       No Known Allergies Follow-up Information    Follow up with [name], MD. Schedule an appointment as soon as possible for a visit in 1 week.   Specialty:  Internal Medicine   Why:  Hospital follow-up   Contact information:   [place] [id number] [phone]        The results of significant diagnostics from this hospitalization (including imaging, microbiology, ancillary and laboratory) are listed below for reference.    Significant Diagnostic Studies: Dg Chest 2 View  04/02/2015   CLINICAL DATA:  69 year old male with weakness and dizziness  EXAM: CHEST  2 VIEW  COMPARISON:  Prior chest x-ray 10/20/2012  FINDINGS: Left subclavian approach cardiac rhythm maintenance device. Leads project over the right atrium and right ventricle. Patient is status post median sternotomy with evidence of prior multivessel CABG. Stable cardiomegaly and enlargement of the main pulmonary artery. No evidence of pulmonary edema, pleural effusion or pneumothorax. No new focal airspace consolidation. No acute osseous abnormality.  IMPRESSION: Stable chest x-ray without evidence of acute cardiopulmonary process.   Electronically Signed   By: Jacqulynn Cadet M.D.   On: 04/02/2015 12:03    Microbiology: Recent Results (from the past 240 hour(s))  Urine culture     Status: None   Collection Time: 04/02/15  1:41 PM  Result Value Ref Range Status   Specimen Description URINE, RANDOM  Final   Special Requests NONE  Final   Culture NO GROWTH 2 DAYS  Final   Report  Status 04/04/2015 FINAL  Final     Labs: Basic Metabolic Panel:  Recent Labs Lab 04/02/15 1135 04/03/15 0550 04/04/15 0445 04/05/15 0443  NA 139 137 137 137  K 5.3* 4.7 4.5 4.3  CL 103 105 105 107  CO2 26 26 24 24   GLUCOSE 180* 182* 211* 198*  BUN 33* 34* 36* 32*  CREATININE 1.74* 1.60* 1.62*  1.50*  CALCIUM 9.2 8.8* 8.7* 8.4*   Liver Function Tests:  Recent Labs Lab 04/02/15 1820  AST 31  ALT 32  ALKPHOS 57  BILITOT 0.7  PROT 5.9*  ALBUMIN 3.5   No results for input(s): LIPASE, AMYLASE in the last 168 hours. No results for input(s): AMMONIA in the last 168 hours. CBC:  Recent Labs Lab 04/02/15 1135 04/03/15 0550 04/04/15 0445 04/05/15 0443  WBC 6.8 6.7 6.6 6.7  HGB 14.0 13.0 12.7* 12.8*  HCT 39.1 37.1* 36.1* 35.7*  MCV 92.9 93.5 94.5 92.7  PLT 95* 78* 68* 72*   Cardiac Enzymes:  Recent Labs Lab 04/02/15 1820 04/02/15 2324 04/03/15 0550  TROPONINI 0.03 0.03 0.04*   BNP: BNP (last 3 results)  Recent Labs  04/02/15 1135  BNP 156.3*    ProBNP (last 3 results) No results for input(s): PROBNP in the last 8760 hours.  CBG:  Recent Labs Lab 04/04/15 0736 04/04/15 1141 04/04/15 1655 04/04/15 2212 04/05/15 0753  GLUCAP 215* 212* 155* 205* 215*       Signed:  Cristal Ford  [facility] 04/05/2015, 10:43 AM

## 2015-04-04 NOTE — Discharge Instructions (Signed)

## 2015-04-04 NOTE — Progress Notes (Signed)
Utilization Review Completed.[place] Gipe T7/31/2016  

## 2015-04-04 NOTE — Progress Notes (Signed)
ANTICOAGULATION CONSULT NOTE - Follow-up  Pharmacy Consult for Coumadin Indication: atrial fibrillation  No Known Allergies  Patient Measurements: Height: 6' (182.9 cm) Weight: 260 lb 4.8 oz (118.071 kg) IBW/kg (Calculated) : 77.6  Vital Signs: Temp: 98.1 F (36.7 C) (07/31 0526) Temp Source: Oral (07/31 0526) BP: 132/64 mmHg (07/31 0526) Pulse Rate: 61 (07/31 0526)  Labs:  Recent Labs  04/02/15 1135 04/02/15 1708 04/02/15 1820 04/02/15 2324 04/03/15 0550 04/04/15 0445  HGB 14.0  --   --   --  13.0 12.7*  HCT 39.1  --   --   --  37.1* 36.1*  PLT 95*  --   --   --  78* 68*  LABPROT  --  34.1*  --   --  31.1* 26.8*  INR  --  3.46*  --   --  3.06* 2.52*  CREATININE 1.74*  --   --   --  1.60* 1.62*  TROPONINI  --   --  0.03 0.03 0.04*  --     Estimated Creatinine Clearance: 55.5 mL/min (by C-G formula based on Cr of 1.62).  Assessment: 69 yr old male with extensive heart history who takes chronic coumadin for afib, presented to the ED with weakness, dizzyness and intermittent left chest pain. Warfarin was held 7/29 d/t elevated INR. INR is now therapeutic. No bleeding noted. H/H stable and platelets are low but this is a chronic issue.   Goal of Therapy:  INR 2-3  Plan:  - Coumadin 7.5mg  PO x 1 tonight  - Daily INR  Salome Arnt, PharmD, BCPS Pager # [phone] 04/04/2015 11:09 AM

## 2015-04-05 LAB — CBC
HCT: 35.7 % — ABNORMAL LOW (ref 39.0–52.0)
HEMOGLOBIN: 12.8 g/dL — AB (ref 13.0–17.0)
MCH: 33.2 pg (ref 26.0–34.0)
MCHC: 35.9 g/dL (ref 30.0–36.0)
MCV: 92.7 fL (ref 78.0–100.0)
Platelets: 72 10*3/uL — ABNORMAL LOW (ref 150–400)
RBC: 3.85 MIL/uL — AB (ref 4.22–5.81)
RDW: 13.7 % (ref 11.5–15.5)
WBC: 6.7 10*3/uL (ref 4.0–10.5)

## 2015-04-05 LAB — BASIC METABOLIC PANEL
ANION GAP: 6 (ref 5–15)
BUN: 32 mg/dL — AB (ref 6–20)
CALCIUM: 8.4 mg/dL — AB (ref 8.9–10.3)
CO2: 24 mmol/L (ref 22–32)
CREATININE: 1.5 mg/dL — AB (ref 0.61–1.24)
Chloride: 107 mmol/L (ref 101–111)
GFR calc non Af Amer: 45 mL/min — ABNORMAL LOW (ref >60)
GFR, EST AFRICAN AMERICAN: 52 mL/min — AB (ref >60)
GLUCOSE: 198 mg/dL — AB (ref 65–99)
Potassium: 4.3 mmol/L (ref 3.5–5.1)
Sodium: 137 mmol/L (ref 135–145)

## 2015-04-05 LAB — GLUCOSE, CAPILLARY
Glucose-Capillary: 215 mg/dL — ABNORMAL HIGH (ref 65–99)
Glucose-Capillary: 214 mg/dL — ABNORMAL HIGH (ref 65–99)

## 2015-04-05 LAB — PROTIME-INR
INR: 2.18 — AB (ref 0.00–1.49)
PROTHROMBIN TIME: 24 s — AB (ref 11.6–15.2)

## 2015-04-05 NOTE — Progress Notes (Signed)
Discharged papers and its instruction printed,explained given to patient including the health/medication (coumadin) to patient and his wife.Questions answered to the patient and his wife satisfaction.

## 2015-04-05 NOTE — Evaluation (Signed)
Occupational Therapy Evaluation Patient Details Name: Vernon Briggs MRN: [id number] DOB: June 21, 1946 Today's Date: 04/05/2015    History of Present Illness Patient is a 69 yo male admitted 04/02/15 with dizziness, weakness, chest pain.  Patient with acute renal failure, orthstatic hypotension, bradycardia.  PMH:  CAD, MI, CABG, defibrillator, PAF, DM, HTN, CVA, CHF   Clinical Impression   Pt with baseline R LE weakness, dizziness and impaired cognition. Encouraged use of RW and supervision for safety with standing ADL and mobility. Pt overall performing at a set up to min guard level.  Will follow acutely.    Follow Up Recommendations  No OT follow up    Equipment Recommendations  None recommended by OT    Recommendations for Other Services       Precautions / Restrictions Precautions Precautions: Fall Precaution Comments: longstanding dizziness and RLE weakness Restrictions Weight Bearing Restrictions: No      Mobility Bed Mobility Overal bed mobility: Modified Independent                Transfers Overall transfer level: Needs assistance Equipment used: Rolling walker (2 wheeled) Transfers: Sit to/from Stand Sit to Stand: Min guard         General transfer comment: verbal cues for hand placement, no physical assist, min guard due to hx of dizziness and R LE weakness    Balance             Standing balance-Leahy Scale: Fair                              ADL Overall ADL's : Needs assistance/impaired Eating/Feeding: Independent;Sitting   Grooming: Wash/dry hands;Wash/dry face;Brushing hair;Standing;Min guard   Upper Body Bathing: Set up;Sitting   Lower Body Bathing: Sit to/from stand;Min guard   Upper Body Dressing : Set up;Sitting   Lower Body Dressing: Sit to/from stand;Min guard   Toilet Transfer: Ambulation;RW;Min guard   Toileting- Clothing Manipulation and Hygiene: Sit to/from stand;Min guard       Functional mobility  during ADLs: Rolling walker;Min guard       Vision     Perception     Praxis      Pertinent Vitals/Pain Pain Assessment: No/denies pain     Hand Dominance Right   Extremity/Trunk Assessment Upper Extremity Assessment Upper Extremity Assessment: Overall WFL for tasks assessed (reports impaired R UE since prior CVA)   Lower Extremity Assessment Lower Extremity Assessment: Defer to PT evaluation   Cervical / Trunk Assessment Cervical / Trunk Assessment: Normal   Communication Communication Communication: No difficulties (talkative, repeats himself)   Cognition Arousal/Alertness: Awake/alert Behavior During Therapy: WFL for tasks assessed/performed Overall Cognitive Status: History of cognitive impairments - at baseline       Memory: Decreased short-term memory             General Comments       Exercises       Shoulder Instructions      Home Living Family/patient expects to be discharged to:: Private residence Living Arrangements: Spouse/significant other Available Help at Discharge: Family;Available 24 hours/day Type of Home: House Home Access: Stairs to enter [employer] of Steps: 3 Entrance Stairs-Rails: Right;Left Home Layout: One level     Bathroom Shower/Tub: Teacher, early years/pre: Standard     Home Equipment: Cane - single point;Walker - 2 wheels;Wheelchair - Brewing technologist;Wheelchair - power;Grab bars - tub/shower  Prior Functioning/Environment Level of Independence: Independent with assistive device(s)  Gait / Transfers Assistance Needed: Patient uses cane for short distances.  Uses electric wheelchair for longer distances          OT Diagnosis: Generalized weakness;Cognitive deficits   OT Problem List: Decreased strength;Decreased activity tolerance;Impaired balance (sitting and/or standing);Decreased cognition;Decreased knowledge of use of DME or AE   OT Treatment/Interventions:  Self-care/ADL training;DME and/or AE instruction;Patient/family education;Balance training    OT Goals(Current goals can be found in the care plan section) Acute Rehab OT Goals Patient Stated Goal: To walk OT Goal Formulation: With patient Time For Goal Achievement: 04/12/15 Potential to Achieve Goals: Good ADL Goals Pt Will Perform Grooming: with supervision;standing Pt Will Perform Lower Body Bathing: with modified independence;sit to/from stand Pt Will Perform Lower Body Dressing: with modified independence;sit to/from stand Pt Will Transfer to Toilet: with supervision;ambulating;regular height toilet Pt Will Perform Toileting - Clothing Manipulation and hygiene: sit to/from stand;with modified independence Pt Will Perform Tub/Shower Transfer: ambulating;shower seat;rolling walker;Shower transfer  OT Frequency: Min 2X/week   Barriers to D/C:            Co-evaluation              End of Session Equipment Utilized During Treatment: Surveyor, mining Communication:  (wants immodium)  Activity Tolerance: Patient tolerated treatment well Patient left: in chair;with call bell/phone within reach   Time: [phone] OT Time Calculation (min): 27 min Charges:  OT General Charges $OT Visit: 1 Procedure OT Evaluation $Initial OT Evaluation Tier I: 1 Procedure OT Treatments $Self Care/Home Management : 8-22 mins G-Codes:    Malka So 04/05/2015, 11:04 AM

## 2017-02-01 ENCOUNTER — Emergency Department (HOSPITAL_COMMUNITY)
Admission: EM | Admit: 2017-02-01 | Discharge: 2017-02-01 | Disposition: A | Discharge status: Home / Self Care | Payer: Medicare Other | Attending: Emergency Medicine | Admitting: Emergency Medicine

## 2017-02-01 ENCOUNTER — Emergency Department (HOSPITAL_COMMUNITY): Payer: Medicare Other

## 2017-02-01 ENCOUNTER — Encounter (HOSPITAL_COMMUNITY): Payer: Self-pay | Admitting: Emergency Medicine

## 2017-02-01 DIAGNOSIS — Z8673 Personal history of transient ischemic attack (TIA), and cerebral infarction without residual deficits: Secondary | ICD-10-CM | POA: Diagnosis not present

## 2017-02-01 DIAGNOSIS — Z95810 Presence of automatic (implantable) cardiac defibrillator: Secondary | ICD-10-CM | POA: Diagnosis not present

## 2017-02-01 DIAGNOSIS — Z859 Personal history of malignant neoplasm, unspecified: Secondary | ICD-10-CM | POA: Insufficient documentation

## 2017-02-01 DIAGNOSIS — R4781 Slurred speech: Secondary | ICD-10-CM | POA: Diagnosis not present

## 2017-02-01 DIAGNOSIS — I2510 Atherosclerotic heart disease of native coronary artery without angina pectoris: Secondary | ICD-10-CM | POA: Diagnosis not present

## 2017-02-01 DIAGNOSIS — Z955 Presence of coronary angioplasty implant and graft: Secondary | ICD-10-CM | POA: Insufficient documentation

## 2017-02-01 DIAGNOSIS — R42 Dizziness and giddiness: Secondary | ICD-10-CM | POA: Diagnosis not present

## 2017-02-01 DIAGNOSIS — I110 Hypertensive heart disease with heart failure: Secondary | ICD-10-CM | POA: Diagnosis not present

## 2017-02-01 DIAGNOSIS — E119 Type 2 diabetes mellitus without complications: Secondary | ICD-10-CM | POA: Diagnosis not present

## 2017-02-01 DIAGNOSIS — I509 Heart failure, unspecified: Secondary | ICD-10-CM | POA: Insufficient documentation

## 2017-02-01 DIAGNOSIS — Z794 Long term (current) use of insulin: Secondary | ICD-10-CM | POA: Insufficient documentation

## 2017-02-01 DIAGNOSIS — Z87891 Personal history of nicotine dependence: Secondary | ICD-10-CM | POA: Diagnosis not present

## 2017-02-01 DIAGNOSIS — R531 Weakness: Secondary | ICD-10-CM

## 2017-02-01 DIAGNOSIS — Z7901 Long term (current) use of anticoagulants: Secondary | ICD-10-CM | POA: Diagnosis not present

## 2017-02-01 DIAGNOSIS — M6281 Muscle weakness (generalized): Secondary | ICD-10-CM | POA: Insufficient documentation

## 2017-02-01 HISTORY — DX: Malignant (primary) neoplasm, unspecified: C80.1

## 2017-02-01 LAB — CBC WITH DIFFERENTIAL/PLATELET
BASOS PCT: 0 %
Basophils Absolute: 0 10*3/uL (ref 0.0–0.1)
Eosinophils Absolute: 0.3 10*3/uL (ref 0.0–0.7)
Eosinophils Relative: 4 %
HCT: 43.4 % (ref 39.0–52.0)
Hemoglobin: 15.1 g/dL (ref 13.0–17.0)
Lymphocytes Relative: 17 %
Lymphs Abs: 1.2 10*3/uL (ref 0.7–4.0)
MCH: 33 pg (ref 26.0–34.0)
MCHC: 34.8 g/dL (ref 30.0–36.0)
MCV: 94.8 fL (ref 78.0–100.0)
Monocytes Absolute: 0.5 10*3/uL (ref 0.1–1.0)
Monocytes Relative: 7 %
Neutro Abs: 5.1 10*3/uL (ref 1.7–7.7)
Neutrophils Relative %: 72 %
Platelets: 103 10*3/uL — ABNORMAL LOW (ref 150–400)
RBC: 4.58 MIL/uL (ref 4.22–5.81)
RDW: 13.3 % (ref 11.5–15.5)
WBC: 7.1 10*3/uL (ref 4.0–10.5)

## 2017-02-01 LAB — URINALYSIS, ROUTINE W REFLEX MICROSCOPIC
BACTERIA UA: NONE SEEN
BILIRUBIN URINE: NEGATIVE
Glucose, UA: 150 mg/dL — AB
HGB URINE DIPSTICK: NEGATIVE
Ketones, ur: NEGATIVE mg/dL
LEUKOCYTES UA: NEGATIVE
Nitrite: NEGATIVE
Protein, ur: >=300 mg/dL — AB
Specific Gravity, Urine: 1.017 (ref 1.005–1.030)
pH: 6 (ref 5.0–8.0)

## 2017-02-01 LAB — COMPREHENSIVE METABOLIC PANEL
ALK PHOS: 73 U/L (ref 38–126)
ALT: 39 U/L (ref 17–63)
ANION GAP: 7 (ref 5–15)
AST: 53 U/L — ABNORMAL HIGH (ref 15–41)
Albumin: 3.6 g/dL (ref 3.5–5.0)
BILIRUBIN TOTAL: 0.9 mg/dL (ref 0.3–1.2)
BUN: 20 mg/dL (ref 6–20)
CALCIUM: 9 mg/dL (ref 8.9–10.3)
CO2: 26 mmol/L (ref 22–32)
Chloride: 108 mmol/L (ref 101–111)
Creatinine, Ser: 1.32 mg/dL — ABNORMAL HIGH (ref 0.61–1.24)
GFR calc Af Amer: >60 mL/min (ref >60)
GFR, EST NON AFRICAN AMERICAN: 52 mL/min — AB (ref >60)
Glucose, Bld: 206 mg/dL — ABNORMAL HIGH (ref 65–99)
Potassium: 4.1 mmol/L (ref 3.5–5.1)
Sodium: 141 mmol/L (ref 135–145)
Total Protein: 6 g/dL — ABNORMAL LOW (ref 6.5–8.1)

## 2017-02-01 LAB — I-STAT TROPONIN, ED: Troponin i, poc: 0 ng/mL (ref 0.00–0.08)

## 2017-02-01 LAB — I-STAT CG4 LACTIC ACID, ED: LACTIC ACID, VENOUS: 1.47 mmol/L (ref 0.5–1.9)

## 2017-02-01 MED ORDER — SODIUM CHLORIDE 0.9 % IV BOLUS (SEPSIS)
1000.0000 mL | Freq: Once | INTRAVENOUS | Status: AC
  Administered 2017-02-01: 1000 mL via INTRAVENOUS

## 2017-02-01 MED ORDER — HYDRALAZINE HCL 25 MG PO TABS
50.0000 mg | ORAL_TABLET | Freq: Three times a day (TID) | ORAL | Status: DC
  Administered 2017-02-01: 50 mg via ORAL
  Filled 2017-02-01: qty 2

## 2017-02-01 MED ORDER — HYDRALAZINE HCL 20 MG/ML IJ SOLN: 10.0000 mg | Freq: Once | INTRAMUSCULAR | Status: DC

## 2017-02-01 MED ORDER — CARVEDILOL 12.5 MG PO TABS
12.5000 mg | ORAL_TABLET | Freq: Two times a day (BID) | ORAL | Status: DC
  Administered 2017-02-01: 12.5 mg via ORAL
  Filled 2017-02-01: qty 1

## 2017-02-01 MED ORDER — HYDROCHLOROTHIAZIDE 25 MG PO TABS: 25.0000 mg | ORAL_TABLET | Freq: Every day | ORAL | 0 refills | Status: DC

## 2017-02-01 NOTE — ED Triage Notes (Signed)
Pt arrives from home via [place] reporting generalized weakness and dizziness x 4 weeks, reports unable to stand today. Pt reports some SOB, denies CP, LOC, head trauma.  Pt's spouse reports slurred speech onset last night at 2200, reports still slurred this am, resolved at this time. No focal deficits noted at this time. Pt AOx4.

## 2017-02-01 NOTE — ED Notes (Signed)
ED Provider at bedside. 

## 2017-02-01 NOTE — ED Provider Notes (Signed)
[facility] DEPT Provider Note   CSN: [id number] Arrival date & time: 02/01/17  9449     History   Chief Complaint Chief Complaint  Patient presents with  . Weakness    HPI Vernon Briggs is a 71 y.o. male.  Patient is accompanied with his wife. Per patient report, he has been evaluated at the [place] 84 times in the last one calendar year. He reports the last couple weeks she's had generalized weakness and fatigue, and some intermittent slow slurred speech. Getting progressively worse. Today had a mechanical fall and had profound weakness so was unable to get up to ambulate. Denies any fevers or chills. Denies chest pain or abdominal pain. Does endorse some mild diarrhea. No blood in the stool. No changes in medications. No other infectious symptoms.   The history is provided by the patient, a relative and medical records.  Illness  This is a new problem. Episode onset: 2 weeks. The problem occurs constantly. The problem has been gradually worsening. Pertinent negatives include no chest pain, no abdominal pain, no headaches and no shortness of breath. He has tried nothing for the symptoms.    Past Medical History:  Diagnosis Date  . AICD (automatic cardioverter/defibrillator) present   . Anxiety   . Arthritis    "occasionally in my right hand" (04/02/2015)  . CAD in native artery 1999  . Cancer ([place])   . CHF (congestive heart failure) ([place])   . Depression   . GERD (gastroesophageal reflux disease)    "occasionally" (04/02/2015)  . History of hiatal hernia   . Hypercholesterolemia   . Hypertension   . Myocardial infarction ([place]) "several"  . OSA on CPAP   . PAF (paroxysmal atrial fibrillation) ([place])   . PTSD (post-traumatic stress disorder)   . Stroke ([place])    "I've had 2; my right leg & arm slightly weaker since" (04/02/2015)  . Type II diabetes mellitus [facility])     Patient Active Problem List   Diagnosis Date Noted  . Chest pain 04/02/2015  . Acute renal failure ([place])  04/02/2015  . Weakness generalized 04/02/2015  . Cardiomyopathy, ischemic 09/15/2014  . CAD S/P CFX DES 09/14/14 09/15/2014  . PAF (paroxysmal atrial fibrillation) ([place]) 09/15/2014  . Chronic anticoagulation-Coumadin 09/15/2014  . S/P CABG x 12-1997   . Elevated troponin   . Abnormal nuclear stress test 09/11/2014    Class: Diagnosis of  . Acute coronary syndrome ([place]) 09/09/2014  . Hypertensive urgency 09/09/2014  . Essential hypertension   . History of CVA (cerebrovascular accident) 10/20/2012  . TIA (transient ischemic attack) 10/20/2012  . Automatic implantable cardioverter-defibrillator in situ 10/20/2012  . Type II diabetes mellitus with complication ([place]) 67/59/1638  . Hyperlipidemia with target LDL less than 70 10/20/2012  . Thrombocytopenia-plts 90-70K 10/20/2012    Past Surgical History:  Procedure Laterality Date  . CARDIAC DEFIBRILLATOR PLACEMENT     Biotronik  . CORONARY ANGIOPLASTY WITH STENT PLACEMENT     "I've got 7 stents total" (04/02/2015)  . CORONARY ARTERY BYPASS GRAFT  1999   CABG X4; LIMA-LAD, SVG-OM1, SVG-RI, SVG-dRCA  . LEFT HEART CATHETERIZATION WITH CORONARY/GRAFT ANGIOGRAM N/A 09/14/2014   Procedure: LEFT HEART CATHETERIZATION WITH Beatrix Fetters;  Surgeon: Leonie Man, MD;  Location: [facility] CATH LAB;  Service: Cardiovascular;  Laterality: N/A;  . PERCUTANEOUS CORONARY STENT INTERVENTION (PCI-S) Left 09/14/2014   Procedure: PERCUTANEOUS CORONARY STENT INTERVENTION (PCI-S);  Surgeon: Leonie Man, MD;  Location: [facility] CATH LAB;  Service: Cardiovascular;  Laterality: Left;  Home Medications    Prior to Admission medications   Medication Sig Start Date End Date Taking? Authorizing Provider  carvedilol (COREG) 3.125 MG tablet Take 3.125 mg by mouth 2 (two) times daily with a meal.    Yes [provider]  cholecalciferol (VITAMIN D) 1000 units tablet Take 1,000 Units by mouth daily.   Yes [provider]  clopidogrel  (PLAVIX) 75 MG tablet Take 75 mg by mouth daily.   Yes [provider]  donepezil (ARICEPT) 10 MG tablet Take 5 mg by mouth at bedtime.   Yes [provider]  FLUoxetine (PROZAC) 20 MG capsule Take 40 mg by mouth at bedtime.    Yes [provider]  insulin aspart protamine- aspart (NOVOLOG MIX 70/30) (70-30) 100 UNIT/ML injection Inject 54-94 Units into the skin 2 (two) times daily with a meal. Take 94 units in the morning 30 minutes before breakfast, and 54 units 30 minutes before dinner   Yes [provider]  ketoconazole (NIZORAL) 2 % cream Apply 1 application topically daily as needed for irritation.   Yes [provider]  lisinopril (PRINIVIL,ZESTRIL) 40 MG tablet Take 1 tablet (40 mg total) by mouth at bedtime. Patient taking differently: Take 40 mg by mouth every morning.  09/15/14  Yes Kilroy, Luke K, PA-C  loperamide (IMODIUM A-D) 2 MG tablet Take 2 mg by mouth daily.    Yes [provider]  magnesium oxide (MAG-OX) 400 MG tablet Take 400 mg by mouth 2 (two) times daily.   Yes [provider]  multivitamin-iron-minerals-folic acid (CENTRUM) chewable tablet Chew 1 tablet by mouth daily.   Yes [provider]  rosuvastatin (CRESTOR) 20 MG tablet Take 10 mg by mouth at bedtime.    Yes [provider]  torsemide (DEMADEX) 20 MG tablet Take 20 mg by mouth daily.    Yes [provider]  warfarin (COUMADIN) 5 MG tablet Take 5-7.5 mg by mouth at bedtime. Take 1 tablet (5mg ) all days except on Mon and Fri take 1 1/2 tab (7.5mg )   Yes [provider]  acetaminophen (TYLENOL) 325 MG tablet Take 2 tablets (650 mg total) by mouth every 4 (four) hours as needed for headache or mild pain. 09/15/14   Erlene Quan, PA-C  hydrALAZINE (APRESOLINE) 50 MG tablet Take 1 tablet (50 mg total) by mouth 3 (three) times daily. Patient not taking: Reported on 02/01/2017 09/15/14   Erlene Quan, PA-C  hydroxypropyl  methylcellulose / hypromellose (ISOPTO TEARS / GONIOVISC) 2.5 % ophthalmic solution Place 1 drop into both eyes 2 (two) times daily as needed for dry eyes.     [provider]  nitroGLYCERIN (NITROSTAT) 0.4 MG SL tablet Place 1 tablet (0.4 mg total) under the tongue every 5 (five) minutes x 3 doses as needed for chest pain. 09/15/14   Erlene Quan, PA-C    Family History No family history on file.  Social History Social History  Substance Use Topics  . Smoking status: Former Smoker    Packs/day: 0.50    Years: 1.00    Types: Cigarettes  . Smokeless tobacco: Never Used     Comment: "quit smoking cigarettes in the 1960's"  . Alcohol use Yes     Comment: 04/02/2015 "might have a beer and a mixed drink/month"     Allergies   Patient has no known allergies.   Review of Systems Review of Systems  Constitutional: Positive for fatigue. Negative for activity change, appetite change,  chills and fever.  Eyes: Negative for visual disturbance.  Respiratory: Negative for cough and shortness of breath.   Cardiovascular: Negative for chest pain, palpitations and leg swelling.       +Lightheadedness  Gastrointestinal: Positive for diarrhea. Negative for abdominal pain, blood in stool, nausea and vomiting.  Genitourinary: Negative for dysuria and frequency.  Neurological: Positive for speech difficulty (slow, slurring of speech) and weakness (generalized, as well as chronic RLE weakness from prior stroke). Negative for syncope, numbness and headaches.       No vertigo  All other systems reviewed and are negative.    Physical Exam Updated Vital Signs BP (!) 185/81   Pulse 64   Temp 97.7 F (36.5 C) (Oral)   Resp 20   Ht 6' (1.829 m)   Wt 122.5 kg (270 lb)   SpO2 97%   BMI 36.62 kg/m   Physical Exam  Constitutional: He appears well-developed and well-nourished.  HENT:  Head: Normocephalic and atraumatic.  Mouth/Throat: Mucous membranes are dry.  Eyes: Conjunctivae are  normal.  Neck: Neck supple.  Cardiovascular: Normal rate and regular rhythm.   No murmur heard. Pulmonary/Chest: Effort normal and breath sounds normal. No respiratory distress. He has no wheezes. He has no rhonchi.  Abdominal: Soft. There is no tenderness. There is no rebound and no guarding.  Musculoskeletal: He exhibits no edema.       Right lower leg: He exhibits no swelling and no edema.       Left lower leg: He exhibits no swelling and no edema.  Neurological: He is alert. No cranial nerve deficit or sensory deficit. GCS eye subscore is 4. GCS verbal subscore is 5. GCS motor subscore is 6.  4-5 strength in right lower extremity, reported to be consistent with baseline.  Skin: Skin is warm and dry.  Psychiatric: He has a normal mood and affect.  Nursing note and vitals reviewed.    ED Treatments / Results  Labs (all labs ordered are listed, but only abnormal results are displayed) Labs Reviewed  CBC WITH DIFFERENTIAL/PLATELET - Abnormal; Notable for the following:       Result Value   Platelets 103 (*)    All other components within normal limits  COMPREHENSIVE METABOLIC PANEL - Abnormal; Notable for the following:    Glucose, Bld 206 (*)    Creatinine, Ser 1.32 (*)    Total Protein 6.0 (*)    AST 53 (*)    GFR calc non Af Amer 52 (*)    All other components within normal limits  URINALYSIS, ROUTINE W REFLEX MICROSCOPIC  CBG MONITORING, ED  I-STAT CG4 LACTIC ACID, ED  I-STAT TROPOININ, ED    EKG  EKG Interpretation  Date/Time:  Thursday Feb 01 2017 08:18:20 EDT Ventricular Rate:  64 PR Interval:    QRS Duration: 99 QT Interval:  432 QTC Calculation: 446 R Axis:   -5 Text Interpretation:  Sinus bradycardia with 1st degree AV block Inferior infarct, old Consider anterior infarct Baseline wander in lead(s) V3 V4 Confirmed by [facility] MD, ERIN ([id number]) on 02/01/2017 9:50:40 AM       Radiology Ct Head Wo Contrast  Result Date: 02/01/2017 CLINICAL DATA:   Generalized weakness, dizziness. EXAM: CT HEAD WITHOUT CONTRAST TECHNIQUE: Contiguous axial images were obtained from the base of the skull through the vertex without intravenous contrast. COMPARISON:  CT scan of October 20, 2012. FINDINGS: Brain: Mild chronic ischemic white matter disease is noted. Old left frontal infarction is noted.  No mass effect or midline shift is noted. Ventricular size is within normal limits. There is no evidence of mass lesion, hemorrhage or acute infarction. Vascular: No hyperdense vessel or unexpected calcification. Skull: Normal. Negative for fracture or focal lesion. Sinuses/Orbits: No acute finding. Other: None. IMPRESSION: Mild chronic ischemic white matter disease. Old left frontal infarction. No acute intracranial abnormality seen. Electronically Signed   By: Marijo Conception, M.D.   On: 02/01/2017 09:29    Procedures Procedures (including critical care time)  Medications Ordered in ED Medications  carvedilol (COREG) tablet 12.5 mg (12.5 mg Oral Given 02/01/17 1032)  hydrALAZINE (APRESOLINE) tablet 50 mg (50 mg Oral Given 02/01/17 1032)  sodium chloride 0.9 % bolus 1,000 mL (1,000 mLs Intravenous New Bag/Given 02/01/17 1004)     Initial Impression / Assessment and Plan / ED Course  I have reviewed the triage vital signs and the nursing notes.  Pertinent labs & imaging results that were available during my care of the patient were reviewed by me and considered in my medical decision making (see chart for details).     On arrival here, the patient is well-appearing in no acute distress. Vital signs are normal and stable except for hypertension with systolic blood pressure in the 190s. Patient reports that he did not take his home blood pressure medications this morning. Giving them here. We will also give him a dose of IV hydralazine for immediate effect.  Patient has no focal neurological deficits on exam, except for the right lower extremity weakness that is  consistent with his baseline and his prior CVA. He is speaking slowly. Is not dysarthric. CT of his head without any evidence of new pathology such as bleed or new ischemic change. Doubt new CVA.  Labs here are reassuring. Electro lites within normal limits. No evidence of acute kidney injury. No leukocytosis. No anemia.  EKG here is consistent with prior. Large Q waves in the inferior leads. No other new ischemic changes or interval abnormalities. Troponin negative. Doubt ACS.  Intermittent diarrhea for the last 2 years. Denies any blood in the stool. Takes Imodium on a regular basis for this. Side effects for long-term Imodium use can be consistent with some of the symptoms patient is having.  Told the patient to try to wean off of the Imodium, but to not stop it abruptly given side effects of withdrawal from the medication after long-term use. Told patient to discuss this with his GI physician, who he plans on following up with within the next 1 week.  Patient reports improvement in symptoms after IV fluids and his home blood pressure medications here. Told him to follow up with his primary care doctor. Put in a referral for the patient to have home health visit him at home for physical therapy and nursing.  Final Clinical Impressions(s) / ED Diagnoses   Final diagnoses:  Generalized weakness    New Prescriptions New Prescriptions   No medications on file     Maryan Puls, MD 02/01/17 4034    Gareth Morgan, MD 02/05/17 [phone]

## 2017-02-01 NOTE — ED Notes (Signed)
Family at bedside. 

## 2017-02-01 NOTE — Discharge Instructions (Signed)
Try to wean yourself off of the loperamide, but do not stop it abruptly as this can cause more side effects. Follow-up with your GI doctor. Follow-up with your primary care doctor. He will be contacted by home health to help set up in-home nursing care.

## 2019-11-19 ENCOUNTER — Encounter (HOSPITAL_COMMUNITY): Payer: Self-pay

## 2019-11-19 ENCOUNTER — Other Ambulatory Visit: Payer: Self-pay

## 2019-11-19 ENCOUNTER — Emergency Department (HOSPITAL_COMMUNITY): Payer: No Typology Code available for payment source

## 2019-11-19 ENCOUNTER — Inpatient Hospital Stay (HOSPITAL_COMMUNITY)
Admission: EM | Admit: 2019-11-19 | Discharge: 2019-11-26 | DRG: 247 | Disposition: A | Discharge status: Home Health | Payer: No Typology Code available for payment source | Attending: Internal Medicine | Admitting: Internal Medicine

## 2019-11-19 DIAGNOSIS — Z66 Do not resuscitate: Secondary | ICD-10-CM | POA: Diagnosis present

## 2019-11-19 DIAGNOSIS — I214 Non-ST elevation (NSTEMI) myocardial infarction: Secondary | ICD-10-CM | POA: Diagnosis not present

## 2019-11-19 DIAGNOSIS — Z955 Presence of coronary angioplasty implant and graft: Secondary | ICD-10-CM

## 2019-11-19 DIAGNOSIS — F329 Major depressive disorder, single episode, unspecified: Secondary | ICD-10-CM | POA: Diagnosis present

## 2019-11-19 DIAGNOSIS — Z7901 Long term (current) use of anticoagulants: Secondary | ICD-10-CM

## 2019-11-19 DIAGNOSIS — Z87891 Personal history of nicotine dependence: Secondary | ICD-10-CM

## 2019-11-19 DIAGNOSIS — E1122 Type 2 diabetes mellitus with diabetic chronic kidney disease: Secondary | ICD-10-CM | POA: Diagnosis present

## 2019-11-19 DIAGNOSIS — I5021 Acute systolic (congestive) heart failure: Secondary | ICD-10-CM

## 2019-11-19 DIAGNOSIS — N1832 Chronic kidney disease, stage 3b: Secondary | ICD-10-CM | POA: Diagnosis present

## 2019-11-19 DIAGNOSIS — I2510 Atherosclerotic heart disease of native coronary artery without angina pectoris: Secondary | ICD-10-CM

## 2019-11-19 DIAGNOSIS — I25110 Atherosclerotic heart disease of native coronary artery with unstable angina pectoris: Secondary | ICD-10-CM | POA: Diagnosis present

## 2019-11-19 DIAGNOSIS — N182 Chronic kidney disease, stage 2 (mild): Secondary | ICD-10-CM | POA: Diagnosis present

## 2019-11-19 DIAGNOSIS — Z8673 Personal history of transient ischemic attack (TIA), and cerebral infarction without residual deficits: Secondary | ICD-10-CM

## 2019-11-19 DIAGNOSIS — F419 Anxiety disorder, unspecified: Secondary | ICD-10-CM | POA: Diagnosis present

## 2019-11-19 DIAGNOSIS — Z91048 Other nonmedicinal substance allergy status: Secondary | ICD-10-CM

## 2019-11-19 DIAGNOSIS — I252 Old myocardial infarction: Secondary | ICD-10-CM

## 2019-11-19 DIAGNOSIS — I480 Paroxysmal atrial fibrillation: Secondary | ICD-10-CM | POA: Diagnosis present

## 2019-11-19 DIAGNOSIS — T82858A Stenosis of vascular prosthetic devices, implants and grafts, initial encounter: Secondary | ICD-10-CM | POA: Diagnosis present

## 2019-11-19 DIAGNOSIS — I10 Essential (primary) hypertension: Secondary | ICD-10-CM | POA: Diagnosis present

## 2019-11-19 DIAGNOSIS — I25810 Atherosclerosis of coronary artery bypass graft(s) without angina pectoris: Secondary | ICD-10-CM | POA: Diagnosis present

## 2019-11-19 DIAGNOSIS — R079 Chest pain, unspecified: Secondary | ICD-10-CM | POA: Diagnosis present

## 2019-11-19 DIAGNOSIS — I200 Unstable angina: Secondary | ICD-10-CM | POA: Diagnosis not present

## 2019-11-19 DIAGNOSIS — I255 Ischemic cardiomyopathy: Secondary | ICD-10-CM | POA: Diagnosis present

## 2019-11-19 DIAGNOSIS — Z20822 Contact with and (suspected) exposure to covid-19: Secondary | ICD-10-CM | POA: Diagnosis present

## 2019-11-19 DIAGNOSIS — Z951 Presence of aortocoronary bypass graft: Secondary | ICD-10-CM

## 2019-11-19 DIAGNOSIS — Z6839 Body mass index (BMI) 39.0-39.9, adult: Secondary | ICD-10-CM

## 2019-11-19 DIAGNOSIS — Z79899 Other long term (current) drug therapy: Secondary | ICD-10-CM

## 2019-11-19 DIAGNOSIS — I130 Hypertensive heart and chronic kidney disease with heart failure and stage 1 through stage 4 chronic kidney disease, or unspecified chronic kidney disease: Secondary | ICD-10-CM | POA: Diagnosis present

## 2019-11-19 DIAGNOSIS — E118 Type 2 diabetes mellitus with unspecified complications: Secondary | ICD-10-CM | POA: Diagnosis present

## 2019-11-19 DIAGNOSIS — Z95810 Presence of automatic (implantable) cardiac defibrillator: Secondary | ICD-10-CM

## 2019-11-19 DIAGNOSIS — Z7902 Long term (current) use of antithrombotics/antiplatelets: Secondary | ICD-10-CM

## 2019-11-19 DIAGNOSIS — I5042 Chronic combined systolic (congestive) and diastolic (congestive) heart failure: Secondary | ICD-10-CM | POA: Diagnosis present

## 2019-11-19 DIAGNOSIS — I440 Atrioventricular block, first degree: Secondary | ICD-10-CM | POA: Diagnosis present

## 2019-11-19 DIAGNOSIS — K219 Gastro-esophageal reflux disease without esophagitis: Secondary | ICD-10-CM | POA: Diagnosis present

## 2019-11-19 DIAGNOSIS — G4733 Obstructive sleep apnea (adult) (pediatric): Secondary | ICD-10-CM | POA: Diagnosis present

## 2019-11-19 DIAGNOSIS — F4310 Post-traumatic stress disorder, unspecified: Secondary | ICD-10-CM | POA: Diagnosis present

## 2019-11-19 DIAGNOSIS — E785 Hyperlipidemia, unspecified: Secondary | ICD-10-CM | POA: Diagnosis present

## 2019-11-19 DIAGNOSIS — D696 Thrombocytopenia, unspecified: Secondary | ICD-10-CM | POA: Diagnosis present

## 2019-11-19 DIAGNOSIS — Z794 Long term (current) use of insulin: Secondary | ICD-10-CM

## 2019-11-19 DIAGNOSIS — E669 Obesity, unspecified: Secondary | ICD-10-CM | POA: Diagnosis present

## 2019-11-19 DIAGNOSIS — M1990 Unspecified osteoarthritis, unspecified site: Secondary | ICD-10-CM | POA: Diagnosis present

## 2019-11-19 LAB — CBC
HCT: 40.2 % (ref 39.0–52.0)
Hemoglobin: 13.5 g/dL (ref 13.0–17.0)
MCH: 33.8 pg (ref 26.0–34.0)
MCHC: 33.6 g/dL (ref 30.0–36.0)
MCV: 100.8 fL — ABNORMAL HIGH (ref 80.0–100.0)
Platelets: 97 10*3/uL — ABNORMAL LOW (ref 150–400)
RBC: 3.99 MIL/uL — ABNORMAL LOW (ref 4.22–5.81)
RDW: 14.1 % (ref 11.5–15.5)
WBC: 7.4 10*3/uL (ref 4.0–10.5)
nRBC: 0 % (ref 0.0–0.2)

## 2019-11-19 LAB — BASIC METABOLIC PANEL
Anion gap: 12 (ref 5–15)
BUN: 49 mg/dL — ABNORMAL HIGH (ref 8–23)
CO2: 23 mmol/L (ref 22–32)
Calcium: 9 mg/dL (ref 8.9–10.3)
Chloride: 105 mmol/L (ref 98–111)
Creatinine, Ser: 1.84 mg/dL — ABNORMAL HIGH (ref 0.61–1.24)
GFR calc Af Amer: 41 mL/min — ABNORMAL LOW (ref >60)
GFR calc non Af Amer: 35 mL/min — ABNORMAL LOW (ref >60)
Glucose, Bld: 260 mg/dL — ABNORMAL HIGH (ref 70–99)
Potassium: 5.2 mmol/L — ABNORMAL HIGH (ref 3.5–5.1)
Sodium: 140 mmol/L (ref 135–145)

## 2019-11-19 LAB — PROTIME-INR
INR: 1.3 — ABNORMAL HIGH (ref 0.8–1.2)
Prothrombin Time: 16.4 seconds — ABNORMAL HIGH (ref 11.4–15.2)

## 2019-11-19 LAB — TROPONIN I (HIGH SENSITIVITY): Troponin I (High Sensitivity): 55 ng/L — ABNORMAL HIGH (ref <18)

## 2019-11-19 NOTE — ED Triage Notes (Signed)
Pt BIB [facility] for CC of chest pain/SHOB. Pt complains of chest tightness upon breathing, feels unable to get full breath. Significant cardiac hx, MI, stents, internal defibrillator, CHF. Pt on PRN CPAP upon EMS arrival, was slow to respond. Pt on 4L [place] upon arrival, O2 sats 98% EMS applied 2in nitro paste to L chest, pt says pain is 0/10 currently

## 2019-11-19 NOTE — ED Notes (Signed)
Vernon Briggs, wife, cell # [phone]

## 2019-11-19 NOTE — ED Provider Notes (Signed)
[facility] EMERGENCY DEPARTMENT Provider Note   CSN: [id number] Arrival date & time: 11/19/19  2151     History Chief Complaint  Patient presents with  . Shortness of Breath  . Hypertension  . Chest Pain    Vernon Briggs is a 74 y.o. male.  Pt presents to the ED today with cp and sob.  Pt has a hx of CAD and CHF.  He has a AICD in place.  He has not felt it fire.  Pt denies any f/c.  No known covid exposures.  He has not had the Covid vaccine.  Pt has CP daily for which he takes Imdur.  Tonight was much worse and was relieved by the nitro that EMS gave him.  No cp now, but he does feel sob.        Past Medical History:  Diagnosis Date  . AICD (automatic cardioverter/defibrillator) present   . Anxiety   . Arthritis    "occasionally in my right hand" (04/02/2015)  . CAD in native artery 1999  . Cancer ([place])   . CHF (congestive heart failure) ([place])   . Depression   . GERD (gastroesophageal reflux disease)    "occasionally" (04/02/2015)  . History of hiatal hernia   . Hypercholesterolemia   . Hypertension   . Myocardial infarction ([place]) "several"  . OSA on CPAP   . PAF (paroxysmal atrial fibrillation) ([place])   . PTSD (post-traumatic stress disorder)   . Stroke ([place])    "I've had 2; my right leg & arm slightly weaker since" (04/02/2015)  . Type II diabetes mellitus [facility])     Patient Active Problem List   Diagnosis Date Noted  . Chest pain 04/02/2015  . Acute renal failure ([place]) 04/02/2015  . Weakness generalized 04/02/2015  . Cardiomyopathy, ischemic 09/15/2014  . CAD S/P CFX DES 09/14/14 09/15/2014  . PAF (paroxysmal atrial fibrillation) ([place]) 09/15/2014  . Chronic anticoagulation-Coumadin 09/15/2014  . S/P CABG x 12-1997   . Elevated troponin   . Abnormal nuclear stress test 09/11/2014    Class: Diagnosis of  . Acute coronary syndrome ([place]) 09/09/2014  . Hypertensive urgency 09/09/2014  . Essential hypertension   . History of CVA  (cerebrovascular accident) 10/20/2012  . TIA (transient ischemic attack) 10/20/2012  . Automatic implantable cardioverter-defibrillator in situ 10/20/2012  . Type II diabetes mellitus with complication ([place]) [id number]  . Hyperlipidemia with target LDL less than 70 10/20/2012  . Thrombocytopenia-plts 90-70K 10/20/2012    Past Surgical History:  Procedure Laterality Date  . CARDIAC DEFIBRILLATOR PLACEMENT     Biotronik  . CORONARY ANGIOPLASTY WITH STENT PLACEMENT     "I've got 7 stents total" (04/02/2015)  . CORONARY ARTERY BYPASS GRAFT  1999   CABG X4; LIMA-LAD, SVG-OM1, SVG-RI, SVG-dRCA  . LEFT HEART CATHETERIZATION WITH CORONARY/GRAFT ANGIOGRAM N/A 09/14/2014   Procedure: LEFT HEART CATHETERIZATION WITH Beatrix Fetters;  Surgeon: Leonie Man, MD;  Location: [facility] CATH LAB;  Service: Cardiovascular;  Laterality: N/A;  . PERCUTANEOUS CORONARY STENT INTERVENTION (PCI-S) Left 09/14/2014   Procedure: PERCUTANEOUS CORONARY STENT INTERVENTION (PCI-S);  Surgeon: Leonie Man, MD;  Location: [facility] CATH LAB;  Service: Cardiovascular;  Laterality: Left;       History reviewed. No pertinent family history.  Social History   Tobacco Use  . Smoking status: Former Smoker    Packs/day: 0.50    Years: 1.00    Pack years: 0.50    Types: Cigarettes  . Smokeless tobacco: Never Used  .  Tobacco comment: "quit smoking cigarettes in the 1960's"  Substance Use Topics  . Alcohol use: Yes    Comment: 04/02/2015 "might have a beer and a mixed drink/month"  . Drug use: No    Home Medications Prior to Admission medications   Medication Sig Start Date End Date Taking? Authorizing Provider  acetaminophen (TYLENOL) 325 MG tablet Take 2 tablets (650 mg total) by mouth every 4 (four) hours as needed for headache or mild pain. 09/15/14   Erlene Quan, PA-C  carvedilol (COREG) 3.125 MG tablet Take 3.125 mg by mouth 2 (two) times daily with a meal.     [provider]  cholecalciferol  (VITAMIN D) 1000 units tablet Take 1,000 Units by mouth daily.    [provider]  clopidogrel (PLAVIX) 75 MG tablet Take 75 mg by mouth daily.    [provider]  donepezil (ARICEPT) 10 MG tablet Take 5 mg by mouth at bedtime.    [provider]  FLUoxetine (PROZAC) 20 MG capsule Take 40 mg by mouth at bedtime.     [provider]  hydrALAZINE (APRESOLINE) 50 MG tablet Take 1 tablet (50 mg total) by mouth 3 (three) times daily. Patient not taking: Reported on 02/01/2017 09/15/14   Erlene Quan, PA-C  hydrochlorothiazide (HYDRODIURIL) 25 MG tablet Take 1 tablet (25 mg total) by mouth daily. 02/01/17 03/03/17  Maryan Puls, MD  hydroxypropyl methylcellulose / hypromellose (ISOPTO TEARS / GONIOVISC) 2.5 % ophthalmic solution Place 1 drop into both eyes 2 (two) times daily as needed for dry eyes.     [provider]  insulin aspart protamine- aspart (NOVOLOG MIX 70/30) (70-30) 100 UNIT/ML injection Inject 54-94 Units into the skin 2 (two) times daily with a meal. Take 94 units in the morning 30 minutes before breakfast, and 54 units 30 minutes before dinner    [provider]  ketoconazole (NIZORAL) 2 % cream Apply 1 application topically daily as needed for irritation.    [provider]  lisinopril (PRINIVIL,ZESTRIL) 40 MG tablet Take 1 tablet (40 mg total) by mouth at bedtime. Patient taking differently: Take 40 mg by mouth every morning.  09/15/14   Erlene Quan, PA-C  loperamide (IMODIUM A-D) 2 MG tablet Take 2 mg by mouth daily.     [provider]  magnesium oxide (MAG-OX) 400 MG tablet Take 400 mg by mouth 2 (two) times daily.    [provider]  multivitamin-iron-minerals-folic acid (CENTRUM) chewable tablet Chew 1 tablet by mouth daily.    [provider]  nitroGLYCERIN (NITROSTAT) 0.4 MG SL tablet Place 1 tablet (0.4 mg total) under the tongue every 5 (five) minutes x 3 doses as needed for chest pain.  09/15/14   Erlene Quan, PA-C  rosuvastatin (CRESTOR) 20 MG tablet Take 10 mg by mouth at bedtime.     [provider]  torsemide (DEMADEX) 20 MG tablet Take 20 mg by mouth daily.     [provider]  warfarin (COUMADIN) 5 MG tablet Take 5-7.5 mg by mouth at bedtime. Take 1 tablet (5mg ) all days except on Mon and Fri take 1 1/2 tab (7.5mg )    [provider]    Allergies    Patient has no known allergies.  Review of Systems   Review of Systems  Respiratory: Positive for shortness of breath.   Cardiovascular: Positive for chest pain.  All other systems reviewed and are negative.   Physical Exam Updated Vital Signs BP  133/64   Pulse 63   Temp 98.4 F (36.9 C) (Oral)   Resp 17   Ht 6' (1.829 m)   Wt 129.3 kg   SpO2 98%   BMI 38.65 kg/m   Physical Exam Vitals and nursing note reviewed.  Constitutional:      Appearance: He is well-developed.  HENT:     Head: Normocephalic and atraumatic.     Mouth/Throat:     Mouth: Mucous membranes are moist.     Pharynx: Oropharynx is clear.  Eyes:     Extraocular Movements: Extraocular movements intact.     Pupils: Pupils are equal, round, and reactive to light.  Cardiovascular:     Rate and Rhythm: Normal rate and regular rhythm.  Pulmonary:     Effort: Pulmonary effort is normal.     Breath sounds: Normal breath sounds.  Abdominal:     General: Bowel sounds are normal.     Palpations: Abdomen is soft.  Musculoskeletal:        General: Normal range of motion.     Cervical back: Normal range of motion and neck supple.  Skin:    General: Skin is warm.     Capillary Refill: Capillary refill takes less than 2 seconds.  Neurological:     General: No focal deficit present.     Mental Status: He is alert and oriented to person, place, and time.  Psychiatric:        Mood and Affect: Mood normal.        Behavior: Behavior normal.     ED Results / Procedures / Treatments   Labs (all labs ordered are  listed, but only abnormal results are displayed) Labs Reviewed  BASIC METABOLIC PANEL - Abnormal; Notable for the following components:      Result Value   Potassium 5.2 (*)    Glucose, Bld 260 (*)    BUN 49 (*)    Creatinine, Ser 1.84 (*)    GFR calc non Af Amer 35 (*)    GFR calc Af Amer 41 (*)    All other components within normal limits  CBC - Abnormal; Notable for the following components:   RBC 3.99 (*)    MCV 100.8 (*)    Platelets 97 (*)    All other components within normal limits  PROTIME-INR - Abnormal; Notable for the following components:   Prothrombin Time 16.4 (*)    INR 1.3 (*)    All other components within normal limits  TROPONIN I (HIGH SENSITIVITY) - Abnormal; Notable for the following components:   Troponin I (High Sensitivity) 55 (*)    All other components within normal limits  RESPIRATORY PANEL BY RT PCR (FLU A&B, COVID)    EKG EKG Interpretation  Date/Time:  Wednesday November 19 2019 21:56:35 EDT Ventricular Rate:  74 PR Interval:    QRS Duration: 172 QT Interval:  461 QTC Calculation: 512 R Axis:   -77 Text Interpretation: Sinus rhythm Nonspecific IVCD with LAD LVH with secondary repolarization abnormality new from prior Confirmed by Isla Pence [phone]) on 11/19/2019 10:08:41 PM   Radiology DG Chest 2 View  Result Date: 11/19/2019 CLINICAL DATA:  74 year old male with chest pain. EXAM: CHEST - 2 VIEW COMPARISON:  Chest radiograph dated 04/02/2015. FINDINGS: There is shallow inspiration. Left lung base linear densities, likely related to atelectatic changes and interstitial crowding. Asymmetric edema is less likely. Clinical correlation recommended. No focal consolidation, pleural effusion, or pneumothorax. Stable cardiomegaly. Median sternotomy wires and  left pectoral AICD device. No acute osseous pathology. IMPRESSION: Shallow inspiration.  No focal consolidation. Electronically Signed   By: Anner Crete M.D.   On: 11/19/2019 22:36     Procedures Procedures (including critical care time)  Medications Ordered in ED Medications - No data to display  ED Course  I have reviewed the triage vital signs and the nursing notes.  Pertinent labs & imaging results that were available during my care of the patient were reviewed by me and considered in my medical decision making (see chart for details).    MDM Rules/Calculators/A&P                     Pt is CP free after nitro.  Troponin minimally elevated.  EKG changes are new, but it's been several years.  Pt d/w Dr. Hal Hope (triad) for admission.  Final Clinical Impression(s) / ED Diagnoses Final diagnoses:  Unstable angina [facility])    Rx / DC Orders ED Discharge Orders    None       Isla Pence, MD 11/19/19 2335

## 2019-11-20 ENCOUNTER — Observation Stay (HOSPITAL_COMMUNITY): Payer: No Typology Code available for payment source

## 2019-11-20 ENCOUNTER — Encounter (HOSPITAL_COMMUNITY): Payer: Self-pay | Admitting: Internal Medicine

## 2019-11-20 DIAGNOSIS — E118 Type 2 diabetes mellitus with unspecified complications: Secondary | ICD-10-CM

## 2019-11-20 DIAGNOSIS — I361 Nonrheumatic tricuspid (valve) insufficiency: Secondary | ICD-10-CM

## 2019-11-20 DIAGNOSIS — Z95810 Presence of automatic (implantable) cardiac defibrillator: Secondary | ICD-10-CM | POA: Diagnosis not present

## 2019-11-20 DIAGNOSIS — I10 Essential (primary) hypertension: Secondary | ICD-10-CM | POA: Diagnosis not present

## 2019-11-20 DIAGNOSIS — K219 Gastro-esophageal reflux disease without esophagitis: Secondary | ICD-10-CM | POA: Diagnosis present

## 2019-11-20 DIAGNOSIS — F419 Anxiety disorder, unspecified: Secondary | ICD-10-CM | POA: Diagnosis present

## 2019-11-20 DIAGNOSIS — Z66 Do not resuscitate: Secondary | ICD-10-CM | POA: Diagnosis present

## 2019-11-20 DIAGNOSIS — Z9861 Coronary angioplasty status: Secondary | ICD-10-CM

## 2019-11-20 DIAGNOSIS — I340 Nonrheumatic mitral (valve) insufficiency: Secondary | ICD-10-CM

## 2019-11-20 DIAGNOSIS — I255 Ischemic cardiomyopathy: Secondary | ICD-10-CM | POA: Diagnosis present

## 2019-11-20 DIAGNOSIS — E669 Obesity, unspecified: Secondary | ICD-10-CM | POA: Diagnosis present

## 2019-11-20 DIAGNOSIS — D696 Thrombocytopenia, unspecified: Secondary | ICD-10-CM | POA: Diagnosis present

## 2019-11-20 DIAGNOSIS — I480 Paroxysmal atrial fibrillation: Secondary | ICD-10-CM | POA: Diagnosis present

## 2019-11-20 DIAGNOSIS — R079 Chest pain, unspecified: Secondary | ICD-10-CM

## 2019-11-20 DIAGNOSIS — N1832 Chronic kidney disease, stage 3b: Secondary | ICD-10-CM | POA: Diagnosis present

## 2019-11-20 DIAGNOSIS — Z8673 Personal history of transient ischemic attack (TIA), and cerebral infarction without residual deficits: Secondary | ICD-10-CM | POA: Diagnosis not present

## 2019-11-20 DIAGNOSIS — N182 Chronic kidney disease, stage 2 (mild): Secondary | ICD-10-CM | POA: Diagnosis present

## 2019-11-20 DIAGNOSIS — I5042 Chronic combined systolic (congestive) and diastolic (congestive) heart failure: Secondary | ICD-10-CM | POA: Diagnosis present

## 2019-11-20 DIAGNOSIS — I200 Unstable angina: Secondary | ICD-10-CM | POA: Diagnosis present

## 2019-11-20 DIAGNOSIS — I25810 Atherosclerosis of coronary artery bypass graft(s) without angina pectoris: Secondary | ICD-10-CM | POA: Diagnosis present

## 2019-11-20 DIAGNOSIS — T82858A Stenosis of vascular prosthetic devices, implants and grafts, initial encounter: Secondary | ICD-10-CM | POA: Diagnosis present

## 2019-11-20 DIAGNOSIS — F329 Major depressive disorder, single episode, unspecified: Secondary | ICD-10-CM | POA: Diagnosis present

## 2019-11-20 DIAGNOSIS — I130 Hypertensive heart and chronic kidney disease with heart failure and stage 1 through stage 4 chronic kidney disease, or unspecified chronic kidney disease: Secondary | ICD-10-CM | POA: Diagnosis present

## 2019-11-20 DIAGNOSIS — I440 Atrioventricular block, first degree: Secondary | ICD-10-CM | POA: Diagnosis present

## 2019-11-20 DIAGNOSIS — Z20822 Contact with and (suspected) exposure to covid-19: Secondary | ICD-10-CM | POA: Diagnosis present

## 2019-11-20 DIAGNOSIS — I2510 Atherosclerotic heart disease of native coronary artery without angina pectoris: Secondary | ICD-10-CM

## 2019-11-20 DIAGNOSIS — Z91048 Other nonmedicinal substance allergy status: Secondary | ICD-10-CM | POA: Diagnosis not present

## 2019-11-20 DIAGNOSIS — I214 Non-ST elevation (NSTEMI) myocardial infarction: Secondary | ICD-10-CM | POA: Diagnosis present

## 2019-11-20 DIAGNOSIS — Z6839 Body mass index (BMI) 39.0-39.9, adult: Secondary | ICD-10-CM | POA: Diagnosis not present

## 2019-11-20 DIAGNOSIS — E785 Hyperlipidemia, unspecified: Secondary | ICD-10-CM | POA: Diagnosis present

## 2019-11-20 DIAGNOSIS — Z7901 Long term (current) use of anticoagulants: Secondary | ICD-10-CM | POA: Diagnosis not present

## 2019-11-20 DIAGNOSIS — I25110 Atherosclerotic heart disease of native coronary artery with unstable angina pectoris: Secondary | ICD-10-CM | POA: Diagnosis present

## 2019-11-20 DIAGNOSIS — Z794 Long term (current) use of insulin: Secondary | ICD-10-CM | POA: Diagnosis not present

## 2019-11-20 DIAGNOSIS — E1122 Type 2 diabetes mellitus with diabetic chronic kidney disease: Secondary | ICD-10-CM | POA: Diagnosis present

## 2019-11-20 LAB — CBC WITH DIFFERENTIAL/PLATELET
Abs Immature Granulocytes: 0.02 10*3/uL (ref 0.00–0.07)
Basophils Absolute: 0 10*3/uL (ref 0.0–0.1)
Basophils Relative: 0 %
Eosinophils Absolute: 0.3 10*3/uL (ref 0.0–0.5)
Eosinophils Relative: 4 %
HCT: 37.8 % — ABNORMAL LOW (ref 39.0–52.0)
Hemoglobin: 12.6 g/dL — ABNORMAL LOW (ref 13.0–17.0)
Immature Granulocytes: 0 %
Lymphocytes Relative: 17 %
Lymphs Abs: 1.2 10*3/uL (ref 0.7–4.0)
MCH: 33.1 pg (ref 26.0–34.0)
MCHC: 33.3 g/dL (ref 30.0–36.0)
MCV: 99.2 fL (ref 80.0–100.0)
Monocytes Absolute: 0.6 10*3/uL (ref 0.1–1.0)
Monocytes Relative: 10 %
Neutro Abs: 4.6 10*3/uL (ref 1.7–7.7)
Neutrophils Relative %: 69 %
Platelets: 76 10*3/uL — ABNORMAL LOW (ref 150–400)
RBC: 3.81 MIL/uL — ABNORMAL LOW (ref 4.22–5.81)
RDW: 14.1 % (ref 11.5–15.5)
WBC: 6.7 10*3/uL (ref 4.0–10.5)
nRBC: 0 % (ref 0.0–0.2)

## 2019-11-20 LAB — ECHOCARDIOGRAM COMPLETE
Height: 72 in
Weight: 4560 oz

## 2019-11-20 LAB — D-DIMER, QUANTITATIVE: D-Dimer, Quant: 0.69 ug/mL-FEU — ABNORMAL HIGH (ref 0.00–0.50)

## 2019-11-20 LAB — PROTIME-INR
INR: 1.5 — ABNORMAL HIGH (ref 0.8–1.2)
Prothrombin Time: 18.1 seconds — ABNORMAL HIGH (ref 11.4–15.2)

## 2019-11-20 LAB — COMPREHENSIVE METABOLIC PANEL
ALT: 21 U/L (ref 0–44)
AST: 27 U/L (ref 15–41)
Albumin: 3.5 g/dL (ref 3.5–5.0)
Alkaline Phosphatase: 77 U/L (ref 38–126)
Anion gap: 9 (ref 5–15)
BUN: 45 mg/dL — ABNORMAL HIGH (ref 8–23)
CO2: 28 mmol/L (ref 22–32)
Calcium: 8.8 mg/dL — ABNORMAL LOW (ref 8.9–10.3)
Chloride: 104 mmol/L (ref 98–111)
Creatinine, Ser: 1.83 mg/dL — ABNORMAL HIGH (ref 0.61–1.24)
GFR calc Af Amer: 41 mL/min — ABNORMAL LOW (ref >60)
GFR calc non Af Amer: 35 mL/min — ABNORMAL LOW (ref >60)
Glucose, Bld: 211 mg/dL — ABNORMAL HIGH (ref 70–99)
Potassium: 4.4 mmol/L (ref 3.5–5.1)
Sodium: 141 mmol/L (ref 135–145)
Total Bilirubin: 0.9 mg/dL (ref 0.3–1.2)
Total Protein: 5.8 g/dL — ABNORMAL LOW (ref 6.5–8.1)

## 2019-11-20 LAB — TROPONIN I (HIGH SENSITIVITY)
Troponin I (High Sensitivity): 95 ng/L — ABNORMAL HIGH (ref <18)
Troponin I (High Sensitivity): 314 ng/L (ref <18)
Troponin I (High Sensitivity): 485 ng/L (ref <18)

## 2019-11-20 LAB — RESPIRATORY PANEL BY RT PCR (FLU A&B, COVID)
Influenza A by PCR: NEGATIVE
Influenza B by PCR: NEGATIVE
SARS Coronavirus 2 by RT PCR: NEGATIVE

## 2019-11-20 LAB — TSH: TSH: 2.402 u[IU]/mL (ref 0.350–4.500)

## 2019-11-20 LAB — HEMOGLOBIN A1C
Hgb A1c MFr Bld: 8 % — ABNORMAL HIGH (ref 4.8–5.6)
Mean Plasma Glucose: 182.9 mg/dL

## 2019-11-20 LAB — APTT: aPTT: 59 seconds — ABNORMAL HIGH (ref 24–36)

## 2019-11-20 LAB — GLUCOSE, CAPILLARY
Glucose-Capillary: 191 mg/dL — ABNORMAL HIGH (ref 70–99)
Glucose-Capillary: 284 mg/dL — ABNORMAL HIGH (ref 70–99)
Glucose-Capillary: 244 mg/dL — ABNORMAL HIGH (ref 70–99)
Glucose-Capillary: 325 mg/dL — ABNORMAL HIGH (ref 70–99)

## 2019-11-20 LAB — HEPARIN LEVEL (UNFRACTIONATED): Heparin Unfractionated: 1.4 IU/mL — ABNORMAL HIGH (ref 0.30–0.70)

## 2019-11-20 MED ORDER — ASPIRIN 81 MG PO CHEW: 324.0000 mg | CHEWABLE_TABLET | Freq: Once | ORAL | Status: DC | PRN

## 2019-11-20 MED ORDER — CLOPIDOGREL BISULFATE 75 MG PO TABS
75.0000 mg | ORAL_TABLET | Freq: Every day | ORAL | Status: DC
  Administered 2019-11-20 – 2019-11-26 (×7): 75 mg via ORAL
  Filled 2019-11-20 (×7): qty 1

## 2019-11-20 MED ORDER — ACETAMINOPHEN 650 MG RE SUPP: 650.0000 mg | Freq: Four times a day (QID) | RECTAL | Status: DC | PRN

## 2019-11-20 MED ORDER — FLUOXETINE HCL 20 MG PO CAPS
40.0000 mg | ORAL_CAPSULE | Freq: Every day | ORAL | Status: DC
  Administered 2019-11-21 – 2019-11-25 (×5): 40 mg via ORAL
  Filled 2019-11-20 (×5): qty 2

## 2019-11-20 MED ORDER — DONEPEZIL HCL 5 MG PO TABS
5.0000 mg | ORAL_TABLET | Freq: Every day | ORAL | Status: DC
  Administered 2019-11-21 – 2019-11-25 (×5): 5 mg via ORAL
  Filled 2019-11-20 (×5): qty 1

## 2019-11-20 MED ORDER — INSULIN ASPART 100 UNIT/ML ~~LOC~~ SOLN
0.0000 [IU] | Freq: Every day | SUBCUTANEOUS | Status: DC
  Administered 2019-11-20: 4 [IU] via SUBCUTANEOUS
  Administered 2019-11-21: 22:00:00 2 [IU] via SUBCUTANEOUS
  Administered 2019-11-22: 3 [IU] via SUBCUTANEOUS
  Administered 2019-11-24: 2 [IU] via SUBCUTANEOUS
  Administered 2019-11-24: 3 [IU] via SUBCUTANEOUS

## 2019-11-20 MED ORDER — ROSUVASTATIN CALCIUM 5 MG PO TABS
10.0000 mg | ORAL_TABLET | Freq: Every day | ORAL | Status: DC
  Administered 2019-11-20 (×2): 10 mg via ORAL
  Filled 2019-11-20 (×2): qty 2

## 2019-11-20 MED ORDER — ONDANSETRON HCL 4 MG/2ML IJ SOLN: 4.0000 mg | Freq: Four times a day (QID) | INTRAMUSCULAR | Status: DC | PRN

## 2019-11-20 MED ORDER — HYDRALAZINE HCL 20 MG/ML IJ SOLN: 10.0000 mg | INTRAMUSCULAR | Status: DC | PRN

## 2019-11-20 MED ORDER — CARVEDILOL 3.125 MG PO TABS
3.1250 mg | ORAL_TABLET | Freq: Two times a day (BID) | ORAL | Status: DC
  Administered 2019-11-20 – 2019-11-26 (×10): 3.125 mg via ORAL
  Filled 2019-11-20 (×15): qty 1

## 2019-11-20 MED ORDER — ACETAMINOPHEN 325 MG PO TABS: 650.0000 mg | ORAL_TABLET | Freq: Four times a day (QID) | ORAL | Status: DC | PRN

## 2019-11-20 MED ORDER — LISINOPRIL 40 MG PO TABS: 40.0000 mg | ORAL_TABLET | Freq: Every morning | ORAL | Status: DC

## 2019-11-20 MED ORDER — INSULIN ASPART 100 UNIT/ML ~~LOC~~ SOLN
0.0000 [IU] | Freq: Three times a day (TID) | SUBCUTANEOUS | Status: DC
  Administered 2019-11-20: 2 [IU] via SUBCUTANEOUS
  Administered 2019-11-20: 5 [IU] via SUBCUTANEOUS

## 2019-11-20 MED ORDER — INSULIN ASPART 100 UNIT/ML ~~LOC~~ SOLN
0.0000 [IU] | Freq: Three times a day (TID) | SUBCUTANEOUS | Status: DC
  Administered 2019-11-21: 07:00:00 15 [IU] via SUBCUTANEOUS
  Administered 2019-11-21: 11 [IU] via SUBCUTANEOUS
  Administered 2019-11-21: 7 [IU] via SUBCUTANEOUS
  Administered 2019-11-22 (×2): 11 [IU] via SUBCUTANEOUS
  Administered 2019-11-22: 15 [IU] via SUBCUTANEOUS
  Administered 2019-11-23: 7 [IU] via SUBCUTANEOUS
  Administered 2019-11-23 – 2019-11-25 (×4): 11 [IU] via SUBCUTANEOUS
  Administered 2019-11-25 – 2019-11-26 (×3): 7 [IU] via SUBCUTANEOUS
  Administered 2019-11-26: 11 [IU] via SUBCUTANEOUS

## 2019-11-20 MED ORDER — MAGNESIUM OXIDE 400 (241.3 MG) MG PO TABS
400.0000 mg | ORAL_TABLET | Freq: Two times a day (BID) | ORAL | Status: DC
  Administered 2019-11-20 – 2019-11-26 (×14): 400 mg via ORAL
  Filled 2019-11-20 (×15): qty 1

## 2019-11-20 MED ORDER — ONDANSETRON HCL 4 MG PO TABS: 4.0000 mg | ORAL_TABLET | Freq: Four times a day (QID) | ORAL | Status: DC | PRN

## 2019-11-20 MED ORDER — HEPARIN (PORCINE) 25000 UT/250ML-% IV SOLN
1500.0000 [IU]/h | INTRAVENOUS | Status: DC
  Administered 2019-11-20: 1400 [IU]/h via INTRAVENOUS

## 2019-11-20 MED ORDER — PERFLUTREN LIPID MICROSPHERE
1.0000 mL | INTRAVENOUS | Status: AC | PRN
  Administered 2019-11-20: 11:00:00 3 mL via INTRAVENOUS
  Filled 2019-11-20: qty 10

## 2019-11-20 MED ORDER — ASPIRIN 81 MG PO CHEW
81.0000 mg | CHEWABLE_TABLET | Freq: Every day | ORAL | Status: DC
  Administered 2019-11-20: 19:00:00 81 mg via ORAL

## 2019-11-20 MED ORDER — NITROGLYCERIN 0.4 MG SL SUBL
0.4000 mg | SUBLINGUAL_TABLET | SUBLINGUAL | Status: DC | PRN
  Administered 2019-11-21 – 2019-11-22 (×3): 0.4 mg via SUBLINGUAL
  Filled 2019-11-20 (×2): qty 1

## 2019-11-20 MED ORDER — PERFLUTREN LIPID MICROSPHERE
INTRAVENOUS | Status: AC
  Filled 2019-11-20: qty 10

## 2019-11-20 MED ORDER — SODIUM CHLORIDE 0.9% FLUSH
3.0000 mL | Freq: Two times a day (BID) | INTRAVENOUS | Status: DC
  Administered 2019-11-20 – 2019-11-25 (×9): 3 mL via INTRAVENOUS

## 2019-11-20 NOTE — Progress Notes (Signed)
Inpatient Diabetes Program Recommendations  AACE/ADA: New Consensus Statement on Inpatient Glycemic Control  Target Ranges:  Prepandial:   less than 140 mg/dL      Peak postprandial:   less than 180 mg/dL (1-2 hours)      Critically ill patients:  140 - 180 mg/dL   Results for ROMIO, DEFORD (MRN [id number]) as of 11/20/2019 15:22  Ref. Range 11/20/2019 06:19 11/20/2019 11:07  Glucose-Capillary Latest Ref Range: 70 - 99 mg/dL 191 (H) 284 (H)   Review of Glycemic Control  Diabetes history: DM2 Outpatient Diabetes medications: 70/30 94 units QAM, 70/30 54 units QPM Current orders for Inpatient glycemic control: Novolog 0-9 units TID with meals  Inpatient Diabetes Program Recommendations:   Correction (SSI): Please consider ordering Novolog 0-5 units QHS for bedtime correction.  Insulin - Meal Coverage: Please consider ordering Novolog 3 units TID with meals for meal coverage if patient eats at least 50% of meals.  Thanks, Barnie Alderman, RN, MSN, CDE Diabetes Coordinator Inpatient Diabetes Program [phone] (Team Pager from 8am to 5pm)

## 2019-11-20 NOTE — Progress Notes (Signed)
ANTICOAGULATION CONSULT NOTE - Initial Consult  Pharmacy Consult for heparin Indication: chest pain/ACS  Allergies  Allergen Reactions  . Tape Other (See Comments)    Causes skin irritation    Patient Measurements: Height: 6' (182.9 cm) Weight: 285 lb (129.3 kg) IBW/kg (Calculated) : 77.6 Heparin Dosing Weight: 106kg  Vital Signs: Temp: 98.2 F (36.8 C) (03/18 1939) Temp Source: Oral (03/18 1939) BP: 136/67 (03/18 1939) Pulse Rate: 66 (03/18 1939)  Labs: Recent Labs    11/19/19 2207 11/19/19 2207 11/20/19 0007 11/20/19 0336 11/20/19 0611 11/20/19 2005  HGB 13.5  --   --  12.6*  --   --   HCT 40.2  --   --  37.8*  --   --   PLT 97*  --   --  76*  --   --   APTT  --   --   --   --   --  59*  LABPROT 16.4*  --   --  18.1*  --   --   INR 1.3*  --   --  1.5*  --   --   HEPARINUNFRC  --   --   --   --   --  1.40*  CREATININE 1.84*  --   --  1.83*  --   --   TROPONINIHS 55*   < > 95* 314* 485*  --    < > = values in this interval not displayed.    Estimated Creatinine Clearance: 48.5 mL/min (A) (by C-G formula based on SCr of 1.83 mg/dL (H)). Assessment: 86 YOM presenting with chest pain with hx CAD s/p CABG, hx of PAF previously on warfarin now on Eliquis (last dose unknown), chronic thrombocytopenia plts at 76.    Initial aptt low at 59  Goal of Therapy:  Heparin level 0.3-0.7 units/ml aPTT 66-102 seconds Monitor platelets by anticoagulation protocol: Yes   Plan:  Increase heparin to 1500 units/hr Recheck in am  Barth Kirks, PharmD, BCPS, BCCCP Clinical Pharmacist [phone]  Please check AMION for all [place] numbers  11/20/2019 9:53 PM

## 2019-11-20 NOTE — Progress Notes (Signed)
Critical Lab: Troponin 314. Provider notified.

## 2019-11-20 NOTE — Progress Notes (Signed)
ANTICOAGULATION CONSULT NOTE - Initial Consult  Pharmacy Consult for Coumadin Indication: atrial fibrillation  No Known Allergies  Patient Measurements: Height: 6' (182.9 cm) Weight: 285 lb (129.3 kg) IBW/kg (Calculated) : 77.6  Vital Signs: Temp: 98.4 F (36.9 C) (03/18 0132) Temp Source: Oral (03/18 0132) BP: 142/68 (03/18 0132) Pulse Rate: 64 (03/18 0132)  Labs: Recent Labs    11/19/19 2207 11/20/19 0007  HGB 13.5  --   HCT 40.2  --   PLT 97*  --   LABPROT 16.4*  --   INR 1.3*  --   CREATININE 1.84*  --   TROPONINIHS 55* 95*    Estimated Creatinine Clearance: 48.2 mL/min (A) (by C-G formula based on SCr of 1.84 mg/dL (H)).   Medical History: Past Medical History:  Diagnosis Date  . AICD (automatic cardioverter/defibrillator) present   . Anxiety   . Arthritis    "occasionally in my right hand" (04/02/2015)  . CAD in native artery 1999  . Cancer ([place])   . CHF (congestive heart failure) ([place])   . Depression   . GERD (gastroesophageal reflux disease)    "occasionally" (04/02/2015)  . History of hiatal hernia   . Hypercholesterolemia   . Hypertension   . Myocardial infarction ([place]) "several"  . OSA on CPAP   . PAF (paroxysmal atrial fibrillation) ([place])   . PTSD (post-traumatic stress disorder)   . Stroke ([place])    "I've had 2; my right leg & arm slightly weaker since" (04/02/2015)  . Type II diabetes mellitus (HCC)     Medications:  Scheduled:  . carvedilol  3.125 mg Oral BID WC  . clopidogrel  75 mg Oral Daily  . [START ON 11/21/2019] donepezil  5 mg Oral QHS  . [START ON 11/21/2019] FLUoxetine  40 mg Oral QHS  . insulin aspart  0-9 Units Subcutaneous TID WC  . lisinopril  40 mg Oral q morning - 10a  . magnesium oxide  400 mg Oral BID  . rosuvastatin  10 mg Oral QHS    Assessment: 74yo male admitted for CP, to continue Coumadin for Afib; the most current records available are ~74yr old and pt cannot state what medications he is taking.  Goal of  Therapy:  INR 2-3 (must confirm)   Plan:  Will await dosing Coumadin until pt's spouse can confirm medications.  Wynona Neat, PharmD, BCPS  11/20/2019,2:22 AM

## 2019-11-20 NOTE — Progress Notes (Signed)
  Echocardiogram 2D Echocardiogram has been performed.  Vernon Briggs 11/20/2019, 11:03 AM

## 2019-11-20 NOTE — H&P (Signed)
History and Physical    Vernon Briggs DOB: 12/30/1945 DOA: 11/19/2019  PCP: [name], MD  Patient coming from: Home.  Chief Complaint: Chest pain.  HPI: Vernon Briggs is a 74 y.o. male with history of CAD status post CABG and stenting last 1 in 2016, chronic kidney disease stage III, diabetes mellitus type 2 hypertension paroxysmal atrial fibrillation ischemic cardiomyopathy status post AICD presents to the ER with complaint of chest pain.  Patient states patient has been having chest pain at times shortness of breath over the last 1 month.  Most of the time the chest pain happens around 8 PM when he lies onto the bed and lasted 2 hours.  Its like a squeezing type of chest pain no radiation no productive cough fever chills.  Resolved without any intervention.  Since has been ongoing patient's wife insisted patient come to the ER.  ED Course: In the ER EKG shows normal sinus rhythm with IVCD chest x-ray unremarkable Covid test was negative.  Patient was afebrile and not hypoxic.  Labs show platelets of 97 which is chronic.  Creatinine 1.8 increased from baseline around 1.6 but only labs available for as is around 2 years ago.  Potassium was 5.2 blood glucose 260.  High sensitive troponin was 55 and 95. Patient admitted for further management of chest pain concerning for ACS.  Review of Systems: As per HPI, rest all negative.   Past Medical History:  Diagnosis Date  . AICD (automatic cardioverter/defibrillator) present   . Anxiety   . Arthritis    "occasionally in my right hand" (04/02/2015)  . CAD in native artery 1999  . Cancer ([place])   . CHF (congestive heart failure) ([place])   . Depression   . GERD (gastroesophageal reflux disease)    "occasionally" (04/02/2015)  . History of hiatal hernia   . Hypercholesterolemia   . Hypertension   . Myocardial infarction ([place]) "several"  . OSA on CPAP   . PAF (paroxysmal atrial fibrillation) ([place])   . PTSD (post-traumatic  stress disorder)   . Stroke ([place])    "I've had 2; my right leg & arm slightly weaker since" (04/02/2015)  . Type II diabetes mellitus ([place])     Past Surgical History:  Procedure Laterality Date  . CARDIAC DEFIBRILLATOR PLACEMENT     [place]  . CORONARY ANGIOPLASTY WITH STENT PLACEMENT     "I've got 7 stents total" (04/02/2015)  . CORONARY ARTERY BYPASS GRAFT  1999   CABG X4; LIMA-LAD, SVG-OM1, SVG-RI, SVG-dRCA  . LEFT HEART CATHETERIZATION WITH CORONARY/GRAFT ANGIOGRAM N/A 09/14/2014   Procedure: LEFT HEART CATHETERIZATION WITH Beatrix Fetters;  Surgeon: Leonie Man, MD;  Location: [facility] CATH LAB;  Service: Cardiovascular;  Laterality: N/A;  . PERCUTANEOUS CORONARY STENT INTERVENTION (PCI-S) Left 09/14/2014   Procedure: PERCUTANEOUS CORONARY STENT INTERVENTION (PCI-S);  Surgeon: Leonie Man, MD;  Location: [facility] CATH LAB;  Service: Cardiovascular;  Laterality: Left;     reports that he has quit smoking. His smoking use included cigarettes. He has a 0.50 pack-year smoking history. He has never used smokeless tobacco. He reports current alcohol use. He reports that he does not use drugs.  No Known Allergies  Family History  Family history unknown: Yes    Prior to Admission medications   Medication Sig Start Date End Date Taking? Authorizing Provider  acetaminophen (TYLENOL) 325 MG tablet Take 2 tablets (650 mg total) by mouth every 4 (four) hours as needed for headache or mild pain. 09/15/14  Kilroy, Luke K, PA-C  carvedilol (COREG) 3.125 MG tablet Take 3.125 mg by mouth 2 (two) times daily with a meal.     [provider]  cholecalciferol (VITAMIN D) 1000 units tablet Take 1,000 Units by mouth daily.    [provider]  clopidogrel (PLAVIX) 75 MG tablet Take 75 mg by mouth daily.    [provider]  donepezil (ARICEPT) 10 MG tablet Take 5 mg by mouth at bedtime.    [provider]  FLUoxetine (PROZAC) 20 MG capsule Take 40 mg by mouth at  bedtime.     [provider]  hydrALAZINE (APRESOLINE) 50 MG tablet Take 1 tablet (50 mg total) by mouth 3 (three) times daily. Patient not taking: Reported on 02/01/2017 09/15/14   Erlene Quan, PA-C  hydrochlorothiazide (HYDRODIURIL) 25 MG tablet Take 1 tablet (25 mg total) by mouth daily. 02/01/17 03/03/17  Maryan Puls, MD  hydroxypropyl methylcellulose / hypromellose (ISOPTO TEARS / GONIOVISC) 2.5 % ophthalmic solution Place 1 drop into both eyes 2 (two) times daily as needed for dry eyes.     [provider]  insulin aspart protamine- aspart (NOVOLOG MIX 70/30) (70-30) 100 UNIT/ML injection Inject 54-94 Units into the skin 2 (two) times daily with a meal. Take 94 units in the morning 30 minutes before breakfast, and 54 units 30 minutes before dinner    [provider]  ketoconazole (NIZORAL) 2 % cream Apply 1 application topically daily as needed for irritation.    [provider]  lisinopril (PRINIVIL,ZESTRIL) 40 MG tablet Take 1 tablet (40 mg total) by mouth at bedtime. Patient taking differently: Take 40 mg by mouth every morning.  09/15/14   Erlene Quan, PA-C  loperamide (IMODIUM A-D) 2 MG tablet Take 2 mg by mouth daily.     [provider]  magnesium oxide (MAG-OX) 400 MG tablet Take 400 mg by mouth 2 (two) times daily.    [provider]  multivitamin-iron-minerals-folic acid (CENTRUM) chewable tablet Chew 1 tablet by mouth daily.    [provider]  nitroGLYCERIN (NITROSTAT) 0.4 MG SL tablet Place 1 tablet (0.4 mg total) under the tongue every 5 (five) minutes x 3 doses as needed for chest pain. 09/15/14   Erlene Quan, PA-C  rosuvastatin (CRESTOR) 20 MG tablet Take 10 mg by mouth at bedtime.     [provider]  torsemide (DEMADEX) 20 MG tablet Take 20 mg by mouth daily.     [provider]  warfarin (COUMADIN) 5 MG tablet Take 5-7.5 mg by mouth at bedtime. Take 1 tablet (5mg ) all days except on Mon and  Fri take 1 1/2 tab (7.5mg )    [provider]    Physical Exam: Constitutional: Moderately built and nourished. Vitals:   11/20/19 0015 11/20/19 0030 11/20/19 0045 11/20/19 0132  BP: 135/72 (!) 120/47 (!) 116/47 (!) 142/68  Pulse: 60 64 63 64  Resp: (!) 22 18 (!) 23 19  Temp:    98.4 F (36.9 C)  TempSrc:    Oral  SpO2: 99% 97% 99% 100%  Weight:      Height:       Eyes: Anicteric no pallor. ENMT: No discharge from the ears eyes nose or mouth. Neck: No mass or.  No neck rigidity. Respiratory: No rhonchi or crepitations. Cardiovascular: S1-S2 heard. Abdomen: Soft nontender bowel sounds present. Musculoskeletal: No edema. Skin: No rash. Neurologic: Alert awake oriented time place and person.  Moves all extremities. Psychiatric:  Appears normal per normal affect.   Labs on Admission: I have personally reviewed following labs and imaging studies  CBC: Recent Labs  Lab 11/19/19 2207  WBC 7.4  HGB 13.5  HCT 40.2  MCV 100.8*  PLT 97*   Basic Metabolic Panel: Recent Labs  Lab 11/19/19 2207  NA 140  K 5.2*  CL 105  CO2 23  GLUCOSE 260*  BUN 49*  CREATININE 1.84*  CALCIUM 9.0   GFR: Estimated Creatinine Clearance: 48.2 mL/min (A) (by C-G formula based on SCr of 1.84 mg/dL (H)). Liver Function Tests: No results for input(s): AST, ALT, ALKPHOS, BILITOT, PROT, ALBUMIN in the last 168 hours. No results for input(s): LIPASE, AMYLASE in the last 168 hours. No results for input(s): AMMONIA in the last 168 hours. Coagulation Profile: Recent Labs  Lab 11/19/19 2207  INR 1.3*   Cardiac Enzymes: No results for input(s): CKTOTAL, CKMB, CKMBINDEX, TROPONINI in the last 168 hours. BNP (last 3 results) No results for input(s): PROBNP in the last 8760 hours. HbA1C: No results for input(s): HGBA1C in the last 72 hours. CBG: No results for input(s): GLUCAP in the last 168 hours. Lipid Profile: No results for input(s): CHOL, HDL, LDLCALC, TRIG, CHOLHDL, LDLDIRECT  in the last 72 hours. Thyroid Function Tests: No results for input(s): TSH, T4TOTAL, FREET4, T3FREE, THYROIDAB in the last 72 hours. Anemia Panel: No results for input(s): VITAMINB12, FOLATE, FERRITIN, TIBC, IRON, RETICCTPCT in the last 72 hours. Urine analysis:    Component Value Date/Time   COLORURINE YELLOW 02/01/2017 1104   APPEARANCEUR CLEAR 02/01/2017 1104   LABSPEC 1.017 02/01/2017 1104   PHURINE 6.0 02/01/2017 1104   GLUCOSEU 150 (A) 02/01/2017 1104   HGBUR NEGATIVE 02/01/2017 1104   [place] 02/01/2017 1104   KETONESUR NEGATIVE 02/01/2017 1104   PROTEINUR >=300 (A) 02/01/2017 1104   UROBILINOGEN 0.2 04/02/2015 1341   NITRITE NEGATIVE 02/01/2017 1104   LEUKOCYTESUR NEGATIVE 02/01/2017 1104   Sepsis Labs: @LABRCNTIP (procalcitonin:4,lacticidven:4) ) Recent Results (from the past 240 hour(s))  Respiratory Panel by RT PCR (Flu A&B, Covid) - Nasopharyngeal Swab     Status: None   Collection Time: 11/19/19 11:45 PM   Specimen: Nasopharyngeal Swab  Result Value Ref Range Status   SARS Coronavirus 2 by RT PCR NEGATIVE NEGATIVE Final    Comment: (NOTE) SARS-CoV-2 target nucleic acids are NOT DETECTED. The SARS-CoV-2 RNA is generally detectable in upper respiratoy specimens during the acute phase of infection. The lowest concentration of SARS-CoV-2 viral copies this assay can detect is 131 copies/mL. A negative result does not preclude SARS-Cov-2 infection and should not be used as the sole basis for treatment or other patient management decisions. A negative result may occur with  improper specimen collection/handling, submission of specimen other than nasopharyngeal swab, presence of viral mutation(s) within the areas targeted by this assay, and inadequate number of viral copies (<131 copies/mL). A negative result must be combined with clinical observations, patient history, and epidemiological information. The expected result is Negative. Fact Sheet for  Patients:  PinkCheek.be Fact Sheet for Healthcare Providers:  GravelBags.it This test is not yet ap proved or cleared by the [place] FDA and  has been authorized for detection and/or diagnosis of SARS-CoV-2 by FDA under an Emergency Use Authorization (EUA). This EUA will remain  in effect (meaning this test can be used) for the duration of the COVID-19 declaration under Section 564(b)(1) of the Act, 21 U.S.C. section 360bbb-3(b)(1), unless the authorization is terminated or revoked sooner.  Influenza A by PCR NEGATIVE NEGATIVE Final   Influenza B by PCR NEGATIVE NEGATIVE Final    Comment: (NOTE) The Xpert Xpress SARS-CoV-2/FLU/RSV assay is intended as an aid in  the diagnosis of influenza from Nasopharyngeal swab specimens and  should not be used as a sole basis for treatment. Nasal washings and  aspirates are unacceptable for Xpert Xpress SARS-CoV-2/FLU/RSV  testing. Fact Sheet for Patients: PinkCheek.be Fact Sheet for Healthcare Providers: GravelBags.it This test is not yet approved or cleared by the [place] FDA and  has been authorized for detection and/or diagnosis of SARS-CoV-2 by  FDA under an Emergency Use Authorization (EUA). This EUA will remain  in effect (meaning this test can be used) for the duration of the  Covid-19 declaration under Section 564(b)(1) of the Act, 21  U.S.C. section 360bbb-3(b)(1), unless the authorization is  terminated or revoked. Performed at [facility], [place] [address], [place], [place] [address]      Radiological Exams on Admission: DG Chest 2 View  Result Date: 11/19/2019 CLINICAL DATA:  74 year old male with chest pain. EXAM: CHEST - 2 VIEW COMPARISON:  Chest radiograph dated 04/02/2015. FINDINGS: There is shallow inspiration. Left lung base linear densities, likely related to atelectatic changes and  interstitial crowding. Asymmetric edema is less likely. Clinical correlation recommended. No focal consolidation, pleural effusion, or pneumothorax. Stable cardiomegaly. Median sternotomy wires and left pectoral AICD device. No acute osseous pathology. IMPRESSION: Shallow inspiration.  No focal consolidation. Electronically Signed   By: Anner Crete M.D.   On: 11/19/2019 22:36    EKG: Independently reviewed.  Normal sinus rhythm with IVCD.  Assessment/Plan Principal Problem:   Chest pain Active Problems:   History of CVA (cerebrovascular accident)   Automatic implantable cardioverter-defibrillator in situ   Type II diabetes mellitus with complication (HCC)   Essential hypertension   S/P CABG x 12-1997   CAD S/P CFX DES 09/14/14   PAF (paroxysmal atrial fibrillation) (HCC)   CKD (chronic kidney disease) stage 2, GFR 60-89 ml/min    1. Chest pain with history of CAD status post CABG and stenting presently chest pain-free symptoms concerning for ACS.  Patient is on Plavix.  Nitroglycerin statins and Coreg.  Consult cardiology.  Will check D-dimer. 2. Diabetes mellitus type 2 presently on sliding scale coverage.  Home dose has to be verified.  Patient is not aware of the exact dose. 3. Hypertension presently on Coreg and as needed IV hydralazine.  Home dose of medication has to be verified. 4. Chronic kidney disease stage III creatinine is around 1.8 baseline about 2 years ago was 1.3-1.5.  Closely monitor. 5. Ischemic cardiomyopathy status post AICD EF in 2016 as per the discharge summary was 35 to 40%.  Has mild lower extremity edema.  Diuretic doses has to be verified. 6. Thrombocytopenia appears to be chronic. 7. Paroxysmal atrial fibrillation on Coreg and Coumadin. 8. Previous history of CVA on Coumadin and statins.  Patient's home medication doses has to be verified and ordered appropriately.   DVT prophylaxis: Coumadin. Code Status: DNR. Family Communication: Discussed with  patient. Disposition Plan: Home. Consults called: Cardiology. Admission status: Observation.   Rise Patience MD [facility] Pager [phone].  If 7PM-7AM, please contact night-coverage [email or web address] Password [email or web address]  11/20/2019, 1:51 AM

## 2019-11-20 NOTE — Progress Notes (Signed)
ANTICOAGULATION CONSULT NOTE - Initial Consult  Pharmacy Consult for heparin Indication: chest pain/ACS  No Known Allergies  Patient Measurements: Height: 6' (182.9 cm) Weight: 285 lb (129.3 kg) IBW/kg (Calculated) : 77.6 Heparin Dosing Weight: 106kg  Vital Signs: Temp: 97.8 F (36.6 C) (03/18 1133) Temp Source: Oral (03/18 1133) BP: 146/72 (03/18 1133) Pulse Rate: 62 (03/18 1133)  Labs: Recent Labs    11/19/19 2207 11/19/19 2207 11/20/19 0007 11/20/19 0336 11/20/19 0611  HGB 13.5  --   --  12.6*  --   HCT 40.2  --   --  37.8*  --   PLT 97*  --   --  76*  --   LABPROT 16.4*  --   --  18.1*  --   INR 1.3*  --   --  1.5*  --   CREATININE 1.84*  --   --  1.83*  --   TROPONINIHS 55*   < > 95* 314* 485*   < > = values in this interval not displayed.    Estimated Creatinine Clearance: 48.5 mL/min (A) (by C-G formula based on SCr of 1.83 mg/dL (H)).   Medical History: Past Medical History:  Diagnosis Date  . AICD (automatic cardioverter/defibrillator) present   . Anxiety   . Arthritis    "occasionally in my right hand" (04/02/2015)  . CAD in native artery 1999  . Cancer ([place])   . CHF (congestive heart failure) ([place])   . Depression   . GERD (gastroesophageal reflux disease)    "occasionally" (04/02/2015)  . History of hiatal hernia   . Hypercholesterolemia   . Hypertension   . Myocardial infarction ([place]) "several"  . OSA on CPAP   . PAF (paroxysmal atrial fibrillation) ([place])   . PTSD (post-traumatic stress disorder)   . Stroke ([place])    "I've had 2; my right leg & arm slightly weaker since" (04/02/2015)  . Type II diabetes mellitus (HCC)     Assessment: 3 YOM presenting with chest pain with hx CAD s/p CABG, hx of PAF previously on warfarin now on Eliquis (last dose unknown), chronic thrombocytopenia plts at 76.    Goal of Therapy:  Heparin level 0.3-0.7 units/ml aPTT 66-102 seconds Monitor platelets by anticoagulation protocol: Yes   Plan:  Start  heparin gtt at 1400 units/hr, no bolus F/u 8 hour aPTT  Bertis Ruddy, PharmD Clinical Pharmacist Please check AMION for all [place] numbers 11/20/2019 12:30 PM

## 2019-11-20 NOTE — Consult Note (Addendum)
Cardiology Consultation:   Patient ID: Vernon Briggs; [id number]; 05-28-1946   Admit date: 11/19/2019 Date of Consult: 11/20/2019  Primary Care Provider: [name], MD Primary Cardiologist: New to [facility] (remotely 2016), [facility]    Patient Profile:   Vernon Briggs is a 73 y.o. male with a hx of CAD status post CABG x4 12/21/1997 [facility] with LIMA to LAD, SVG to dRCA, SVG to OM1, SVG to RI) with PCI x6 in approximately 2011 ([place]) with most recent PCI/DES to LCx in 2016 at [facility], CKD stage III, DM 2, hypertension, hyperlipidemia, obesity, PAF on Eliquis, prior left frontal CVA and ICM s/p ICD in 2013 who is being seen today for the evaluation of chest pain at the request of Dr. Hal Hope.   History of Present Illness:   Vernon Briggs is a 74 year old male with a history as stated above who presented to [facility] on 11/20/2019 with a 33-monthduration of intermittent chest pain with associated shortness of breath. Pt reports that he began having chest pain, described more as a chest pressure almost nightly for the last 6 months.  Discomfort typically begins approximately 8-9pm (more so then will dissipate on its own.  The evening prior to presentation, patient reports symptoms were more intense with longer duration therefore he told his wife and was encouraged to proceed to the ED for further CV evaluation.  Mr. HPomplunwas previously seen by our service remotely in 2016.  At that [facility], [facility] with hypertensive emergency found to have elevated troponins concerning for ACS.  A Myoview nuclear stress test was read as high risk with evidence of prior infarct in the inferior and anterior apical walls with ischemia in the anterolateral wall.  Subsequent echocardiogram revealed an EF of 35 to 40%.  His Coumadin was held and he underwent LHC with LCx DES on 09/14/2014 per Dr. HEllyn Hack  He has not been followed by her service since that time.  Patient [facility]  He denies recurrent symptoms since the 2016 episode as described above.  He denies orthopnea, palpitations, dizziness or syncope. Has mild LE edema for which he takes PTA torsemide 20 mg p.o. daily.  Also on carvedilol, Plavix, lisinopril, and rosuvastatin.  In the ED, EKG with 1st degree AV block and ventricular pacing, changed from prior tracing from 02/01/2017. HS troponin found to be elevated at 95>>> 314>>> 485.  Echocardiogram with pending results. CXR with no acute cardiopulmonary changes. Creatinine mildly elevated at 1.8.  Patient is currently chest pain-free.    Cardiology has been asked to evaluate given CV history along with presenting symptoms, elevated HST and EKG changes consistent with ACS.   Past Medical History:  Diagnosis Date  . AICD (automatic cardioverter/defibrillator) present   . Anxiety   . Arthritis    "occasionally in my right hand" (04/02/2015)  . CAD in native artery 1999  . Cancer ([place]   . CHF (congestive heart failure) ([place]   . Depression   . GERD (gastroesophageal reflux disease)    "occasionally" (04/02/2015)  . History of hiatal hernia   . Hypercholesterolemia   . Hypertension   . Myocardial infarction ([place] "several"  . OSA on CPAP   . PAF ([place]oxysmal atrial fibrillation) ([place]   . PTSD (post-traumatic stress disorder)   . Stroke ([place]    "I've had 2; my right leg & arm slightly weaker since" (04/02/2015)  .  Type II diabetes mellitus ([place])     Past Surgical History:  Procedure Laterality Date  . CARDIAC DEFIBRILLATOR PLACEMENT     Biotronik  . CORONARY ANGIOPLASTY WITH STENT PLACEMENT     "I've got 7 stents total" (04/02/2015)  . CORONARY ARTERY BYPASS GRAFT  1999   CABG X4; LIMA-LAD, SVG-OM1, SVG-RI, SVG-dRCA  . LEFT HEART CATHETERIZATION WITH CORONARY/GRAFT ANGIOGRAM N/A 09/14/2014   Procedure: LEFT HEART CATHETERIZATION WITH Beatrix Fetters;  Surgeon: Leonie Man, MD;  Location:  [facility] CATH LAB;  Service: Cardiovascular;  Laterality: N/A;  . PERCUTANEOUS CORONARY STENT INTERVENTION (PCI-S) Left 09/14/2014   Procedure: PERCUTANEOUS CORONARY STENT INTERVENTION (PCI-S);  Surgeon: Leonie Man, MD;  Location: [facility] CATH LAB;  Service: Cardiovascular;  Laterality: Left;     Prior to Admission medications   Medication Sig Start Date End Date Taking? Authorizing Provider  acetaminophen (TYLENOL) 325 MG tablet Take 2 tablets (650 mg total) by mouth every 4 (four) hours as needed for headache or mild pain. 09/15/14   Erlene Quan, PA-C  carvedilol (COREG) 3.125 MG tablet Take 3.125 mg by mouth 2 (two) times daily with a meal.     [provider]  cholecalciferol (VITAMIN D) 1000 units tablet Take 1,000 Units by mouth daily.    [provider]  clopidogrel (PLAVIX) 75 MG tablet Take 75 mg by mouth daily.    [provider]  donepezil (ARICEPT) 10 MG tablet Take 5 mg by mouth at bedtime.    [provider]  FLUoxetine (PROZAC) 20 MG capsule Take 40 mg by mouth at bedtime.     [provider]  hydrALAZINE (APRESOLINE) 50 MG tablet Take 1 tablet (50 mg total) by mouth 3 (three) times daily. Patient not taking: Reported on 02/01/2017 09/15/14   Erlene Quan, PA-C  hydrochlorothiazide (HYDRODIURIL) 25 MG tablet Take 1 tablet (25 mg total) by mouth daily. 02/01/17 03/03/17  Maryan Puls, MD  hydroxypropyl methylcellulose / hypromellose (ISOPTO TEARS / GONIOVISC) 2.5 % ophthalmic solution Place 1 drop into both eyes 2 (two) times daily as needed for dry eyes.     [provider]  insulin aspart protamine- aspart (NOVOLOG MIX 70/30) (70-30) 100 UNIT/ML injection Inject 54-94 Units into the skin 2 (two) times daily with a meal. Take 94 units in the morning 30 minutes before breakfast, and 54 units 30 minutes before dinner    [provider]  ketoconazole (NIZORAL) 2 % cream Apply 1 application topically daily as needed for  irritation.    [provider]  lisinopril (PRINIVIL,ZESTRIL) 40 MG tablet Take 1 tablet (40 mg total) by mouth at bedtime. Patient taking differently: Take 40 mg by mouth every morning.  09/15/14   Erlene Quan, PA-C  loperamide (IMODIUM A-D) 2 MG tablet Take 2 mg by mouth daily.     [provider]  magnesium oxide (MAG-OX) 400 MG tablet Take 400 mg by mouth 2 (two) times daily.    [provider]  multivitamin-iron-minerals-folic acid (CENTRUM) chewable tablet Chew 1 tablet by mouth daily.    [provider]  nitroGLYCERIN (NITROSTAT) 0.4 MG SL tablet Place 1 tablet (0.4 mg total) under the tongue every 5 (five) minutes x 3 doses as needed for chest pain. 09/15/14   Erlene Quan, PA-C  rosuvastatin (CRESTOR) 20 MG tablet Take 10 mg by mouth at bedtime.     [provider]  torsemide (DEMADEX) 20 MG tablet Take 20 mg by mouth daily.  [provider]  warfarin (COUMADIN) 5 MG tablet Take 5-7.5 mg by mouth at bedtime. Take 1 tablet (23m) all days except on Mon and Fri take 1 1/2 tab (7.539m    [provider]    Inpatient Medications: Scheduled Meds: . carvedilol  3.125 mg Oral BID WC  . clopidogrel  75 mg Oral Daily  . [START ON 11/21/2019] donepezil  5 mg Oral QHS  . [START ON 11/21/2019] FLUoxetine  40 mg Oral QHS  . insulin aspart  0-9 Units Subcutaneous TID WC  . magnesium oxide  400 mg Oral BID  . rosuvastatin  10 mg Oral QHS   Continuous Infusions:  PRN Meds: acetaminophen **OR** acetaminophen, hydrALAZINE, nitroGLYCERIN, ondansetron **OR** ondansetron (ZOFRAN) IV  Allergies:   No Known Allergies  Social History:   Social History   Socioeconomic History  . Marital status: Married    Spouse name: Not on file  . Number of children: Not on file  . Years of education: Not on file  . Highest education level: Not on file  Occupational History  . Not on file  Tobacco Use  . Smoking status: Former Smoker     Packs/day: 0.50    Years: 1.00    Pack years: 0.50    Types: Cigarettes  . Smokeless tobacco: Never Used  . Tobacco comment: "quit smoking cigarettes in the 1960's"  Substance and Sexual Activity  . Alcohol use: Yes    Comment: 04/02/2015 "might have a beer and a mixed drink/month"  . Drug use: No  . Sexual activity: Yes  Other Topics Concern  . Not on file  Social History Narrative  . Not on file   Social Determinants of Health   Financial Resource Strain:   . Difficulty of Paying Living Expenses:   Food Insecurity:   . Worried About RuCharity fundraisern the Last Year:   . RaArboriculturistn the Last Year:   Transportation Needs:   . LaFilm/video editorMedical):   . Marland Kitchenack of Transportation (Non-Medical):   Physical Activity:   . Days of Exercise per Week:   . Minutes of Exercise per Session:   Stress:   . Feeling of Stress :   Social Connections:   . Frequency of Communication with Friends and Family:   . Frequency of Social Gatherings with Friends and Family:   . Attends Religious Services:   . Active Member of Clubs or Organizations:   . Attends ClArchivisteetings:   . Marland Kitchenarital Status:   Intimate Partner Violence:   . Fear of Current or Ex-Partner:   . Emotionally Abused:   . Marland Kitchenhysically Abused:   . Sexually Abused:     Family History:   Family History  Family history unknown: Yes   Family Status:  No family status information on file.    ROS:  Please see the history of present illness.  All other ROS reviewed and negative.     Physical Exam/Data:   Vitals:   11/20/19 0045 11/20/19 0132 11/20/19 0435 11/20/19 0741  BP: (!) 116/47 (!) 142/68 123/67 133/79  Pulse: 63 64 (!) 59 62  Resp: (!) '23 19 20 17  ' Temp:  98.4 F (36.9 C) 98.2 F (36.8 C) 97.7 F (36.5 C)  TempSrc:  Oral Oral Oral  SpO2: 99% 100% 96% 100%  Weight:      Height:        Intake/Output Summary (Last 24 hours) at 11/20/2019 1003  Last data filed at 11/20/2019  0923 Gross per 24 hour  Intake 840 ml  Output 400 ml  Net 440 ml   Filed Weights   11/19/19 2205  Weight: 129.3 kg   Body mass index is 38.65 kg/m.   General: Obese, NAD Head: Normocephalic, atraumatic, sclera non-icteric, no xanthomas, clear, moist mucus membranes. Neck: Negative for carotid bruits. No JVD Lungs:Clear to ausculation bilaterally. No wheezes, rales, or rhonchi. Breathing is unlabored. Cardiovascular: RRR with S1 S2. No murmurs Abdomen: Soft, non-tender, distended. No obvious abdominal masses. Extremities: 1-2+ BLE edema. Radial/DP/PT pulses 2+ bilaterally Neuro: Alert and oriented. No focal deficits. No facial asymmetry. MAE spontaneously. Psych: Responds to questions appropriately with normal affect.     EKG:  The EKG was personally reviewed and demonstrates:  11/20/19 NSR with 1st degree AV block with V-pacing, changed from prior tracing 02/01/2017 Telemetry:  Telemetry was personally reviewed and demonstrates:  11/20/19 NSR with V-pacing    Relevant CV Studies:  Echocardiogram 09/09/2014:  Study Conclusions   - Left ventricle: The cavity size was normal. Wall thickness was  increased in a pattern of mild LVH. Systolic function was  moderately reduced. The estimated ejection fraction was in the  range of 35% to 40%. Akinesis of the midanteroseptal and anterior  myocardium. Doppler parameters are consistent with abnormal left  ventricular relaxation (grade 1 diastolic dysfunction).  Indeterminate mean left atrial filling pressures.  - Aortic valve: There was mild regurgitation.  - Mitral valve: Calcified annulus.  - Left atrium: The atrium was mildly dilated.   Impressions:   - Little change on direct comparison with study from February 2014.   TTE 10/21/12 Study Conclusions  - Left ventricle: The cavity size was normal. There was moderate concentric hypertrophy. Systolic function was mildly to moderately reduced. The estimated  ejection fraction was in the range of 40% to 45%. There is akinesis of the mid-distalanteroseptal myocardium. - Mitral valve: Mild regurgitation. - Left atrium: The atrium was mildly dilated. - Tricuspid valve: Moderate regurgitation. - Pulmonary arteries: PA peak pressure: 63m Hg (S).  LHC 09/14/2014:  Coronary Anatomy:  Dominance: Unknown, likely right  Left Main: Large-caliber vessel with distal 30% stenosis. Bifurcates into the nondominant circumflex and LAD. The apparent ramus medius was not visualized. LAD: Proximal 70% stenosis before giving off a small moderate caliber diagonal branch. The vessels and 100 and occluded.  D1: Small moderate caliber vessel that covers the proximal anterolateral wall. Minimal luminal irregularities.  LIMA-LAD: Widely patent, tortuous graft to the mid LAD. It perfuses down around the inferolateral apex with minimal luminal irregularities distally. Left Circumflex: Nondominant, large caliber vessel that has a proximal and mid OM both of which are subtotal totally occluded in the mid vessels. The main circumflex terminates as a lateral/inferolateral OM branch. There are at least 2 stents in the proximal to mid section. Just beyond the distal stent there is a focal irregular 80% stenosis at the takeoff on the small marginal branches. The remainder the vessel is relatively free of disease and trifurcates into 3 branches distally along the inferolateral wall.  SVG-OM: Ostial occluded Ramus intermedius: Not seen on native left angiography.  SVG-RI: Ostial occluded  RCA: Large-caliber, likely dominant vessel that is 100 and occluded in the mid vessel. There are left-right collaterals from the circumflex AV groove branch that appeared to fill part of the posterior lateral system. Some left to right collaterals from the graft LAD perfuses part of the PDA distribution.  SVG-distal RCA: Ostial  occluded with stents visible.  After reviewing the initial  angiography, the culprit lesion was thought to be 80% irregular lesion in the mid circumflex.  Preparation were made to proceed with PCI on this lesion.  Percutaneous Coronary Intervention:  Sheath exchanged for 6 Fr Guide: 6 Fr   XB LAD 3.5       Guidewire: BMW Predilation Balloon: Trek 2.5 mm x 12 mm;  ? 16 Atm x 30 Sec, Stent: Promus Premier DES 3.5 mm x 16 mm;  ? 16 Atm x 30 Sec, postdilated to 18  Atm x 30Sec - Final diameter 3.8 MM Post-dilation Balloon:Darden Trek   4.0 mm x 8 m;  ? 16 Atm x 45 Sec, Final Diameter: 4.0 mm  Laboratory Data:  Chemistry Recent Labs  Lab 11/19/19 2207 11/20/19 0336  NA 140 141  K 5.2* 4.4  CL 105 104  CO2 23 28  GLUCOSE 260* 211*  BUN 49* 45*  CREATININE 1.84* 1.83*  CALCIUM 9.0 8.8*  GFRNONAA 35* 35*  GFRAA 41* 41*  ANIONGAP 12 9    Total Protein  Date Value Ref Range Status  11/20/2019 5.8 (L) 6.5 - 8.1 g/dL Final   Albumin  Date Value Ref Range Status  11/20/2019 3.5 3.5 - 5.0 g/dL Final   AST  Date Value Ref Range Status  11/20/2019 27 15 - 41 U/L Final   ALT  Date Value Ref Range Status  11/20/2019 21 0 - 44 U/L Final   Alkaline Phosphatase  Date Value Ref Range Status  11/20/2019 77 38 - 126 U/L Final   Total Bilirubin  Date Value Ref Range Status  11/20/2019 0.9 0.3 - 1.2 mg/dL Final   Hematology Recent Labs  Lab 11/19/19 2207 11/20/19 0336  WBC 7.4 6.7  RBC 3.99* 3.81*  HGB 13.5 12.6*  HCT 40.2 37.8*  MCV 100.8* 99.2  MCH 33.8 33.1  MCHC 33.6 33.3  RDW 14.1 14.1  PLT 97* 76*   Cardiac EnzymesNo results for input(s): TROPONINI in the last 168 hours. No results for input(s): TROPIPOC in the last 168 hours.  BNPNo results for input(s): BNP, PROBNP in the last 168 hours.  DDimer  Recent Labs  Lab 11/20/19 0611  DDIMER 0.69*   TSH:  Lab Results  Component Value Date   TSH 2.402 11/20/2019   Lipids: Lab Results  Component Value Date   CHOL 75 10/21/2012   HDL 27 (L) 10/21/2012   LDLCALC 31  10/21/2012   TRIG 85 10/21/2012   CHOLHDL 2.8 10/21/2012   HgbA1c: Lab Results  Component Value Date   HGBA1C 7.2 (H) 04/02/2015    Radiology/Studies:  DG Chest 2 View  Result Date: 11/19/2019 CLINICAL DATA:  74 year old male with chest pain. EXAM: CHEST - 2 VIEW COMPARISON:  Chest radiograph dated 04/02/2015. FINDINGS: There is shallow inspiration. Left lung base linear densities, likely related to atelectatic changes and interstitial crowding. Asymmetric edema is less likely. Clinical correlation recommended. No focal consolidation, pleural effusion, or pneumothorax. Stable cardiomegaly. Median sternotomy wires and left pectoral AICD device. No acute osseous pathology. IMPRESSION: Shallow inspiration.  No focal consolidation. Electronically Signed   By: Anner Crete M.D.   On: 11/19/2019 22:36   Assessment and Plan:   1.  NSTEMI with hx of CAD s/p CABGx4 with multiple PCI/DES x6: -Patient presented with a 6 month history of intermittent chest pain now with EKG changes with new ST changes in lead III>>changed from prior tracings from 2018. He has a  history of CAD status post CABG x4 in 1999 with prior stenting x7 approximately 2011 at [facility] and last PCI/DES to LCx 09/14/2014 at [facility] by Dr. Ellyn Hack. He is typically followed for cardiology care at [facility].  -HS troponin found to be elevated at [phone] -CXR without acute changes  -Creatinine elevated at 1.83 with a baseline which appears to be in the 1.3-1.5 range however most results are from 2016 -Patient currently chest pain free -PTA medications include ASA, Plavix, carvedilol -Start IV Heparin infusion per pharmacy for ACS -Plan for repeat LHC to further evaluate coronary anatomy given elevated HST, prior CV hx with poor cardiology follow up and presentation consistent with ACS.   2. ICM s/p ICD (Biotronik): -s/p AICD placement with known EF per last available echocardiogram in Epic to be 35-40% -Repeat echocardiogram with  pending results  -Has moderate fluid volume overload on exam today with BLE edema. Takes PTA tosemide 37m PO QD -Hold diuretics for now in the setting of renal dysfunction and plan for LHC -Daily weights, 285lb today  -Strict I&O, net positive 4475m  3. PAF: -Maintaining NSR with first-degree AV block on telemetry>>V pacing  -Anticoagulated with Eliquis>>previoulsy on Coumadin therapy   -Continue carvedilol, rate controlled   4. HTN: -Stable, 133/79>123/67>142/68 -Continue carvedilol, hydralazine -Continue to hold PTA lisinopril to allow for diuresis  -If BP elevated consider restarting hydralazine>>would continue to hold for now   5. Hx of CVA: -Stable with no acute concern>>no deficit  -Will continue Plavix and statin therapies   6. DM2: -SSI for glucose control while inpatient status  -Management per primary team   7. CKD Stage III: -Remotely with stable creatinine however this admission with creatinine elevated at 1.84>>1.83 -Previously 1.2-1.3 in 2018 -Will need to monitor closely and avoid nephrotoxic medications  -No ACEI/ARB for ICM    For questions or updates, please contact [facility] Please consult [email or web address] for contact info under Cardiology/STEMI.   Signed, Kathyrn DrownP-C HeAmoritaager: [id number]/18/2021 10:03 AM

## 2019-11-20 NOTE — ED Notes (Signed)
Pt lights dimmed & head of bed lowered for comfort. No other needs expressed at this time

## 2019-11-20 NOTE — Progress Notes (Signed)
Progress Note    Vernon Briggs  [id number] DOB: 1946-03-01  DOA: 11/19/2019 PCP: [name], MD    Brief Narrative:   Chief complaint: chest pain  Medical records reviewed and are as summarized below:  Vernon Briggs is an 74 y.o. male with a past medical history that includes CAD status post CABG x4 in 1999, [place] x6 in 2011, DR/DES to LCx in 2016, chronic kidney disease stage III, diabetes, hypertension, hyperlipidemia, obesity, PAF on Eliquis, prior from CVA and ICM status post ICD 2013 presented to the emergency department March 17 complaint of chest pain.  Work-up in the emergency department revealed elevated troponins and an EKG with sinus rhythm,nonspecific IVCD with LAD, LVH with secondary repolarization abnormality new from prior concerning for ACS.  Evaluated by cardiology who opined NSTEMI recommend heparin drip and heart catheterization tomorrow.  Assessment/Plan:   Principal Problem:   NSTEMI (non-ST elevated myocardial infarction) [facility]) Active Problems:   Chest pain   CKD (chronic kidney disease) stage 2, GFR 60-89 ml/min   Unstable angina (HCC)   Automatic implantable cardioverter-defibrillator in situ   Type II diabetes mellitus with complication (HCC)   Essential hypertension   S/P CABG x 12-1997   CAD S/P CFX DES 09/14/14   PAF (paroxysmal atrial fibrillation) (HCC)   Obesity   History of CVA (cerebrovascular accident)   Thrombocytopenia-plts 90-70K  #1.  NSTEMI.  Patient reports being pain-free this morning.  Troponins elevated 95>>314>>485, EKG with first-degree AV block and ventricular pacing change from prior tracing, chest x-ray with no acute cardiopulmonary changes.  He has had an echo done and results are pending.  Evaluated by cardiology who recommends starting a heparin drip and cath tomorrow. -Heparin drip per pharmacy per cardiology -Follow echo results -Follow lipid panel -Continue Plavix and beta-blocker and statin -Appreciate  cardiology assistance  #2.  Chronic kidney disease stage III.  Creatinine 1.8 on admission.  Unclear what his baseline is but 2 years ago closer to 1.5.  Home medications include Demadex and lisinopril. -Hold nephrotoxins -Monitor intake and output -Obtain daily weights -Recheck in the morning  #3.  Hypertension.  Fair control.  Home medications include lisinopril, Coreg, lisinopril hydrochlorothiazide, hydralazine -Continue Coreg, hydralazine as needed -Monitor closely  #4.  PAF, home meds include beta-blocker and coumadin.  EKG as noted above.  -Continue beta-blocker -Heparin drip as noted  #5.  Diabetes type 2.  Serum glucose on admission 260.  Home medications include 70/30 insulin. -Obtain an hemoglobin A1c -Sliding scale insulin for optimal control  #6.  History of CVA.  No deficits.  Home medications include Plavix and statin -Continue home meds  #7.  Obesity. B MI 38.6  -Nutritional consult  #8.  Thrombocytopenia.  Chart review indicates this is chronic.  No sign symptoms of active bleeding.  #9.  Ischemic cardiomyopathy.  Status post ICD.  Home medications include Demadex hydrochlorothiazide.  These medications are on hold per cardiology for cath tomorrow.  He does have some bilateral lower extremity edema. -Follow echo results -Daily weights -Intake and output    Family Communication/Anticipated D/C date and plan/Code Status   DVT prophylaxis: heparin gtt Code Status: Full Code.  Family Communication: patient Disposition Plan: home when ready   Medical Consultants:    [facility]   Anti-Infectives:    None  Subjective:   Lying in bed denies pain or discomfort.  Obviously somewhat short of breath  Objective:    Vitals:   11/20/19 0132 11/20/19 0435 11/20/19  0741 11/20/19 1133  BP: (!) 142/68 123/67 133/79 (!) 146/72  Pulse: 64 (!) 59 62 62  Resp: 19 20 17 16   Temp: 98.4 F (36.9 C) 98.2 F (36.8 C) 97.7 F (36.5 C) 97.8 F (36.6 C)    TempSrc: Oral Oral Oral Oral  SpO2: 100% 96% 100% 100%  Weight:      Height:        Intake/Output Summary (Last 24 hours) at 11/20/2019 1250 Last data filed at 11/20/2019 [id number] Gross per 24 hour  Intake 840 ml  Output 400 ml  Net 440 ml   Filed Weights   11/19/19 2205  Weight: 129.3 kg    Exam: General: Alert obese, somewhat unkept appearing no acute distress CV regular rate and rhythm heart sounds are distant I hear no murmurs no gallop 1+ lower extremity edema bilaterally feet are also somewhat mottled and cool to the touch pedal pulses are present Respiratory: Moderate increased work of breathing with conversation and repositioning himself in bed.  Breath sounds are distant but clear I hear no wheezes or crackles Abdomen: Obese somewhat distended and slightly firm positive bowel sounds throughout nontender to palpation no mass organomegaly noted Neuro awake alert oriented x3 speech clear facial symmetry  Data Reviewed:   I have personally reviewed following labs and imaging studies:  Labs: Labs show the following:   Basic Metabolic Panel: Recent Labs  Lab 11/19/19 2207 11/20/19 0336  NA 140 141  K 5.2* 4.4  CL 105 104  CO2 23 28  GLUCOSE 260* 211*  BUN 49* 45*  CREATININE 1.84* 1.83*  CALCIUM 9.0 8.8*   GFR Estimated Creatinine Clearance: 48.5 mL/min (A) (by C-G formula based on SCr of 1.83 mg/dL (H)). Liver Function Tests: Recent Labs  Lab 11/20/19 0336  AST 27  ALT 21  ALKPHOS 77  BILITOT 0.9  PROT 5.8*  ALBUMIN 3.5   No results for input(s): LIPASE, AMYLASE in the last 168 hours. No results for input(s): AMMONIA in the last 168 hours. Coagulation profile Recent Labs  Lab 11/19/19 2207 11/20/19 0336  INR 1.3* 1.5*    CBC: Recent Labs  Lab 11/19/19 2207 11/20/19 0336  WBC 7.4 6.7  NEUTROABS  --  4.6  HGB 13.5 12.6*  HCT 40.2 37.8*  MCV 100.8* 99.2  PLT 97* 76*   Cardiac Enzymes: No results for input(s): CKTOTAL, CKMB, CKMBINDEX,  TROPONINI in the last 168 hours. BNP (last 3 results) No results for input(s): PROBNP in the last 8760 hours. CBG: Recent Labs  Lab 11/20/19 0619 11/20/19 1107  GLUCAP 191* 284*   D-Dimer: Recent Labs    11/20/19 0611  DDIMER 0.69*   Hgb A1c: No results for input(s): HGBA1C in the last 72 hours. Lipid Profile: No results for input(s): CHOL, HDL, LDLCALC, TRIG, CHOLHDL, LDLDIRECT in the last 72 hours. Thyroid function studies: Recent Labs    11/20/19 0336  TSH 2.402   Anemia work up: No results for input(s): VITAMINB12, FOLATE, FERRITIN, TIBC, IRON, RETICCTPCT in the last 72 hours. Sepsis Labs: Recent Labs  Lab 11/19/19 2207 11/20/19 0336  WBC 7.4 6.7    Microbiology Recent Results (from the past 240 hour(s))  Respiratory Panel by RT PCR (Flu A&B, Covid) - Nasopharyngeal Swab     Status: None   Collection Time: 11/19/19 11:45 PM   Specimen: Nasopharyngeal Swab  Result Value Ref Range Status   SARS Coronavirus 2 by RT PCR NEGATIVE NEGATIVE Final    Comment: (NOTE) SARS-CoV-2 target  nucleic acids are NOT DETECTED. The SARS-CoV-2 RNA is generally detectable in upper respiratoy specimens during the acute phase of infection. The lowest concentration of SARS-CoV-2 viral copies this assay can detect is 131 copies/mL. A negative result does not preclude SARS-Cov-2 infection and should not be used as the sole basis for treatment or other patient management decisions. A negative result may occur with  improper specimen collection/handling, submission of specimen other than nasopharyngeal swab, presence of viral mutation(s) within the areas targeted by this assay, and inadequate number of viral copies (<131 copies/mL). A negative result must be combined with clinical observations, patient history, and epidemiological information. The expected result is Negative. Fact Sheet for Patients:  PinkCheek.be Fact Sheet for Healthcare Providers:    GravelBags.it This test is not yet ap proved or cleared by the [place] FDA and  has been authorized for detection and/or diagnosis of SARS-CoV-2 by FDA under an Emergency Use Authorization (EUA). This EUA will remain  in effect (meaning this test can be used) for the duration of the COVID-19 declaration under Section 564(b)(1) of the Act, 21 U.S.C. section 360bbb-3(b)(1), unless the authorization is terminated or revoked sooner.    Influenza A by PCR NEGATIVE NEGATIVE Final   Influenza B by PCR NEGATIVE NEGATIVE Final    Comment: (NOTE) The Xpert Xpress SARS-CoV-2/FLU/RSV assay is intended as an aid in  the diagnosis of influenza from Nasopharyngeal swab specimens and  should not be used as a sole basis for treatment. Nasal washings and  aspirates are unacceptable for Xpert Xpress SARS-CoV-2/FLU/RSV  testing. Fact Sheet for Patients: PinkCheek.be Fact Sheet for Healthcare Providers: GravelBags.it This test is not yet approved or cleared by the [place] FDA and  has been authorized for detection and/or diagnosis of SARS-CoV-2 by  FDA under an Emergency Use Authorization (EUA). This EUA will remain  in effect (meaning this test can be used) for the duration of the  Covid-19 declaration under Section 564(b)(1) of the Act, 21  U.S.C. section 360bbb-3(b)(1), unless the authorization is  terminated or revoked. Performed at [facility], [place] [address] [place], [place] [address]     Procedures and diagnostic studies:  DG Chest 2 View  Result Date: 11/19/2019 CLINICAL DATA:  74 year old male with chest pain. EXAM: CHEST - 2 VIEW COMPARISON:  Chest radiograph dated 04/02/2015. FINDINGS: There is shallow inspiration. Left lung base linear densities, likely related to atelectatic changes and interstitial crowding. Asymmetric edema is less likely. Clinical correlation recommended. No  focal consolidation, pleural effusion, or pneumothorax. Stable cardiomegaly. Median sternotomy wires and left pectoral AICD device. No acute osseous pathology. IMPRESSION: Shallow inspiration.  No focal consolidation. Electronically Signed   By: Anner Crete M.D.   On: 11/19/2019 22:36    Medications:   . carvedilol  3.125 mg Oral BID WC  . clopidogrel  75 mg Oral Daily  . [START ON 11/21/2019] donepezil  5 mg Oral QHS  . [START ON 11/21/2019] FLUoxetine  40 mg Oral QHS  . insulin aspart  0-9 Units Subcutaneous TID WC  . magnesium oxide  400 mg Oral BID  . perflutren lipid microspheres (DEFINITY) IV suspension      . rosuvastatin  10 mg Oral QHS  . sodium chloride flush  3 mL Intravenous Q12H   Continuous Infusions: . heparin       LOS: 0 days   Radene Gunning NP [facility]   How to contact the [facility] Attending or Consulting provider [place]  or covering provider during after hours [place], for this patient?  1. Check the care team in [facility] and look for a) attending/consulting [facility] provider listed and b) the [facility] team listed 2. Log into [email or web address] and use [place]'s universal password to access. If you do not have the password, please contact the hospital operator. 3. Locate the [facility] provider you are looking for under [facility] and page to a number that you can be directly reached. 4. If you still have difficulty reaching the provider, please page the [facility] (Director on Call) for the Hospitalists listed on amion for assistance.  11/20/2019, 12:50 PM

## 2019-11-21 ENCOUNTER — Encounter (HOSPITAL_COMMUNITY)
Admission: EM | Disposition: A | Discharge status: Home Health | Payer: Self-pay | Source: Home / Self Care | Attending: Family Medicine

## 2019-11-21 DIAGNOSIS — Z95810 Presence of automatic (implantable) cardiac defibrillator: Secondary | ICD-10-CM

## 2019-11-21 DIAGNOSIS — I255 Ischemic cardiomyopathy: Secondary | ICD-10-CM

## 2019-11-21 DIAGNOSIS — D696 Thrombocytopenia, unspecified: Secondary | ICD-10-CM

## 2019-11-21 DIAGNOSIS — N1832 Chronic kidney disease, stage 3b: Secondary | ICD-10-CM

## 2019-11-21 DIAGNOSIS — I214 Non-ST elevation (NSTEMI) myocardial infarction: Principal | ICD-10-CM

## 2019-11-21 DIAGNOSIS — I25810 Atherosclerosis of coronary artery bypass graft(s) without angina pectoris: Secondary | ICD-10-CM

## 2019-11-21 HISTORY — PX: LEFT HEART CATH AND CORS/GRAFTS ANGIOGRAPHY: CATH118250

## 2019-11-21 LAB — GLUCOSE, CAPILLARY
Glucose-Capillary: 325 mg/dL — ABNORMAL HIGH (ref 70–99)
Glucose-Capillary: 258 mg/dL — ABNORMAL HIGH (ref 70–99)
Glucose-Capillary: 243 mg/dL — ABNORMAL HIGH (ref 70–99)
Glucose-Capillary: 248 mg/dL — ABNORMAL HIGH (ref 70–99)

## 2019-11-21 LAB — CBC
HCT: 38.6 % — ABNORMAL LOW (ref 39.0–52.0)
Hemoglobin: 12.8 g/dL — ABNORMAL LOW (ref 13.0–17.0)
MCH: 33.2 pg (ref 26.0–34.0)
MCHC: 33.2 g/dL (ref 30.0–36.0)
MCV: 100.3 fL — ABNORMAL HIGH (ref 80.0–100.0)
Platelets: 69 10*3/uL — ABNORMAL LOW (ref 150–400)
RBC: 3.85 MIL/uL — ABNORMAL LOW (ref 4.22–5.81)
RDW: 14.2 % (ref 11.5–15.5)
WBC: 8.4 10*3/uL (ref 4.0–10.5)
nRBC: 0 % (ref 0.0–0.2)

## 2019-11-21 LAB — BASIC METABOLIC PANEL
Anion gap: 12 (ref 5–15)
BUN: 46 mg/dL — ABNORMAL HIGH (ref 8–23)
CO2: 24 mmol/L (ref 22–32)
Calcium: 8.8 mg/dL — ABNORMAL LOW (ref 8.9–10.3)
Chloride: 101 mmol/L (ref 98–111)
Creatinine, Ser: 1.86 mg/dL — ABNORMAL HIGH (ref 0.61–1.24)
GFR calc Af Amer: 40 mL/min — ABNORMAL LOW (ref >60)
GFR calc non Af Amer: 35 mL/min — ABNORMAL LOW (ref >60)
Glucose, Bld: 314 mg/dL — ABNORMAL HIGH (ref 70–99)
Potassium: 4.7 mmol/L (ref 3.5–5.1)
Sodium: 137 mmol/L (ref 135–145)

## 2019-11-21 LAB — HEPARIN LEVEL (UNFRACTIONATED): Heparin Unfractionated: 0.99 IU/mL — ABNORMAL HIGH (ref 0.30–0.70)

## 2019-11-21 LAB — SURGICAL PCR SCREEN
MRSA, PCR: NEGATIVE
Staphylococcus aureus: NEGATIVE

## 2019-11-21 LAB — LIPID PANEL
Cholesterol: 120 mg/dL (ref 0–200)
HDL: 22 mg/dL — ABNORMAL LOW (ref >40)
LDL Cholesterol: 22 mg/dL (ref 0–99)
Total CHOL/HDL Ratio: 5.5 RATIO
Triglycerides: 379 mg/dL — ABNORMAL HIGH (ref <150)
VLDL: 76 mg/dL — ABNORMAL HIGH (ref 0–40)

## 2019-11-21 LAB — APTT: aPTT: 52 seconds — ABNORMAL HIGH (ref 24–36)

## 2019-11-21 SURGERY — LEFT HEART CATH AND CORS/GRAFTS ANGIOGRAPHY
Anesthesia: LOCAL

## 2019-11-21 MED ORDER — FENTANYL CITRATE (PF) 100 MCG/2ML IJ SOLN
INTRAMUSCULAR | Status: DC | PRN
  Administered 2019-11-21: 25 ug via INTRAVENOUS

## 2019-11-21 MED ORDER — IOHEXOL 350 MG/ML SOLN
INTRAVENOUS | Status: DC | PRN
  Administered 2019-11-21: 65 mL

## 2019-11-21 MED ORDER — HEPARIN (PORCINE) IN NACL 1000-0.9 UT/500ML-% IV SOLN
INTRAVENOUS | Status: DC | PRN
  Administered 2019-11-21 (×2): 500 mL

## 2019-11-21 MED ORDER — FENTANYL CITRATE (PF) 100 MCG/2ML IJ SOLN
INTRAMUSCULAR | Status: AC
  Filled 2019-11-21: qty 2

## 2019-11-21 MED ORDER — LIDOCAINE HCL (PF) 1 % IJ SOLN
INTRAMUSCULAR | Status: DC | PRN
  Administered 2019-11-21: 2 mL

## 2019-11-21 MED ORDER — INSULIN GLARGINE 100 UNIT/ML ~~LOC~~ SOLN
10.0000 [IU] | Freq: Every day | SUBCUTANEOUS | Status: DC
  Administered 2019-11-21: 10 [IU] via SUBCUTANEOUS
  Filled 2019-11-21 (×2): qty 0.1

## 2019-11-21 MED ORDER — SODIUM CHLORIDE 0.9 % IV SOLN: 250.0000 mL | INTRAVENOUS | Status: DC | PRN

## 2019-11-21 MED ORDER — MIDAZOLAM HCL 2 MG/2ML IJ SOLN
INTRAMUSCULAR | Status: AC
  Filled 2019-11-21: qty 2

## 2019-11-21 MED ORDER — HEPARIN SODIUM (PORCINE) 1000 UNIT/ML IJ SOLN
INTRAMUSCULAR | Status: DC | PRN
  Administered 2019-11-21: 6000 [IU] via INTRAVENOUS

## 2019-11-21 MED ORDER — VERAPAMIL HCL 2.5 MG/ML IV SOLN
INTRAVENOUS | Status: DC | PRN
  Administered 2019-11-21: 10 mL via INTRA_ARTERIAL

## 2019-11-21 MED ORDER — MIDAZOLAM HCL 2 MG/2ML IJ SOLN
INTRAMUSCULAR | Status: DC | PRN
  Administered 2019-11-21: 1 mg via INTRAVENOUS

## 2019-11-21 MED ORDER — ASPIRIN 81 MG PO CHEW
81.0000 mg | CHEWABLE_TABLET | Freq: Every day | ORAL | Status: DC
  Administered 2019-11-22 – 2019-11-25 (×3): 81 mg via ORAL
  Filled 2019-11-21 (×4): qty 1

## 2019-11-21 MED ORDER — SODIUM CHLORIDE 0.9% FLUSH
3.0000 mL | Freq: Two times a day (BID) | INTRAVENOUS | Status: DC
  Administered 2019-11-22 – 2019-11-24 (×3): 3 mL via INTRAVENOUS

## 2019-11-21 MED ORDER — SODIUM CHLORIDE 0.9% FLUSH: 3.0000 mL | INTRAVENOUS | Status: DC | PRN

## 2019-11-21 MED ORDER — LABETALOL HCL 5 MG/ML IV SOLN: 10.0000 mg | INTRAVENOUS | Status: AC | PRN

## 2019-11-21 MED ORDER — LIDOCAINE HCL (PF) 1 % IJ SOLN
INTRAMUSCULAR | Status: AC
  Filled 2019-11-21: qty 30

## 2019-11-21 MED ORDER — FUROSEMIDE 40 MG PO TABS
40.0000 mg | ORAL_TABLET | Freq: Every day | ORAL | Status: DC
  Administered 2019-11-21: 40 mg via ORAL
  Filled 2019-11-21: qty 1

## 2019-11-21 MED ORDER — ICOSAPENT ETHYL 1 G PO CAPS
2.0000 g | ORAL_CAPSULE | Freq: Two times a day (BID) | ORAL | Status: DC
  Administered 2019-11-21 – 2019-11-26 (×11): 2 g via ORAL
  Filled 2019-11-21 (×12): qty 2

## 2019-11-21 MED ORDER — SODIUM CHLORIDE 0.9 % IV SOLN: INTRAVENOUS | Status: DC

## 2019-11-21 MED ORDER — HEPARIN SODIUM (PORCINE) 1000 UNIT/ML IJ SOLN
INTRAMUSCULAR | Status: AC
  Filled 2019-11-21: qty 1

## 2019-11-21 MED ORDER — HEPARIN (PORCINE) 25000 UT/250ML-% IV SOLN
1500.0000 [IU]/h | INTRAVENOUS | Status: DC
  Administered 2019-11-21: 1400 [IU]/h via INTRAVENOUS
  Filled 2019-11-21: qty 250

## 2019-11-21 MED ORDER — ASPIRIN 81 MG PO CHEW: 81.0000 mg | CHEWABLE_TABLET | ORAL | Status: DC

## 2019-11-21 MED ORDER — IOHEXOL 350 MG/ML SOLN
INTRAVENOUS | Status: AC
  Filled 2019-11-21: qty 1

## 2019-11-21 MED ORDER — HYDRALAZINE HCL 20 MG/ML IJ SOLN: 10.0000 mg | INTRAMUSCULAR | Status: AC | PRN

## 2019-11-21 MED ORDER — SODIUM CHLORIDE 0.9 % IV SOLN: INTRAVENOUS | Status: AC

## 2019-11-21 MED ORDER — MUPIROCIN 2 % EX OINT
1.0000 "application " | TOPICAL_OINTMENT | Freq: Two times a day (BID) | CUTANEOUS | Status: DC
  Administered 2019-11-21 – 2019-11-24 (×8): 1 via NASAL
  Filled 2019-11-21 (×4): qty 22

## 2019-11-21 MED ORDER — VERAPAMIL HCL 2.5 MG/ML IV SOLN
INTRAVENOUS | Status: AC
  Filled 2019-11-21: qty 2

## 2019-11-21 MED ORDER — ASPIRIN 81 MG PO CHEW
81.0000 mg | CHEWABLE_TABLET | ORAL | Status: AC
  Administered 2019-11-21: 06:00:00 81 mg via ORAL
  Filled 2019-11-21: qty 1

## 2019-11-21 MED ORDER — HEPARIN (PORCINE) 25000 UT/250ML-% IV SOLN
1950.0000 [IU]/h | INTRAVENOUS | Status: DC
  Administered 2019-11-21: 20:00:00 1500 [IU]/h via INTRAVENOUS
  Administered 2019-11-22: 1700 [IU]/h via INTRAVENOUS
  Administered 2019-11-23: 1800 [IU]/h via INTRAVENOUS
  Filled 2019-11-21 (×4): qty 250

## 2019-11-21 MED ORDER — HEPARIN (PORCINE) IN NACL 1000-0.9 UT/500ML-% IV SOLN
INTRAVENOUS | Status: AC
  Filled 2019-11-21: qty 1000

## 2019-11-21 MED ORDER — ROSUVASTATIN CALCIUM 20 MG PO TABS
20.0000 mg | ORAL_TABLET | Freq: Every day | ORAL | Status: DC
  Administered 2019-11-21 – 2019-11-25 (×5): 20 mg via ORAL
  Filled 2019-11-21 (×5): qty 1

## 2019-11-21 SURGICAL SUPPLY — 14 items
CATH INFINITI 5 FR IM (CATHETERS) ×1 IMPLANT
CATH INFINITI 5FR MULTPACK ANG (CATHETERS) ×1 IMPLANT
DEVICE RAD COMP TR BAND LRG (VASCULAR PRODUCTS) ×1 IMPLANT
GLIDESHEATH SLEND SS 6F .021 (SHEATH) ×1 IMPLANT
GUIDEWIRE INQWIRE 1.5J.035X260 (WIRE) IMPLANT
HOVERMATT SINGLE USE (MISCELLANEOUS) ×1 IMPLANT
INQWIRE 1.5J .035X260CM (WIRE) ×2
KIT HEART LEFT (KITS) ×2 IMPLANT
PACK CARDIAC CATHETERIZATION (CUSTOM PROCEDURE TRAY) ×2 IMPLANT
SHIELD RADPAD SCOOP 12X17 (MISCELLANEOUS) ×1 IMPLANT
TRANSDUCER W/STOPCOCK (MISCELLANEOUS) ×2 IMPLANT
TUBING ART PRESS 72  MALE/FEM (TUBING) ×1
TUBING ART PRESS 72 MALE/FEM (TUBING) IMPLANT
TUBING CIL FLEX 10 FLL-RA (TUBING) ×2 IMPLANT

## 2019-11-21 NOTE — Progress Notes (Signed)
On call provider notified r/t most recent episode of chest pain, obtaining EKG, and ntg sublingual admin. On call provider returns page. Provider aware of patient history and states that patient is slated for heart cath today.

## 2019-11-21 NOTE — Progress Notes (Signed)
Inpatient Diabetes Program Recommendations  AACE/ADA: New Consensus Statement on Inpatient Glycemic Control (2015)  Target Ranges:  Prepandial:   less than 140 mg/dL      Peak postprandial:   less than 180 mg/dL (1-2 hours)      Critically ill patients:  140 - 180 mg/dL   Lab Results  Component Value Date   GLUCAP 325 (H) 11/21/2019   HGBA1C 8.0 (H) 11/20/2019    Review of Glycemic Control Results for RAMZY, TSOUKALAS (MRN [id number]) as of 11/21/2019 09:52  Ref. Range 11/20/2019 06:19 11/20/2019 11:07 11/20/2019 16:14 11/20/2019 20:59 11/21/2019 06:31  Glucose-Capillary Latest Ref Range: 70 - 99 mg/dL 191 (H) 284 (H) 244 (H) 325 (H) 325 (H)   Diabetes history: DM 2 Outpatient Diabetes medications: Concentrated Humulin R U-500 110 units qam, 55 units qhs Current orders for Inpatient glycemic control:  Novolog 0-20 units tid + hs  Inpatient Diabetes Program Recommendations:   Glucose 300's  - Add Lantus 20-25 units  - Consider ordering Novolog 5 units TID with meals for meal coverage if patient eats at least 50% of meals.  Thanks,  Tama Headings RN, MSN, BC-ADM Inpatient Diabetes Coordinator Team Pager [phone] (8a-5p)

## 2019-11-21 NOTE — Progress Notes (Addendum)
Progress Note  Patient Name: Vernon Briggs Date of Encounter: 11/21/2019  Primary Cardiologist: No primary care provider on file.   Subjective   Had recurrent CP this am.  Currently pain free.    Inpatient Medications    Scheduled Meds: . [START ON 11/22/2019] aspirin  81 mg Oral Daily  . carvedilol  3.125 mg Oral BID WC  . clopidogrel  75 mg Oral Daily  . donepezil  5 mg Oral QHS  . FLUoxetine  40 mg Oral QHS  . insulin aspart  0-20 Units Subcutaneous TID WC  . insulin aspart  0-5 Units Subcutaneous QHS  . magnesium oxide  400 mg Oral BID  . mupirocin ointment  1 application Nasal BID  . rosuvastatin  10 mg Oral QHS  . sodium chloride flush  3 mL Intravenous Q12H   Continuous Infusions: . sodium chloride    . sodium chloride 10 mL/hr at 11/21/19 0430  . heparin 1,400 Units/hr (11/21/19 0101)   PRN Meds: sodium chloride, acetaminophen **OR** acetaminophen, hydrALAZINE, nitroGLYCERIN, ondansetron **OR** ondansetron (ZOFRAN) IV, sodium chloride flush   Vital Signs    Vitals:   11/21/19 0630 11/21/19 0635 11/21/19 0640 11/21/19 0846  BP: 130/64 132/66 136/70 (!) 122/54  Pulse: 69 69 70 71  Resp: '20 19 20 16  ' Temp:      TempSrc:      SpO2: 100% 100% 100% 100%  Weight:      Height:        Intake/Output Summary (Last 24 hours) at 11/21/2019 0910 Last data filed at 11/21/2019 0430 Gross per 24 hour  Intake 1230.92 ml  Output 200 ml  Net 1030.92 ml   Filed Weights   11/19/19 2205 11/21/19 0255  Weight: 129.3 kg 133.3 kg    Telemetry    NSR - Personally Reviewed  ECG     A sensed V paced- Personally Reviewed  Physical Exam   GEN: No acute distress.   Neck: No JVD Cardiac: RRR, no murmurs, rubs, or gallops.  Respiratory: Clear to auscultation bilaterally. GI: Soft, nontender, non-distended  MS: No edema; No deformity. Neuro:  Nonfocal  Psych: Normal affect   Labs    Chemistry Recent Labs  Lab 11/19/19 2207 11/20/19 0336 11/21/19 0329  NA  140 141 137  K 5.2* 4.4 4.7  CL 105 104 101  CO2 '23 28 24  ' GLUCOSE 260* 211* 314*  BUN 49* 45* 46*  CREATININE 1.84* 1.83* 1.86*  CALCIUM 9.0 8.8* 8.8*  PROT  --  5.8*  --   ALBUMIN  --  3.5  --   AST  --  27  --   ALT  --  21  --   ALKPHOS  --  77  --   BILITOT  --  0.9  --   GFRNONAA 35* 35* 35*  GFRAA 41* 41* 40*  ANIONGAP '12 9 12     ' Hematology Recent Labs  Lab 11/19/19 2207 11/20/19 0336 11/21/19 0329  WBC 7.4 6.7 8.4  RBC 3.99* 3.81* 3.85*  HGB 13.5 12.6* 12.8*  HCT 40.2 37.8* 38.6*  MCV 100.8* 99.2 100.3*  MCH 33.8 33.1 33.2  MCHC 33.6 33.3 33.2  RDW 14.1 14.1 14.2  PLT 97* 76* 69*    Cardiac EnzymesNo results for input(s): TROPONINI in the last 168 hours. No results for input(s): TROPIPOC in the last 168 hours.   BNPNo results for input(s): BNP, PROBNP in the last 168 hours.   DDimer  Recent Labs  Lab 11/20/19 0611  DDIMER 0.69*     Radiology    DG Chest 2 View  Result Date: 11/19/2019 CLINICAL DATA:  74 year old male with chest pain. EXAM: CHEST - 2 VIEW COMPARISON:  Chest radiograph dated 04/02/2015. FINDINGS: There is shallow inspiration. Left lung base linear densities, likely related to atelectatic changes and interstitial crowding. Asymmetric edema is less likely. Clinical correlation recommended. No focal consolidation, pleural effusion, or pneumothorax. Stable cardiomegaly. Median sternotomy wires and left pectoral AICD device. No acute osseous pathology. IMPRESSION: Shallow inspiration.  No focal consolidation. Electronically Signed   By: Anner Crete M.D.   On: 11/19/2019 22:36   ECHOCARDIOGRAM COMPLETE  Result Date: 11/20/2019    ECHOCARDIOGRAM REPORT   Patient Name:   Vernon Briggs Date of Exam: 11/20/2019 Medical Rec #:  [id number]       Height:       72.0 in Accession #:    [id number]      Weight:       285.0 lb Date of Birth:  1946/06/26       BSA:          2.477 m Patient Age:    74 years        BP:           133/79 mmHg Patient  Gender: M               HR:           63 bpm. Exam Location:  Inpatient Procedure: 2D Echo, Cardiac Doppler, Color Doppler and Intracardiac            Opacification Agent Indications:    Dyspnea 786.09  History:        Patient has prior history of Echocardiogram examinations, most                 recent 09/09/2014. Cardiomyopathy, CAD, Prior CABG, Stroke and                 TIA, Signs/Symptoms:Chest Pain; Risk Factors:Diabetes and Former                 Smoker. Eleavted troponin.  Sonographer:    Vickie Epley RDCS Referring Phys: [place]  1. Left ventricular ejection fraction, by estimation, is 25 to 30%. The left ventricle has severely decreased function. The left ventricle demonstrates regional wall motion abnormalities (see scoring diagram/findings for description). Left ventricular diastolic function could not be evaluated.  2. Right ventricular systolic function is mildly reduced. The right ventricular size is mildly enlarged. There is mildly elevated pulmonary artery systolic pressure. The estimated right ventricular systolic pressure is 24.4 mmHg.  3. The mitral valve is grossly normal. Mild mitral valve regurgitation. No evidence of mitral stenosis.  4. Tricuspid valve regurgitation is mild to moderate.  5. The aortic valve is tricuspid. Aortic valve regurgitation is not visualized. No aortic stenosis is present.  6. Difficult study with poor windows despite contrast. LVEF severely reduced 25-30%. Mid septum into apex is akinetic consistent with prior infarction. No LV thrombus on contrast imaging. FINDINGS  Left Ventricle: Left ventricular ejection fraction, by estimation, is 25 to 30%. The left ventricle has severely decreased function. The left ventricle demonstrates regional wall motion abnormalities. Definity contrast agent was given IV to delineate the left ventricular endocardial borders. The left ventricular internal cavity size was normal in size. There is no left ventricular  hypertrophy. Left ventricular diastolic function could not be evaluated due to  paced rhythm. Left ventricular diastolic function could not be evaluated.  LV Wall Scoring: The mid and distal anterior septum, apical anterior segment, and apex are akinetic. Right Ventricle: The right ventricular size is mildly enlarged. No increase in right ventricular wall thickness. Right ventricular systolic function is mildly reduced. There is mildly elevated pulmonary artery systolic pressure. The tricuspid regurgitant  velocity is 3.00 m/s, and with an assumed right atrial pressure of 8 mmHg, the estimated right ventricular systolic pressure is 35.5 mmHg. Left Atrium: Left atrial size was normal in size. Right Atrium: Right atrial size was normal in size. Pericardium: There is no evidence of pericardial effusion. Presence of pericardial fat pad. Mitral Valve: The mitral valve is grossly normal. Mild mitral valve regurgitation. No evidence of mitral valve stenosis. Tricuspid Valve: The tricuspid valve is grossly normal. Tricuspid valve regurgitation is mild to moderate. Aortic Valve: The aortic valve is tricuspid. Aortic valve regurgitation is not visualized. No aortic stenosis is present. Pulmonic Valve: The pulmonic valve was grossly normal. Pulmonic valve regurgitation is not visualized. No evidence of pulmonic stenosis. Aorta: The aortic root is normal in size and structure. IAS/Shunts: No atrial level shunt detected by color flow Doppler. Additional Comments: A pacer wire is visualized in the right atrium and right ventricle.  LEFT VENTRICLE PLAX 2D LVIDd:         5.79 cm  Diastology LVIDs:         4.69 cm  LV e' lateral:   6.30 cm/s LV PW:         1.00 cm  LV E/e' lateral: 11.2 LV IVS:        0.98 cm  LV e' medial:    4.65 cm/s LVOT diam:     2.10 cm  LV E/e' medial:  15.1 LV SV:         51 LV SV Index:   20 LVOT Area:     3.46 cm  RIGHT VENTRICLE RV S prime:     4.61 cm/s TAPSE (M-mode): 1.4 cm LEFT ATRIUM              Index       RIGHT ATRIUM           Index LA diam:        4.50 cm 1.82 cm/m  RA Area:     18.80 cm LA Vol (A2C):   43.3 ml 17.48 ml/m RA Volume:   48.90 ml  19.74 ml/m LA Vol (A4C):   48.7 ml 19.66 ml/m LA Biplane Vol: 46.6 ml 18.82 ml/m  AORTIC VALVE LVOT Vmax:   71.30 cm/s LVOT Vmean:  42.600 cm/s LVOT VTI:    0.146 m  AORTA Ao Root diam: 3.40 cm MITRAL VALVE               TRICUSPID VALVE MV Area (PHT): 4.60 cm    TR Peak grad:   36.0 mmHg MV Decel Time: 165 msec    TR Vmax:        300.00 cm/s MR Peak grad: 91.8 mmHg MR Vmax:      479.00 cm/s  SHUNTS MV E velocity: 70.30 cm/s  Systemic VTI:  0.15 m MV A velocity: 47.60 cm/s  Systemic Diam: 2.10 cm MV E/A ratio:  1.48 Eleonore Chiquito MD Electronically signed by Eleonore Chiquito MD Signature Date/Time: 11/20/2019/3:14:37 PM    Final     Cardiac Studies   Relevant CV Studies:  Echocardiogram 09/09/2014:  Study Conclusions   - Left  ventricle: The cavity size was normal. Wall thickness was  increased in a pattern of mild LVH. Systolic function was  moderately reduced. The estimated ejection fraction was in the  range of 35% to 40%. Akinesis of the midanteroseptal and anterior  myocardium. Doppler parameters are consistent with abnormal left  ventricular relaxation (grade 1 diastolic dysfunction).  Indeterminate mean left atrial filling pressures.  - Aortic valve: There was mild regurgitation.  - Mitral valve: Calcified annulus.  - Left atrium: The atrium was mildly dilated.   Impressions:   - Little change on direct comparison with study from February 2014.   TTE 10/21/12 Study Conclusions  - Left ventricle: The cavity size was normal. There was moderate concentric hypertrophy. Systolic function was mildly to moderately reduced. The estimated ejection fraction was in the range of 40% to 45%. There is akinesis of the mid-distalanteroseptal myocardium. - Mitral valve: Mild regurgitation. - Left atrium: The atrium was  mildly dilated. - Tricuspid valve: Moderate regurgitation. - Pulmonary arteries: PA peak pressure: 56m Hg (S).  LHC 09/14/2014:  Coronary Anatomy:  Dominance: Unknown, likely right  Left Main: Large-caliber vessel with distal 30% stenosis. Bifurcates into the nondominant circumflex and LAD. The apparent ramus medius was not visualized. LGYK:ZLDJTTSV70% stenosis before giving off a small moderate caliber diagonal branch. The vessels and 100 and occluded.  D1:Small moderate caliber vessel that covers the proximal anterolateral wall. Minimal luminal irregularities.  LIMA-LAD: Widely patent, tortuous graft to the mid LAD. It perfuses down around the inferolateral apex with minimal luminal irregularities distally. Left Circumflex:Nondominant, large caliber vessel that has a proximal and mid OM both of which are subtotal totally occluded in the mid vessels. The main circumflex terminates as a lateral/inferolateral OM branch. There are at least 2 stents in the proximal to mid section. Just beyond the distal stent there is a focal irregular 80% stenosis at the takeoff on the small marginal branches. The remainder the vessel is relatively free of disease and trifurcates into 3 branches distally along the inferolateral wall.  SVG-OM: Ostial occluded Ramus intermedius:Not seen on native left angiography.  SVG-RI: Ostial occluded  RCA: Large-caliber, likely dominant vessel that is 100 and occluded in the mid vessel. There are left-right collaterals from the circumflex AV groove branch that appeared to fill part of the posterior lateral system. Some left to right collaterals from the graft LAD perfuses part of the PDA distribution.  SVG-distal RCA: Ostial occluded with stents visible.  After reviewing the initial angiography, the culprit lesion was thought to be 80% irregular lesion in the mid circumflex. Preparation were made to proceed with PCI on this lesion.  Percutaneous Coronary  Intervention: Sheath exchanged for 6 Fr Guide: 6 Fr XB LAD 3.5Guidewire: BMW Predilation Balloon: Trek 2.5 mm x 12 mm;  ? 16 Atm x 30 Sec, Stent: Promus Premier DES 3.5 mm x 16 mm;  ? 16 Atm x 30 Sec, postdilated to 18 Atm x 30Sec - Final diameter 3.8 MM Post-dilation Balloon:[place] Trek 4.0 mm x 8 m;  ? 16 Atm x 45 Sec, Final Diameter: 4.0 mm   Patient Profile     75y.o. male with a hx of CAD status post CABG x4 12/21/1997 ([facility] LIMA to LAD, SVG to dRCA, SVG to OM1, SVG to RI) with PCI x6 in approximately 2011 ([facility], DM 2, hypertension, hyperlipidemia, obesity, PAF on Eliquis, prior left frontal CVA and ICM  s/p ICD in 2013 who is being seen  for the evaluation of chest pain at the request of Dr. Hal Hope.   Assessment & Plan    1.  Chest pain/NSTEMI/CAD -concerning for ACS -hstrop mildly elevated [phone]) -last PCI was in 2016 and CP similar to then -he has significant hx of CAD with last cath in 2016 with SVGs occluded and severe native CAD and patent LIMA>LAD s/p PCI of the LCx -suspect that he has had progression of his underlying CAD -ACE I and diuretics on hold due to elevated creatinine -creatinine this am 1.86 -had recurrent CP this am - currently pain free - 12 Lead EKG a sensed V paced -he is on Eliquis but has not had any doses in 36 hours -IV heparin per pharmacy -continue ASA, Plavix, BB and statin -plan for LHC today - discussed at length with patient the risk of worsening renal function due to CKD and possible contrast nephropathy after cath.  We discussed the small possibility of need for transient HD as well.  Will plan cors and LIMA only (SVG are known to be occluded so will only need to do LIMA>LAD). If he needs PCI then would do PCI on Monday and give kidneys a chance to recover over weekend >> discussed with Dr. Angelena Form this am.  -he has chronic thrombocytopenia but platelet  count stable at 69K compared to prior years (72K in 2016)  2.  CAD -s/p remote CABG 1999 -s/p PCI in LCx 2016 -cath in 2016 with patent LIMA>LAD, occluded SVGs and severe native CAD -continue Crestor, BB, ASA, Plavix 41m daily -LHC  today  3.  PAF -maintaining NSR  -Eliquis on hold for cath  -last dose of Eliquis was pm 3/17 -IV Heparin stopped due to bleeding from IV site  4.  HLD -LDL goal < 70 -this admit LDL 22 but TAGS 379 -increase crestor to 289mdaily and add Vascepa 2gm BID -repeat FLP in 6 weeks  5.  DM2 -per TRH -HbA1C 8 -continue SS Insulin  6.  HTN -BP controlled -on carvedilol 3.12515mID, Hydralazine 68m63mD, HCTZ 25mg78mly, Lisinopril 40mg 35my at home -ACE I, hydralazine and diuretics on hold to allow adequate BP for renal perfusion  7.  Chronic combined systolic/diastolic CHF -s/p AICD -appears euvolemic on exam except for some mild Vernon edema -continue carvedilol -ACE I and diuretics on hold for cath -hydralazine held for soft BP  8.  CKD stage 3b -creatinine 1.86 today (1.32 in 2018)  9.  Thrombocytopenia -chronic problem -plt count 69K today which is around his baseline (72K in 2016)  I have spent a total of 35 minutes with patient reviewing hospital notes, discussion with Dr. McAlhaAngelena Formding cath with low platelets and CKD , telemetry, EKGs, labs and examining patient as well as establishing an assessment and plan that was discussed with the patient.  > 50% of time was spent in direct patient care.       For questions or updates, please contact [facility] consult [email or web address] for contact info under Cardiology/STEMI.      Signed, Semaj Coburn Fransico Him3/19/2021, 9:10 AM

## 2019-11-21 NOTE — Progress Notes (Signed)
Notified provider the patient has WOB and SOB. He has accessory muscle usage and tachypnea. RT  Placed pt on CPAP pressure of 11 with 4L 02 bleed in. The provider may need to come and assess patient when he has time.

## 2019-11-21 NOTE — Interval H&P Note (Signed)
History and Physical Interval Note:  11/21/2019 12:52 PM  Vernon Briggs  has presented today for surgery, with the diagnosis of nonstemi.  The various methods of treatment have been discussed with the patient and family. After consideration of risks, benefits and other options for treatment, the patient has consented to  Procedure(s): LEFT HEART CATH AND CORS/GRAFTS ANGIOGRAPHY (N/A) as a surgical intervention.  The patient's history has been reviewed, patient examined, no change in status, stable for surgery.  I have reviewed the patient's chart and labs.  Questions were answered to the patient's satisfaction.    Cath Lab Visit (complete for each Cath Lab visit)  Clinical Evaluation Leading to the Procedure:   ACS: Yes.    Non-ACS:    Anginal Classification: CCS III  Anti-ischemic medical therapy: Maximal Therapy (2 or more classes of medications)  Non-Invasive Test Results: No non-invasive testing performed  Prior CABG: Previous CABG        Lauree Chandler

## 2019-11-21 NOTE — Progress Notes (Signed)
Upon first assessment RN noticed scant bloody drainage under tegraderm dressing located in Right A/C.  RN changed dressing and continued to monitor site for several hours with no more active bleeding.  RN went into room while NT taking vital signs on right arm and then noticed both iv sites had fresh bloody drainage under the tegraderm dressing.  RN called pharmacy and stopped heparin infusion and notified charge nurse.  IV sites were both in bendable appendages (A/C and wrist areas) so those iv's were removed from patient's arm with minimal bleeding and slight pressure needed to stop bleeding at this time.  IV team was consulted and new iv is being placed at this time.  Will restart heparin gtt at 1:00am at rate of 14 and continue to monitor patient for active bleeding.

## 2019-11-21 NOTE — Progress Notes (Addendum)
[facility]  PROGRESS NOTE  Vernon Briggs [id number] DOB: 09-Jan-1946 DOA: 11/19/2019 PCP: [name], MD   Patient admitted this morning by Dr. Hal Hope.  See detailed H&P.   Brief HPI:   74 year old male with a history of CAD s/p CABG and stenting in 2016, CKD stage III, diabetes mellitus type 2, hypertension, paroxysmal atrial fibrillation, ischemic cardiomyopathy status post AICD presented to the ED with complaints of chest pain.  Patient stated that he was having chest pain and shortness of breath for past 1 month.  In the ED EKG showed normal sinus rhythm with IVCD.  Chest x-ray was unremarkable.  COVID-19 test was negative.  Creatinine was 1.8 which increased from baseline around 1.6.  Patient mated with chest pain with concern for ACS.  Cardiology was consulted.    Subjective   Patient seen and examined, denies any chest pain at this time.  Plan for cardiac cath this morning   Assessment/Plan:     1. Chest pain-patient has history of CAD s/p CABG, cardiology was consulted.  Patient underwent diagnostic cardiac cath.  Found to have stenosis of left circumflex.  No stenting was performed due to the renal insufficiency.  Patient will need IV hydration over the weekend and repeat cardiac cath with stenting on Monday.  Continue aspirin, Plavix, heparin.  2. Diabetes mellitus type 2-blood glucose is mildly elevated, patient takes concentrated Humulin R 55 to 110 units at home.  He has been started on resistant sliding scale.  Blood glucose is elevated.  Will start Lantus 10 units subcu daily.  3. Hypertension-continue Coreg, blood pressure is well controlled.  Continue IV as needed hydralazine.  4. CKD stage III-patient baseline creatinine is around 1.3-1.5.  He presented with creatinine of 1.8.  Today creatinine is 1.86.  Patient started on gentle IV hydration with normal saline at 50 ml per hour.  5. Ischemic cardiomyopathy s/p AICD-continue aspirin, Plavix,  heparin, Crestor.  6. Paroxysmal atrial fibrillation-continue Coreg, Coumadin.  7. Thrombocytopenia- chronic. Today platelet count is 69,000.    SpO2: 100 % O2 Flow Rate (L/min): 4 L/min   COVID-19 Labs  Recent Labs    11/20/19 0611  DDIMER 0.69*    Lab Results  Component Value Date   SARSCOV2NAA NEGATIVE 11/19/2019     CBG: Recent Labs  Lab 11/20/19 1107 11/20/19 1614 11/20/19 2059 11/21/19 0631 11/21/19 1208  GLUCAP 284* 244* 325* 325* 258*    CBC: Recent Labs  Lab 11/19/19 2207 11/20/19 0336 11/21/19 0329  WBC 7.4 6.7 8.4  NEUTROABS  --  4.6  --   HGB 13.5 12.6* 12.8*  HCT 40.2 37.8* 38.6*  MCV 100.8* 99.2 100.3*  PLT 97* 76* 69*    Basic Metabolic Panel: Recent Labs  Lab 11/19/19 2207 11/20/19 0336 11/21/19 0329  NA 140 141 137  K 5.2* 4.4 4.7  CL 105 104 101  CO2 23 28 24   GLUCOSE 260* 211* 314*  BUN 49* 45* 46*  CREATININE 1.84* 1.83* 1.86*  CALCIUM 9.0 8.8* 8.8*     Liver Function Tests: Recent Labs  Lab 11/20/19 0336  AST 27  ALT 21  ALKPHOS 77  BILITOT 0.9  PROT 5.8*  ALBUMIN 3.5        DVT prophylaxis: Heparin  Code Status: Full code  Family Communication: No family at bedside  Disposition Plan: Patient admitted with chest pain, concern for ACS.  Underwent cardiac cath.  Barrier to discharge-he will need repeat procedure, cardiac cath with stenting on Monday  after hydration over the weekend.         Scheduled medications:  . [START ON 11/22/2019] aspirin  81 mg Oral Daily  . carvedilol  3.125 mg Oral BID WC  . clopidogrel  75 mg Oral Daily  . donepezil  5 mg Oral QHS  . FLUoxetine  40 mg Oral QHS  . furosemide  40 mg Oral Daily  . icosapent Ethyl  2 g Oral BID  . insulin aspart  0-20 Units Subcutaneous TID WC  . insulin aspart  0-5 Units Subcutaneous QHS  . magnesium oxide  400 mg Oral BID  . mupirocin ointment  1 application Nasal BID  . rosuvastatin  20 mg Oral QHS  . sodium chloride flush  3 mL  Intravenous Q12H  . sodium chloride flush  3 mL Intravenous Q12H    Consultants:  Cardiology  Procedures:  Diagnostic cardiac cath  Antibiotics:   Anti-infectives (From admission, onward)   None       Objective   Vitals:   11/21/19 1411 11/21/19 1417 11/21/19 1432 11/21/19 1447  BP: (!) 150/75 (!) 133/96 (!) 152/76 (!) 164/80  Pulse: 67 69 68 68  Resp: 14     Temp: 98.2 F (36.8 C)     TempSrc: Oral     SpO2: 100% 100% 100% 100%  Weight:      Height:        Intake/Output Summary (Last 24 hours) at 11/21/2019 1501 Last data filed at 11/21/2019 1036 Gross per 24 hour  Intake 650.92 ml  Output 550 ml  Net 100.92 ml    03/17 1901 - 03/19 0700 In: 1830.9 [P.O.:1640; I.V.:190.9] Out: 600 [Urine:600]  Filed Weights   11/19/19 2205 11/21/19 0255  Weight: 129.3 kg 133.3 kg    Physical Examination:    General-appears in no acute distress  Heart-S1-S2, regular, no murmur auscultated  Lungs-clear to auscultation bilaterally, no wheezing or crackles auscultated  Abdomen-soft, nontender, no organomegaly  Extremities-trace edema in the lower extremities  Neuro-alert, oriented x3, no focal deficit noted    Data Reviewed:   Recent Results (from the past 240 hour(s))  Respiratory Panel by RT PCR (Flu A&B, Covid) - Nasopharyngeal Swab     Status: None   Collection Time: 11/19/19 11:45 PM   Specimen: Nasopharyngeal Swab  Result Value Ref Range Status   SARS Coronavirus 2 by RT PCR NEGATIVE NEGATIVE Final    Comment: (NOTE) SARS-CoV-2 target nucleic acids are NOT DETECTED. The SARS-CoV-2 RNA is generally detectable in upper respiratoy specimens during the acute phase of infection. The lowest concentration of SARS-CoV-2 viral copies this assay can detect is 131 copies/mL. A negative result does not preclude SARS-Cov-2 infection and should not be used as the sole basis for treatment or other patient management decisions. A negative result may occur with   improper specimen collection/handling, submission of specimen other than nasopharyngeal swab, presence of viral mutation(s) within the areas targeted by this assay, and inadequate number of viral copies (<131 copies/mL). A negative result must be combined with clinical observations, patient history, and epidemiological information. The expected result is Negative. Fact Sheet for Patients:  PinkCheek.be Fact Sheet for Healthcare Providers:  GravelBags.it This test is not yet ap proved or cleared by the [place] FDA and  has been authorized for detection and/or diagnosis of SARS-CoV-2 by FDA under an Emergency Use Authorization (EUA). This EUA will remain  in effect (meaning this test can be used) for the duration of the COVID-19  declaration under Section 564(b)(1) of the Act, 21 U.S.C. section 360bbb-3(b)(1), unless the authorization is terminated or revoked sooner.    Influenza A by PCR NEGATIVE NEGATIVE Final   Influenza B by PCR NEGATIVE NEGATIVE Final    Comment: (NOTE) The Xpert Xpress SARS-CoV-2/FLU/RSV assay is intended as an aid in  the diagnosis of influenza from Nasopharyngeal swab specimens and  should not be used as a sole basis for treatment. Nasal washings and  aspirates are unacceptable for Xpert Xpress SARS-CoV-2/FLU/RSV  testing. Fact Sheet for Patients: PinkCheek.be Fact Sheet for Healthcare Providers: GravelBags.it This test is not yet approved or cleared by the [place] FDA and  has been authorized for detection and/or diagnosis of SARS-CoV-2 by  FDA under an Emergency Use Authorization (EUA). This EUA will remain  in effect (meaning this test can be used) for the duration of the  Covid-19 declaration under Section 564(b)(1) of the Act, 21  U.S.C. section 360bbb-3(b)(1), unless the authorization is  terminated or revoked. Performed at  [facility], [place] [address], [place], [place] [address]   Surgical PCR screen     Status: None   Collection Time: 11/21/19  2:39 AM   Specimen: Nasal Mucosa; Nasal Swab  Result Value Ref Range Status   MRSA, PCR NEGATIVE NEGATIVE Final   Staphylococcus aureus NEGATIVE NEGATIVE Final    Comment: (NOTE) The Xpert SA Assay (FDA approved for NASAL specimens in patients 67 years of age and older), is one component of a comprehensive surveillance program. It is not intended to diagnose infection nor to guide or monitor treatment. Performed at [facility], [place] [address] [place], [place] [address]      Studies:  DG Chest 2 View  Result Date: 11/19/2019 CLINICAL DATA:  74 year old male with chest pain. EXAM: CHEST - 2 VIEW COMPARISON:  Chest radiograph dated 04/02/2015. FINDINGS: There is shallow inspiration. Left lung base linear densities, likely related to atelectatic changes and interstitial crowding. Asymmetric edema is less likely. Clinical correlation recommended. No focal consolidation, pleural effusion, or pneumothorax. Stable cardiomegaly. Median sternotomy wires and left pectoral AICD device. No acute osseous pathology. IMPRESSION: Shallow inspiration.  No focal consolidation. Electronically Signed   By: Anner Crete M.D.   On: 11/19/2019 22:36   CARDIAC CATHETERIZATION  Result Date: 11/21/2019  Prox RCA to Mid RCA lesion is 100% stenosed.  Prox Cx to Dist Cx lesion is 10% stenosed.  Mid LM lesion is 90% stenosed.  Ost LAD to Prox LAD lesion is 100% stenosed.  LIMA graft was visualized by angiography and is normal in caliber.  SVG graft was not visualized.  Origin to Prox Graft lesion is 100% stenosed.  SVG graft was not visualized.  Origin to Prox Graft lesion is 100% stenosed.  SVG graft was not visualized.  Origin to Prox Graft lesion is 100% stenosed.  1. Severe left main stenosis. The left main supplies the Circumflex only as the LAD is occluded. 2.  Chronic occlusion of the proximal LAD. The mid and distal LAD fills from the patent LIMA graft 3. Chronic occlusion of intermediate branch. Chronic occlusion of the SVG to intermediate branch 4. Chronic occlusion SVG to OM 5. Chronic occlusion mid RCA. The RCA fills from left to right collaterals supplied by the LAD through the LIMA. The SVG to the RCA is chronically occluded. Recommendations: He has had progression of his left main disease. This is felt to be his culprit vessel. His left main supplies the  Circumflex artery. The LAD is chronically occluded and fills from the patent LIMA graft. The RCA fills from left to right collaterals. I think he will need PCI of the left main lesion which is essentially a proximal Circumflex stenosis since the LAD is occluded and protected by the graft. Given his CKD, this was a diagnostic only cath. Will hydrate over the weekend. Given elevated LVEDP, will gently diurese. Continue ASA and Plavix. Resume IV heparin 6 hours post sheath pull. Will plan PCI/stenting of the left main artery on Monday. Will review films with the IC team. I do not think atherectomy is indicated prior to stenting.   ECHOCARDIOGRAM COMPLETE  Result Date: 11/20/2019    ECHOCARDIOGRAM REPORT   Patient Name:   Vernon Briggs Date of Exam: 11/20/2019 Medical Rec #:  [id number]       Height:       72.0 in Accession #:    [id number]      Weight:       285.0 lb Date of Birth:  [birth year]       BSA:          2.477 m Patient Age:    37 years        BP:           133/79 mmHg Patient Gender: M               HR:           63 bpm. Exam Location:  Inpatient Procedure: 2D Echo, Cardiac Doppler, Color Doppler and Intracardiac            Opacification Agent Indications:    Dyspnea 786.09  History:        Patient has prior history of Echocardiogram examinations, most                 recent 09/09/2014. Cardiomyopathy, CAD, Prior CABG, Stroke and                 TIA, Signs/Symptoms:Chest Pain; Risk Factors:Diabetes and  Former                 Smoker. Eleavted troponin.  Sonographer:    Vickie Epley RDCS Referring Phys: [place]  1. Left ventricular ejection fraction, by estimation, is 25 to 30%. The left ventricle has severely decreased function. The left ventricle demonstrates regional wall motion abnormalities (see scoring diagram/findings for description). Left ventricular diastolic function could not be evaluated.  2. Right ventricular systolic function is mildly reduced. The right ventricular size is mildly enlarged. There is mildly elevated pulmonary artery systolic pressure. The estimated right ventricular systolic pressure is 123XX123 mmHg.  3. The mitral valve is grossly normal. Mild mitral valve regurgitation. No evidence of mitral stenosis.  4. Tricuspid valve regurgitation is mild to moderate.  5. The aortic valve is tricuspid. Aortic valve regurgitation is not visualized. No aortic stenosis is present.  6. Difficult study with poor windows despite contrast. LVEF severely reduced 25-30%. Mid septum into apex is akinetic consistent with prior infarction. No LV thrombus on contrast imaging. FINDINGS  Left Ventricle: Left ventricular ejection fraction, by estimation, is 25 to 30%. The left ventricle has severely decreased function. The left ventricle demonstrates regional wall motion abnormalities. Definity contrast agent was given IV to delineate the left ventricular endocardial borders. The left ventricular internal cavity size was normal in size. There is no left ventricular hypertrophy. Left ventricular diastolic function could not be evaluated due to paced  rhythm. Left ventricular diastolic function could not be evaluated.  LV Wall Scoring: The mid and distal anterior septum, apical anterior segment, and apex are akinetic. Right Ventricle: The right ventricular size is mildly enlarged. No increase in right ventricular wall thickness. Right ventricular systolic function is mildly reduced. There is  mildly elevated pulmonary artery systolic pressure. The tricuspid regurgitant  velocity is 3.00 m/s, and with an assumed right atrial pressure of 8 mmHg, the estimated right ventricular systolic pressure is 123XX123 mmHg. Left Atrium: Left atrial size was normal in size. Right Atrium: Right atrial size was normal in size. Pericardium: There is no evidence of pericardial effusion. Presence of pericardial fat pad. Mitral Valve: The mitral valve is grossly normal. Mild mitral valve regurgitation. No evidence of mitral valve stenosis. Tricuspid Valve: The tricuspid valve is grossly normal. Tricuspid valve regurgitation is mild to moderate. Aortic Valve: The aortic valve is tricuspid. Aortic valve regurgitation is not visualized. No aortic stenosis is present. Pulmonic Valve: The pulmonic valve was grossly normal. Pulmonic valve regurgitation is not visualized. No evidence of pulmonic stenosis. Aorta: The aortic root is normal in size and structure. IAS/Shunts: No atrial level shunt detected by color flow Doppler. Additional Comments: A pacer wire is visualized in the right atrium and right ventricle.  LEFT VENTRICLE PLAX 2D LVIDd:         5.79 cm  Diastology LVIDs:         4.69 cm  LV e' lateral:   6.30 cm/s LV PW:         1.00 cm  LV E/e' lateral: 11.2 LV IVS:        0.98 cm  LV e' medial:    4.65 cm/s LVOT diam:     2.10 cm  LV E/e' medial:  15.1 LV SV:         51 LV SV Index:   20 LVOT Area:     3.46 cm  RIGHT VENTRICLE RV S prime:     4.61 cm/s TAPSE (M-mode): 1.4 cm LEFT ATRIUM             Index       RIGHT ATRIUM           Index LA diam:        4.50 cm 1.82 cm/m  RA Area:     18.80 cm LA Vol (A2C):   43.3 ml 17.48 ml/m RA Volume:   48.90 ml  19.74 ml/m LA Vol (A4C):   48.7 ml 19.66 ml/m LA Biplane Vol: 46.6 ml 18.82 ml/m  AORTIC VALVE LVOT Vmax:   71.30 cm/s LVOT Vmean:  42.600 cm/s LVOT VTI:    0.146 m  AORTA Ao Root diam: 3.40 cm MITRAL VALVE               TRICUSPID VALVE MV Area (PHT): 4.60 cm    TR Peak  grad:   36.0 mmHg MV Decel Time: 165 msec    TR Vmax:        300.00 cm/s MR Peak grad: 91.8 mmHg MR Vmax:      479.00 cm/s  SHUNTS MV E velocity: 70.30 cm/s  Systemic VTI:  0.15 m MV A velocity: 47.60 cm/s  Systemic Diam: 2.10 cm MV E/A ratio:  1.48 Eleonore Chiquito MD Electronically signed by Eleonore Chiquito MD Signature Date/Time: 11/20/2019/3:14:37 PM    Final      Admission status: The appropriate admission status for this patient is INPATIENT. Inpatient status is judged to  be reasonable and necessary in order to provide the required intensity of service to ensure the patient's safety. The patient's presenting symptoms, physical exam findings, and initial radiographic and laboratory data in the context of their chronic comorbidities is felt to place them at high risk for further clinical deterioration. Furthermore, it is not anticipated that the patient will be medically stable for discharge from the hospital within 2 midnights of admission. The following factors support the admission status of inpatient.    The patient's presenting symptoms include chest pain The worrisome physical exam findings include  The initial radiographic and laboratory data are worrisome because of elevated troponin, left circumflex stenosis seen on cardiac cath The chronic co-morbidities include obesity, hypertension, CAD    * I certify that at the point of admission it is my clinical judgment that the patient will require inpatient hospital care spanning beyond 2 midnights from the point of admission due to high intensity of service, high risk for further deterioration and high frequency of surveillance required.Oswald Hillock   [facility] If 7PM-7AM, please contact night-coverage at [email or web address], Office  [phone]   11/21/2019, 3:01 PM  LOS: 1 day

## 2019-11-21 NOTE — Progress Notes (Signed)
[facility] for heparin Indication: chest pain/ACS, atrial fibrillation  Allergies  Allergen Reactions  . Tape Other (See Comments)    Causes skin irritation    Patient Measurements: Height: 6' (182.9 cm) Weight: 293 lb 12.8 oz (133.3 kg) IBW/kg (Calculated) : 77.6 Heparin Dosing Weight: 106kg  Vital Signs: Temp: 98.2 F (36.8 C) (03/19 1411) Temp Source: Oral (03/19 1411) BP: 133/96 (03/19 1417) Pulse Rate: 69 (03/19 1417)  Labs: Recent Labs    11/19/19 2207 11/19/19 2207 11/20/19 0007 11/20/19 0336 11/20/19 0611 11/20/19 2005 11/21/19 0329 11/21/19 0908  HGB 13.5   < >  --  12.6*  --   --  12.8*  --   HCT 40.2  --   --  37.8*  --   --  38.6*  --   PLT 97*  --   --  76*  --   --  69*  --   APTT  --   --   --   --   --  59*  --  52*  LABPROT 16.4*  --   --  18.1*  --   --   --   --   INR 1.3*  --   --  1.5*  --   --   --   --   HEPARINUNFRC  --   --   --   --   --  1.40*  --  0.99*  CREATININE 1.84*  --   --  1.83*  --   --  1.86*  --   TROPONINIHS 55*   < > 95* 314* 485*  --   --   --    < > = values in this interval not displayed.    Estimated Creatinine Clearance: 48.5 mL/min (A) (by C-G formula based on SCr of 1.86 mg/dL (H)).   Medical History: Past Medical History:  Diagnosis Date  . AICD (automatic cardioverter/defibrillator) present   . Anxiety   . Arthritis    "occasionally in my right hand" (04/02/2015)  . CAD in native artery 1999  . Cancer ([facility])   . CHF (congestive heart failure) ([facility])   . Depression   . GERD (gastroesophageal reflux disease)    "occasionally" (04/02/2015)  . History of hiatal hernia   . Hypercholesterolemia   . Hypertension   . Myocardial infarction ([facility]) "several"  . OSA on CPAP   . PAF (paroxysmal atrial fibrillation) ([facility])   . PTSD (post-traumatic stress disorder)   . Stroke ([facility])    "I've had 2; my right leg & arm slightly weaker since" (04/02/2015)  . Type II diabetes mellitus  ([facility])     Assessment: 30 YOM presenting with chest pain with hx CAD s/p CABG, hx of PAF previously on warfarin now on Eliquis (last dose 3/17), chronic thrombocytopenia plts at 76.    Overnight bleeding from 2 IV sites below BP cuff, bleeding stopped with cuff removal and pressure, gtt restarted @0100  on 1400 units/hr.  This AM heparin level remains falsely elevated, aPTT subtherapeutic at 52 seconds.  Chronic anemia/thrombocytopenia stable, no more reports of bleeding.   Now s/p cath, for heparin gtt restart 6 hours post sheath removal   Goal of Therapy:  Heparin level 0.3-0.7 units/ml aPTT 66-102 seconds Monitor platelets by anticoagulation protocol: Yes   Plan:  Restart heparin gtt at 1500 units/hr @2000  today F/u aPTT/heparin level with AM labs Plan for switch back to Eliquis  Bertis Ruddy, PharmD Clinical Pharmacist Please  check AMION for all [facility] numbers 11/21/2019 2:32 PM

## 2019-11-21 NOTE — Progress Notes (Signed)
Patient returned to unit form cathlab. 

## 2019-11-21 NOTE — Progress Notes (Signed)
Patient refused CPAP for the night.  Patient expressed he would rather wear oxygen.

## 2019-11-21 NOTE — H&P (View-Only) (Signed)
Progress Note  Patient Name: Vernon Briggs Date of Encounter: 11/21/2019  Primary Cardiologist: No primary care provider on file.   Subjective   Had recurrent CP this am.  Currently pain free.    Inpatient Medications    Scheduled Meds:  [START ON 11/22/2019] aspirin  81 mg Oral Daily   carvedilol  3.125 mg Oral BID WC   clopidogrel  75 mg Oral Daily   donepezil  5 mg Oral QHS   FLUoxetine  40 mg Oral QHS   insulin aspart  0-20 Units Subcutaneous TID WC   insulin aspart  0-5 Units Subcutaneous QHS   magnesium oxide  400 mg Oral BID   mupirocin ointment  1 application Nasal BID   rosuvastatin  10 mg Oral QHS   sodium chloride flush  3 mL Intravenous Q12H   Continuous Infusions:  sodium chloride     sodium chloride 10 mL/hr at 11/21/19 0430   heparin 1,400 Units/hr (11/21/19 0101)   PRN Meds: sodium chloride, acetaminophen **OR** acetaminophen, hydrALAZINE, nitroGLYCERIN, ondansetron **OR** ondansetron (ZOFRAN) IV, sodium chloride flush   Vital Signs    Vitals:   11/21/19 0630 11/21/19 0635 11/21/19 0640 11/21/19 0846  BP: 130/64 132/66 136/70 (!) 122/54  Pulse: 69 69 70 71  Resp: '20 19 20 16  ' Temp:      TempSrc:      SpO2: 100% 100% 100% 100%  Weight:      Height:        Intake/Output Summary (Last 24 hours) at 11/21/2019 0910 Last data filed at 11/21/2019 0430 Gross per 24 hour  Intake 1230.92 ml  Output 200 ml  Net 1030.92 ml   Filed Weights   11/19/19 2205 11/21/19 0255  Weight: 129.3 kg 133.3 kg    Telemetry    NSR - Personally Reviewed  ECG     A sensed V paced- Personally Reviewed  Physical Exam   GEN: No acute distress.   Neck: No JVD Cardiac: RRR, no murmurs, rubs, or gallops.  Respiratory: Clear to auscultation bilaterally. GI: Soft, nontender, non-distended  MS: No edema; No deformity. Neuro:  Nonfocal  Psych: Normal affect   Labs    Chemistry Recent Labs  Lab 11/19/19 2207 11/20/19 0336 11/21/19 0329  NA  140 141 137  K 5.2* 4.4 4.7  CL 105 104 101  CO2 '23 28 24  ' GLUCOSE 260* 211* 314*  BUN 49* 45* 46*  CREATININE 1.84* 1.83* 1.86*  CALCIUM 9.0 8.8* 8.8*  PROT  --  5.8*  --   ALBUMIN  --  3.5  --   AST  --  27  --   ALT  --  21  --   ALKPHOS  --  77  --   BILITOT  --  0.9  --   GFRNONAA 35* 35* 35*  GFRAA 41* 41* 40*  ANIONGAP '12 9 12     ' Hematology Recent Labs  Lab 11/19/19 2207 11/20/19 0336 11/21/19 0329  WBC 7.4 6.7 8.4  RBC 3.99* 3.81* 3.85*  HGB 13.5 12.6* 12.8*  HCT 40.2 37.8* 38.6*  MCV 100.8* 99.2 100.3*  MCH 33.8 33.1 33.2  MCHC 33.6 33.3 33.2  RDW 14.1 14.1 14.2  PLT 97* 76* 69*    Cardiac EnzymesNo results for input(s): TROPONINI in the last 168 hours. No results for input(s): TROPIPOC in the last 168 hours.   BNPNo results for input(s): BNP, PROBNP in the last 168 hours.   DDimer  Recent Labs  Lab 11/20/19 0611  DDIMER 0.69*     Radiology    DG Chest 2 View  Result Date: 11/19/2019 CLINICAL DATA:  74 year old male with chest pain. EXAM: CHEST - 2 VIEW COMPARISON:  Chest radiograph dated 04/02/2015. FINDINGS: There is shallow inspiration. Left lung base linear densities, likely related to atelectatic changes and interstitial crowding. Asymmetric edema is less likely. Clinical correlation recommended. No focal consolidation, pleural effusion, or pneumothorax. Stable cardiomegaly. Median sternotomy wires and left pectoral AICD device. No acute osseous pathology. IMPRESSION: Shallow inspiration.  No focal consolidation. Electronically Signed   By: Anner Crete M.D.   On: 11/19/2019 22:36   ECHOCARDIOGRAM COMPLETE  Result Date: 11/20/2019    ECHOCARDIOGRAM REPORT   Patient Name:   Vernon Briggs Date of Exam: 11/20/2019 Medical Rec #:  [id number]       Height:       72.0 in Accession #:    [id number]      Weight:       285.0 lb Date of Birth:  Nov 10, 1945       BSA:          2.477 m Patient Age:    74 years        BP:           133/79 mmHg Patient  Gender: M               HR:           63 bpm. Exam Location:  Inpatient Procedure: 2D Echo, Cardiac Doppler, Color Doppler and Intracardiac            Opacification Agent Indications:    Dyspnea 786.09  History:        Patient has prior history of Echocardiogram examinations, most                 recent 09/09/2014. Cardiomyopathy, CAD, Prior CABG, Stroke and                 TIA, Signs/Symptoms:Chest Pain; Risk Factors:Diabetes and Former                 Smoker. Eleavted troponin.  Sonographer:    Vickie Epley RDCS Referring Phys: [place]  1. Left ventricular ejection fraction, by estimation, is 25 to 30%. The left ventricle has severely decreased function. The left ventricle demonstrates regional wall motion abnormalities (see scoring diagram/findings for description). Left ventricular diastolic function could not be evaluated.  2. Right ventricular systolic function is mildly reduced. The right ventricular size is mildly enlarged. There is mildly elevated pulmonary artery systolic pressure. The estimated right ventricular systolic pressure is 79.8 mmHg.  3. The mitral valve is grossly normal. Mild mitral valve regurgitation. No evidence of mitral stenosis.  4. Tricuspid valve regurgitation is mild to moderate.  5. The aortic valve is tricuspid. Aortic valve regurgitation is not visualized. No aortic stenosis is present.  6. Difficult study with poor windows despite contrast. LVEF severely reduced 25-30%. Mid septum into apex is akinetic consistent with prior infarction. No LV thrombus on contrast imaging. FINDINGS  Left Ventricle: Left ventricular ejection fraction, by estimation, is 25 to 30%. The left ventricle has severely decreased function. The left ventricle demonstrates regional wall motion abnormalities. Definity contrast agent was given IV to delineate the left ventricular endocardial borders. The left ventricular internal cavity size was normal in size. There is no left ventricular  hypertrophy. Left ventricular diastolic function could not be evaluated due to  paced rhythm. Left ventricular diastolic function could not be evaluated.  LV Wall Scoring: The mid and distal anterior septum, apical anterior segment, and apex are akinetic. Right Ventricle: The right ventricular size is mildly enlarged. No increase in right ventricular wall thickness. Right ventricular systolic function is mildly reduced. There is mildly elevated pulmonary artery systolic pressure. The tricuspid regurgitant  velocity is 3.00 m/s, and with an assumed right atrial pressure of 8 mmHg, the estimated right ventricular systolic pressure is 56.3 mmHg. Left Atrium: Left atrial size was normal in size. Right Atrium: Right atrial size was normal in size. Pericardium: There is no evidence of pericardial effusion. Presence of pericardial fat pad. Mitral Valve: The mitral valve is grossly normal. Mild mitral valve regurgitation. No evidence of mitral valve stenosis. Tricuspid Valve: The tricuspid valve is grossly normal. Tricuspid valve regurgitation is mild to moderate. Aortic Valve: The aortic valve is tricuspid. Aortic valve regurgitation is not visualized. No aortic stenosis is present. Pulmonic Valve: The pulmonic valve was grossly normal. Pulmonic valve regurgitation is not visualized. No evidence of pulmonic stenosis. Aorta: The aortic root is normal in size and structure. IAS/Shunts: No atrial level shunt detected by color flow Doppler. Additional Comments: A pacer wire is visualized in the right atrium and right ventricle.  LEFT VENTRICLE PLAX 2D LVIDd:         5.79 cm  Diastology LVIDs:         4.69 cm  LV e' lateral:   6.30 cm/s LV PW:         1.00 cm  LV E/e' lateral: 11.2 LV IVS:        0.98 cm  LV e' medial:    4.65 cm/s LVOT diam:     2.10 cm  LV E/e' medial:  15.1 LV SV:         51 LV SV Index:   20 LVOT Area:     3.46 cm  RIGHT VENTRICLE RV S prime:     4.61 cm/s TAPSE (M-mode): 1.4 cm LEFT ATRIUM              Index       RIGHT ATRIUM           Index LA diam:        4.50 cm 1.82 cm/m  RA Area:     18.80 cm LA Vol (A2C):   43.3 ml 17.48 ml/m RA Volume:   48.90 ml  19.74 ml/m LA Vol (A4C):   48.7 ml 19.66 ml/m LA Biplane Vol: 46.6 ml 18.82 ml/m  AORTIC VALVE LVOT Vmax:   71.30 cm/s LVOT Vmean:  42.600 cm/s LVOT VTI:    0.146 m  AORTA Ao Root diam: 3.40 cm MITRAL VALVE               TRICUSPID VALVE MV Area (PHT): 4.60 cm    TR Peak grad:   36.0 mmHg MV Decel Time: 165 msec    TR Vmax:        300.00 cm/s MR Peak grad: 91.8 mmHg MR Vmax:      479.00 cm/s  SHUNTS MV E velocity: 70.30 cm/s  Systemic VTI:  0.15 m MV A velocity: 47.60 cm/s  Systemic Diam: 2.10 cm MV E/A ratio:  1.48 Eleonore Chiquito MD Electronically signed by Eleonore Chiquito MD Signature Date/Time: 11/20/2019/3:14:37 PM    Final     Cardiac Studies   Relevant CV Studies:  Echocardiogram 09/09/2014:  Study Conclusions   - Left  ventricle: The cavity size was normal. Wall thickness was  increased in a pattern of mild LVH. Systolic function was  moderately reduced. The estimated ejection fraction was in the  range of 35% to 40%. Akinesis of the midanteroseptal and anterior  myocardium. Doppler parameters are consistent with abnormal left  ventricular relaxation (grade 1 diastolic dysfunction).  Indeterminate mean left atrial filling pressures.  - Aortic valve: There was mild regurgitation.  - Mitral valve: Calcified annulus.  - Left atrium: The atrium was mildly dilated.   Impressions:   - Little change on direct comparison with study from February 2014.   TTE 10/21/12 Study Conclusions  - Left ventricle: The cavity size was normal. There was moderate concentric hypertrophy. Systolic function was mildly to moderately reduced. The estimated ejection fraction was in the range of 40% to 45%. There is akinesis of the mid-distalanteroseptal myocardium. - Mitral valve: Mild regurgitation. - Left atrium: The atrium was  mildly dilated. - Tricuspid valve: Moderate regurgitation. - Pulmonary arteries: PA peak pressure: 50m Hg (S).  LHC 09/14/2014:  Coronary Anatomy:  Dominance: Unknown, likely right  Left Main: Large-caliber vessel with distal 30% stenosis. Bifurcates into the nondominant circumflex and LAD. The apparent ramus medius was not visualized. LCNO:[id number]% stenosis before giving off a small moderate caliber diagonal branch. The vessels and 100 and occluded.  D1:Small moderate caliber vessel that covers the proximal anterolateral wall. Minimal luminal irregularities.  LIMA-LAD: Widely patent, tortuous graft to the mid LAD. It perfuses down around the inferolateral apex with minimal luminal irregularities distally. Left Circumflex:Nondominant, large caliber vessel that has a proximal and mid OM both of which are subtotal totally occluded in the mid vessels. The main circumflex terminates as a lateral/inferolateral OM branch. There are at least 2 stents in the proximal to mid section. Just beyond the distal stent there is a focal irregular 80% stenosis at the takeoff on the small marginal branches. The remainder the vessel is relatively free of disease and trifurcates into 3 branches distally along the inferolateral wall.  SVG-OM: Ostial occluded Ramus intermedius:Not seen on native left angiography.  SVG-RI: Ostial occluded  RCA: Large-caliber, likely dominant vessel that is 100 and occluded in the mid vessel. There are left-right collaterals from the circumflex AV groove branch that appeared to fill part of the posterior lateral system. Some left to right collaterals from the graft LAD perfuses part of the PDA distribution.  SVG-distal RCA: Ostial occluded with stents visible.  After reviewing the initial angiography, the culprit lesion was thought to be 80% irregular lesion in the mid circumflex. Preparation were made to proceed with PCI on this lesion.  Percutaneous Coronary  Intervention: Sheath exchanged for 6 Fr Guide: 6 Fr XB LAD 3.5Guidewire: BMW Predilation Balloon: Trek 2.5 mm x 12 mm;  ? 16 Atm x 30 Sec, Stent: Promus Premier DES 3.5 mm x 16 mm;  ? 16 Atm x 30 Sec, postdilated to 18 Atm x 30Sec - Final diameter 3.8 MM Post-dilation Balloon:[place] Trek 4.0 mm x 8 m;  ? 16 Atm x 45 Sec, Final Diameter: 4.0 mm   Patient Profile     74y.o. male with a hx of CAD [facility] SVG to OM1, SVG to RI) [place] [facility], DM 2, hypertension, hyperlipidemia, obesity, PAF on Eliquis, prior left frontal CVA and ICM  s/p ICD in 2013 who is being seen  for the evaluation of chest pain at the request of Dr. Hal Hope.   Assessment & Plan    1.  Chest pain/NSTEMI/CAD -concerning for ACS -hstrop mildly elevated [phone]) -last PCI was in 2016 and CP similar to then -he has significant hx of CAD with last cath in 2016 with SVGs occluded and severe native CAD and patent LIMA>LAD s/p PCI of the LCx -suspect that he has had progression of his underlying CAD -ACE I and diuretics on hold due to elevated creatinine -creatinine this am 1.86 -had recurrent CP this am - currently pain free - 12 Lead EKG a sensed V paced -he is on Eliquis but has not had any doses in 36 hours -IV heparin per pharmacy -continue ASA, Plavix, BB and statin -plan for LHC today - discussed at length with patient the risk of worsening renal function due to CKD and possible contrast nephropathy after cath.  We discussed the small possibility of need for transient HD as well.  Will plan cors and LIMA only (SVG are known to be occluded so will only need to do LIMA>LAD). If he needs PCI then would do PCI on Monday and give kidneys a chance to recover over weekend >> discussed with Dr. Angelena Form this am.  -he has chronic thrombocytopenia but platelet  count stable at 69K compared to prior years (72K in 2016)  2.  CAD -s/p remote CABG 1999 -s/p PCI in LCx 2016 -cath in 2016 with patent LIMA>LAD, occluded SVGs and severe native CAD -continue Crestor, BB, ASA, Plavix 31m daily -LHC  today  3.  PAF -maintaining NSR  -Eliquis on hold for cath  -last dose of Eliquis was pm 3/17 -IV Heparin stopped due to bleeding from IV site  4.  HLD -LDL goal < 70 -this admit LDL 22 but TAGS 379 -increase crestor to 27mdaily and add Vascepa 2gm BID -repeat FLP in 6 weeks  5.  DM2 -per TRH -HbA1C 8 -continue SS Insulin  6.  HTN -BP controlled -on carvedilol 3.12573mID, Hydralazine 57m51mD, HCTZ 25mg8mly, Lisinopril 40mg 59my at home -ACE I, hydralazine and diuretics on hold to allow adequate BP for renal perfusion  7.  Chronic combined systolic/diastolic CHF -s/p AICD -appears euvolemic on exam except for some mild LE edema -continue carvedilol -ACE I and diuretics on hold for cath -hydralazine held for soft BP  8.  CKD stage 3b -creatinine 1.86 today (1.32 in 2018)  9.  Thrombocytopenia -chronic problem -plt count 69K today which is around his baseline (72K in 2016)  I have spent a total of 35 minutes with patient reviewing hospital notes, discussion with Dr. McAlhaAngelena Formding cath with low platelets and CKD , telemetry, EKGs, labs and examining patient as well as establishing an assessment and plan that was discussed with the patient.  > 50% of time was spent in direct patient care.       For questions or updates, please contact [facility] consult [email or web address] for contact info under Cardiology/STEMI.      Signed, Makaylynn Bonillas Fransico Him3/19/2021, 9:10 AM

## 2019-11-21 NOTE — Progress Notes (Signed)
ANTICOAGULATION CONSULT NOTE - Follow Up Consult  Pharmacy Consult for heparin Indication: chest pain/ACS and atrial fibrillation  Labs: Recent Labs    11/19/19 2207 11/19/19 2207 11/20/19 0007 11/20/19 0336 11/20/19 0611 11/20/19 2005  HGB 13.5  --   --  12.6*  --   --   HCT 40.2  --   --  37.8*  --   --   PLT 97*  --   --  76*  --   --   APTT  --   --   --   --   --  59*  LABPROT 16.4*  --   --  18.1*  --   --   INR 1.3*  --   --  1.5*  --   --   HEPARINUNFRC  --   --   --   --   --  1.40*  CREATININE 1.84*  --   --  1.83*  --   --   TROPONINIHS 55*   < > 95* 314* 485*  --    < > = values in this interval not displayed.    Assessment/Plan:  RN reports that heparin was turned off when pt bled from two IV sites that were below a BP cuff, bleeding stopped after the BP cuff was removed and pressure was held.  IV sites at [facility] and wrist have been removed and IVT is currently trying to place new lines.  Will resume heparin gtt at 1400 units/hr at 0100 and check PTT in [place], PharmD, BCPS  11/21/2019,12:19 AM

## 2019-11-21 NOTE — Progress Notes (Signed)
ANTICOAGULATION CONSULT NOTE - Initial Consult  Pharmacy Consult for heparin Indication: chest pain/ACS, atrial fibrillation  Allergies  Allergen Reactions  . Tape Other (See Comments)    Causes skin irritation    Patient Measurements: Height: 6' (182.9 cm) Weight: 293 lb 12.8 oz (133.3 kg) IBW/kg (Calculated) : 77.6 Heparin Dosing Weight: 106kg  Vital Signs: Temp: 98.1 F (36.7 C) (03/19 0304) Temp Source: Oral (03/19 0304) BP: 122/54 (03/19 0846) Pulse Rate: 71 (03/19 0846)  Labs: Recent Labs    11/19/19 2207 11/19/19 2207 11/20/19 0007 11/20/19 0336 11/20/19 [id number] 11/20/19 2005 11/21/19 0329 11/21/19 0908  HGB 13.5   < >  --  12.6*  --   --  12.8*  --   HCT 40.2  --   --  37.8*  --   --  38.6*  --   PLT 97*  --   --  76*  --   --  69*  --   APTT  --   --   --   --   --  59*  --   --   LABPROT 16.4*  --   --  18.1*  --   --   --   --   INR 1.3*  --   --  1.5*  --   --   --   --   HEPARINUNFRC  --   --   --   --   --  1.40*  --  0.99*  CREATININE 1.84*  --   --  1.83*  --   --  1.86*  --   TROPONINIHS 55*   < > 95* 314* 485*  --   --   --    < > = values in this interval not displayed.    Estimated Creatinine Clearance: 48.5 mL/min (A) (by C-G formula based on SCr of 1.86 mg/dL (H)).   Medical History: Past Medical History:  Diagnosis Date  . AICD (automatic cardioverter/defibrillator) present   . Anxiety   . Arthritis    "occasionally in my right hand" (04/02/2015)  . CAD in native artery 1999  . Cancer ([place])   . CHF (congestive heart failure) ([place])   . Depression   . GERD (gastroesophageal reflux disease)    "occasionally" (04/02/2015)  . History of hiatal hernia   . Hypercholesterolemia   . Hypertension   . Myocardial infarction ([place]) "several"  . OSA on CPAP   . PAF (paroxysmal atrial fibrillation) ([place])   . PTSD (post-traumatic stress disorder)   . Stroke ([place])    "I've had 2; my right leg & arm slightly weaker since" (04/02/2015)  . Type II  diabetes mellitus (HCC)     Assessment: 45 YOM presenting with chest pain with hx CAD s/p CABG, hx of PAF previously on warfarin now on Eliquis (last dose 3/17), chronic thrombocytopenia plts at 76.    Overnight bleeding from 2 IV sites below BP cuff, bleeding stopped with cuff removal and pressure, gtt restarted @0100  on 1400 units/hr.  This AM heparin level remains falsely elevated, aPTT subtherapeutic at 52 seconds.  Chronic anemia/thrombocytopenia stable, no more reports of bleeding.    Goal of Therapy:  Heparin level 0.3-0.7 units/ml aPTT 66-102 seconds Monitor platelets by anticoagulation protocol: Yes   Plan:  Increase heparin gtt to 1500 units/hr F/u 8 hour aPTT  Bertis Ruddy, PharmD Clinical Pharmacist Please check AMION for all [place] numbers 11/21/2019 11:14 AM

## 2019-11-21 NOTE — Progress Notes (Signed)
Patient off of unit to cath lab. 

## 2019-11-22 DIAGNOSIS — Z8673 Personal history of transient ischemic attack (TIA), and cerebral infarction without residual deficits: Secondary | ICD-10-CM

## 2019-11-22 LAB — CBC
HCT: 36.8 % — ABNORMAL LOW (ref 39.0–52.0)
Hemoglobin: 12.4 g/dL — ABNORMAL LOW (ref 13.0–17.0)
MCH: 33.9 pg (ref 26.0–34.0)
MCHC: 33.7 g/dL (ref 30.0–36.0)
MCV: 100.5 fL — ABNORMAL HIGH (ref 80.0–100.0)
Platelets: 62 10*3/uL — ABNORMAL LOW (ref 150–400)
RBC: 3.66 MIL/uL — ABNORMAL LOW (ref 4.22–5.81)
RDW: 14.2 % (ref 11.5–15.5)
WBC: 8.4 10*3/uL (ref 4.0–10.5)
nRBC: 0 % (ref 0.0–0.2)

## 2019-11-22 LAB — APTT
aPTT: 62 seconds — ABNORMAL HIGH (ref 24–36)
aPTT: 58 seconds — ABNORMAL HIGH (ref 24–36)
aPTT: 74 seconds — ABNORMAL HIGH (ref 24–36)

## 2019-11-22 LAB — BASIC METABOLIC PANEL
Anion gap: 12 (ref 5–15)
BUN: 47 mg/dL — ABNORMAL HIGH (ref 8–23)
CO2: 23 mmol/L (ref 22–32)
Calcium: 8.6 mg/dL — ABNORMAL LOW (ref 8.9–10.3)
Chloride: 101 mmol/L (ref 98–111)
Creatinine, Ser: 1.77 mg/dL — ABNORMAL HIGH (ref 0.61–1.24)
GFR calc Af Amer: 43 mL/min — ABNORMAL LOW (ref >60)
GFR calc non Af Amer: 37 mL/min — ABNORMAL LOW (ref >60)
Glucose, Bld: 265 mg/dL — ABNORMAL HIGH (ref 70–99)
Potassium: 4.6 mmol/L (ref 3.5–5.1)
Sodium: 136 mmol/L (ref 135–145)

## 2019-11-22 LAB — GLUCOSE, CAPILLARY
Glucose-Capillary: 268 mg/dL — ABNORMAL HIGH (ref 70–99)
Glucose-Capillary: 310 mg/dL — ABNORMAL HIGH (ref 70–99)
Glucose-Capillary: 260 mg/dL — ABNORMAL HIGH (ref 70–99)
Glucose-Capillary: 282 mg/dL — ABNORMAL HIGH (ref 70–99)

## 2019-11-22 LAB — HEPARIN LEVEL (UNFRACTIONATED): Heparin Unfractionated: 0.64 IU/mL (ref 0.30–0.70)

## 2019-11-22 MED ORDER — FUROSEMIDE 10 MG/ML IJ SOLN
80.0000 mg | Freq: Once | INTRAMUSCULAR | Status: AC
  Administered 2019-11-22: 80 mg via INTRAVENOUS
  Filled 2019-11-22: qty 8

## 2019-11-22 MED ORDER — INSULIN GLARGINE 100 UNIT/ML ~~LOC~~ SOLN
20.0000 [IU] | Freq: Every day | SUBCUTANEOUS | Status: DC
  Administered 2019-11-22: 20 [IU] via SUBCUTANEOUS
  Filled 2019-11-22 (×2): qty 0.2

## 2019-11-22 NOTE — Plan of Care (Signed)
  Problem: Education: Goal: Knowledge of General Education information will improve Description: Including pain rating scale, medication(s)/side effects and non-pharmacologic comfort measures Outcome: Progressing  Patient with recall of cardiac history, asks questions about management of condition with lasix.  Problem: Activity: Goal: Risk for activity intolerance will decrease Outcome: Progressing  Pt up to bedside commode without c/o SOB.  Problem: Elimination: Goal: Will not experience complications related to bowel motility Outcome: Progressing  Pt with one episode of diarrhea today, which he states is chronic for him.

## 2019-11-22 NOTE — Progress Notes (Signed)
[place] for heparin Indication: chest pain/ACS, atrial fibrillation  Allergies  Allergen Reactions  . Tape Other (See Comments)    Causes skin irritation    Patient Measurements: Height: 6' (182.9 cm) Weight: 293 lb 9.6 oz (133.2 kg)(scale b) IBW/kg (Calculated) : 77.6 Heparin Dosing Weight: 106kg  Vital Signs: Temp: 98 F (36.7 C) (03/20 0512) Temp Source: Oral (03/20 0512) BP: 134/75 (03/20 0512) Pulse Rate: 66 (03/20 0512)  Labs: Recent Labs    11/19/19 2207 11/19/19 2207 11/20/19 0007 11/20/19 0336 11/20/19 0336 11/20/19 [id number] 11/20/19 2005 11/21/19 0329 11/21/19 0908 11/22/19 0343  HGB 13.5   < >  --  12.6*   < >  --   --  12.8*  --  12.4*  HCT 40.2   < >  --  37.8*  --   --   --  38.6*  --  36.8*  PLT 97*   < >  --  76*  --   --   --  69*  --  62*  APTT  --   --   --   --   --   --  59*  --  52* 62*  LABPROT 16.4*  --   --  18.1*  --   --   --   --   --   --   INR 1.3*  --   --  1.5*  --   --   --   --   --   --   HEPARINUNFRC  --   --   --   --   --   --  1.40*  --  0.99* 0.64  CREATININE 1.84*  --   --  1.83*  --   --   --  1.86*  --   --   TROPONINIHS 55*   < > 95* 314*  --  485*  --   --   --   --    < > = values in this interval not displayed.    Estimated Creatinine Clearance: 48.4 mL/min (A) (by C-G formula based on SCr of 1.86 mg/dL (H)).   Assessment: 74 YOM presenting with chest pain with hx CAD s/p CABG, hx of PAF previously on warfarin now on Eliquis (last dose 3/17), chronic thrombocytopenia plts at 76.     Heparin restarted post cath 3/19. Utilizing PTT for monitoring heparin right now as heparin levels still affected by Eliquis. PTT 62 sec (subtherapeutic) on gtt at 1500 units/hr.  Goal of Therapy:  Heparin level 0.3-0.7 units/ml aPTT 66-102 seconds Monitor platelets by anticoagulation protocol: Yes   Plan:  Increase heparin gtt to 1700 units/hr PTT in 8 hours  Sherlon Handing, PharmD,  BCPS Please see amion for complete clinical pharmacist phone list 11/22/2019 5:20 AM

## 2019-11-22 NOTE — Progress Notes (Signed)
[place] for heparin Indication: chest pain/ACS, atrial fibrillation  Patient Measurements: Height: 6' (182.9 cm) Weight: 293 lb 9.6 oz (133.2 kg)(scale b) IBW/kg (Calculated) : 77.6 Heparin Dosing Weight: 106kg  Vital Signs: Temp: 99.9 F (37.7 C) (03/20 2040) Temp Source: Oral (03/20 2040) BP: 116/47 (03/20 2040) Pulse Rate: 70 (03/20 2040)  Labs: Recent Labs    11/19/19 2207 11/19/19 2207 11/20/19 0007 11/20/19 0336 11/20/19 0336 11/20/19 [id number] 11/20/19 2005 11/20/19 2005 11/21/19 0329 11/21/19 0908 11/21/19 0908 11/22/19 0343 11/22/19 1340 11/22/19 2102  HGB 13.5   < >  --  12.6*   < >  --   --   --  12.8*  --   --  12.4*  --   --   HCT 40.2   < >  --  37.8*  --   --   --   --  38.6*  --   --  36.8*  --   --   PLT 97*   < >  --  76*  --   --   --   --  69*  --   --  62*  --   --   APTT  --   --   --   --   --   --  59*   < >  --  52*   < > 62* 58* 74*  LABPROT 16.4*  --   --  18.1*  --   --   --   --   --   --   --   --   --   --   INR 1.3*  --   --  1.5*  --   --   --   --   --   --   --   --   --   --   HEPARINUNFRC  --   --   --   --   --   --  1.40*  --   --  0.99*  --  0.64  --   --   CREATININE 1.84*   < >  --  1.83*  --   --   --   --  1.86*  --   --  1.77*  --   --   TROPONINIHS 55*   < > 95* 314*  --  485*  --   --   --   --   --   --   --   --    < > = values in this interval not displayed.    Estimated Creatinine Clearance: 50.9 mL/min (A) (by C-G formula based on SCr of 1.77 mg/dL (H)).   Assessment: 27 YOM presenting with chest pain with hx CAD s/p CABG, hx of PAF (CHADS2VASc = 6) previously on warfarin now on Eliquis (last dose 3/17), chronic thrombocytopenia plts at 62.     Heparin restarted post cath 3/19. Utilizing PTT for monitoring heparin right now as heparin levels still affected by Eliquis. APTT therapeutic at 74 sec on 1800 units/hr.    Goal of Therapy:  Heparin level 0.3-0.7 units/ml aPTT 66-102  seconds Monitor platelets by anticoagulation protocol: Yes   Plan:  Continue heparin gtt at 1800 units/hr F/u aPTT until correlates with heparin level  Monitor daily aptt, HL, CBC/plt Monitor for signs/symptoms of bleeding    Benetta Spar, PharmD, BCPS, BCCP Clinical Pharmacist  Please check AMION for all [place] phone numbers After  10:00 PM, call [place] [phone]

## 2019-11-22 NOTE — Progress Notes (Addendum)
[place] for heparin Indication: chest pain/ACS, atrial fibrillation  Allergies  Allergen Reactions  . Tape Other (See Comments)    Causes skin irritation    Patient Measurements: Height: 6' (182.9 cm) Weight: 293 lb 9.6 oz (133.2 kg)(scale b) IBW/kg (Calculated) : 77.6 Heparin Dosing Weight: 106kg  Vital Signs: Temp: 98.2 F (36.8 C) (03/20 1004) Temp Source: Oral (03/20 1004) BP: 138/64 (03/20 1004) Pulse Rate: 65 (03/20 1004)  Labs: Recent Labs    11/19/19 2207 11/19/19 2207 11/20/19 0007 11/20/19 0336 11/20/19 0336 11/20/19 0611 11/20/19 2005 11/20/19 2005 11/21/19 0329 11/21/19 0908 11/22/19 0343 11/22/19 1340  HGB 13.5   < >  --  12.6*   < >  --   --   --  12.8*  --  12.4*  --   HCT 40.2   < >  --  37.8*  --   --   --   --  38.6*  --  36.8*  --   PLT 97*   < >  --  76*  --   --   --   --  69*  --  62*  --   APTT  --   --   --   --   --   --  59*   < >  --  52* 62* 58*  LABPROT 16.4*  --   --  18.1*  --   --   --   --   --   --   --   --   INR 1.3*  --   --  1.5*  --   --   --   --   --   --   --   --   HEPARINUNFRC  --   --   --   --   --   --  1.40*  --   --  0.99* 0.64  --   CREATININE 1.84*   < >  --  1.83*  --   --   --   --  1.86*  --  1.77*  --   TROPONINIHS 55*   < > 95* 314*  --  485*  --   --   --   --   --   --    < > = values in this interval not displayed.    Estimated Creatinine Clearance: 50.9 mL/min (A) (by C-G formula based on SCr of 1.77 mg/dL (H)).   Assessment: 21 YOM presenting with chest pain with hx CAD s/p CABG, hx of PAF previously on warfarin now on Eliquis (last dose 3/17), chronic thrombocytopenia plts at 76.     Heparin restarted post cath 3/19. Utilizing PTT for monitoring heparin right now as heparin levels still affected by Eliquis. aPTT 58 sec (subtherapeutic) on gtt at 1700 units/hr.No issues with bleeding or infusion per RN. APTT appears to have been drawn correctly. Plts remain low  at 62 but low suspicion of HIT. Will continue to monitor. Hg/Hct is stable  Goal of Therapy:  Heparin level 0.3-0.7 units/ml aPTT 66-102 seconds Monitor platelets by anticoagulation protocol: Yes   Plan:  Increase heparin gtt to 1800 units/hr aPTT in 8 hours Monitor daily heparin level, aPTT, and CBC Can transition to heparin level monitoring once aPTT and heparin levels correlate. Watch for signs/symptoms of bleeding  Sherren Kerns, PharmD PGY1 Acute Care Pharmacy Resident Please see amion for complete clinical pharmacist phone list 11/22/2019 2:30 PM

## 2019-11-22 NOTE — Progress Notes (Signed)
Called to patient room, patient complaining of chest tightness/pressure and SOB.  On assessment, patient with wheezing on right anterior chest. Patient states he has this pressure and shortness of breath at home, and when asked how he treats it at home patient stating, "I just tough it out." Nitro SL given x1 with improvement. Coreg given as scheduled. Vital signs as documented, EKG done, MD paged.

## 2019-11-22 NOTE — Progress Notes (Signed)
Patient states chest discomfort and breathing are better, states he wants to rest.

## 2019-11-22 NOTE — Progress Notes (Signed)
Patient could not tolerate the full face mask, will have his wife bring in his home mask on 3/20 to wear.

## 2019-11-22 NOTE — Progress Notes (Signed)
Progress Note  Patient Name: Vernon Briggs Date of Encounter: 11/22/2019  Primary Cardiologist: No primary care provider on file.   Subjective   No CP  Breathing is a little short  Pt is tired      Inpatient Medications    Scheduled Meds: . aspirin  81 mg Oral Daily  . carvedilol  3.125 mg Oral BID WC  . clopidogrel  75 mg Oral Daily  . donepezil  5 mg Oral QHS  . FLUoxetine  40 mg Oral QHS  . furosemide  40 mg Oral Daily  . icosapent Ethyl  2 g Oral BID  . insulin aspart  0-20 Units Subcutaneous TID WC  . insulin aspart  0-5 Units Subcutaneous QHS  . insulin glargine  10 Units Subcutaneous QHS  . magnesium oxide  400 mg Oral BID  . mupirocin ointment  1 application Nasal BID  . rosuvastatin  20 mg Oral QHS  . sodium chloride flush  3 mL Intravenous Q12H  . sodium chloride flush  3 mL Intravenous Q12H   Continuous Infusions: . sodium chloride    . heparin 1,700 Units/hr (11/22/19 0538)   PRN Meds: sodium chloride, acetaminophen **OR** acetaminophen, hydrALAZINE, nitroGLYCERIN, ondansetron **OR** ondansetron (ZOFRAN) IV, sodium chloride flush   Vital Signs    Vitals:   11/21/19 1949 11/21/19 2045 11/21/19 2300 11/22/19 0512  BP: (!) 146/67   134/75  Pulse: 73 74  66  Resp: 20 20  20   Temp: 98.2 F (36.8 C)   98 F (36.7 C)  TempSrc: Oral   Oral  SpO2: 100% 98% 96% 100%  Weight:    133.2 kg  Height:        Intake/Output Summary (Last 24 hours) at 11/22/2019 0855 Last data filed at 11/21/2019 2125 Gross per 24 hour  Intake 120 ml  Output 750 ml  Net -630 ml   I/O  ? Complete  + 860    Filed Weights   11/19/19 2205 11/21/19 0255 11/22/19 0512  Weight: 129.3 kg 133.3 kg 133.2 kg    Telemetry    Paced - Personally Reviewed  ECG    Not done today  Personally Reviewed  Physical Exam   GEN: Morbidly obese 74 yo laying in bed  IN NAD  Neck: Neck full difficult to assess JVP   Cardiac: RRR, no murmurs, rubs, or gallops. Distant  Respiratory:  Decreased airflow  Poor effort  No rales or wheezes  GI: Soft, nontender Distended   MS: 1+ edema; No deformity. Neuro:  Nonfocal  Psych: Flat affect   Labs    Chemistry Recent Labs  Lab 11/20/19 0336 11/21/19 0329 11/22/19 0343  NA 141 137 136  K 4.4 4.7 4.6  CL 104 101 101  CO2 28 24 23   GLUCOSE 211* 314* 265*  BUN 45* 46* 47*  CREATININE 1.83* 1.86* 1.77*  CALCIUM 8.8* 8.8* 8.6*  PROT 5.8*  --   --   ALBUMIN 3.5  --   --   AST 27  --   --   ALT 21  --   --   ALKPHOS 77  --   --   BILITOT 0.9  --   --   GFRNONAA 35* 35* 37*  GFRAA 41* 40* 43*  ANIONGAP 9 12 12      Hematology Recent Labs  Lab 11/20/19 0336 11/21/19 0329 11/22/19 0343  WBC 6.7 8.4 8.4  RBC 3.81* 3.85* 3.66*  HGB 12.6* 12.8* 12.4*  HCT 37.8*  38.6* 36.8*  MCV 99.2 100.3* 100.5*  MCH 33.1 33.2 33.9  MCHC 33.3 33.2 33.7  RDW 14.1 14.2 14.2  PLT 76* 69* 62*    Cardiac EnzymesNo results for input(s): TROPONINI in the last 168 hours. No results for input(s): TROPIPOC in the last 168 hours.   BNPNo results for input(s): BNP, PROBNP in the last 168 hours.   DDimer  Recent Labs  Lab 11/20/19 [phone]  DDIMER 0.69*     Radiology    CARDIAC CATHETERIZATION  Result Date: 11/21/2019  Prox RCA to Mid RCA lesion is 100% stenosed.  Prox Cx to Dist Cx lesion is 10% stenosed.  Mid LM lesion is 90% stenosed.  Ost LAD to Prox LAD lesion is 100% stenosed.  LIMA graft was visualized by angiography and is normal in caliber.  SVG graft was not visualized.  Origin to Prox Graft lesion is 100% stenosed.  SVG graft was not visualized.  Origin to Prox Graft lesion is 100% stenosed.  SVG graft was not visualized.  Origin to Prox Graft lesion is 100% stenosed.  1. Severe left main stenosis. The left main supplies the Circumflex only as the LAD is occluded. 2. Chronic occlusion of the proximal LAD. The mid and distal LAD fills from the patent LIMA graft 3. Chronic occlusion of intermediate branch. Chronic  occlusion of the SVG to intermediate branch 4. Chronic occlusion SVG to OM 5. Chronic occlusion mid RCA. The RCA fills from left to right collaterals supplied by the LAD through the LIMA. The SVG to the RCA is chronically occluded. Recommendations: He has had progression of his left main disease. This is felt to be his culprit vessel. His left main supplies the Circumflex artery. The LAD is chronically occluded and fills from the patent LIMA graft. The RCA fills from left to right collaterals. I think he will need PCI of the left main lesion which is essentially a proximal Circumflex stenosis since the LAD is occluded and protected by the graft. Given his CKD, this was a diagnostic only cath. Will hydrate over the weekend. Given elevated LVEDP, will gently diurese. Continue ASA and Plavix. Resume IV heparin 6 hours post sheath pull. Will plan PCI/stenting of the left main artery on Monday. Will review films with the IC team. I do not think atherectomy is indicated prior to stenting.   ECHOCARDIOGRAM COMPLETE  Result Date: 11/20/2019    ECHOCARDIOGRAM REPORT   Patient Name:   Vernon Briggs Date of Exam: 11/20/2019 Medical Rec #:  [id number]       Height:       72.0 in Accession #:    [id number]      Weight:       285.0 lb Date of Birth:  May 16, 1946       BSA:          2.477 m Patient Age:    74 years        BP:           133/79 mmHg Patient Gender: M               HR:           63 bpm. Exam Location:  Inpatient Procedure: 2D Echo, Cardiac Doppler, Color Doppler and Intracardiac            Opacification Agent Indications:    Dyspnea 786.09  History:        Patient has prior history of Echocardiogram examinations, most  recent 09/09/2014. Cardiomyopathy, CAD, Prior CABG, Stroke and                 TIA, Signs/Symptoms:Chest Pain; Risk Factors:Diabetes and Former                 Smoker. Eleavted troponin.  Sonographer:    Vickie Epley RDCS Referring Phys: [place]  1. Left  ventricular ejection fraction, by estimation, is 25 to 30%. The left ventricle has severely decreased function. The left ventricle demonstrates regional wall motion abnormalities (see scoring diagram/findings for description). Left ventricular diastolic function could not be evaluated.  2. Right ventricular systolic function is mildly reduced. The right ventricular size is mildly enlarged. There is mildly elevated pulmonary artery systolic pressure. The estimated right ventricular systolic pressure is 123XX123 mmHg.  3. The mitral valve is grossly normal. Mild mitral valve regurgitation. No evidence of mitral stenosis.  4. Tricuspid valve regurgitation is mild to moderate.  5. The aortic valve is tricuspid. Aortic valve regurgitation is not visualized. No aortic stenosis is present.  6. Difficult study with poor windows despite contrast. LVEF severely reduced 25-30%. Mid septum into apex is akinetic consistent with prior infarction. No LV thrombus on contrast imaging. FINDINGS  Left Ventricle: Left ventricular ejection fraction, by estimation, is 25 to 30%. The left ventricle has severely decreased function. The left ventricle demonstrates regional wall motion abnormalities. Definity contrast agent was given IV to delineate the left ventricular endocardial borders. The left ventricular internal cavity size was normal in size. There is no left ventricular hypertrophy. Left ventricular diastolic function could not be evaluated due to paced rhythm. Left ventricular diastolic function could not be evaluated.  LV Wall Scoring: The mid and distal anterior septum, apical anterior segment, and apex are akinetic. Right Ventricle: The right ventricular size is mildly enlarged. No increase in right ventricular wall thickness. Right ventricular systolic function is mildly reduced. There is mildly elevated pulmonary artery systolic pressure. The tricuspid regurgitant  velocity is 3.00 m/s, and with an assumed right atrial pressure  of 8 mmHg, the estimated right ventricular systolic pressure is 123XX123 mmHg. Left Atrium: Left atrial size was normal in size. Right Atrium: Right atrial size was normal in size. Pericardium: There is no evidence of pericardial effusion. Presence of pericardial fat pad. Mitral Valve: The mitral valve is grossly normal. Mild mitral valve regurgitation. No evidence of mitral valve stenosis. Tricuspid Valve: The tricuspid valve is grossly normal. Tricuspid valve regurgitation is mild to moderate. Aortic Valve: The aortic valve is tricuspid. Aortic valve regurgitation is not visualized. No aortic stenosis is present. Pulmonic Valve: The pulmonic valve was grossly normal. Pulmonic valve regurgitation is not visualized. No evidence of pulmonic stenosis. Aorta: The aortic root is normal in size and structure. IAS/Shunts: No atrial level shunt detected by color flow Doppler. Additional Comments: A pacer wire is visualized in the right atrium and right ventricle.  LEFT VENTRICLE PLAX 2D LVIDd:         5.79 cm  Diastology LVIDs:         4.69 cm  LV e' lateral:   6.30 cm/s LV PW:         1.00 cm  LV E/e' lateral: 11.2 LV IVS:        0.98 cm  LV e' medial:    4.65 cm/s LVOT diam:     2.10 cm  LV E/e' medial:  15.1 LV SV:         51  LV SV Index:   20 LVOT Area:     3.46 cm  RIGHT VENTRICLE RV S prime:     4.61 cm/s TAPSE (M-mode): 1.4 cm LEFT ATRIUM             Index       RIGHT ATRIUM           Index LA diam:        4.50 cm 1.82 cm/m  RA Area:     18.80 cm LA Vol (A2C):   43.3 ml 17.48 ml/m RA Volume:   48.90 ml  19.74 ml/m LA Vol (A4C):   48.7 ml 19.66 ml/m LA Biplane Vol: 46.6 ml 18.82 ml/m  AORTIC VALVE LVOT Vmax:   71.30 cm/s LVOT Vmean:  42.600 cm/s LVOT VTI:    0.146 m  AORTA Ao Root diam: 3.40 cm MITRAL VALVE               TRICUSPID VALVE MV Area (PHT): 4.60 cm    TR Peak grad:   36.0 mmHg MV Decel Time: 165 msec    TR Vmax:        300.00 cm/s MR Peak grad: 91.8 mmHg MR Vmax:      479.00 cm/s  SHUNTS MV E velocity:  70.30 cm/s  Systemic VTI:  0.15 m MV A velocity: 47.60 cm/s  Systemic Diam: 2.10 cm MV E/A ratio:  1.48 Eleonore Chiquito MD Electronically signed by Eleonore Chiquito MD Signature Date/Time: 11/20/2019/3:14:37 PM    Final     Cardiac Studies   Relevant CV Studies:  LHC 3/19    Prox RCA to Mid RCA lesion is 100% stenosed.  Prox Cx to Dist Cx lesion is 10% stenosed.  Mid LM lesion is 90% stenosed.  Ost LAD to Prox LAD lesion is 100% stenosed.  LIMA graft was visualized by angiography and is normal in caliber.  SVG graft was not visualized.  Origin to Prox Graft lesion is 100% stenosed.  SVG graft was not visualized.  Origin to Prox Graft lesion is 100% stenosed.  SVG graft was not visualized.  Origin to Prox Graft lesion is 100% stenosed.   1. Severe left main stenosis. The left main supplies the Circumflex only as the LAD is occluded.  2. Chronic occlusion of the proximal LAD. The mid and distal LAD fills from the patent LIMA graft 3. Chronic occlusion of intermediate branch. Chronic occlusion of the SVG to intermediate branch 4. Chronic occlusion SVG to OM 5. Chronic occlusion mid RCA. The RCA fills from left to right collaterals supplied by the LAD through the LIMA. The SVG to the RCA is chronically occluded.   Recommendations: He has had progression of his left main disease. This is felt to be his culprit vessel. His left main supplies the Circumflex artery. The LAD is chronically occluded and fills from the patent LIMA graft. The RCA fills from left to right collaterals. I think he will need PCI of the left main lesion which is essentially a proximal Circumflex stenosis since the LAD is occluded and protected by the graft. Given his CKD, this was a diagnostic only cath.  Will hydrate over the weekend. Given elevated LVEDP, will gently diurese.  Continue ASA and Plavix. Resume IV heparin 6 hours post sheath pull.  Will plan PCI/stenting of the left main artery on Monday. Will  review films with the IC team. I do not think atherectomy is indicated prior to stenting.      Echocardiogram  09/09/2014:  Study Conclusions   - Left ventricle: The cavity size was normal. Wall thickness was  increased in a pattern of mild LVH. Systolic function was  moderately reduced. The estimated ejection fraction was in the  range of 35% to 40%. Akinesis of the midanteroseptal and anterior  myocardium. Doppler parameters are consistent with abnormal left  ventricular relaxation (grade 1 diastolic dysfunction).  Indeterminate mean left atrial filling pressures.  - Aortic valve: There was mild regurgitation.  - Mitral valve: Calcified annulus.  - Left atrium: The atrium was mildly dilated.   Impressions:   - Little change on direct comparison with study from February 2014.   TTE 10/21/12 Study Conclusions  - Left ventricle: The cavity size was normal. There was moderate concentric hypertrophy. Systolic function was mildly to moderately reduced. The estimated ejection fraction was in the range of 40% to 45%. There is akinesis of the mid-distalanteroseptal myocardium. - Mitral valve: Mild regurgitation. - Left atrium: The atrium was mildly dilated. - Tricuspid valve: Moderate regurgitation. - Pulmonary arteries: PA peak pressure: 27mm Hg (S).    Patient Profile     74 y.o. male with a hx of CAD status post CABG x4 12/21/1997 [facility] with LIMA to LAD, SVG to dRCA, SVG to OM1, SVG to RI) with PCI x6 in approximately 2011 ([place]) with most recent PCI/DES to LCx in 2016 at [facility], CKD stage III, DM 2, hypertension, hyperlipidemia, obesity, PAF on Eliquis, prior left frontal CVA and ICM s/p ICD in 2013 who is being seen  for the evaluation of chest pain at the request of Dr. Hal Hope.   Assessment & Plan    1.  Chest pain/NSTEMI/CAD  REmote CABG 1999 -last PCI was in 2016 and CP similar to then -he has significant hx of CAD with last cath in 2016 with  SVGs occluded and severe native CAD and patent LIMA>LAD s/p PCI of the LCx  L heart cath as noted above done yesterday    LM with severe stenosos  Felt culprit  Plan for intervention early next week   For now watch renal function  Try to diurese some   WIll give IV lasix later today    Keep on ASA , Plavix, Crestor    3.  Rhythm   -maintaining NSR   Paced   Off of Eliuqis  ON Heparin    -  4.  HLD -LDL goal < 70 -this admit LDL 22 but TAGS 379 -increase crestor to 20mg  daily and add Vascepa 2gm BID -repeat FLP in 6 weeks  5.  DM2 -per TRH -HbA1C 8 -continue SS Insulin  6.  HTN -BP mildly increased   -on carvedilol 3.125mg  BID, Hydralazine 50mg  TID, HCTZ 25mg  daily, Lisinopril 40mg  daily at home -ACE I, hydralazine and diuretics on hold to allow adequate BP for renal perfusion  7.  Chronic combined systolic/diastolic CHF WIll give IV lasix later today  Strict I/O  Watch renal function    8.  CKD stage 3b -creatinine 1.77    9.  Thrombocytopenia -chronic problem  PLt 62 K   Will need to review what meds to go home on after intervention   Not triple Rx           For questions or updates, please contact [place] Please consult [email or web address] for contact info under Cardiology/STEMI.      Signed, Dorris Carnes, MD  11/22/2019, 8:55 AM

## 2019-11-22 NOTE — Progress Notes (Signed)
[facility]  PROGRESS NOTE  Vernon Briggs [id number] DOB: 1946-04-21 DOA: 11/19/2019 PCP: [name], MD   Patient admitted this morning by Dr. Hal Hope.  See detailed H&P.   Brief HPI:   74 year old male with a history of CAD s/p CABG and stenting in 2016, CKD stage III, diabetes mellitus type 2, hypertension, paroxysmal atrial fibrillation, ischemic cardiomyopathy status post AICD presented to the ED with complaints of chest pain.  Patient stated that he was having chest pain and shortness of breath for past 1 month.  In the ED EKG showed normal sinus rhythm with IVCD.  Chest x-ray was unremarkable.  COVID-19 test was negative.  Creatinine was 1.8 which increased from baseline around 1.6.  Patient mated with chest pain with concern for ACS.  Cardiology was consulted.    Subjective   Patient seen and examined, denies chest pain or shortness of breath.  Left heart cath done yesterday.   Assessment/Plan:     1. Chest pain-patient has history of CAD s/p CABG, cardiology was consulted.  Patient underwent diagnostic cardiac cath.  Found to have stenosis of left circumflex.  No stenting was performed due to the renal insufficiency.  Patient's renal function is closely monitored, he will get IV Lasix as per cardiology.  Plan for repeat cardiac cath with stenting on Monday.  Continue aspirin, Plavix, heparin.   2. Diabetes mellitus type 2-blood glucose is mildly elevated, patient takes concentrated Humulin R 55 to 110 units at home.  He has been started on resistant sliding scale.  Blood glucose is still elevated.  Will increase Lantus to 20 units subcu daily.      3. Hypertension-continue Coreg, blood pressure is well controlled.  Continue IV as needed hydralazine.  4. CKD stage III-patient baseline creatinine is around 1.3-1.5.  He presented with creatinine of 1.8.  Today creatinine is 1.77.  Cardiology planning to give IV Lasix today.  Follow BMP in a.m.  5. Ischemic  cardiomyopathy s/p AICD-continue aspirin, Plavix, heparin, Crestor.  6. Paroxysmal atrial fibrillation-continue Coreg, Coumadin.  7. Thrombocytopenia- chronic. Today platelet count is 62,000.    SpO2: 100 % O2 Flow Rate (L/min): 4 L/min   COVID-19 Labs  Recent Labs    11/20/19 0611  DDIMER 0.69*    Lab Results  Component Value Date   SARSCOV2NAA NEGATIVE 11/19/2019     CBG: Recent Labs  Lab 11/21/19 0631 11/21/19 1208 11/21/19 1638 11/21/19 2114 11/22/19 0614  GLUCAP 325* 258* 243* 248* 268*    CBC: Recent Labs  Lab 11/19/19 2207 11/20/19 0336 11/21/19 0329 11/22/19 0343  WBC 7.4 6.7 8.4 8.4  NEUTROABS  --  4.6  --   --   HGB 13.5 12.6* 12.8* 12.4*  HCT 40.2 37.8* 38.6* 36.8*  MCV 100.8* 99.2 100.3* 100.5*  PLT 97* 76* 69* 62*    Basic Metabolic Panel: Recent Labs  Lab 11/19/19 2207 11/20/19 0336 11/21/19 0329 11/22/19 0343  NA 140 141 137 136  K 5.2* 4.4 4.7 4.6  CL 105 104 101 101  CO2 23 28 24 23   GLUCOSE 260* 211* 314* 265*  BUN 49* 45* 46* 47*  CREATININE 1.84* 1.83* 1.86* 1.77*  CALCIUM 9.0 8.8* 8.8* 8.6*     Liver Function Tests: Recent Labs  Lab 11/20/19 0336  AST 27  ALT 21  ALKPHOS 77  BILITOT 0.9  PROT 5.8*  ALBUMIN 3.5        DVT prophylaxis: Heparin  Code Status: Full code  Family Communication: No  family at bedside  Disposition Plan: Patient admitted with chest pain, concern for ACS.  Underwent cardiac cath.  Barrier to discharge-he will need repeat procedure, cardiac cath with stenting on Monday after hydration over the weekend.         Scheduled medications:  . aspirin  81 mg Oral Daily  . carvedilol  3.125 mg Oral BID WC  . clopidogrel  75 mg Oral Daily  . donepezil  5 mg Oral QHS  . FLUoxetine  40 mg Oral QHS  . furosemide  40 mg Oral Daily  . icosapent Ethyl  2 g Oral BID  . insulin aspart  0-20 Units Subcutaneous TID WC  . insulin aspart  0-5 Units Subcutaneous QHS  . insulin glargine  10  Units Subcutaneous QHS  . magnesium oxide  400 mg Oral BID  . mupirocin ointment  1 application Nasal BID  . rosuvastatin  20 mg Oral QHS  . sodium chloride flush  3 mL Intravenous Q12H  . sodium chloride flush  3 mL Intravenous Q12H    Consultants:  Cardiology  Procedures:  Diagnostic cardiac cath  Antibiotics:   Anti-infectives (From admission, onward)   None       Objective   Vitals:   11/21/19 2045 11/21/19 2300 11/22/19 0512 11/22/19 1004  BP:   134/75 138/64  Pulse: 74  66 65  Resp: 20  20 18   Temp:   98 F (36.7 C) 98.2 F (36.8 C)  TempSrc:   Oral Oral  SpO2: 98% 96% 100% 100%  Weight:   133.2 kg   Height:        Intake/Output Summary (Last 24 hours) at 11/22/2019 1143 Last data filed at 11/22/2019 0900 Gross per 24 hour  Intake 380 ml  Output 400 ml  Net -20 ml    03/18 1901 - 03/20 0700 In: 525.5 [P.O.:360; I.V.:165.5] Out: 950 [Urine:950]  Filed Weights   11/19/19 2205 11/21/19 0255 11/22/19 0512  Weight: 129.3 kg 133.3 kg 133.2 kg    Physical Examination:    General-appears in no acute distress  Heart-S1-S2, regular, no murmur auscultated  Lungs-clear to auscultation bilaterally, no wheezing or crackles auscultated  Abdomen-soft, nontender, no organomegaly  Extremities-no edema in the lower extremities  Neuro-alert, oriented x3, no focal deficit noted    Data Reviewed:   Recent Results (from the past 240 hour(s))  Respiratory Panel by RT PCR (Flu A&B, Covid) - Nasopharyngeal Swab     Status: None   Collection Time: 11/19/19 11:45 PM   Specimen: Nasopharyngeal Swab  Result Value Ref Range Status   SARS Coronavirus 2 by RT PCR NEGATIVE NEGATIVE Final    Comment: (NOTE) SARS-CoV-2 target nucleic acids are NOT DETECTED. The SARS-CoV-2 RNA is generally detectable in upper respiratoy specimens during the acute phase of infection. The lowest concentration of SARS-CoV-2 viral copies this assay can detect is 131 copies/mL. A  negative result does not preclude SARS-Cov-2 infection and should not be used as the sole basis for treatment or other patient management decisions. A negative result may occur with  improper specimen collection/handling, submission of specimen other than nasopharyngeal swab, presence of viral mutation(s) within the areas targeted by this assay, and inadequate number of viral copies (<131 copies/mL). A negative result must be combined with clinical observations, patient history, and epidemiological information. The expected result is Negative. Fact Sheet for Patients:  PinkCheek.be Fact Sheet for Healthcare Providers:  GravelBags.it This test is not yet ap proved or cleared by  the [employer] and  has been authorized for detection and/or diagnosis of SARS-CoV-2 by FDA under an Emergency Use Authorization (EUA). This EUA will remain  in effect (meaning this test can be used) for the duration of the COVID-19 declaration under Section 564(b)(1) of the Act, 21 U.S.C. section 360bbb-3(b)(1), unless the authorization is terminated or revoked sooner.    Influenza A by PCR NEGATIVE NEGATIVE Final   Influenza B by PCR NEGATIVE NEGATIVE Final    Comment: (NOTE) The Xpert Xpress SARS-CoV-2/FLU/RSV assay is intended as an aid in  the diagnosis of influenza from Nasopharyngeal swab specimens and  should not be used as a sole basis for treatment. Nasal washings and  aspirates are unacceptable for Xpert Xpress SARS-CoV-2/FLU/RSV  testing. Fact Sheet for Patients: PinkCheek.be Fact Sheet for Healthcare Providers: GravelBags.it This test is not yet approved or cleared by the [place] FDA and  has been authorized for detection and/or diagnosis of SARS-CoV-2 by  FDA under an Emergency Use Authorization (EUA). This EUA will remain  in effect (meaning this test can be used) for  the duration of the  Covid-19 declaration under Section 564(b)(1) of the Act, 21  U.S.C. section 360bbb-3(b)(1), unless the authorization is  terminated or revoked. Performed at [facility], [place] [address] [place], [place] [address]   Surgical PCR screen     Status: None   Collection Time: 11/21/19  2:39 AM   Specimen: Nasal Mucosa; Nasal Swab  Result Value Ref Range Status   MRSA, PCR NEGATIVE NEGATIVE Final   Staphylococcus aureus NEGATIVE NEGATIVE Final    Comment: (NOTE) The Xpert SA Assay (FDA approved for NASAL specimens in patients 68 years of age and older), is one component of a comprehensive surveillance program. It is not intended to diagnose infection nor to guide or monitor treatment. Performed at [facility], [place] [address], [place], [place] [address]      Studies:  CARDIAC CATHETERIZATION  Result Date: 11/21/2019  Prox RCA to Mid RCA lesion is 100% stenosed.  Prox Cx to Dist Cx lesion is 10% stenosed.  Mid LM lesion is 90% stenosed.  Ost LAD to Prox LAD lesion is 100% stenosed.  LIMA graft was visualized by angiography and is normal in caliber.  SVG graft was not visualized.  Origin to Prox Graft lesion is 100% stenosed.  SVG graft was not visualized.  Origin to Prox Graft lesion is 100% stenosed.  SVG graft was not visualized.  Origin to Prox Graft lesion is 100% stenosed.  1. Severe left main stenosis. The left main supplies the Circumflex only as the LAD is occluded. 2. Chronic occlusion of the proximal LAD. The mid and distal LAD fills from the patent LIMA graft 3. Chronic occlusion of intermediate branch. Chronic occlusion of the SVG to intermediate branch 4. Chronic occlusion SVG to OM 5. Chronic occlusion mid RCA. The RCA fills from left to right collaterals supplied by the LAD through the LIMA. The SVG to the RCA is chronically occluded. Recommendations: He has had progression of his left main disease. This is felt to be his culprit vessel.  His left main supplies the Circumflex artery. The LAD is chronically occluded and fills from the patent LIMA graft. The RCA fills from left to right collaterals. I think he will need PCI of the left main lesion which is essentially a proximal Circumflex stenosis since the LAD is occluded and protected by the graft. Given his CKD, this was  a diagnostic only cath. Will hydrate over the weekend. Given elevated LVEDP, will gently diurese. Continue ASA and Plavix. Resume IV heparin 6 hours post sheath pull. Will plan PCI/stenting of the left main artery on Monday. Will review films with the IC team. I do not think atherectomy is indicated prior to stenting.     Admission status: The appropriate admission status for this patient is INPATIENT. Inpatient status is judged to be reasonable and necessary in order to provide the required intensity of service to ensure the patient's safety. The patient's presenting symptoms, physical exam findings, and initial radiographic and laboratory data in the context of their chronic comorbidities is felt to place them at high risk for further clinical deterioration. Furthermore, it is not anticipated that the patient will be medically stable for discharge from the hospital within 2 midnights of admission. The following factors support the admission status of inpatient.    The patient's presenting symptoms include chest pain The worrisome physical exam findings include  The initial radiographic and laboratory data are worrisome because of elevated troponin, left circumflex stenosis seen on cardiac cath The chronic co-morbidities include obesity, hypertension, CAD    * I certify that at the point of admission it is my clinical judgment that the patient will require inpatient hospital care spanning beyond 2 midnights from the point of admission due to high intensity of service, high risk for further deterioration and high frequency of surveillance required.Oswald Hillock   [facility] If 7PM-7AM, please contact night-coverage at [email or web address], Office  [phone]   11/22/2019, 11:43 AM  LOS: 2 days

## 2019-11-23 LAB — GLUCOSE, CAPILLARY
Glucose-Capillary: 243 mg/dL — ABNORMAL HIGH (ref 70–99)
Glucose-Capillary: 287 mg/dL — ABNORMAL HIGH (ref 70–99)
Glucose-Capillary: 229 mg/dL — ABNORMAL HIGH (ref 70–99)
Glucose-Capillary: 302 mg/dL — ABNORMAL HIGH (ref 70–99)

## 2019-11-23 LAB — HEPARIN LEVEL (UNFRACTIONATED): Heparin Unfractionated: 0.41 IU/mL (ref 0.30–0.70)

## 2019-11-23 LAB — CBC
HCT: 35.6 % — ABNORMAL LOW (ref 39.0–52.0)
Hemoglobin: 11.7 g/dL — ABNORMAL LOW (ref 13.0–17.0)
MCH: 32.9 pg (ref 26.0–34.0)
MCHC: 32.9 g/dL (ref 30.0–36.0)
MCV: 100 fL (ref 80.0–100.0)
Platelets: 67 10*3/uL — ABNORMAL LOW (ref 150–400)
RBC: 3.56 MIL/uL — ABNORMAL LOW (ref 4.22–5.81)
RDW: 14 % (ref 11.5–15.5)
WBC: 8.3 10*3/uL (ref 4.0–10.5)
nRBC: 0 % (ref 0.0–0.2)

## 2019-11-23 LAB — BASIC METABOLIC PANEL
Anion gap: 12 (ref 5–15)
BUN: 54 mg/dL — ABNORMAL HIGH (ref 8–23)
CO2: 25 mmol/L (ref 22–32)
Calcium: 8.8 mg/dL — ABNORMAL LOW (ref 8.9–10.3)
Chloride: 100 mmol/L (ref 98–111)
Creatinine, Ser: 1.85 mg/dL — ABNORMAL HIGH (ref 0.61–1.24)
GFR calc Af Amer: 40 mL/min — ABNORMAL LOW (ref >60)
GFR calc non Af Amer: 35 mL/min — ABNORMAL LOW (ref >60)
Glucose, Bld: 262 mg/dL — ABNORMAL HIGH (ref 70–99)
Potassium: 4.7 mmol/L (ref 3.5–5.1)
Sodium: 137 mmol/L (ref 135–145)

## 2019-11-23 LAB — APTT: aPTT: 71 seconds — ABNORMAL HIGH (ref 24–36)

## 2019-11-23 MED ORDER — FUROSEMIDE 10 MG/ML IJ SOLN
40.0000 mg | Freq: Two times a day (BID) | INTRAMUSCULAR | Status: DC
  Administered 2019-11-23: 40 mg via INTRAVENOUS
  Filled 2019-11-23: qty 4

## 2019-11-23 MED ORDER — INSULIN GLARGINE 100 UNIT/ML ~~LOC~~ SOLN
30.0000 [IU] | Freq: Every day | SUBCUTANEOUS | Status: DC
  Administered 2019-11-24 (×2): 30 [IU] via SUBCUTANEOUS
  Filled 2019-11-23 (×3): qty 0.3

## 2019-11-23 MED ORDER — FUROSEMIDE 10 MG/ML IJ SOLN
80.0000 mg | Freq: Once | INTRAMUSCULAR | Status: AC
  Administered 2019-11-23: 80 mg via INTRAVENOUS

## 2019-11-23 NOTE — Progress Notes (Signed)
ANTICOAGULATION CONSULT NOTE  Pharmacy Consult for heparin Indication: chest pain/ACS, atrial fibrillation  Patient Measurements: Height: 6' (182.9 cm) Weight: 293 lb (132.9 kg) IBW/kg (Calculated) : 77.6 Heparin Dosing Weight: 106kg  Vital Signs: Temp: 99.4 F (37.4 C) (03/21 0451) Temp Source: Oral (03/21 0451) BP: 148/79 (03/21 0451) Pulse Rate: 69 (03/21 0451)  Labs: Recent Labs    11/20/19 2005 11/21/19 0329 11/21/19 0329 11/21/19 0908 11/22/19 0343 11/22/19 0343 11/22/19 1340 11/22/19 2102 11/23/19 0403  HGB  --  12.8*   < >  --  12.4*  --   --   --  11.7*  HCT  --  38.6*  --   --  36.8*  --   --   --  35.6*  PLT  --  69*  --   --  62*  --   --   --  67*  APTT   < >  --    < > 52* 62*   < > 58* 74* 71*  HEPARINUNFRC   < >  --   --  0.99* 0.64  --   --   --  0.41  CREATININE  --  1.86*  --   --  1.77*  --   --   --  1.85*   < > = values in this interval not displayed.    Estimated Creatinine Clearance: 48.7 mL/min (A) (by C-G formula based on SCr of 1.85 mg/dL (H)).   Assessment: 34 YOM presenting with chest pain with hx CAD s/p CABG, hx of PAF (CHADS2VASc = 6) previously on warfarin now on Eliquis (last dose 3/17), chronic thrombocytopenia plts at 62.     Heparin restarted post cath 3/19. Utilizing PTT for monitoring heparin right now as heparin levels still affected by Eliquis. APTT therapeutic at 71 sec on 1800 units/hr. Heparin level seems to be correlating better with a therapeutic result of 0.41. Eliquis has likely washed out now that last dose was ~ 4 days ago. Plts low but stable at 67. Hg decreased slightly from 12.4 to 11.7.  Goal of Therapy:  Heparin level 0.3-0.7 units/ml aPTT 66-102 seconds Monitor platelets by anticoagulation protocol: Yes   Plan:  Continue heparin gtt at 1800 units/hr Will stop aPTT monitoring and dose heparin based on heparin levels now that aPTT and heparin levels are correlating. Monitor daily HL, CBC/plt Monitor for  signs/symptoms of bleeding    Sherren Kerns, PharmD PGY1 Acute Care Pharmacy Resident Please check AMION for all [place] phone numbers After 10:00 PM, call [place] [phone]

## 2019-11-23 NOTE — Progress Notes (Signed)
[facility]  PROGRESS NOTE  Vernon Briggs [id number] DOB: [birth year] DOA: 11/19/2019 PCP: [name], MD   Patient admitted this morning by Dr. Hal Hope.  See detailed H&P.   Brief HPI:   74 year old male with a history of CAD s/p CABG and stenting in 2016, CKD stage III, diabetes mellitus type 2, hypertension, paroxysmal atrial fibrillation, ischemic cardiomyopathy status post AICD presented to the ED with complaints of chest pain.  Patient stated that he was having chest pain and shortness of breath for past 1 month.  In the ED EKG showed normal sinus rhythm with IVCD.  Chest x-ray was unremarkable.  COVID-19 test was negative.  Creatinine was 1.8 which increased from baseline around 1.6.  Patient mated with chest pain with concern for ACS.  Cardiology was consulted.    Subjective   Patient seen and examined, feels mildly short of breath.  Could not use CPAP at night.   Assessment/Plan:     1. Chest pain-patient has history of CAD s/p CABG, cardiology was consulted.  Patient underwent diagnostic cardiac cath.  Found to have stenosis of left circumflex.  No stenting was performed due to the renal insufficiency.  Patient's renal function is closely monitored, he will get IV Lasix as per cardiology.  Plan for repeat cardiac cath with stenting on Monday.  Continue aspirin, Plavix, heparin.   2. Diabetes mellitus type 2-blood glucose is mildly elevated, patient takes concentrated Humulin R 55 to 110 units at home.  He has been started on resistant sliding scale.  Blood glucose is still elevated.  Will increase Lantus to 30 units subcu daily.      3. Hypertension-continue Coreg, blood pressure is well controlled.  Continue IV as needed hydralazine.  4. CKD stage III-patient baseline creatinine is around 1.3-1.5.  He presented with creatinine of 1.8.  Today creatinine is 1.85  Cardiology planning to give IV Lasix today.  Follow BMP in a.m.  5. Ischemic cardiomyopathy s/p  AICD-continue aspirin, Plavix, heparin, Crestor.  6. Paroxysmal atrial fibrillation-continue Coreg, Coumadin.  7. Thrombocytopenia- chronic. Today platelet count is 67,000.  8. Sleep apnea-continue CPAP nightly    SpO2: 97 % O2 Flow Rate (L/min): 4 L/min   COVID-19 Labs  No results for input(s): DDIMER, FERRITIN, LDH, CRP in the last 72 hours.  Lab Results  Component Value Date   [place] NEGATIVE 11/19/2019     CBG: Recent Labs  Lab 11/22/19 1212 11/22/19 1651 11/22/19 2111 11/23/19 0609 11/23/19 1141  GLUCAP 310* 260* 282* 243* 287*    CBC: Recent Labs  Lab 11/19/19 2207 11/20/19 0336 11/21/19 0329 11/22/19 0343 11/23/19 0403  WBC 7.4 6.7 8.4 8.4 8.3  NEUTROABS  --  4.6  --   --   --   HGB 13.5 12.6* 12.8* 12.4* 11.7*  HCT 40.2 37.8* 38.6* 36.8* 35.6*  MCV 100.8* 99.2 100.3* 100.5* 100.0  PLT 97* 76* 69* 62* 67*    Basic Metabolic Panel: Recent Labs  Lab 11/19/19 2207 11/20/19 0336 11/21/19 0329 11/22/19 0343 11/23/19 0403  NA 140 141 137 136 137  K 5.2* 4.4 4.7 4.6 4.7  CL 105 104 101 101 100  CO2 23 28 24 23 25   GLUCOSE 260* 211* 314* 265* 262*  BUN 49* 45* 46* 47* 54*  CREATININE 1.84* 1.83* 1.86* 1.77* 1.85*  CALCIUM 9.0 8.8* 8.8* 8.6* 8.8*     Liver Function Tests: Recent Labs  Lab 11/20/19 0336  AST 27  ALT 21  ALKPHOS 77  BILITOT  0.9  PROT 5.8*  ALBUMIN 3.5        DVT prophylaxis: Heparin  Code Status: Full code  Family Communication: No family at bedside  Disposition Plan: Patient admitted with chest pain, concern for ACS.  Underwent cardiac cath.  Barrier to discharge-he will need repeat procedure, cardiac cath with stenting on Monday after hydration over the weekend.     Scheduled medications:  . aspirin  81 mg Oral Daily  . carvedilol  3.125 mg Oral BID WC  . clopidogrel  75 mg Oral Daily  . donepezil  5 mg Oral QHS  . FLUoxetine  40 mg Oral QHS  . icosapent Ethyl  2 g Oral BID  . insulin aspart  0-20  Units Subcutaneous TID WC  . insulin aspart  0-5 Units Subcutaneous QHS  . insulin glargine  20 Units Subcutaneous QHS  . magnesium oxide  400 mg Oral BID  . mupirocin ointment  1 application Nasal BID  . rosuvastatin  20 mg Oral QHS  . sodium chloride flush  3 mL Intravenous Q12H  . sodium chloride flush  3 mL Intravenous Q12H    Consultants:  Cardiology  Procedures:  Diagnostic cardiac cath  Antibiotics:   Anti-infectives (From admission, onward)   None       Objective   Vitals:   11/22/19 2040 11/23/19 0451 11/23/19 0600 11/23/19 1144  BP: (!) 116/47 (!) 148/79  (!) 149/75  Pulse: 70 69  70  Resp: 20 20  20   Temp: 99.9 F (37.7 C) 99.4 F (37.4 C)  98.4 F (36.9 C)  TempSrc: Oral Oral  Oral  SpO2: 100% 99%  97%  Weight:   132.9 kg   Height:        Intake/Output Summary (Last 24 hours) at 11/23/2019 1342 Last data filed at 11/23/2019 1000 Gross per 24 hour  Intake 563.87 ml  Output 626 ml  Net -62.13 ml    03/19 1901 - 03/21 0700 In: 1063.9 [P.O.:740; I.V.:323.9] Out: 1225 [Urine:1225]  Filed Weights   11/21/19 0255 11/22/19 0512 11/23/19 0600  Weight: 133.3 kg 133.2 kg 132.9 kg    Physical Examination:   General-appears in no acute distress Heart-S1-S2, regular, no murmur auscultated Lungs-clear to auscultation bilaterally, no wheezing or crackles auscultated Abdomen-soft, nontender, no organomegaly Extremities-trace edema noted in the lower extremities Neuro-alert, oriented x3, no focal deficit noted   Data Reviewed:   Recent Results (from the past 240 hour(s))  Respiratory Panel by RT PCR (Flu A&B, Covid) - Nasopharyngeal Swab     Status: None   Collection Time: 11/19/19 11:45 PM   Specimen: Nasopharyngeal Swab  Result Value Ref Range Status   SARS Coronavirus 2 by RT PCR NEGATIVE NEGATIVE Final    Comment: (NOTE) SARS-CoV-2 target nucleic acids are NOT DETECTED. The SARS-CoV-2 RNA is generally detectable in upper  respiratoy specimens during the acute phase of infection. The lowest concentration of SARS-CoV-2 viral copies this assay can detect is 131 copies/mL. A negative result does not preclude SARS-Cov-2 infection and should not be used as the sole basis for treatment or other patient management decisions. A negative result may occur with  improper specimen collection/handling, submission of specimen other than nasopharyngeal swab, presence of viral mutation(s) within the areas targeted by this assay, and inadequate number of viral copies (<131 copies/mL). A negative result must be combined with clinical observations, patient history, and epidemiological information. The expected result is Negative. Fact Sheet for Patients:  PinkCheek.be Fact Sheet for  Healthcare Providers:  GravelBags.it This test is not yet ap proved or cleared by the [place] and  has been authorized for detection and/or diagnosis of SARS-CoV-2 by FDA under an Emergency Use Authorization (EUA). This EUA will remain  in effect (meaning this test can be used) for the duration of the COVID-19 declaration under Section 564(b)(1) of the Act, 21 U.S.C. section 360bbb-3(b)(1), unless the authorization is terminated or revoked sooner.    Influenza A by PCR NEGATIVE NEGATIVE Final   Influenza B by PCR NEGATIVE NEGATIVE Final    Comment: (NOTE) The Xpert Xpress SARS-CoV-2/FLU/RSV assay is intended as an aid in  the diagnosis of influenza from Nasopharyngeal swab specimens and  should not be used as a sole basis for treatment. Nasal washings and  aspirates are unacceptable for Xpert Xpress SARS-CoV-2/FLU/RSV  testing. Fact Sheet for Patients: PinkCheek.be Fact Sheet for Healthcare Providers: GravelBags.it This test is not yet approved or cleared by the [place] FDA and  has been authorized for  detection and/or diagnosis of SARS-CoV-2 by  FDA under an Emergency Use Authorization (EUA). This EUA will remain  in effect (meaning this test can be used) for the duration of the  Covid-19 declaration under Section 564(b)(1) of the Act, 21  U.S.C. section 360bbb-3(b)(1), unless the authorization is  terminated or revoked. Performed at [facility], [place] [address] [place], [place] [address]   Surgical PCR screen     Status: None   Collection Time: 11/21/19  2:39 AM   Specimen: Nasal Mucosa; Nasal Swab  Result Value Ref Range Status   MRSA, PCR NEGATIVE NEGATIVE Final   Staphylococcus aureus NEGATIVE NEGATIVE Final    Comment: (NOTE) The Xpert SA Assay (FDA approved for NASAL specimens in patients 57 years of age and older), is one component of a comprehensive surveillance program. It is not intended to diagnose infection nor to guide or monitor treatment. Performed at [facility], [place] [address], [place], [place] [address]      Studies:  No results found.   Admission status: The appropriate admission status for this patient is INPATIENT. Inpatient status is judged to be reasonable and necessary in order to provide the required intensity of service to ensure the patient's safety. The patient's presenting symptoms, physical exam findings, and initial radiographic and laboratory data in the context of their chronic comorbidities is felt to place them at high risk for further clinical deterioration. Furthermore, it is not anticipated that the patient will be medically stable for discharge from the hospital within 2 midnights of admission. The following factors support the admission status of inpatient.    The patient's presenting symptoms include chest pain The worrisome physical exam findings include  The initial radiographic and laboratory data are worrisome because of elevated troponin, left circumflex stenosis seen on cardiac cath The chronic co-morbidities include  obesity, hypertension, CAD    * I certify that at the point of admission it is my clinical judgment that the patient will require inpatient hospital care spanning beyond 2 midnights from the point of admission due to high intensity of service, high risk for further deterioration and high frequency of surveillance required.Oswald Hillock   [facility] If 7PM-7AM, please contact night-coverage at [email or web address], Office  [phone]   11/23/2019, 1:42 PM  LOS: 3 days

## 2019-11-23 NOTE — Progress Notes (Signed)
Progress Note  Patient Name: Vernon Briggs Date of Encounter: 11/23/2019  Primary Cardiologist: No primary care provider on file.   Subjective   Breathing is fair  No CP   Inpatient Medications    Scheduled Meds: . aspirin  81 mg Oral Daily  . carvedilol  3.125 mg Oral BID WC  . clopidogrel  75 mg Oral Daily  . donepezil  5 mg Oral QHS  . FLUoxetine  40 mg Oral QHS  . furosemide  40 mg Oral Daily  . icosapent Ethyl  2 g Oral BID  . insulin aspart  0-20 Units Subcutaneous TID WC  . insulin aspart  0-5 Units Subcutaneous QHS  . insulin glargine  20 Units Subcutaneous QHS  . magnesium oxide  400 mg Oral BID  . mupirocin ointment  1 application Nasal BID  . rosuvastatin  20 mg Oral QHS  . sodium chloride flush  3 mL Intravenous Q12H  . sodium chloride flush  3 mL Intravenous Q12H   Continuous Infusions: . sodium chloride    . heparin 1,800 Units/hr (11/23/19 0156)   PRN Meds: sodium chloride, acetaminophen **OR** acetaminophen, hydrALAZINE, nitroGLYCERIN, ondansetron **OR** ondansetron (ZOFRAN) IV, sodium chloride flush   Vital Signs    Vitals:   11/22/19 1725 11/22/19 2040 11/23/19 0451 11/23/19 0600  BP: (!) 156/66 (!) 116/47 (!) 148/79   Pulse: 65 70 69   Resp: 20 20 20    Temp: 98.4 F (36.9 C) 99.9 F (37.7 C) 99.4 F (37.4 C)   TempSrc: Oral Oral Oral   SpO2: 100% 100% 99%   Weight:    132.9 kg  Height:        Intake/Output Summary (Last 24 hours) at 11/23/2019 0855 Last data filed at 11/23/2019 0602 Gross per 24 hour  Intake 1063.87 ml  Output 825 ml  Net 238.87 ml   I/O  ? Complete  + 860    Filed Weights   11/21/19 0255 11/22/19 0512 11/23/19 0600  Weight: 133.3 kg 133.2 kg 132.9 kg    Telemetry    SR  Paced - Personally Reviewed  ECG    Not done today  Personally Reviewed  Physical Exam   GEN: Morbidly obese 74 yo laying in bed  IN NAD  Neck Neck is full   Cardiac: RRR, no murmurs, rubs, or gallops. Distant  Respiratory: REl  clear   GI: Soft, nontender Distended   MS: 1+ edema; No deformity. Neuro:  Nonfocal  Psych: Flat affect   Labs    Chemistry Recent Labs  Lab 11/20/19 0336 11/20/19 0336 11/21/19 0329 11/22/19 0343 11/23/19 0403  NA 141   < > 137 136 137  K 4.4   < > 4.7 4.6 4.7  CL 104   < > 101 101 100  CO2 28   < > 24 23 25   GLUCOSE 211*   < > 314* 265* 262*  BUN 45*   < > 46* 47* 54*  CREATININE 1.83*   < > 1.86* 1.77* 1.85*  CALCIUM 8.8*   < > 8.8* 8.6* 8.8*  PROT 5.8*  --   --   --   --   ALBUMIN 3.5  --   --   --   --   AST 27  --   --   --   --   ALT 21  --   --   --   --   ALKPHOS 77  --   --   --   --  BILITOT 0.9  --   --   --   --   GFRNONAA 35*   < > 35* 37* 35*  GFRAA 41*   < > 40* 43* 40*  ANIONGAP 9   < > 12 12 12    < > = values in this interval not displayed.     Hematology Recent Labs  Lab 11/21/19 0329 11/22/19 0343 11/23/19 0403  WBC 8.4 8.4 8.3  RBC 3.85* 3.66* 3.56*  HGB 12.8* 12.4* 11.7*  HCT 38.6* 36.8* 35.6*  MCV 100.3* 100.5* 100.0  MCH 33.2 33.9 32.9  MCHC 33.2 33.7 32.9  RDW 14.2 14.2 14.0  PLT 69* 62* 67*    Cardiac EnzymesNo results for input(s): TROPONINI in the last 168 hours. No results for input(s): TROPIPOC in the last 168 hours.   BNPNo results for input(s): BNP, PROBNP in the last 168 hours.   DDimer  Recent Labs  Lab 11/20/19 [id number]  DDIMER 0.69*     Radiology    CARDIAC CATHETERIZATION  Result Date: 11/21/2019  Prox RCA to Mid RCA lesion is 100% stenosed.  Prox Cx to Dist Cx lesion is 10% stenosed.  Mid LM lesion is 90% stenosed.  Ost LAD to Prox LAD lesion is 100% stenosed.  LIMA graft was visualized by angiography and is normal in caliber.  SVG graft was not visualized.  Origin to Prox Graft lesion is 100% stenosed.  SVG graft was not visualized.  Origin to Prox Graft lesion is 100% stenosed.  SVG graft was not visualized.  Origin to Prox Graft lesion is 100% stenosed.  1. Severe left main stenosis. The left main  supplies the Circumflex only as the LAD is occluded. 2. Chronic occlusion of the proximal LAD. The mid and distal LAD fills from the patent LIMA graft 3. Chronic occlusion of intermediate branch. Chronic occlusion of the SVG to intermediate branch 4. Chronic occlusion SVG to OM 5. Chronic occlusion mid RCA. The RCA fills from left to right collaterals supplied by the LAD through the LIMA. The SVG to the RCA is chronically occluded. Recommendations: He has had progression of his left main disease. This is felt to be his culprit vessel. His left main supplies the Circumflex artery. The LAD is chronically occluded and fills from the patent LIMA graft. The RCA fills from left to right collaterals. I think he will need PCI of the left main lesion which is essentially a proximal Circumflex stenosis since the LAD is occluded and protected by the graft. Given his CKD, this was a diagnostic only cath. Will hydrate over the weekend. Given elevated LVEDP, will gently diurese. Continue ASA and Plavix. Resume IV heparin 6 hours post sheath pull. Will plan PCI/stenting of the left main artery on Monday. Will review films with the IC team. I do not think atherectomy is indicated prior to stenting.    Cardiac Studies   Relevant CV Studies:  LHC 3/19    Prox RCA to Mid RCA lesion is 100% stenosed.  Prox Cx to Dist Cx lesion is 10% stenosed.  Mid LM lesion is 90% stenosed.  Ost LAD to Prox LAD lesion is 100% stenosed.  LIMA graft was visualized by angiography and is normal in caliber.  SVG graft was not visualized.  Origin to Prox Graft lesion is 100% stenosed.  SVG graft was not visualized.  Origin to Prox Graft lesion is 100% stenosed.  SVG graft was not visualized.  Origin to Prox Graft lesion is 100% stenosed.  1. Severe left main stenosis. The left main supplies the Circumflex only as the LAD is occluded.  2. Chronic occlusion of the proximal LAD. The mid and distal LAD fills from the patent  LIMA graft 3. Chronic occlusion of intermediate branch. Chronic occlusion of the SVG to intermediate branch 4. Chronic occlusion SVG to OM 5. Chronic occlusion mid RCA. The RCA fills from left to right collaterals supplied by the LAD through the LIMA. The SVG to the RCA is chronically occluded.   Recommendations: He has had progression of his left main disease. This is felt to be his culprit vessel. His left main supplies the Circumflex artery. The LAD is chronically occluded and fills from the patent LIMA graft. The RCA fills from left to right collaterals. I think he will need PCI of the left main lesion which is essentially a proximal Circumflex stenosis since the LAD is occluded and protected by the graft. Given his CKD, this was a diagnostic only cath.  Will hydrate over the weekend. Given elevated LVEDP, will gently diurese.  Continue ASA and Plavix. Resume IV heparin 6 hours post sheath pull.  Will plan PCI/stenting of the left main artery on Monday. Will review films with the IC team. I do not think atherectomy is indicated prior to stenting.      Echocardiogram 09/09/2014:  Study Conclusions   - Left ventricle: The cavity size was normal. Wall thickness was  increased in a pattern of mild LVH. Systolic function was  moderately reduced. The estimated ejection fraction was in the  range of 35% to 40%. Akinesis of the midanteroseptal and anterior  myocardium. Doppler parameters are consistent with abnormal left  ventricular relaxation (grade 1 diastolic dysfunction).  Indeterminate mean left atrial filling pressures.  - Aortic valve: There was mild regurgitation.  - Mitral valve: Calcified annulus.  - Left atrium: The atrium was mildly dilated.   Impressions:   - Little change on direct comparison with study from February 2014.   TTE 10/21/12 Study Conclusions  - Left ventricle: The cavity size was normal. There was moderate concentric hypertrophy. Systolic  function was mildly to moderately reduced. The estimated ejection fraction was in the range of 40% to 45%. There is akinesis of the mid-distalanteroseptal myocardium. - Mitral valve: Mild regurgitation. - Left atrium: The atrium was mildly dilated. - Tricuspid valve: Moderate regurgitation. - Pulmonary arteries: PA peak pressure: 18mm Hg (S).    Patient Profile     74 y.o. male with a hx of CAD status post CABG x4 12/21/1997 [facility] with LIMA to LAD, SVG to dRCA, SVG to OM1, SVG to RI) with PCI x6 in approximately 2011 ([place]) with most recent PCI/DES to LCx in 2016 at [facility], CKD stage III, DM 2, hypertension, hyperlipidemia, obesity, PAF on Eliquis, prior left frontal CVA and ICM s/p ICD in 2013 who is being seen  for the evaluation of chest pain at the request of Dr. Hal Hope.   Assessment & Plan    1.  Chest pain/NSTEMI/CAD  REmote CABG 1999 -last PCI was in 2016 and CP similar to then -he has significant hx of CAD with last cath in 2016 with SVGs occluded and severe native CAD and patent LIMA>LAD s/p PCI of the LCx  L heart cath as noted above done  Friday    LM with severe stenosos  Felt culprit lesion Plan for intervention early next week   Plan over weekend was to finish hydration post procedure and diurese  Reasess labs in am  Keep on ASA , Plavix, Crestor    3.  Rhythm   -maintaining NSR   Paced   Off of Eliuqis  ON Heparin    -  4.  HLD -LDL goal < 70 -this admit LDL 22 but TAGS 379 -increase crestor to 20mg  daily and add Vascepa 2gm BID -Will need repeat lipids  in 6 weeks  5.  DM2 -per TRH -HbA1C 8 -continue SS Insulin  6.  HTN -BP labile   Some meds held with cath    -on carvedilol 3.125mg  BID, Hydralazine 50mg  TID, HCTZ 25mg  daily, Lisinopril 40mg  daily at home -ACE I, hydralazine and diuretics on hold to allow adequate BP for renal perfusion  7.  Chronic combined systolic/diastolic CHF Will give 80 mg once   Follow response  BMET in AM     8.  CKD stage 3b -creatinine 1.85     9.  Thrombocytopenia -chronic problem  PLt 67 K   Will need to review what meds to go home on after intervention   Not triple Rx           For questions or updates, please contact [place] Please consult [email or web address] for contact info under Cardiology/STEMI.      Signed, Dorris Carnes, MD  11/23/2019, 8:55 AM

## 2019-11-24 ENCOUNTER — Encounter (HOSPITAL_COMMUNITY)
Admission: EM | Disposition: A | Discharge status: Home Health | Payer: Self-pay | Source: Home / Self Care | Attending: Family Medicine

## 2019-11-24 DIAGNOSIS — I5021 Acute systolic (congestive) heart failure: Secondary | ICD-10-CM

## 2019-11-24 DIAGNOSIS — E785 Hyperlipidemia, unspecified: Secondary | ICD-10-CM

## 2019-11-24 HISTORY — PX: INTRAVASCULAR ULTRASOUND/IVUS: CATH118244

## 2019-11-24 HISTORY — PX: LEFT HEART CATH: CATH118248

## 2019-11-24 HISTORY — PX: CORONARY STENT INTERVENTION: CATH118234

## 2019-11-24 LAB — CBC
HCT: 34.1 % — ABNORMAL LOW (ref 39.0–52.0)
Hemoglobin: 11.3 g/dL — ABNORMAL LOW (ref 13.0–17.0)
MCH: 33.2 pg (ref 26.0–34.0)
MCHC: 33.1 g/dL (ref 30.0–36.0)
MCV: 100.3 fL — ABNORMAL HIGH (ref 80.0–100.0)
Platelets: 69 10*3/uL — ABNORMAL LOW (ref 150–400)
RBC: 3.4 MIL/uL — ABNORMAL LOW (ref 4.22–5.81)
RDW: 14.1 % (ref 11.5–15.5)
WBC: 6.2 10*3/uL (ref 4.0–10.5)
nRBC: 0 % (ref 0.0–0.2)

## 2019-11-24 LAB — BASIC METABOLIC PANEL
Anion gap: 10 (ref 5–15)
BUN: 65 mg/dL — ABNORMAL HIGH (ref 8–23)
CO2: 25 mmol/L (ref 22–32)
Calcium: 8.6 mg/dL — ABNORMAL LOW (ref 8.9–10.3)
Chloride: 102 mmol/L (ref 98–111)
Creatinine, Ser: 1.98 mg/dL — ABNORMAL HIGH (ref 0.61–1.24)
GFR calc Af Amer: 37 mL/min — ABNORMAL LOW (ref >60)
GFR calc non Af Amer: 32 mL/min — ABNORMAL LOW (ref >60)
Glucose, Bld: 292 mg/dL — ABNORMAL HIGH (ref 70–99)
Potassium: 4.5 mmol/L (ref 3.5–5.1)
Sodium: 137 mmol/L (ref 135–145)

## 2019-11-24 LAB — GLUCOSE, CAPILLARY
Glucose-Capillary: 199 mg/dL — ABNORMAL HIGH (ref 70–99)
Glucose-Capillary: 238 mg/dL — ABNORMAL HIGH (ref 70–99)
Glucose-Capillary: 258 mg/dL — ABNORMAL HIGH (ref 70–99)
Glucose-Capillary: 275 mg/dL — ABNORMAL HIGH (ref 70–99)
Glucose-Capillary: 279 mg/dL — ABNORMAL HIGH (ref 70–99)

## 2019-11-24 LAB — HEPARIN LEVEL (UNFRACTIONATED): Heparin Unfractionated: 0.27 IU/mL — ABNORMAL LOW (ref 0.30–0.70)

## 2019-11-24 LAB — POCT ACTIVATED CLOTTING TIME: Activated Clotting Time: 417 seconds

## 2019-11-24 SURGERY — CORONARY STENT INTERVENTION
Anesthesia: LOCAL

## 2019-11-24 MED ORDER — ASPIRIN 81 MG PO CHEW
81.0000 mg | CHEWABLE_TABLET | ORAL | Status: AC
  Administered 2019-11-24: 81 mg via ORAL
  Filled 2019-11-24: qty 1

## 2019-11-24 MED ORDER — MIDAZOLAM HCL 2 MG/2ML IJ SOLN
INTRAMUSCULAR | Status: AC
  Filled 2019-11-24: qty 2

## 2019-11-24 MED ORDER — HEPARIN (PORCINE) 25000 UT/250ML-% IV SOLN
1950.0000 [IU]/h | INTRAVENOUS | Status: DC
  Administered 2019-11-24: 1950 [IU]/h via INTRAVENOUS
  Filled 2019-11-24: qty 250

## 2019-11-24 MED ORDER — HEPARIN (PORCINE) IN NACL 1000-0.9 UT/500ML-% IV SOLN
INTRAVENOUS | Status: AC
  Filled 2019-11-24: qty 1000

## 2019-11-24 MED ORDER — LIDOCAINE HCL (PF) 1 % IJ SOLN
INTRAMUSCULAR | Status: AC
  Filled 2019-11-24: qty 30

## 2019-11-24 MED ORDER — LIDOCAINE HCL (PF) 1 % IJ SOLN
INTRAMUSCULAR | Status: DC | PRN
  Administered 2019-11-24: 2 mL via INTRADERMAL

## 2019-11-24 MED ORDER — IOHEXOL 350 MG/ML SOLN
INTRAVENOUS | Status: AC
  Filled 2019-11-24: qty 1

## 2019-11-24 MED ORDER — SODIUM CHLORIDE 0.9% FLUSH
3.0000 mL | Freq: Two times a day (BID) | INTRAVENOUS | Status: DC
  Administered 2019-11-24 – 2019-11-26 (×4): 3 mL via INTRAVENOUS

## 2019-11-24 MED ORDER — IOHEXOL 350 MG/ML SOLN
INTRAVENOUS | Status: DC | PRN
  Administered 2019-11-24: 40 mL

## 2019-11-24 MED ORDER — SODIUM CHLORIDE 0.9 % IV SOLN
INTRAVENOUS | Status: DC | PRN
  Administered 2019-11-24 (×2): 1.75 mg/kg/h via INTRAVENOUS

## 2019-11-24 MED ORDER — SODIUM CHLORIDE 0.9 % IV SOLN: INTRAVENOUS | Status: DC

## 2019-11-24 MED ORDER — SODIUM CHLORIDE 0.9% FLUSH: 3.0000 mL | INTRAVENOUS | Status: DC | PRN

## 2019-11-24 MED ORDER — FUROSEMIDE 10 MG/ML IJ SOLN
80.0000 mg | Freq: Once | INTRAMUSCULAR | Status: AC
  Administered 2019-11-24: 23:00:00 80 mg via INTRAVENOUS
  Filled 2019-11-24: qty 8

## 2019-11-24 MED ORDER — HYDRALAZINE HCL 20 MG/ML IJ SOLN: 10.0000 mg | INTRAMUSCULAR | Status: AC | PRN

## 2019-11-24 MED ORDER — BIVALIRUDIN BOLUS VIA INFUSION - CUPID
INTRAVENOUS | Status: DC | PRN
  Administered 2019-11-24: 99.6 mg via INTRAVENOUS

## 2019-11-24 MED ORDER — FENTANYL CITRATE (PF) 100 MCG/2ML IJ SOLN
INTRAMUSCULAR | Status: AC
  Filled 2019-11-24: qty 2

## 2019-11-24 MED ORDER — HEPARIN (PORCINE) IN NACL 1000-0.9 UT/500ML-% IV SOLN
INTRAVENOUS | Status: DC | PRN
  Administered 2019-11-24 (×2): 500 mL

## 2019-11-24 MED ORDER — SODIUM CHLORIDE 0.9 % IV SOLN: 250.0000 mL | INTRAVENOUS | Status: DC | PRN

## 2019-11-24 MED ORDER — BIVALIRUDIN TRIFLUOROACETATE 250 MG IV SOLR
INTRAVENOUS | Status: AC
  Filled 2019-11-24: qty 250

## 2019-11-24 MED ORDER — VERAPAMIL HCL 2.5 MG/ML IV SOLN
INTRAVENOUS | Status: AC
  Filled 2019-11-24: qty 2

## 2019-11-24 MED ORDER — VERAPAMIL HCL 2.5 MG/ML IV SOLN
INTRAVENOUS | Status: DC | PRN
  Administered 2019-11-24: 10 mL via INTRA_ARTERIAL

## 2019-11-24 MED ORDER — SODIUM CHLORIDE 0.9% FLUSH
3.0000 mL | Freq: Two times a day (BID) | INTRAVENOUS | Status: DC
  Administered 2019-11-24: 3 mL via INTRAVENOUS

## 2019-11-24 MED ORDER — CLOPIDOGREL BISULFATE 300 MG PO TABS
ORAL_TABLET | ORAL | Status: AC
  Filled 2019-11-24: qty 1

## 2019-11-24 MED ORDER — FENTANYL CITRATE (PF) 100 MCG/2ML IJ SOLN
INTRAMUSCULAR | Status: DC | PRN
  Administered 2019-11-24: 25 ug via INTRAVENOUS

## 2019-11-24 MED ORDER — MIDAZOLAM HCL 2 MG/2ML IJ SOLN
INTRAMUSCULAR | Status: DC | PRN
  Administered 2019-11-24: 1 mg via INTRAVENOUS

## 2019-11-24 MED ORDER — HEPARIN SODIUM (PORCINE) 1000 UNIT/ML IJ SOLN
INTRAMUSCULAR | Status: AC
  Filled 2019-11-24: qty 1

## 2019-11-24 MED ORDER — NITROGLYCERIN 1 MG/10 ML FOR IR/CATH LAB
INTRA_ARTERIAL | Status: AC
  Filled 2019-11-24: qty 10

## 2019-11-24 MED ORDER — CLOPIDOGREL BISULFATE 300 MG PO TABS
ORAL_TABLET | ORAL | Status: DC | PRN
  Administered 2019-11-24: 300 mg via ORAL

## 2019-11-24 SURGICAL SUPPLY — 25 items
BALLN SAPPHIRE 2.5X15 (BALLOONS) ×2
BALLN SAPPHIRE ~~LOC~~ 4.0X15 (BALLOONS) ×1 IMPLANT
BALLN ~~LOC~~ SAPPHIRE 4.5X8 (BALLOONS) ×2
BALLOON SAPPHIRE 2.5X15 (BALLOONS) IMPLANT
BALLOON ~~LOC~~ SAPPHIRE 4.5X8 (BALLOONS) IMPLANT
CATH INFINITI JR4 5F (CATHETERS) ×1 IMPLANT
CATH OPTICROSS HD (CATHETERS) ×1 IMPLANT
CATH VISTA GUIDE 6FR XBLAD3.5 (CATHETERS) ×1 IMPLANT
DEVICE RAD COMP TR BAND LRG (VASCULAR PRODUCTS) ×1 IMPLANT
ELECT DEFIB PAD ADLT CADENCE (PAD) ×1 IMPLANT
GLIDESHEATH SLEND SS 6F .021 (SHEATH) ×1 IMPLANT
GUIDEWIRE INQWIRE 1.5J.035X260 (WIRE) IMPLANT
HOVERMATT SINGLE USE (MISCELLANEOUS) ×1 IMPLANT
INQWIRE 1.5J .035X260CM (WIRE) ×2
KIT ENCORE 26 ADVANTAGE (KITS) ×1 IMPLANT
KIT HEART LEFT (KITS) ×2 IMPLANT
PACK CARDIAC CATHETERIZATION (CUSTOM PROCEDURE TRAY) ×2 IMPLANT
PAD PRO RADIOLUCENT 2001M-C (PAD) ×1 IMPLANT
SLED PULL BACK IVUS (MISCELLANEOUS) ×1 IMPLANT
STENT SYNERGY XD 3.50X24 (Permanent Stent) IMPLANT
SYNERGY XD 3.50X24 (Permanent Stent) ×2 IMPLANT
TRANSDUCER W/STOPCOCK (MISCELLANEOUS) ×2 IMPLANT
TUBING CIL FLEX 10 FLL-RA (TUBING) ×2 IMPLANT
WIRE ASAHI GRAND SLAM 180CM (WIRE) ×1 IMPLANT
WIRE RUNTHROUGH .014X180CM (WIRE) ×1 IMPLANT

## 2019-11-24 NOTE — Plan of Care (Signed)
  Problem: Activity: Goal: Risk for activity intolerance will decrease Outcome: Progressing   Problem: Safety: Goal: Ability to remain free from injury will improve Outcome: Progressing   Problem: Activity: Goal: Capacity to carry out activities will improve Outcome: Progressing   

## 2019-11-24 NOTE — Progress Notes (Signed)
Inpatient Diabetes Program Recommendations  AACE/ADA: New Consensus Statement on Inpatient Glycemic Control (2015)  Target Ranges:  Prepandial:   less than 140 mg/dL      Peak postprandial:   less than 180 mg/dL (1-2 hours)      Critically ill patients:  140 - 180 mg/dL   Lab Results  Component Value Date   GLUCAP 275 (H) 11/24/2019   HGBA1C 8.0 (H) 11/20/2019    Review of Glycemic Control Results for KRIZ, CANEDY (MRN [id number]) as of 11/24/2019 11:46  Ref. Range 11/23/2019 17:17 11/23/2019 21:02 11/24/2019 00:02 11/24/2019 06:23 11/24/2019 11:28  Glucose-Capillary Latest Ref Range: 70 - 99 mg/dL 229 (H) 302 (H) 279 (H) 275 (H) 258 (H)    Inpatient Diabetes Program Recommendations:  Please consider ordering:  -Novolog 8 units TID with meals for meal coverage if patient eats at least 50% of meals. Secure chat to Dr. Georgiann Mohs with recommendations.  Thank you, Nani Gasser. Demarkis Gheen, RN, MSN, CDE  Diabetes Coordinator Inpatient Glycemic Control Team Team Pager [phone] (8am-5pm) 11/24/2019 11:46 AM

## 2019-11-24 NOTE — H&P (View-Only) (Signed)
Progress Note  Patient Name: Vernon Briggs Date of Encounter: 11/24/2019  Primary Cardiologist: Fransico Him, MD   Subjective   No chest pain or SOB.   Inpatient Medications    Scheduled Meds: . aspirin  81 mg Oral Daily  . carvedilol  3.125 mg Oral BID WC  . clopidogrel  75 mg Oral Daily  . donepezil  5 mg Oral QHS  . FLUoxetine  40 mg Oral QHS  . icosapent Ethyl  2 g Oral BID  . insulin aspart  0-20 Units Subcutaneous TID WC  . insulin aspart  0-5 Units Subcutaneous QHS  . insulin glargine  30 Units Subcutaneous QHS  . magnesium oxide  400 mg Oral BID  . mupirocin ointment  1 application Nasal BID  . rosuvastatin  20 mg Oral QHS  . sodium chloride flush  3 mL Intravenous Q12H  . sodium chloride flush  3 mL Intravenous Q12H  . sodium chloride flush  3 mL Intravenous Q12H   Continuous Infusions: . sodium chloride    . sodium chloride    . sodium chloride 100 mL/hr at 11/24/19 [id number]  . heparin 1,800 Units/hr (11/23/19 0156)   PRN Meds: sodium chloride, sodium chloride, acetaminophen **OR** acetaminophen, hydrALAZINE, nitroGLYCERIN, ondansetron **OR** ondansetron (ZOFRAN) IV, sodium chloride flush, sodium chloride flush   Vital Signs    Vitals:   11/23/19 1144 11/23/19 1719 11/23/19 2039 11/24/19 0430  BP: (!) 149/75 139/70 (!) 160/70 140/72  Pulse: 70 65 68 64  Resp: 20 20 18 20   Temp: 98.4 F (36.9 C) 98.2 F (36.8 C) 98.8 F (37.1 C) 98.2 F (36.8 C)  TempSrc: Oral Oral Oral Oral  SpO2: 97% 100% 100% 100%  Weight:    132.8 kg  Height:        Intake/Output Summary (Last 24 hours) at 11/24/2019 0851 Last data filed at 11/24/2019 [id number] Gross per 24 hour  Intake 990.26 ml  Output 401 ml  Net 589.26 ml   Last 3 Weights 11/24/2019 11/23/2019 11/22/2019  Weight (lbs) 292 lb 12.8 oz 293 lb 293 lb 9.6 oz  Weight (kg) 132.813 kg 132.904 kg 133.176 kg      Telemetry    V pacing - Personally Reviewed  ECG    No new - Personally Reviewed  Physical Exam     GEN: No acute distress.  Resting with CPAP Neck: No JVD Cardiac: RRR, no murmurs, rubs, or gallops.  Respiratory: Clear to auscultation bilaterally. GI: Soft, nontender, non-distended  MS: 2+ edema of feet; No deformity. Neuro:  Nonfocal  Psych: Normal affect   Labs    High Sensitivity Troponin:   Recent Labs  Lab 11/19/19 2207 11/20/19 0007 11/20/19 0336 11/20/19 0611  TROPONINIHS 55* 95* 314* 485*      Chemistry Recent Labs  Lab 11/20/19 0336 11/21/19 0329 11/22/19 0343 11/23/19 0403 11/24/19 0517  NA 141   < > 136 137 137  K 4.4   < > 4.6 4.7 4.5  CL 104   < > 101 100 102  CO2 28   < > 23 25 25   GLUCOSE 211*   < > 265* 262* 292*  BUN 45*   < > 47* 54* 65*  CREATININE 1.83*   < > 1.77* 1.85* 1.98*  CALCIUM 8.8*   < > 8.6* 8.8* 8.6*  PROT 5.8*  --   --   --   --   ALBUMIN 3.5  --   --   --   --  AST 27  --   --   --   --   ALT 21  --   --   --   --   ALKPHOS 77  --   --   --   --   BILITOT 0.9  --   --   --   --   GFRNONAA 35*   < > 37* 35* 32*  GFRAA 41*   < > 43* 40* 37*  ANIONGAP 9   < > 12 12 10    < > = values in this interval not displayed.     Hematology Recent Labs  Lab 11/22/19 0343 11/23/19 0403 11/24/19 0517  WBC 8.4 8.3 6.2  RBC 3.66* 3.56* 3.40*  HGB 12.4* 11.7* 11.3*  HCT 36.8* 35.6* 34.1*  MCV 100.5* 100.0 100.3*  MCH 33.9 32.9 33.2  MCHC 33.7 32.9 33.1  RDW 14.2 14.0 14.1  PLT 62* 67* 69*    BNPNo results for input(s): BNP, PROBNP in the last 168 hours.   DDimer  Recent Labs  Lab 11/20/19 [phone]  DDIMER 0.69*     Radiology    No results found.  Cardiac Studies   CARDIAC CATHETERIZATION  Result Date: 11/21/2019  Prox RCA to Mid RCA lesion is 100% stenosed.  Prox Cx to Dist Cx lesion is 10% stenosed.  Mid LM lesion is 90% stenosed.  Ost LAD to Prox LAD lesion is 100% stenosed.  LIMA graft was visualized by angiography and is normal in caliber.  SVG graft was not visualized.  Origin to Prox Graft lesion  is 100% stenosed.  SVG graft was not visualized.  Origin to Prox Graft lesion is 100% stenosed.  SVG graft was not visualized.  Origin to Prox Graft lesion is 100% stenosed.   1. Severe left main stenosis. The left main supplies the Circumflex only as the LAD is occluded.  2. Chronic occlusion of the proximal LAD. The mid and distal LAD fills from the patent LIMA graft 3. Chronic occlusion of intermediate branch. Chronic occlusion of the SVG to intermediate branch 4. Chronic occlusion SVG to OM 5. Chronic occlusion mid RCA. The RCA fills from left to right collaterals supplied by the LAD through the LIMA. The SVG to the RCA is chronically occluded.   Recommendations: He has had progression of his left main disease. This is felt to be his culprit vessel. His left main supplies the Circumflex artery. The LAD is chronically occluded and fills from the patent LIMA graft. The RCA fills from left to right collaterals. I think he will need PCI of the left main lesion which is essentially a proximal Circumflex stenosis since the LAD is occluded and protected by the graft. Given his CKD, this was a diagnostic only cath.  Will hydrate over the weekend. Given elevated LVEDP, will gently diurese.  Continue ASA and Plavix. Resume IV heparin 6 hours post sheath pull.  Will plan PCI/stenting of the left main artery on Monday. Will review films with the IC team. I do not think atherectomy is indicated prior to stenting.   Diagnostic Dominance: Right     Echo 11/20/19 IMPRESSIONS    1. Left ventricular ejection fraction, by estimation, is 25 to 30%. The  left ventricle has severely decreased function. The left ventricle  demonstrates regional wall motion abnormalities (see scoring  diagram/findings for description). Left ventricular  diastolic function could not be evaluated.  2. Right ventricular systolic function is mildly reduced. The right  ventricular size is  mildly enlarged. There is  mildly elevated pulmonary  artery systolic pressure. The estimated right ventricular systolic  pressure is 123XX123 mmHg.  3. The mitral valve is grossly normal. Mild mitral valve regurgitation.  No evidence of mitral stenosis.  4. Tricuspid valve regurgitation is mild to moderate.  5. The aortic valve is tricuspid. Aortic valve regurgitation is not  visualized. No aortic stenosis is present.  6. Difficult study with poor windows despite contrast. LVEF severely  reduced 25-30%. Mid septum into apex is akinetic consistent with prior  infarction. No LV thrombus on contrast imaging.   FINDINGS  Left Ventricle: Left ventricular ejection fraction, by estimation, is 25  to 30%. The left ventricle has severely decreased function. The left  ventricle demonstrates regional wall motion abnormalities. Definity  contrast agent was given IV to delineate  the left ventricular endocardial borders. The left ventricular internal  cavity size was normal in size. There is no left ventricular hypertrophy.  Left ventricular diastolic function could not be evaluated due to paced  rhythm. Left ventricular diastolic  function could not be evaluated.     LV Wall Scoring:  The mid and distal anterior septum, apical anterior segment, and apex are  akinetic.   Right Ventricle: The right ventricular size is mildly enlarged. No  increase in right ventricular wall thickness. Right ventricular systolic  function is mildly reduced. There is mildly elevated pulmonary artery  systolic pressure. The tricuspid regurgitant  velocity is 3.00 m/s, and with an assumed right atrial pressure of 8  mmHg, the estimated right ventricular systolic pressure is 123XX123 mmHg.   Left Atrium: Left atrial size was normal in size.   Right Atrium: Right atrial size was normal in size.   Pericardium: There is no evidence of pericardial effusion. Presence of  pericardial fat pad.   Mitral Valve: The mitral valve is grossly normal.  Mild mitral valve  regurgitation. No evidence of mitral valve stenosis.   Tricuspid Valve: The tricuspid valve is grossly normal. Tricuspid valve  regurgitation is mild to moderate.   Aortic Valve: The aortic valve is tricuspid. Aortic valve regurgitation is  not visualized. No aortic stenosis is present.   Pulmonic Valve: The pulmonic valve was grossly normal. Pulmonic valve  regurgitation is not visualized. No evidence of pulmonic stenosis.   Aorta: The aortic root is normal in size and structure.   IAS/Shunts: No atrial level shunt detected by color flow Doppler.   Additional Comments: A pacer wire is visualized in the right atrium and  right ventricle.  Patient Profile     74 y.o. male with a hx of CAD status post CABG x4 12/21/1997 [facility] with LIMA to LAD, SVG to dRCA, SVG to OM1, SVG to RI) with PCI x6 in approximately 2011 ([place]) with most recent PCI/DES to LCx in 2016 at [facility], CKD stage III, DM 2, hypertension, hyperlipidemia, obesity, PAF on Eliquis, prior left frontal CVA and ICM s/p ICD in 2013 now admitted 11/19/19 with chest pain.  Hs troponin elevated   Assessment & Plan    1.  Chest pain/NSTEMI/CAD -concerning for ACS--now on IV heparin, eliquis on hold for cardiac PCI to LM today at 1330.  Cr today is 1.98 up from 1.85  -hstrop mildly elevated  -last PCI was in 2016 and CP similar to then -he has significant hx of CAD with last cath in 2016 with SVGs occluded and severe native CAD and patent LIMA>LAD s/p PCI of the LCx -ACE I and diuretics  on hold   2.  CAD -s/p remote CABG 1999 -s/p PCI in LCx 2016 -cath in 2016 with patent LIMA>LAD, occluded SVGs and severe native CAD - ASA 81mg  daily -continue Crestor 10mg  daily and Plavix 75mg  daily -continue carvedilol 3.125mg  BID -LHC with results as above and plan for PCI to LM today   3.  PAF -maintaining NSR with V pacing difficult to see P wave  -Eliquis on hold for cath  -last dose of Eliquis was pm 3/17 -IV  Heparin gtt given hx of CVA in past while off eliquis.  4.  HLD -LDL goal < 70 -LDL 22 ? Correct with elevated TG, TG 379, HDL 22, TChol 120 -continue Crestor 20mg  daily up form 10 mg, Vascepa at 2 g BID  5.  DM2 -per TRH - HbA1C 8 -continue SS Insulin  6.  HTN -BP labile held ACE and diuretics for cardiac cath.   -on carvedilol 3.125mg  BID, Hydralazine 50mg  TID, HCTZ 25mg  daily, Lisinopril 40mg  daily at home (160/70 to 140/72)   7.  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  CKD-3b  -Cr 1.98 today   9.  Thrombocytopenia  -Chronic plts today 69 was 97 on admit but lower since that time.   On ASA, plavix and heparin.  May not be able to discharge on ASA, plavix and eliquis. Continue to monitor.        For questions or updates, please contact [place] Please consult [email or web address] for contact info under        Signed, Cecilie Kicks, NP  11/24/2019, 8:51 AM    History and all data above reviewed.  Patient examined.  I agree with the findings as above.  He feels OK.  Says that his breathing is better than before.  The patient denies any new symptoms such as chest discomfort, neck or arm discomfort. There has been no new shortness of breath, PND or orthopnea. There have been no reported palpitations, presyncope or syncope.  The patient exam reveals COR:RRR  ,  Lungs: Clear  ,  Abd: Positive bowel sounds, no rebound no guarding , Ext Mild edema  .  All available labs, radiology testing, previous records reviewed. Agree with documented assessment and plan.  NSTEMI:  Needs PCI of LM into circ.  Discussed at length the risk of AKI with further contrast.  He understands and really wants to proceed.  We cannot hydrate and I am not convinced that we  can lower his risk with further delay.  Risk will be attenuated by limiting contrast.  Patient understands however the potential for progressive renal failure and even need for dialysis.   Jeneen Rinks [facility]  10:42 AM  11/24/2019

## 2019-11-24 NOTE — Progress Notes (Signed)
[facility]  PROGRESS NOTE  Vernon Briggs [id number] DOB: 1946/07/23 DOA: 11/19/2019 PCP: [name], MD   Patient admitted this morning by Dr. Hal Hope.  See detailed H&P.   Brief HPI:   74 year old male with a history of CAD s/p CABG and stenting in 2016, CKD stage III, diabetes mellitus type 2, hypertension, paroxysmal atrial fibrillation, ischemic cardiomyopathy status post AICD presented to the ED with complaints of chest pain.  Patient stated that he was having chest pain and shortness of breath for past 1 month.  In the ED EKG showed normal sinus rhythm with IVCD.  Chest x-ray was unremarkable.  COVID-19 test was negative.  Creatinine was 1.8 which increased from baseline around 1.6.  Patient mated with chest pain with concern for ACS.  Cardiology was consulted.    Subjective   Patient seen and examined, plan for cardiac cath and stenting today.  Breathing has improved since he got Lasix yesterday.  Creatinine 1.98 today.   Assessment/Plan:     1. Chest pain-patient has history of CAD s/p CABG, cardiology was consulted.  Patient underwent diagnostic cardiac cath.  Found to have stenosis of left circumflex.  No stenting was performed due to the renal insufficiency.  Patient's renal function is closely monitored, he will get IV Lasix as per cardiology.  Plan for repeat cardiac cath and stenting today.    Continue aspirin, Plavix, heparin.   2. Diabetes mellitus type 2-blood glucose is mildly elevated, patient takes concentrated Humulin R 55 to 110 units at home.  He has been started on resistant sliding scale.  Blood glucose is still elevated.  Will increase Lantus to 30 units subcu daily.      3. Hypertension-continue Coreg, blood pressure is well controlled.  Continue IV as needed hydralazine.  4. CKD stage III-patient baseline creatinine is around 1.3-1.5.  He presented with creatinine of 1. 8.  Today creatinine is 1. 98, patient received IV Lasix  yesterday.  5. Ischemic cardiomyopathy s/p AICD-continue aspirin, Plavix, heparin, Crestor.  6. Paroxysmal atrial fibrillation-continue Coreg, Coumadin.  7. Thrombocytopenia- chronic. Today platelet count is 69,000.  8. Sleep apnea-continue CPAP nightly    SpO2: 98 % O2 Flow Rate (L/min): 3 L/min   COVID-19 Labs  No results for input(s): DDIMER, FERRITIN, LDH, CRP in the last 72 hours.  Lab Results  Component Value Date   [place] NEGATIVE 11/19/2019     CBG: Recent Labs  Lab 11/23/19 1717 11/23/19 2102 11/24/19 0002 11/24/19 0623 11/24/19 1128  GLUCAP 229* 302* 279* 275* 258*    CBC: Recent Labs  Lab 11/20/19 0336 11/21/19 0329 11/22/19 0343 11/23/19 0403 11/24/19 0517  WBC 6.7 8.4 8.4 8.3 6.2  NEUTROABS 4.6  --   --   --   --   HGB 12.6* 12.8* 12.4* 11.7* 11.3*  HCT 37.8* 38.6* 36.8* 35.6* 34.1*  MCV 99.2 100.3* 100.5* 100.0 100.3*  PLT 76* 69* 62* 67* 69*    Basic Metabolic Panel: Recent Labs  Lab 11/20/19 0336 11/21/19 0329 11/22/19 0343 11/23/19 0403 11/24/19 0517  NA 141 137 136 137 137  K 4.4 4.7 4.6 4.7 4.5  CL 104 101 101 100 102  CO2 28 24 23 25 25   GLUCOSE 211* 314* 265* 262* 292*  BUN 45* 46* 47* 54* 65*  CREATININE 1.83* 1.86* 1.77* 1.85* 1.98*  CALCIUM 8.8* 8.8* 8.6* 8.8* 8.6*     Liver Function Tests: Recent Labs  Lab 11/20/19 0336  AST 27  ALT 21  ALKPHOS  64  BILITOT 0.9  PROT 5.8*  ALBUMIN 3.5        DVT prophylaxis: Heparin  Code Status: Full code  Family Communication: No family at bedside  Disposition Plan: Patient admitted with chest pain, concern for ACS.  Underwent cardiac cath.  Barrier to discharge-he will need repeat procedure, cardiac cath with stenting on Monday.     Scheduled medications:  . [MAR Hold] aspirin  81 mg Oral Daily  . [MAR Hold] carvedilol  3.125 mg Oral BID WC  . [MAR Hold] clopidogrel  75 mg Oral Daily  . [MAR Hold] donepezil  5 mg Oral QHS  . [MAR Hold] FLUoxetine  40  mg Oral QHS  . [MAR Hold] icosapent Ethyl  2 g Oral BID  . [MAR Hold] insulin aspart  0-20 Units Subcutaneous TID WC  . [MAR Hold] insulin aspart  0-5 Units Subcutaneous QHS  . [MAR Hold] insulin glargine  30 Units Subcutaneous QHS  . [MAR Hold] magnesium oxide  400 mg Oral BID  . [MAR Hold] mupirocin ointment  1 application Nasal BID  . [MAR Hold] rosuvastatin  20 mg Oral QHS  . [MAR Hold] sodium chloride flush  3 mL Intravenous Q12H  . [MAR Hold] sodium chloride flush  3 mL Intravenous Q12H  . sodium chloride flush  3 mL Intravenous Q12H    Consultants:  Cardiology  Procedures:  Diagnostic cardiac cath  Antibiotics:   Anti-infectives (From admission, onward)   None       Objective   Vitals:   11/23/19 2039 11/24/19 0430 11/24/19 1127 11/24/19 1355  BP: (!) 160/70 140/72 (!) 111/58   Pulse: 68 64 61   Resp: 18 20 (!) 21   Temp: 98.8 F (37.1 C) 98.2 F (36.8 C) 98.2 F (36.8 C)   TempSrc: Oral Oral Oral   SpO2: 100% 100% 100% 98%  Weight:  132.8 kg    Height:        Intake/Output Summary (Last 24 hours) at 11/24/2019 1501 Last data filed at 11/24/2019 [id number] Gross per 24 hour  Intake 750.26 ml  Output 400 ml  Net 350.26 ml    03/20 1901 - 03/22 0700 In: 1470.3 [P.O.:960; I.V.:510.3] Out: 876 [Urine:875]  Filed Weights   11/22/19 0512 11/23/19 0600 11/24/19 0430  Weight: 133.2 kg 132.9 kg 132.8 kg    Physical Examination:   General-appears in no acute distress Heart-S1-S2, regular, no murmur auscultated Lungs-clear to auscultation bilaterally, no wheezing or crackles auscultated Abdomen-soft, nontender, no organomegaly Extremities-no edema in the lower extremities Neuro-alert, oriented x3, no focal deficit noted   Data Reviewed:   Recent Results (from the past 240 hour(s))  Respiratory Panel by RT PCR (Flu A&B, Covid) - Nasopharyngeal Swab     Status: None   Collection Time: 11/19/19 11:45 PM   Specimen: Nasopharyngeal Swab  Result Value Ref  Range Status   SARS Coronavirus 2 by RT PCR NEGATIVE NEGATIVE Final    Comment: (NOTE) SARS-CoV-2 target nucleic acids are NOT DETECTED. The SARS-CoV-2 RNA is generally detectable in upper respiratoy specimens during the acute phase of infection. The lowest concentration of SARS-CoV-2 viral copies this assay can detect is 131 copies/mL. A negative result does not preclude SARS-Cov-2 infection and should not be used as the sole basis for treatment or other patient management decisions. A negative result may occur with  improper specimen collection/handling, submission of specimen other than nasopharyngeal swab, presence of viral mutation(s) within the areas targeted by this assay, and  inadequate number of viral copies (<131 copies/mL). A negative result must be combined with clinical observations, patient history, and epidemiological information. The expected result is Negative. Fact Sheet for Patients:  PinkCheek.be Fact Sheet for Healthcare Providers:  GravelBags.it This test is not yet ap proved or cleared by the [place] FDA and  has been authorized for detection and/or diagnosis of SARS-CoV-2 by FDA under an Emergency Use Authorization (EUA). This EUA will remain  in effect (meaning this test can be used) for the duration of the COVID-19 declaration under Section 564(b)(1) of the Act, 21 U.S.C. section 360bbb-3(b)(1), unless the authorization is terminated or revoked sooner.    Influenza A by PCR NEGATIVE NEGATIVE Final   Influenza B by PCR NEGATIVE NEGATIVE Final    Comment: (NOTE) The Xpert Xpress SARS-CoV-2/FLU/RSV assay is intended as an aid in  the diagnosis of influenza from Nasopharyngeal swab specimens and  should not be used as a sole basis for treatment. Nasal washings and  aspirates are unacceptable for Xpert Xpress SARS-CoV-2/FLU/RSV  testing. Fact Sheet for  Patients: PinkCheek.be Fact Sheet for Healthcare Providers: GravelBags.it This test is not yet approved or cleared by the [place] FDA and  has been authorized for detection and/or diagnosis of SARS-CoV-2 by  FDA under an Emergency Use Authorization (EUA). This EUA will remain  in effect (meaning this test can be used) for the duration of the  Covid-19 declaration under Section 564(b)(1) of the Act, 21  U.S.C. section 360bbb-3(b)(1), unless the authorization is  terminated or revoked. Performed at [facility], [place] [address], [place], [place] [address]   Surgical PCR screen     Status: None   Collection Time: 11/21/19  2:39 AM   Specimen: Nasal Mucosa; Nasal Swab  Result Value Ref Range Status   MRSA, PCR NEGATIVE NEGATIVE Final   Staphylococcus aureus NEGATIVE NEGATIVE Final    Comment: (NOTE) The Xpert SA Assay (FDA approved for NASAL specimens in patients 70 years of age and older), is one component of a comprehensive surveillance program. It is not intended to diagnose infection nor to guide or monitor treatment. Performed at [facility], [place] [address] [place], [place] [address]      Studies:  No results found.   Admission status: The appropriate admission status for this patient is INPATIENT. Inpatient status is judged to be reasonable and necessary in order to provide the required intensity of service to ensure the patient's safety. The patient's presenting symptoms, physical exam findings, and initial radiographic and laboratory data in the context of their chronic comorbidities is felt to place them at high risk for further clinical deterioration. Furthermore, it is not anticipated that the patient will be medically stable for discharge from the hospital within 2 midnights of admission. The following factors support the admission status of inpatient.    The patient's presenting symptoms include  chest pain The worrisome physical exam findings include  The initial radiographic and laboratory data are worrisome because of elevated troponin, left circumflex stenosis seen on cardiac cath The chronic co-morbidities include obesity, hypertension, CAD    * I certify that at the point of admission it is my clinical judgment that the patient will require inpatient hospital care spanning beyond 2 midnights from the point of admission due to high intensity of service, high risk for further deterioration and high frequency of surveillance required.Oswald Hillock   [facility] If 7PM-7AM, please contact night-coverage at [email or web address], Office  [phone]   11/24/2019, 3:01 PM  LOS: 4 days

## 2019-11-24 NOTE — Progress Notes (Addendum)
Progress Note  Patient Name: Vernon Briggs Date of Encounter: 11/24/2019  Primary Cardiologist: Fransico Him, MD   Subjective   No chest pain or SOB.   Inpatient Medications    Scheduled Meds: . aspirin  81 mg Oral Daily  . carvedilol  3.125 mg Oral BID WC  . clopidogrel  75 mg Oral Daily  . donepezil  5 mg Oral QHS  . FLUoxetine  40 mg Oral QHS  . icosapent Ethyl  2 g Oral BID  . insulin aspart  0-20 Units Subcutaneous TID WC  . insulin aspart  0-5 Units Subcutaneous QHS  . insulin glargine  30 Units Subcutaneous QHS  . magnesium oxide  400 mg Oral BID  . mupirocin ointment  1 application Nasal BID  . rosuvastatin  20 mg Oral QHS  . sodium chloride flush  3 mL Intravenous Q12H  . sodium chloride flush  3 mL Intravenous Q12H  . sodium chloride flush  3 mL Intravenous Q12H   Continuous Infusions: . sodium chloride    . sodium chloride    . sodium chloride 100 mL/hr at 11/24/19 [id number]  . heparin 1,800 Units/hr (11/23/19 0156)   PRN Meds: sodium chloride, sodium chloride, acetaminophen **OR** acetaminophen, hydrALAZINE, nitroGLYCERIN, ondansetron **OR** ondansetron (ZOFRAN) IV, sodium chloride flush, sodium chloride flush   Vital Signs    Vitals:   11/23/19 1144 11/23/19 1719 11/23/19 2039 11/24/19 0430  BP: (!) 149/75 139/70 (!) 160/70 140/72  Pulse: 70 65 68 64  Resp: 20 20 18 20   Temp: 98.4 F (36.9 C) 98.2 F (36.8 C) 98.8 F (37.1 C) 98.2 F (36.8 C)  TempSrc: Oral Oral Oral Oral  SpO2: 97% 100% 100% 100%  Weight:    132.8 kg  Height:        Intake/Output Summary (Last 24 hours) at 11/24/2019 0851 Last data filed at 11/24/2019 [id number] Gross per 24 hour  Intake 990.26 ml  Output 401 ml  Net 589.26 ml   Last 3 Weights 11/24/2019 11/23/2019 11/22/2019  Weight (lbs) 292 lb 12.8 oz 293 lb 293 lb 9.6 oz  Weight (kg) 132.813 kg 132.904 kg 133.176 kg      Telemetry    V pacing - Personally Reviewed  ECG    No new - Personally Reviewed  Physical Exam     GEN: No acute distress.  Resting with CPAP Neck: No JVD Cardiac: RRR, no murmurs, rubs, or gallops.  Respiratory: Clear to auscultation bilaterally. GI: Soft, nontender, non-distended  MS: 2+ edema of feet; No deformity. Neuro:  Nonfocal  Psych: Normal affect   Labs    High Sensitivity Troponin:   Recent Labs  Lab 11/19/19 2207 11/20/19 0007 11/20/19 0336 11/20/19 0611  TROPONINIHS 55* 95* 314* 485*      Chemistry Recent Labs  Lab 11/20/19 0336 11/21/19 0329 11/22/19 0343 11/23/19 0403 11/24/19 0517  NA 141   < > 136 137 137  K 4.4   < > 4.6 4.7 4.5  CL 104   < > 101 100 102  CO2 28   < > 23 25 25   GLUCOSE 211*   < > 265* 262* 292*  BUN 45*   < > 47* 54* 65*  CREATININE 1.83*   < > 1.77* 1.85* 1.98*  CALCIUM 8.8*   < > 8.6* 8.8* 8.6*  PROT 5.8*  --   --   --   --   ALBUMIN 3.5  --   --   --   --  AST 27  --   --   --   --   ALT 21  --   --   --   --   ALKPHOS 77  --   --   --   --   BILITOT 0.9  --   --   --   --   GFRNONAA 35*   < > 37* 35* 32*  GFRAA 41*   < > 43* 40* 37*  ANIONGAP 9   < > 12 12 10    < > = values in this interval not displayed.     Hematology Recent Labs  Lab 11/22/19 0343 11/23/19 0403 11/24/19 0517  WBC 8.4 8.3 6.2  RBC 3.66* 3.56* 3.40*  HGB 12.4* 11.7* 11.3*  HCT 36.8* 35.6* 34.1*  MCV 100.5* 100.0 100.3*  MCH 33.9 32.9 33.2  MCHC 33.7 32.9 33.1  RDW 14.2 14.0 14.1  PLT 62* 67* 69*    BNPNo results for input(s): BNP, PROBNP in the last 168 hours.   DDimer  Recent Labs  Lab 11/20/19 [phone]  DDIMER 0.69*     Radiology    No results found.  Cardiac Studies   CARDIAC CATHETERIZATION  Result Date: 11/21/2019  Prox RCA to Mid RCA lesion is 100% stenosed.  Prox Cx to Dist Cx lesion is 10% stenosed.  Mid LM lesion is 90% stenosed.  Ost LAD to Prox LAD lesion is 100% stenosed.  LIMA graft was visualized by angiography and is normal in caliber.  SVG graft was not visualized.  Origin to Prox Graft lesion  is 100% stenosed.  SVG graft was not visualized.  Origin to Prox Graft lesion is 100% stenosed.  SVG graft was not visualized.  Origin to Prox Graft lesion is 100% stenosed.   1. Severe left main stenosis. The left main supplies the Circumflex only as the LAD is occluded.  2. Chronic occlusion of the proximal LAD. The mid and distal LAD fills from the patent LIMA graft 3. Chronic occlusion of intermediate branch. Chronic occlusion of the SVG to intermediate branch 4. Chronic occlusion SVG to OM 5. Chronic occlusion mid RCA. The RCA fills from left to right collaterals supplied by the LAD through the LIMA. The SVG to the RCA is chronically occluded.   Recommendations: He has had progression of his left main disease. This is felt to be his culprit vessel. His left main supplies the Circumflex artery. The LAD is chronically occluded and fills from the patent LIMA graft. The RCA fills from left to right collaterals. I think he will need PCI of the left main lesion which is essentially a proximal Circumflex stenosis since the LAD is occluded and protected by the graft. Given his CKD, this was a diagnostic only cath.  Will hydrate over the weekend. Given elevated LVEDP, will gently diurese.  Continue ASA and Plavix. Resume IV heparin 6 hours post sheath pull.  Will plan PCI/stenting of the left main artery on Monday. Will review films with the IC team. I do not think atherectomy is indicated prior to stenting.   Diagnostic Dominance: Right     Echo 11/20/19 IMPRESSIONS    1. Left ventricular ejection fraction, by estimation, is 25 to 30%. The  left ventricle has severely decreased function. The left ventricle  demonstrates regional wall motion abnormalities (see scoring  diagram/findings for description). Left ventricular  diastolic function could not be evaluated.  2. Right ventricular systolic function is mildly reduced. The right  ventricular size is  mildly enlarged. There is  mildly elevated pulmonary  artery systolic pressure. The estimated right ventricular systolic  pressure is 123XX123 mmHg.  3. The mitral valve is grossly normal. Mild mitral valve regurgitation.  No evidence of mitral stenosis.  4. Tricuspid valve regurgitation is mild to moderate.  5. The aortic valve is tricuspid. Aortic valve regurgitation is not  visualized. No aortic stenosis is present.  6. Difficult study with poor windows despite contrast. LVEF severely  reduced 25-30%. Mid septum into apex is akinetic consistent with prior  infarction. No LV thrombus on contrast imaging.   FINDINGS  Left Ventricle: Left ventricular ejection fraction, by estimation, is 25  to 30%. The left ventricle has severely decreased function. The left  ventricle demonstrates regional wall motion abnormalities. Definity  contrast agent was given IV to delineate  the left ventricular endocardial borders. The left ventricular internal  cavity size was normal in size. There is no left ventricular hypertrophy.  Left ventricular diastolic function could not be evaluated due to paced  rhythm. Left ventricular diastolic  function could not be evaluated.     LV Wall Scoring:  The mid and distal anterior septum, apical anterior segment, and apex are  akinetic.   Right Ventricle: The right ventricular size is mildly enlarged. No  increase in right ventricular wall thickness. Right ventricular systolic  function is mildly reduced. There is mildly elevated pulmonary artery  systolic pressure. The tricuspid regurgitant  velocity is 3.00 m/s, and with an assumed right atrial pressure of 8  mmHg, the estimated right ventricular systolic pressure is 123XX123 mmHg.   Left Atrium: Left atrial size was normal in size.   Right Atrium: Right atrial size was normal in size.   Pericardium: There is no evidence of pericardial effusion. Presence of  pericardial fat pad.   Mitral Valve: The mitral valve is grossly normal.  Mild mitral valve  regurgitation. No evidence of mitral valve stenosis.   Tricuspid Valve: The tricuspid valve is grossly normal. Tricuspid valve  regurgitation is mild to moderate.   Aortic Valve: The aortic valve is tricuspid. Aortic valve regurgitation is  not visualized. No aortic stenosis is present.   Pulmonic Valve: The pulmonic valve was grossly normal. Pulmonic valve  regurgitation is not visualized. No evidence of pulmonic stenosis.   Aorta: The aortic root is normal in size and structure.   IAS/Shunts: No atrial level shunt detected by color flow Doppler.   Additional Comments: A pacer wire is visualized in the right atrium and  right ventricle.  Patient Profile     74 y.o. male with a hx of CAD status post CABG x4 12/21/1997 [facility] with LIMA to LAD, SVG to dRCA, SVG to OM1, SVG to RI) with PCI x6 in approximately 2011 ([place]) with most recent PCI/DES to LCx in 2016 at [facility], CKD stage III, DM 2, hypertension, hyperlipidemia, obesity, PAF on Eliquis, prior left frontal CVA and ICM s/p ICD in 2013 now admitted 11/19/19 with chest pain.  Hs troponin elevated   Assessment & Plan    1.  Chest pain/NSTEMI/CAD -concerning for ACS--now on IV heparin, eliquis on hold for cardiac PCI to LM today at 1330.  Cr today is 1.98 up from 1.85  -hstrop mildly elevated  -last PCI was in 2016 and CP similar to then -he has significant hx of CAD with last cath in 2016 with SVGs occluded and severe native CAD and patent LIMA>LAD s/p PCI of the LCx -ACE I and diuretics  on hold   2.  CAD -s/p remote CABG 1999 -s/p PCI in LCx 2016 -cath in 2016 with patent LIMA>LAD, occluded SVGs and severe native CAD - ASA 81mg  daily -continue Crestor 10mg  daily and Plavix 75mg  daily -continue carvedilol 3.125mg  BID -LHC with results as above and plan for PCI to LM today   3.  PAF -maintaining NSR with V pacing difficult to see P wave  -Eliquis on hold for cath  -last dose of Eliquis was pm 3/17 -IV  Heparin gtt given hx of CVA in past while off eliquis.  4.  HLD -LDL goal < 70 -LDL 22 ? Correct with elevated TG, TG 379, HDL 22, TChol 120 -continue Crestor 20mg  daily up form 10 mg, Vascepa at 2 g BID  5.  DM2 -per TRH - HbA1C 8 -continue SS Insulin  6.  HTN -BP labile held ACE and diuretics for cardiac cath.   -on carvedilol 3.125mg  BID, Hydralazine 50mg  TID, HCTZ 25mg  daily, Lisinopril 40mg  daily at home (160/70 to 140/72)   7.  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  CKD-3b  -Cr 1.98 today   9.  Thrombocytopenia  -Chronic plts today 69 was 97 on admit but lower since that time.   On ASA, plavix and heparin.  May not be able to discharge on ASA, plavix and eliquis. Continue to monitor.        For questions or updates, please contact [place] Please consult [email or web address] for contact info under        Signed, Cecilie Kicks, NP  11/24/2019, 8:51 AM    History and all data above reviewed.  Patient examined.  I agree with the findings as above.  He feels OK.  Says that his breathing is better than before.  The patient denies any new symptoms such as chest discomfort, neck or arm discomfort. There has been no new shortness of breath, PND or orthopnea. There have been no reported palpitations, presyncope or syncope.  The patient exam reveals COR:RRR  ,  Lungs: Clear  ,  Abd: Positive bowel sounds, no rebound no guarding , Ext Mild edema  .  All available labs, radiology testing, previous records reviewed. Agree with documented assessment and plan.  NSTEMI:  Needs PCI of LM into circ.  Discussed at length the risk of AKI with further contrast.  He understands and really wants to proceed.  We cannot hydrate and I am not convinced that we  can lower his risk with further delay.  Risk will be attenuated by limiting contrast.  Patient understands however the potential for progressive renal failure and even need for dialysis.   Jeneen Rinks [facility]  10:42 AM  11/24/2019

## 2019-11-24 NOTE — Interval H&P Note (Signed)
History and Physical Interval Note:  11/24/2019 1:37 PM  Vernon Briggs  has presented today for surgery, with the NSTEMI.  The various methods of treatment have been discussed with the patient and family. After consideration of risks, benefits and other options for treatment, the patient has consented to  Procedure(s): CORONARY STENT INTERVENTION (N/A) as a surgical intervention.  The patient's history has been reviewed, patient examined, no change in status, stable for surgery.  I have reviewed the patient's chart and labs.  Questions were answered to the patient's satisfaction.  The patient has agreed to rescind his DNR/DNI status during the procedure and will be full code until leaving the cardiac cath lab.  Cath Lab Visit (complete for each Cath Lab visit)  Clinical Evaluation Leading to the Procedure:   ACS: Yes.    Non-ACS:  N/A  [name]

## 2019-11-24 NOTE — Progress Notes (Addendum)
ANTICOAGULATION CONSULT NOTE  Pharmacy Consult for heparin Indication: chest pain/ACS, atrial fibrillation  Patient Measurements: Height: 6' (182.9 cm) Weight: 292 lb 12.8 oz (132.8 kg) IBW/kg (Calculated) : 77.6 Heparin Dosing Weight: 106kg  Vital Signs: Temp: 98.2 F (36.8 C) (03/22 0430) Temp Source: Oral (03/22 0430) BP: 140/72 (03/22 0430) Pulse Rate: 64 (03/22 0430)  Labs: Recent Labs    11/22/19 0343 11/22/19 0343 11/22/19 1340 11/22/19 2102 11/23/19 0403 11/24/19 0517  HGB 12.4*   < >  --   --  11.7* 11.3*  HCT 36.8*  --   --   --  35.6* 34.1*  PLT 62*  --   --   --  67* 69*  APTT 62*   < > 58* 74* 71*  --   HEPARINUNFRC 0.64  --   --   --  0.41 0.27*  CREATININE 1.77*  --   --   --  1.85* 1.98*   < > = values in this interval not displayed.    Estimated Creatinine Clearance: 45.5 mL/min (A) (by C-G formula based on SCr of 1.98 mg/dL (H)).   Assessment: 19 YOM presenting with chest pain with hx CAD s/p CABG, hx of PAF (CHADS2VASc = 6) previously on warfarin now on Eliquis (last dose 3/17), chronic thrombocytopenia plts 60s.     Heparin restarted post cath 3/19. HL still downtrending, may have still been somewhat affected by apixaban yesterday. No issues with infusion per RN. H/H, plt low stable. Planning left main intervention soon.   Goal of Therapy:  Heparin level 0.3= 0.7 units/ml Monitor platelets by anticoagulation protocol: Yes   Plan:  Increase heparin gtt to 1950 units/hr F/u 6hr HL Monitor daily HL, CBC/plt Monitor for signs/symptoms of bleeding  F/u switch back to apixaban after intervention  Benetta Spar, PharmD, BCPS, [place] Pharmacist  Please check AMION for all [place] phone numbers After 10:00 PM, call [place]

## 2019-11-24 NOTE — Progress Notes (Signed)
ANTICOAGULATION CONSULT NOTE  Pharmacy Consult for heparin Indication: chest pain/ACS, atrial fibrillation  Patient Measurements: Height: 6' (182.9 cm) Weight: 292 lb 12.8 oz (132.8 kg) IBW/kg (Calculated) : 77.6 Heparin Dosing Weight: 106kg  Vital Signs: Temp: 98.2 F (36.8 C) (03/22 1127) Temp Source: Oral (03/22 1127) BP: 161/71 (03/22 1555) Pulse Rate: 60 (03/22 1555)  Labs: Recent Labs    11/22/19 0343 11/22/19 0343 11/22/19 1340 11/22/19 2102 11/23/19 0403 11/24/19 0517  HGB 12.4*   < >  --   --  11.7* 11.3*  HCT 36.8*  --   --   --  35.6* 34.1*  PLT 62*  --   --   --  67* 69*  APTT 62*   < > 58* 74* 71*  --   HEPARINUNFRC 0.64  --   --   --  0.41 0.27*  CREATININE 1.77*  --   --   --  1.85* 1.98*   < > = values in this interval not displayed.    Estimated Creatinine Clearance: 45.5 mL/min (A) (by C-G formula based on SCr of 1.98 mg/dL (H)).   Assessment: 21 YOM presenting with chest pain with hx CAD s/p CABG, hx of PAF (CHADS2VASc = 6) previously on warfarin now on Eliquis (last dose 3/17), chronic thrombocytopenia plts 60s.     Underwent cardiac cath with successful PCI to LMCA/LCx with DES on 3/22. Plan to restart heparin 2 hours after TR band removal (documented on 3/22@2100 ). Hgb 11.3, plt stable but low at 69.   Goal of Therapy:  Heparin level 0.3= 0.7 units/ml Monitor platelets by anticoagulation protocol: Yes   Plan:  Restart heparin gtt at 1950 units/hr on 3/22@2300  F/u 8 hr HL Monitor daily HL, CBC/plt Monitor for signs/symptoms of bleeding  F/u switch back to apixaban tomorrow   Antonietta Jewel, PharmD, BCCCP Clinical Pharmacist  Phone: [phone]  Please check AMION for all [place] phone numbers After 10:00 PM, call [place] [phone]

## 2019-11-25 LAB — CBC
HCT: 35.6 % — ABNORMAL LOW (ref 39.0–52.0)
Hemoglobin: 11.8 g/dL — ABNORMAL LOW (ref 13.0–17.0)
MCH: 33 pg (ref 26.0–34.0)
MCHC: 33.1 g/dL (ref 30.0–36.0)
MCV: 99.4 fL (ref 80.0–100.0)
Platelets: 89 10*3/uL — ABNORMAL LOW (ref 150–400)
RBC: 3.58 MIL/uL — ABNORMAL LOW (ref 4.22–5.81)
RDW: 13.9 % (ref 11.5–15.5)
WBC: 6.6 10*3/uL (ref 4.0–10.5)
nRBC: 0 % (ref 0.0–0.2)

## 2019-11-25 LAB — BASIC METABOLIC PANEL
Anion gap: 11 (ref 5–15)
BUN: 58 mg/dL — ABNORMAL HIGH (ref 8–23)
CO2: 22 mmol/L (ref 22–32)
Calcium: 8.6 mg/dL — ABNORMAL LOW (ref 8.9–10.3)
Chloride: 105 mmol/L (ref 98–111)
Creatinine, Ser: 1.71 mg/dL — ABNORMAL HIGH (ref 0.61–1.24)
GFR calc Af Amer: 44 mL/min — ABNORMAL LOW (ref >60)
GFR calc non Af Amer: 38 mL/min — ABNORMAL LOW (ref >60)
Glucose, Bld: 238 mg/dL — ABNORMAL HIGH (ref 70–99)
Potassium: 4.5 mmol/L (ref 3.5–5.1)
Sodium: 138 mmol/L (ref 135–145)

## 2019-11-25 LAB — GLUCOSE, CAPILLARY
Glucose-Capillary: 226 mg/dL — ABNORMAL HIGH (ref 70–99)
Glucose-Capillary: 285 mg/dL — ABNORMAL HIGH (ref 70–99)
Glucose-Capillary: 231 mg/dL — ABNORMAL HIGH (ref 70–99)
Glucose-Capillary: 176 mg/dL — ABNORMAL HIGH (ref 70–99)

## 2019-11-25 LAB — HEPARIN LEVEL (UNFRACTIONATED): Heparin Unfractionated: 0.32 IU/mL (ref 0.30–0.70)

## 2019-11-25 MED ORDER — TORSEMIDE 20 MG PO TABS
40.0000 mg | ORAL_TABLET | Freq: Every day | ORAL | Status: DC
  Administered 2019-11-25 – 2019-11-26 (×2): 40 mg via ORAL
  Filled 2019-11-25 (×2): qty 2

## 2019-11-25 MED ORDER — APIXABAN 5 MG PO TABS
5.0000 mg | ORAL_TABLET | Freq: Two times a day (BID) | ORAL | Status: DC
  Administered 2019-11-25 – 2019-11-26 (×3): 5 mg via ORAL
  Filled 2019-11-25 (×3): qty 1

## 2019-11-25 MED ORDER — INSULIN GLARGINE 100 UNIT/ML ~~LOC~~ SOLN
36.0000 [IU] | Freq: Every day | SUBCUTANEOUS | Status: DC
  Administered 2019-11-25: 36 [IU] via SUBCUTANEOUS
  Filled 2019-11-25 (×2): qty 0.36

## 2019-11-25 MED ORDER — INSULIN ASPART 100 UNIT/ML ~~LOC~~ SOLN
8.0000 [IU] | Freq: Three times a day (TID) | SUBCUTANEOUS | Status: DC
  Administered 2019-11-25 – 2019-11-26 (×3): 8 [IU] via SUBCUTANEOUS

## 2019-11-25 NOTE — Progress Notes (Signed)
PT Cancellation Note  Patient Details Name: Vernon Briggs MRN: [id number] DOB: 18-Feb-1946   Cancelled Treatment:    Reason Eval/Treat Not Completed: Fatigue/lethargy limiting ability to participate.  Per pt and wife, pt was just up in room to walk and now is too tired for his Eval from PT.  Reattempt as time and pt allow.   Ramond Dial 11/25/2019, 2:30 PM   Mee Hives, PT MS Acute Rehab Dept. Number: [place] and [place]

## 2019-11-25 NOTE — Plan of Care (Signed)

## 2019-11-25 NOTE — Progress Notes (Signed)
Pt had a DNR bracelet on wrist upon assessment and stated he wished to be DNR;however, Pt orders in chart stated Full Code. RN spoke with pt and his wife at bedside concerning their wishes. Both agreed they wanted the patent to be DNR and understand risk/benifits.

## 2019-11-25 NOTE — Progress Notes (Signed)
Progress Note  Patient Name: Vernon Briggs Date of Encounter: 11/25/2019  Primary Cardiologist:   Fransico Him, MD   Subjective   He was able to walk in the hall without O2 and had 98% sat.  Denies pain.    Inpatient Medications    Scheduled Meds: . aspirin  81 mg Oral Daily  . carvedilol  3.125 mg Oral BID WC  . clopidogrel  75 mg Oral Daily  . donepezil  5 mg Oral QHS  . FLUoxetine  40 mg Oral QHS  . icosapent Ethyl  2 g Oral BID  . insulin aspart  0-20 Units Subcutaneous TID WC  . insulin aspart  0-5 Units Subcutaneous QHS  . insulin glargine  30 Units Subcutaneous QHS  . magnesium oxide  400 mg Oral BID  . rosuvastatin  20 mg Oral QHS  . sodium chloride flush  3 mL Intravenous Q12H  . sodium chloride flush  3 mL Intravenous Q12H   Continuous Infusions: . sodium chloride    . heparin 1,950 Units/hr (11/24/19 2314)   PRN Meds: sodium chloride, acetaminophen **OR** acetaminophen, nitroGLYCERIN, ondansetron **OR** ondansetron (ZOFRAN) IV, sodium chloride flush   Vital Signs    Vitals:   11/24/19 2124 11/24/19 2155 11/25/19 0301 11/25/19 0626  BP:  (!) 154/73 (!) 156/74 (!) 148/76  Pulse:  64 66 62  Resp:  (!) 23 20 20   Temp:  98 F (36.7 C) 97.8 F (36.6 C) 98.1 F (36.7 C)  TempSrc:  Oral Oral Oral  SpO2: 96% 100%    Weight:    131.7 kg  Height:        Intake/Output Summary (Last 24 hours) at 11/25/2019 0802 Last data filed at 11/25/2019 [id number] Gross per 24 hour  Intake 289.7 ml  Output 1775 ml  Net -1485.3 ml   Filed Weights   11/23/19 0600 11/24/19 0430 11/25/19 0626  Weight: 132.9 kg 132.8 kg 131.7 kg    Telemetry    NSR - Personally Reviewed  ECG    NA - Personally Reviewed  Physical Exam   GEN: No acute distress.   Neck: No  JVD Cardiac: RRR, no murmurs, rubs, or gallops.  Respiratory: Clear  to auscultation bilaterally. GI: Soft, nontender, non-distended  MS: No  edema; No deformity. Neuro:  Nonfocal  Psych: Normal affect    Labs    Chemistry Recent Labs  Lab 11/20/19 0336 11/21/19 0329 11/22/19 0343 11/23/19 0403 11/24/19 0517  NA 141   < > 136 137 137  K 4.4   < > 4.6 4.7 4.5  CL 104   < > 101 100 102  CO2 28   < > 23 25 25   GLUCOSE 211*   < > 265* 262* 292*  BUN 45*   < > 47* 54* 65*  CREATININE 1.83*   < > 1.77* 1.85* 1.98*  CALCIUM 8.8*   < > 8.6* 8.8* 8.6*  PROT 5.8*  --   --   --   --   ALBUMIN 3.5  --   --   --   --   AST 27  --   --   --   --   ALT 21  --   --   --   --   ALKPHOS 77  --   --   --   --   BILITOT 0.9  --   --   --   --   GFRNONAA 35*   < > 37*  35* 32*  GFRAA 41*   < > 43* 40* 37*  ANIONGAP 9   < > 12 12 10    < > = values in this interval not displayed.     Hematology Recent Labs  Lab 11/22/19 0343 11/23/19 0403 11/24/19 0517  WBC 8.4 8.3 6.2  RBC 3.66* 3.56* 3.40*  HGB 12.4* 11.7* 11.3*  HCT 36.8* 35.6* 34.1*  MCV 100.5* 100.0 100.3*  MCH 33.9 32.9 33.2  MCHC 33.7 32.9 33.1  RDW 14.2 14.0 14.1  PLT 62* 67* 69*    Cardiac EnzymesNo results for input(s): TROPONINI in the last 168 hours. No results for input(s): TROPIPOC in the last 168 hours.   BNPNo results for input(s): BNP, PROBNP in the last 168 hours.   DDimer  Recent Labs  Lab 11/20/19 [id number]  DDIMER 0.69*     Radiology    CARDIAC CATHETERIZATION  Result Date: 11/24/2019 Conclusions: 1. Severe left coronary artery disease, including 90% mid/distal LMCA stenosis and chronic total occlusion of proximal LAD (supplied by patent LIMA-LAD graft). 2. Patent stents in the proximal through distal LCx. 3. Moderately elevated left-ventricular filling pressure. 4. Successful IVUS-guided PCI to the LMCA and proximal LCx using a Synergy 3.5 x 24 mm drug-eluting stent with 0% residual stenosis and TIMI-3 flow. Recommendations: 1. Indefinite dual antiplatelet therapy with aspirin and clopidogrel.  If apixaban is to be restarted, I would favor discontinuation of aspirin and use of apixaban and clopidogrel for at  least 12 months. 2. Restart heparin infusion 2 hours after TR band removal, given history of paroxysmal atrial fibrillation with prior stroke. 3. Aggressive secondary prevention. 4. Restart diuresis; will given furosemide 80 mg IV x 1 now, with further dosing to be determined by output and renal function.  Optimize evidence-based heart failure therapy, as tolerated. [name], MD [facility]    Cardiac Studies   Cath 11/25/19  Diagnostic Dominance: Right  Intervention     Patient Profile     74 y.o. male hx of CAD status post CABGx44/19/1999 [facility], SVG todRCA, SVG to OM1, SVG toRI)with PCIx6in approximately 2011([facility])with most recentPCI/DES to [phone] at [facility], CKD stage III,DM 2, hypertension, hyperlipidemia, obesity, PAFon Eliquis, prior left frontal CVA and ICMs/pICD in 2013now admitted 11/19/19 with chest pain.  Hs troponin elevated.  He is now status post LM stent.    Assessment & Plan    PAF:   He has had a previous CVA.  He is at high risk for future thromboembolic stroke and so DOAC (Eliquis should be restarted.)  He is however at increased risk of bleeding with thrombocytopenia.  Therefore, we will avoid triple therapy.  He should be discharged on Plavix and Eliquis and not ASA.      NSTEMI:  Stented yesterday.  As above  CKD III:  Creat is improved.  He will need TOC follow up in our office or the [place] for follow up creat in a few days.  Hold off on restarting ACE inhibitor at discharge.   DYSLIPIDEMIA:    Continue Crestor  HTN:  BP is rising off of lisinopril.  I am going to increase the Imdur and increase back to his previous dose of Coreg.    ISCHEMIC CARDIOMYOPATHY:   EDP elevated yesterday at cath.  Given IV diuresis.   I will restart the previous dose of Torsemide.    I am holding off on restarting spironolactone.   THROMBOCYTOPENIA:    Stable.  Follow as an  outpatient.     For questions or updates, please contact [place] Please consult [email or web address] for contact info under Cardiology/STEMI.   Signed, Minus Breeding, MD  11/25/2019, 8:02 AM

## 2019-11-25 NOTE — Progress Notes (Signed)
CARDIAC REHAB PHASE I   PRE:  Rate/Rhythm: 65 SR    BP: sitting 152/68    SaO2: 96 RA  MODE:  Ambulation: 30 ft   POST:  Rate/Rhythm: 72 SR    BP: sitting 155/75     SaO2: 98 RA  Pt poorly motivated. Mod assist to get to EOB. Able to stand independently although needed verbal cues for next steps to get to [facility] when he was having diarrhea. Some incontinence. Pt able to walk with RW 30 ft in hall. Denied CP. Slow pace, sat in hall when tired and rolled back to room. Left in recliner, pt immediately asked for cpap again although SaO2 98 RA and denied CP or SOB. VSS.   Discussed stent, Plavix, restrictions, diet, HF management, NTG, and CRPII. Pt is very limited at baseline, sts he walks room to room at home. His wife is concerned and is interested in having some help at home. He would benefit from HHPT to help with mobility. Also suggest PT in hospital if he stays. Will refer to CPRII but pt is unable to do program at this time.  [id number]  [place], ACSM 11/25/2019 11:08 AM

## 2019-11-25 NOTE — Progress Notes (Signed)
[facility]  PROGRESS NOTE  Vernon Briggs [id number] DOB: [birth year] DOA: 11/19/2019 PCP: [name], MD   Patient admitted this morning by Dr. Hal Hope.  See detailed H&P.   Brief HPI:   74 year old male with a history of CAD s/p CABG and stenting in 2016, CKD stage III, diabetes mellitus type 2, hypertension, paroxysmal atrial fibrillation, ischemic cardiomyopathy status post AICD presented to the ED with complaints of chest pain.  Patient stated that he was having chest pain and shortness of breath for past 1 month.  In the ED EKG showed normal sinus rhythm with IVCD.  Chest x-ray was unremarkable.  COVID-19 test was negative.  Creatinine was 1.8 which increased from baseline around 1.6.  Patient admitted with chest pain with concern for ACS.  Cardiology was consulted.    Subjective   Patient seen and examined, s/p left main stent yesterday.   Assessment/Plan:     1. Chest pain-patient has history of CAD s/p CABG, cardiology was consulted.  Patient underwent diagnostic cardiac cath.  Found to have stenosis of left circumflex.  No stenting was performed due to the renal insufficiency.  Patient's renal function is closely monitored, he will get IV Lasix as per cardiology.  S/p LM stent yesterday.  Continue Plavix and apixaban.  Cardiology has signed off.  2. Diabetes mellitus type 2-blood glucose is mildly elevated, patient takes concentrated Humulin R 55 to 110 units at home.  He has been started on resistant sliding scale.  Blood glucose is still elevated.  Will increase Lantus to 36 units subcu nightly and start NovoLog 8 units 3 times daily with meal coverage.  3. Hypertension-continue Coreg, blood pressure is well controlled.  Continue IV as needed hydralazine.  4. CKD stage III-patient baseline creatinine is around 1.3-1.5.  He presented with creatinine of 1. 8.  Today creatinine is 1.71 today.  Improved from 1.98 yesterday  5. Ischemic cardiomyopathy s/p  AICD-continue Plavix, apixaban Crestor.  6. Paroxysmal atrial fibrillation-continue Coreg, apixaban  7. Thrombocytopenia- chronic. Today platelet count is 89,000.  8. Sleep apnea-continue CPAP nightly    SpO2: 97 % O2 Flow Rate (L/min): 4 L/min   COVID-19 Labs  No results for input(s): DDIMER, FERRITIN, LDH, CRP in the last 72 hours.  Lab Results  Component Value Date   [place] NEGATIVE 11/19/2019     CBG: Recent Labs  Lab 11/24/19 1128 11/24/19 1548 11/24/19 2157 11/25/19 0812 11/25/19 1132  GLUCAP 258* 199* 238* 226* 285*    CBC: Recent Labs  Lab 11/20/19 0336 11/20/19 0336 11/21/19 0329 11/22/19 0343 11/23/19 0403 11/24/19 0517 11/25/19 0850  WBC 6.7   < > 8.4 8.4 8.3 6.2 6.6  NEUTROABS 4.6  --   --   --   --   --   --   HGB 12.6*   < > 12.8* 12.4* 11.7* 11.3* 11.8*  HCT 37.8*   < > 38.6* 36.8* 35.6* 34.1* 35.6*  MCV 99.2   < > 100.3* 100.5* 100.0 100.3* 99.4  PLT 76*   < > 69* 62* 67* 69* 89*   < > = values in this interval not displayed.    Basic Metabolic Panel: Recent Labs  Lab 11/21/19 0329 11/22/19 0343 11/23/19 0403 11/24/19 0517 11/25/19 0850  NA 137 136 137 137 138  K 4.7 4.6 4.7 4.5 4.5  CL 101 101 100 102 105  CO2 24 23 25 25 22   GLUCOSE 314* 265* 262* 292* 238*  BUN 46* 47* 54* 65* 58*  CREATININE 1.86* 1.77* 1.85* 1.98* 1.71*  CALCIUM 8.8* 8.6* 8.8* 8.6* 8.6*     Liver Function Tests: Recent Labs  Lab 11/20/19 0336  AST 27  ALT 21  ALKPHOS 77  BILITOT 0.9  PROT 5.8*  ALBUMIN 3.5        DVT prophylaxis: Heparin  Code Status: Full code  Family Communication: No family at bedside  Disposition Plan: Patient admitted with chest pain, concern for ACS.  Underwent cardiac cath.  Barrier to discharge-he will need PT evaluation in a.m to assess whether he would need to go to rehab      Scheduled medications:  . apixaban  5 mg Oral BID  . carvedilol  3.125 mg Oral BID WC  . clopidogrel  75 mg Oral Daily  .  donepezil  5 mg Oral QHS  . FLUoxetine  40 mg Oral QHS  . icosapent Ethyl  2 g Oral BID  . insulin aspart  0-20 Units Subcutaneous TID WC  . insulin aspart  0-5 Units Subcutaneous QHS  . insulin glargine  30 Units Subcutaneous QHS  . magnesium oxide  400 mg Oral BID  . rosuvastatin  20 mg Oral QHS  . sodium chloride flush  3 mL Intravenous Q12H  . sodium chloride flush  3 mL Intravenous Q12H  . torsemide  40 mg Oral Daily    Consultants:  Cardiology  Procedures:  Diagnostic cardiac cath  Antibiotics:   Anti-infectives (From admission, onward)   None       Objective   Vitals:   11/24/19 2155 11/25/19 0301 11/25/19 0626 11/25/19 1000  BP: (!) 154/73 (!) 156/74 (!) 148/76 (!) 151/76  Pulse: 64 66 62 63  Resp: (!) 23 20 20 19   Temp: 98 F (36.7 C) 97.8 F (36.6 C) 98.1 F (36.7 C) 98 F (36.7 C)  TempSrc: Oral Oral Oral Oral  SpO2: 100%   97%  Weight:   131.7 kg   Height:        Intake/Output Summary (Last 24 hours) at 11/25/2019 1533 Last data filed at 11/25/2019 1400 Gross per 24 hour  Intake 409.7 ml  Output 2375 ml  Net -1965.3 ml    03/21 1901 - 03/23 0700 In: 585.3 [P.O.:480; I.V.:105.3] Out: 2175 [Urine:2175]  Filed Weights   11/23/19 0600 11/24/19 0430 11/25/19 0626  Weight: 132.9 kg 132.8 kg 131.7 kg    Physical Examination:   General-appears in no acute distress Heart-S1-S2, regular, no murmur auscultated Lungs-clear to auscultation bilaterally, no wheezing or crackles auscultated Abdomen-soft, nontender, no organomegaly Extremities-no edema in the lower extremities Neuro-alert, oriented x3, no focal deficit noted  Data Reviewed:   Recent Results (from the past 240 hour(s))  Respiratory Panel by RT PCR (Flu A&B, Covid) - Nasopharyngeal Swab     Status: None   Collection Time: 11/19/19 11:45 PM   Specimen: Nasopharyngeal Swab  Result Value Ref Range Status   SARS Coronavirus 2 by RT PCR NEGATIVE NEGATIVE Final    Comment:  (NOTE) SARS-CoV-2 target nucleic acids are NOT DETECTED. The SARS-CoV-2 RNA is generally detectable in upper respiratoy specimens during the acute phase of infection. The lowest concentration of SARS-CoV-2 viral copies this assay can detect is 131 copies/mL. A negative result does not preclude SARS-Cov-2 infection and should not be used as the sole basis for treatment or other patient management decisions. A negative result may occur with  improper specimen collection/handling, submission of specimen other than nasopharyngeal swab, presence of viral mutation(s) within the  areas targeted by this assay, and inadequate number of viral copies (<131 copies/mL). A negative result must be combined with clinical observations, patient history, and epidemiological information. The expected result is Negative. Fact Sheet for Patients:  PinkCheek.be Fact Sheet for Healthcare Providers:  GravelBags.it This test is not yet ap proved or cleared by the [place] FDA and  has been authorized for detection and/or diagnosis of SARS-CoV-2 by FDA under an Emergency Use Authorization (EUA). This EUA will remain  in effect (meaning this test can be used) for the duration of the COVID-19 declaration under Section 564(b)(1) of the Act, 21 U.S.C. section 360bbb-3(b)(1), unless the authorization is terminated or revoked sooner.    Influenza A by PCR NEGATIVE NEGATIVE Final   Influenza B by PCR NEGATIVE NEGATIVE Final    Comment: (NOTE) The Xpert Xpress SARS-CoV-2/FLU/RSV assay is intended as an aid in  the diagnosis of influenza from Nasopharyngeal swab specimens and  should not be used as a sole basis for treatment. Nasal washings and  aspirates are unacceptable for Xpert Xpress SARS-CoV-2/FLU/RSV  testing. Fact Sheet for Patients: PinkCheek.be Fact Sheet for Healthcare  Providers: GravelBags.it This test is not yet approved or cleared by the [place] FDA and  has been authorized for detection and/or diagnosis of SARS-CoV-2 by  FDA under an Emergency Use Authorization (EUA). This EUA will remain  in effect (meaning this test can be used) for the duration of the  Covid-19 declaration under Section 564(b)(1) of the Act, 21  U.S.C. section 360bbb-3(b)(1), unless the authorization is  terminated or revoked. Performed at [facility], [place] [address] [place], [place] [address]   Surgical PCR screen     Status: None   Collection Time: 11/21/19  2:39 AM   Specimen: Nasal Mucosa; Nasal Swab  Result Value Ref Range Status   MRSA, PCR NEGATIVE NEGATIVE Final   Staphylococcus aureus NEGATIVE NEGATIVE Final    Comment: (NOTE) The Xpert SA Assay (FDA approved for NASAL specimens in patients 45 years of age and older), is one component of a comprehensive surveillance program. It is not intended to diagnose infection nor to guide or monitor treatment. Performed at [facility], [place] [address], [place], [place] [address]      Studies:  CARDIAC CATHETERIZATION  Result Date: 11/24/2019 Conclusions: 1. Severe left coronary artery disease, including 90% mid/distal LMCA stenosis and chronic total occlusion of proximal LAD (supplied by patent LIMA-LAD graft). 2. Patent stents in the proximal through distal LCx. 3. Moderately elevated left-ventricular filling pressure. 4. Successful IVUS-guided PCI to the LMCA and proximal LCx using a Synergy 3.5 x 24 mm drug-eluting stent with 0% residual stenosis and TIMI-3 flow. Recommendations: 1. Indefinite dual antiplatelet therapy with aspirin and clopidogrel.  If apixaban is to be restarted, I would favor discontinuation of aspirin and use of apixaban and clopidogrel for at least 12 months. 2. Restart heparin infusion 2 hours after TR band removal, given history of paroxysmal atrial  fibrillation with prior stroke. 3. Aggressive secondary prevention. 4. Restart diuresis; will given furosemide 80 mg IV x 1 now, with further dosing to be determined by output and renal function.  Optimize evidence-based heart failure therapy, as tolerated. [name], MD [facility]     Admission status: The appropriate admission status for this patient is INPATIENT. Inpatient status is judged to be reasonable and necessary in order to provide the required intensity of service to ensure the patient's safety. The patient's presenting symptoms, physical  exam findings, and initial radiographic and laboratory data in the context of their chronic comorbidities is felt to place them at high risk for further clinical deterioration. Furthermore, it is not anticipated that the patient will be medically stable for discharge from the hospital within 2 midnights of admission. The following factors support the admission status of inpatient.    The patient's presenting symptoms include chest pain The worrisome physical exam findings include  The initial radiographic and laboratory data are worrisome because of elevated troponin, left circumflex stenosis seen on cardiac cath The chronic co-morbidities include obesity, hypertension, CAD     I certify that at the point of admission it is my clinical judgment that the patient will require inpatient hospital care spanning beyond 2 midnights from the point of admission due to high intensity of service, high risk for further deterioration and high frequency of surveillance required.   Oswald Hillock   [facility] If 7PM-7AM, please contact night-coverage at [email or web address], Office  [phone]   11/25/2019, 3:33 PM  LOS: 5 days

## 2019-11-25 NOTE — Progress Notes (Signed)
Inpatient Diabetes Program Recommendations  AACE/ADA: New Consensus Statement on Inpatient Glycemic Control (2015)  Target Ranges:  Prepandial:   less than 140 mg/dL      Peak postprandial:   less than 180 mg/dL (1-2 hours)      Critically ill patients:  140 - 180 mg/dL   Lab Results  Component Value Date   GLUCAP 226 (H) 11/25/2019   HGBA1C 8.0 (H) 11/20/2019    Review of Glycemic Control Results for Vernon Briggs, Vernon Briggs (MRN [id number]) as of 11/25/2019 10:29  Ref. Range 11/24/2019 11:28 11/24/2019 15:48 11/24/2019 21:57 11/25/2019 08:12  Glucose-Capillary Latest Ref Range: 70 - 99 mg/dL 258 (H) 199 (H) 238 (H) 226 (H)   Inpatient Diabetes Program Recommendations: Consider ordering:  - Increasing Lantus to 36 units QHS -Novolog 8 units TID with meals for meal coverage if patient eats at least 50% of meals.  Thanks, Bronson Curb, MSN, RNC-OB Diabetes Coordinator [phone] (8a-5p)

## 2019-11-25 NOTE — Progress Notes (Signed)
ANTICOAGULATION CONSULT NOTE  Pharmacy Consult for heparin Indication: chest pain/ACS, atrial fibrillation  Patient Measurements: Height: 6' (182.9 cm) Weight: 290 lb 4.8 oz (131.7 kg) IBW/kg (Calculated) : 77.6 Heparin Dosing Weight: 106kg  Vital Signs: Temp: 98 F (36.7 C) (03/23 1000) Temp Source: Oral (03/23 1000) BP: 151/76 (03/23 1000) Pulse Rate: 62 (03/23 0626)  Labs: Recent Labs    11/22/19 1340 11/22/19 2102 11/23/19 0403 11/23/19 0403 11/24/19 0517 11/25/19 0850  HGB  --   --  11.7*   < > 11.3* 11.8*  HCT  --   --  35.6*  --  34.1* 35.6*  PLT  --   --  67*  --  69* 89*  APTT 58* 74* 71*  --   --   --   HEPARINUNFRC  --   --  0.41  --  0.27* 0.32  CREATININE  --   --  1.85*  --  1.98* 1.71*   < > = values in this interval not displayed.    Estimated Creatinine Clearance: 52.4 mL/min (A) (by C-G formula based on SCr of 1.71 mg/dL (H)).   Assessment: 83 YOM presenting with chest pain with hx CAD s/p CABG, hx of PAF (CHADS2VASc = 6) previously on warfarin now on Eliquis (last dose 3/17). He is noted with chronic thrombocytopenia     Underwent cardiac cath with successful PCI to LMCA/LCx with DES on 3/22. Heparin was restarted after cath. Plans for possible plavix and apixaban only (no ASA)  Goal of Therapy:  Heparin level 0.3= 0.7 units/ml Monitor platelets by anticoagulation protocol: Yes   Plan:  No heparin changes needed Anticipate change to apixaban today  Hildred Laser, PharmD Clinical Pharmacist **Pharmacist phone directory can now be found on [email or web address] ([email or web address]).  Listed under [place].

## 2019-11-26 LAB — GLUCOSE, CAPILLARY
Glucose-Capillary: 230 mg/dL — ABNORMAL HIGH (ref 70–99)
Glucose-Capillary: 282 mg/dL — ABNORMAL HIGH (ref 70–99)

## 2019-11-26 LAB — BASIC METABOLIC PANEL
Anion gap: 10 (ref 5–15)
BUN: 61 mg/dL — ABNORMAL HIGH (ref 8–23)
CO2: 26 mmol/L (ref 22–32)
Calcium: 8.9 mg/dL (ref 8.9–10.3)
Chloride: 103 mmol/L (ref 98–111)
Creatinine, Ser: 1.83 mg/dL — ABNORMAL HIGH (ref 0.61–1.24)
GFR calc Af Amer: 41 mL/min — ABNORMAL LOW (ref >60)
GFR calc non Af Amer: 35 mL/min — ABNORMAL LOW (ref >60)
Glucose, Bld: 246 mg/dL — ABNORMAL HIGH (ref 70–99)
Potassium: 4.3 mmol/L (ref 3.5–5.1)
Sodium: 139 mmol/L (ref 135–145)

## 2019-11-26 LAB — CBC
HCT: 35.3 % — ABNORMAL LOW (ref 39.0–52.0)
Hemoglobin: 11.8 g/dL — ABNORMAL LOW (ref 13.0–17.0)
MCH: 33.6 pg (ref 26.0–34.0)
MCHC: 33.4 g/dL (ref 30.0–36.0)
MCV: 100.6 fL — ABNORMAL HIGH (ref 80.0–100.0)
Platelets: 96 10*3/uL — ABNORMAL LOW (ref 150–400)
RBC: 3.51 MIL/uL — ABNORMAL LOW (ref 4.22–5.81)
RDW: 13.9 % (ref 11.5–15.5)
WBC: 5.8 10*3/uL (ref 4.0–10.5)
nRBC: 0 % (ref 0.0–0.2)

## 2019-11-26 MED ORDER — ISOSORBIDE MONONITRATE ER 30 MG PO TB24: 30.0000 mg | ORAL_TABLET | Freq: Every day | ORAL | 0 refills | Status: AC

## 2019-11-26 MED ORDER — ROSUVASTATIN CALCIUM 20 MG PO TABS: 20.0000 mg | ORAL_TABLET | Freq: Every day | ORAL | 0 refills | Status: AC

## 2019-11-26 MED ORDER — CARVEDILOL 3.125 MG PO TABS: 3.1250 mg | ORAL_TABLET | Freq: Two times a day (BID) | ORAL | 0 refills | Status: AC

## 2019-11-26 MED ORDER — ISOSORBIDE MONONITRATE ER 30 MG PO TB24
30.0000 mg | ORAL_TABLET | Freq: Every day | ORAL | Status: DC
  Administered 2019-11-26: 10:00:00 30 mg via ORAL
  Filled 2019-11-26: qty 1

## 2019-11-26 NOTE — Progress Notes (Signed)
Inpatient Diabetes Program Recommendations  AACE/ADA: New Consensus Statement on Inpatient Glycemic Control (2015)  Target Ranges:  Prepandial:   less than 140 mg/dL      Peak postprandial:   less than 180 mg/dL (1-2 hours)      Critically ill patients:  140 - 180 mg/dL   Lab Results  Component Value Date   GLUCAP 230 (H) 11/26/2019   HGBA1C 8.0 (H) 11/20/2019    Review of Glycemic Control Results for Vernon Briggs, Vernon Briggs (MRN [id number]) as of 11/26/2019 10:21  Ref. Range 11/25/2019 08:12 11/25/2019 11:32 11/25/2019 16:59 11/25/2019 21:28 11/26/2019 08:17  Glucose-Capillary Latest Ref Range: 70 - 99 mg/dL 226 (H) 285 (H) 231 (H) 176 (H) 230 (H)   Current orders for Inpatient glycemic control:  Lantus 36 units Novolog 0-20 units tid + hs Novolog 8 units tid meal coverage  Inpatient Diabetes Program Recommendations:  Fasting glucose 230 this am.  - Increasing Lantus to 42 units QHS   Thanks, Tama Headings RN, MSN, BC-ADM Inpatient Diabetes Coordinator Team Pager [phone] (8a-5p)

## 2019-11-26 NOTE — Progress Notes (Addendum)
Progress Note  Patient Name: Creede Coniglio Date of Encounter: 11/26/2019  Primary Cardiologist: Fransico Him, MD   Subjective   Patient had stent placed 11/24/19. No chest pain but patient does feel tired and weak.  Inpatient Medications    Scheduled Meds: . apixaban  5 mg Oral BID  . carvedilol  3.125 mg Oral BID WC  . clopidogrel  75 mg Oral Daily  . donepezil  5 mg Oral QHS  . FLUoxetine  40 mg Oral QHS  . icosapent Ethyl  2 g Oral BID  . insulin aspart  0-20 Units Subcutaneous TID WC  . insulin aspart  0-5 Units Subcutaneous QHS  . insulin aspart  8 Units Subcutaneous TID WC  . insulin glargine  36 Units Subcutaneous QHS  . magnesium oxide  400 mg Oral BID  . rosuvastatin  20 mg Oral QHS  . sodium chloride flush  3 mL Intravenous Q12H  . sodium chloride flush  3 mL Intravenous Q12H  . torsemide  40 mg Oral Daily   Continuous Infusions: . sodium chloride     PRN Meds: sodium chloride, acetaminophen **OR** acetaminophen, nitroGLYCERIN, ondansetron **OR** ondansetron (ZOFRAN) IV, sodium chloride flush   Vital Signs    Vitals:   11/25/19 1758 11/25/19 1950 11/25/19 2216 11/26/19 0522  BP: (!) 142/83 (!) 130/56  (!) 154/71  Pulse: 63 62 62 (!) 57  Resp:  19 20 (!) 23  Temp:  98.3 F (36.8 C)  97.9 F (36.6 C)  TempSrc:  Oral  Oral  SpO2:  100% 100% 100%  Weight:    131.8 kg  Height:        Intake/Output Summary (Last 24 hours) at 11/26/2019 0905 Last data filed at 11/25/2019 2251 Gross per 24 hour  Intake 606 ml  Output 1450 ml  Net -844 ml   Last 3 Weights 11/26/2019 11/25/2019 11/24/2019  Weight (lbs) 290 lb 8 oz 290 lb 4.8 oz 292 lb 12.8 oz  Weight (kg) 131.77 kg 131.679 kg 132.813 kg      Telemetry    Sinus with occasional ventricular pacing, Hr in the 60s - Personally Reviewed  ECG    No new - Personally Reviewed  Physical Exam   GEN: No acute distress.   Neck: No JVD Cardiac: RRR, no murmurs, rubs, or gallops.  Respiratory: Clear to  auscultation bilaterally. GI: Soft, nontender, non-distended  MS: 1-2+ lower leg edema; No deformity. Neuro:  Nonfocal  Psych: Normal affect   Labs    High Sensitivity Troponin:   Recent Labs  Lab 11/19/19 2207 11/20/19 0007 11/20/19 0336 11/20/19 0611  TROPONINIHS 55* 95* 314* 485*      Chemistry Recent Labs  Lab 11/20/19 0336 11/21/19 0329 11/24/19 0517 11/25/19 0850 11/26/19 0400  NA 141   < > 137 138 139  K 4.4   < > 4.5 4.5 4.3  CL 104   < > 102 105 103  CO2 28   < > 25 22 26   GLUCOSE 211*   < > 292* 238* 246*  BUN 45*   < > 65* 58* 61*  CREATININE 1.83*   < > 1.98* 1.71* 1.83*  CALCIUM 8.8*   < > 8.6* 8.6* 8.9  PROT 5.8*  --   --   --   --   ALBUMIN 3.5  --   --   --   --   AST 27  --   --   --   --  ALT 21  --   --   --   --   ALKPHOS 77  --   --   --   --   BILITOT 0.9  --   --   --   --   GFRNONAA 35*   < > 32* 38* 35*  GFRAA 41*   < > 37* 44* 41*  ANIONGAP 9   < > 10 11 10    < > = values in this interval not displayed.     Hematology Recent Labs  Lab 11/24/19 0517 11/25/19 0850 11/26/19 0400  WBC 6.2 6.6 5.8  RBC 3.40* 3.58* 3.51*  HGB 11.3* 11.8* 11.8*  HCT 34.1* 35.6* 35.3*  MCV 100.3* 99.4 100.6*  MCH 33.2 33.0 33.6  MCHC 33.1 33.1 33.4  RDW 14.1 13.9 13.9  PLT 69* 89* 96*    BNPNo results for input(s): BNP, PROBNP in the last 168 hours.   DDimer  Recent Labs  Lab 11/20/19 [id number]  DDIMER 0.69*     Radiology    CARDIAC CATHETERIZATION  Result Date: 11/24/2019 Conclusions: 1. Severe left coronary artery disease, including 90% mid/distal LMCA stenosis and chronic total occlusion of proximal LAD (supplied by patent LIMA-LAD graft). 2. Patent stents in the proximal through distal LCx. 3. Moderately elevated left-ventricular filling pressure. 4. Successful IVUS-guided PCI to the LMCA and proximal LCx using a Synergy 3.5 x 24 mm drug-eluting stent with 0% residual stenosis and TIMI-3 flow. Recommendations: 1. Indefinite dual  antiplatelet therapy with aspirin and clopidogrel.  If apixaban is to be restarted, I would favor discontinuation of aspirin and use of apixaban and clopidogrel for at least 12 months. 2. Restart heparin infusion 2 hours after TR band removal, given history of paroxysmal atrial fibrillation with prior stroke. 3. Aggressive secondary prevention. 4. Restart diuresis; will given furosemide 80 mg IV x 1 now, with further dosing to be determined by output and renal function.  Optimize evidence-based heart failure therapy, as tolerated. [name], MD [facility]    Cardiac Studies   Cardiac Cath 11/24/19 Conclusions: 1. Severe left coronary artery disease, including 90% mid/distal LMCA stenosis and chronic total occlusion of proximal LAD (supplied by patent LIMA-LAD graft). 2. Patent stents in the proximal through distal LCx. 3. Moderately elevated left-ventricular filling pressure. 4. Successful IVUS-guided PCI to the LMCA and proximal LCx using a Synergy 3.5 x 24 mm drug-eluting stent with 0% residual stenosis and TIMI-3 flow.  Recommendations: 1. Indefinite dual antiplatelet therapy with aspirin and clopidogrel.  If apixaban is to be restarted, I would favor discontinuation of aspirin and use of apixaban and clopidogrel for at least 12 months. 2. Restart heparin infusion 2 hours after TR band removal, given history of paroxysmal atrial fibrillation with prior stroke. 3. Aggressive secondary prevention. 4. Restart diuresis; will given furosemide 80 mg IV x 1 now, with further dosing to be determined by output and renal function.  Optimize evidence-based heart failure therapy, as tolerated.  [name], MD [facility]  Coronary Diagrams  Diagnostic Dominance: Right  Intervention    Echo 11/20/19 1. Left ventricular ejection fraction, by estimation, is 25 to 30%. The  left ventricle has severely decreased function. The left ventricle  demonstrates regional wall motion  abnormalities (see scoring  diagram/findings for description). Left ventricular  diastolic function could not be evaluated.  2. Right ventricular systolic function is mildly reduced. The right  ventricular size is mildly enlarged. There is mildly elevated pulmonary  artery systolic pressure. The  estimated right ventricular systolic  pressure is 123XX123 mmHg.  3. The mitral valve is grossly normal. Mild mitral valve regurgitation.  No evidence of mitral stenosis.  4. Tricuspid valve regurgitation is mild to moderate.  5. The aortic valve is tricuspid. Aortic valve regurgitation is not  visualized. No aortic stenosis is present.  6. Difficult study with poor windows despite contrast. LVEF severely  reduced 25-30%. Mid septum into apex is akinetic consistent with prior  infarction. No LV thrombus on contrast imaging.   Patient Profile     74 y.o. male of CAD status post CABGx44/19/1999 ([facility] to LAD, SVG todRCA, SVG to OM1, SVG toRI)with PCIx6in approximately 2011([facility])with most recentPCI/DES to [phone] at [facility], CKD stage III,DM 2, hypertension, hyperlipidemia, obesity, PAFon Eliquis, prior left frontal CVA and ICMs/pICD in 2013now admitted 11/19/19 with chest pain. Hs troponin elevated.  He is now status post LM stent.    Assessment & Plan    Paroxysmal AFib - h/o of CVA and thrombocytopenia - Patient needs to be on longterm a/c with Eliquis - Plan to discharge on Plavix and ASA  NSTEMI/CAD s/p CABG in 1999 and subsequent PCI - H/o of PCI LCx in 2016 - Cath this admission showed severe LCA disease and patent stents in the LCx treated with PCI to the LMCA and prox LCx - Pan for Plavix and Eliquis. No aspirin - No further chest pain. - Plan to work with cardiac rehab today. - cath site, right radial is stable  CKD stage 3 - creatinine 1.71>1.83 - Hold on ACE  HTN - Lisinopril held as above - BP were up and Coreg restarted. Home Imdur started   Ischemic CM - Echo with EF 25-30%, mildly reduced RV function - Cath showed elevated EDP - Given IV diuretics>>transtitioned to Torsemide 40 mg daily - Spiro held - continue to monitor creatinine with diuretic  Thrombocytopenia - stable  For questions or updates, please contact [place] Please consult [email or web address] for contact info under       Signed, Cadence Ninfa Meeker, PA-C  11/26/2019, 9:05 AM    History and all data above reviewed.  Patient examined.  I agree with the findings as above.  Ready to go home today.  The patient exam reveals COR:RRR  ,  Lungs: Clear  ,  Abd: Positive bowel sounds, no rebound no guarding, Ext Mild/mod edema  .  All available labs, radiology testing, previous records reviewed. Agree with documented assessment and plan.   CAD:  Status post PCI.  I had a discussion with the patient and his wife.  They understand the importance of [facility] to [facility] creat within seven days and that we are holding two of his meds pending this repeat lab.  He will need to be restarted eventually on lisinopril and spironolactone as his BP will require this and for OMT HF treatment.   Jeneen Rinks Freman Lapage  12:36 PM  11/26/2019

## 2019-11-26 NOTE — Evaluation (Signed)
Physical Therapy Evaluation Patient Details Name: Vernon Briggs MRN: [id number] DOB: 11/27/1945 Today's Date: 11/26/2019   History of Present Illness  74 year old male with a history of CAD s/p CABG and stenting in 2016, CKD stage III, diabetes mellitus type 2, hypertension, paroxysmal atrial fibrillation, ischemic cardiomyopathy status post AICD presented to the ED with complaints of chest pain. Admited with NSTEMI and is s/p s/p left main stent 3/22.  Clinical Impression   Pt admitted with above diagnosis. PTA was home with spouse and was quite independent, where he needed help (r spouse was cautious) spouse helped out. Home has ramp built by [place] and pt has motorized scooter for ramp, spouse reports is looking into changing shower to walk in and commode to handicapped height.  Pt currently with functional limitations due to the deficits listed below (see PT Problem List). Pt is at stand by assist to min guard with mobility this am, was able to get up from bed, low commode and also ambulate in hall approx 167ft with min guard assist and RW. Pt fatigued quickly but this was expected. Monitor reading desat to70s with ambulation but with adjustment of probe sats in 80s and with cues for pursed lip breathing sats in high 90s within seconds. Pt will benefit from skilled PT to increase his independence and safety with mobility to allow discharge to the venue listed below. Pt will benefit from continued therapy once dc and this can be achieved via home health PT.      Follow Up Recommendations Home health PT    Equipment Recommendations  None recommended by PT    Recommendations for Other Services       Precautions / Restrictions Precautions Precautions: Fall;ICD/Pacemaker Restrictions Weight Bearing Restrictions: No      Mobility  Bed Mobility Overal bed mobility: Needs Assistance Bed Mobility: Supine to Sit;Sit to Supine     Supine to sit: Supervision Sit to supine: Supervision    General bed mobility comments: needs bed fixtures to complete  Transfers Overall transfer level: Needs assistance Equipment used: Rolling walker (2 wheeled) Transfers: Stand Pivot Transfers;Sit to/from Stand Sit to Stand: Supervision Stand pivot transfers: Supervision       General transfer comment: able to get up from bed, low comode with supervision  Ambulation/Gait Ambulation/Gait assistance: Supervision Gait Distance (Feet): 100 Feet Assistive device: Rolling walker (2 wheeled) Gait Pattern/deviations: Step-through pattern;Wide base of support Gait velocity: decreased   General Gait Details: fatigues quickly needed standing rest breaks to complete distance, on room air monitor reading desat to 70s but with adjustment of probe dest in 80s and able to quickly recover to 90s with pursed lip breathing  Stairs            Wheelchair Mobility    Modified Rankin (Stroke Patients Only)       Balance Overall balance assessment: Needs assistance Sitting-balance support: Feet supported Sitting balance-Leahy Scale: Good     Standing balance support: During functional activity;Bilateral upper extremity supported Standing balance-Leahy Scale: Fair                               Pertinent Vitals/Pain Pain Assessment: No/denies pain    Home Living Family/patient expects to be discharged to:: Private residence Living Arrangements: Spouse/significant other Available Help at Discharge: Family Type of Home: House Home Access: Stairs to enter;Ramped entrance(va installed ramp)   Technical brewer of Steps: 3 Home Layout: One level Home Equipment: IT trainer  scooter;Walker - 2 wheels;Wheelchair - manual;Cane - quad;Grab bars - tub/shower;Shower seat      Prior Function Level of Independence: Independent         Comments: spouse assists as needed for safety     Hand Dominance   Dominant Hand: Left    Extremity/Trunk Assessment   Upper Extremity  Assessment Upper Extremity Assessment: Overall WFL for tasks assessed    Lower Extremity Assessment Lower Extremity Assessment: Generalized weakness    Cervical / Trunk Assessment Cervical / Trunk Assessment: Normal  Communication   Communication: No difficulties  Cognition Arousal/Alertness: Awake/alert Behavior During Therapy: WFL for tasks assessed/performed Overall Cognitive Status: Within Functional Limits for tasks assessed                                        General Comments      Exercises     Assessment/Plan    PT Assessment Patient needs continued PT services  PT Problem List Decreased activity tolerance;Decreased balance;Decreased mobility;Decreased coordination;Decreased safety awareness       PT Treatment Interventions Gait training;DME instruction;Stair training;Functional mobility training;Therapeutic activities;Therapeutic exercise;Balance training;Neuromuscular re-education;Patient/family education    PT Goals (Current goals can be found in the Care Plan section)  Acute Rehab PT Goals Patient Stated Goal: go home PT Goal Formulation: With patient/family Time For Goal Achievement: 12/10/19 Potential to Achieve Goals: Good    Frequency Min 3X/week   Barriers to discharge        Co-evaluation               AM-PAC PT "6 Clicks" Mobility  Outcome Measure Help needed turning from your back to your side while in a flat bed without using bedrails?: None Help needed moving from lying on your back to sitting on the side of a flat bed without using bedrails?: None Help needed moving to and from a bed to a chair (including a wheelchair)?: A Little Help needed standing up from a chair using your arms (e.g., wheelchair or bedside chair)?: A Little Help needed to walk in hospital room?: A Little Help needed climbing 3-5 steps with a railing? : A Lot 6 Click Score: 19    End of Session   Activity Tolerance: Patient limited by  fatigue;Patient tolerated treatment well Patient left: in bed;with call bell/phone within reach;with family/visitor present Nurse Communication: Mobility status;Other (comment)(post tx disposition) PT Visit Diagnosis: Other abnormalities of gait and mobility (R26.89)    Time: 1047-1110 PT Time Calculation (min) (ACUTE ONLY): 23 min   Charges:   PT Evaluation $PT Eval Moderate Complexity: 1 Mod PT Treatments $Therapeutic Activity: 8-22 mins        Horald Chestnut, PT   Delford Field 11/26/2019, 11:21 AM

## 2019-11-26 NOTE — TOC Initial Note (Signed)
Transition of Care [facility]) - Initial/Assessment Note    Patient Details  Name: Vernon Briggs MRN: [id number] Date of Birth: 06-22-1946  Transition of [facility]) CM/SW Contact:    Bethena Roys, RN Phone Number: 11/26/2019, 3:09 PM  Clinical Narrative: Pt presented for Nstemi- post cardiac cath. Prior to arrival patient was from home with support of wife. Physical Therapy (PT) recommendations for home health PT and patient is agreeable. Medicare.gov list provided to the patient and copy placed in shadow chart. Patient chose [facility]- unable to assist due to staffing. Case Manager asked if ok to use [facility]- patient agreeable and referral sent to [facility]- able to service and start of care to begin within 24-48 hours post transition home. Wife to transport home via private vehicle. No further needs from Case Manager at this time.   Expected Discharge Plan: [place] Barriers to Discharge: No Barriers Identified   Patient Goals and CMS Choice Patient states their goals for this hospitalization and ongoing recovery are:: "to return home" CMS Medicare.gov Compare Post Acute Care list provided to:: Patient Choice offered to / list presented to : Patient  Expected Discharge Plan and Services Expected Discharge Plan: [place] In-house Referral: NA Discharge Planning Services: CM Consult Post Acute Care Choice: [place] arrangements for the past 2 months: Single Family Home Expected Discharge Date: 11/26/19               DME Arranged: N/A         HH Arranged: PT HH Agency: [place] Date [place]: 11/27/19 Time HH Agency Contacted: 1508 Representative spoke with at [place]: Tommi Rumps  Prior Living Arrangements/Services Living arrangements for the past 2 months: [place] Lives with:: Spouse Patient language and need for interpreter reviewed:: Yes Do you feel safe going back to the place where  you live?: Yes      Need for Family Participation in Patient Care: Yes (Comment) Care giver support system in place?: Yes (comment)   Criminal Activity/Legal Involvement Pertinent to Current Situation/Hospitalization: No - Comment as needed  Activities of Daily Living Home Assistive Devices/Equipment: Cane (specify quad or straight), Walker (specify type) ADL Screening (condition at time of admission) Patient's cognitive ability adequate to safely complete daily activities?: Yes Is the patient deaf or have difficulty hearing?: No Does the patient have difficulty seeing, even when wearing glasses/contacts?: No Does the patient have difficulty concentrating, remembering, or making decisions?: No Patient able to express need for assistance with ADLs?: Yes Does the patient have difficulty dressing or bathing?: No Independently performs ADLs?: Yes (appropriate for developmental age) Does the patient have difficulty walking or climbing stairs?: No Weakness of Legs: Both Weakness of Arms/Hands: None  Permission Sought/Granted Permission sought to share information with : Family Supports, Chartered certified accountant granted to share information with : Yes, Verbal Permission Granted     Permission granted to share info w AGENCY: [place]        Emotional Assessment Appearance:: Appears stated age Attitude/Demeanor/Rapport: Engaged Affect (typically observed): Appropriate Orientation: : Oriented to Situation, Oriented to  Time, Oriented to Place, Oriented to Self Alcohol / Substance Use: Not Applicable Psych Involvement: No (comment)  Admission diagnosis:  Unstable angina ([place]) [I20.0] Chest pain [R07.9] Patient Active Problem List   Diagnosis Date Noted  . Acute systolic heart failure ([place])   . CKD (chronic kidney disease) stage 2, GFR 60-89 ml/min 11/20/2019  . NSTEMI (non-ST  elevated myocardial infarction) ([place]) 11/20/2019  . Obesity 11/20/2019  . Unstable angina  ([place])   . Chest pain 04/02/2015  . Acute renal failure ([place]) 04/02/2015  . Weakness generalized 04/02/2015  . Cardiomyopathy, ischemic 09/15/2014  . CAD S/P CFX DES 09/14/14 09/15/2014  . PAF (paroxysmal atrial fibrillation) ([place]) 09/15/2014  . Chronic anticoagulation-Coumadin 09/15/2014  . S/P CABG x 12-1997   . Elevated troponin   . Abnormal nuclear stress test 09/11/2014    Class: Diagnosis of  . Acute coronary syndrome ([place]) 09/09/2014  . Hypertensive urgency 09/09/2014  . Essential hypertension   . History of CVA (cerebrovascular accident) 10/20/2012  . TIA (transient ischemic attack) 10/20/2012  . Automatic implantable cardioverter-defibrillator in situ 10/20/2012  . Type II diabetes mellitus with complication ([place]) [id number]  . Hyperlipidemia with target LDL less than 70 10/20/2012  . Thrombocytopenia-plts 90-70K 10/20/2012   PCP:  [name], MD Pharmacy:   [id number] - [place], [address] [place] [address] Phone: [phone] Fax: [place], [place] [address] Phone: [phone] Fax: [phone]     Social Determinants of Health (SDOH) Interventions    Readmission Risk Interventions No flowsheet data found.

## 2019-11-26 NOTE — Discharge Summary (Signed)
Physician Discharge Summary  Vernon Briggs [id number] DOB: 07-Jan-1946 DOA: 11/19/2019  PCP: [name], MD  Admit date: 11/19/2019 Discharge date: 11/26/2019  Admitted From: Home Disposition: Home  Recommendations for Outpatient Follow-up:  1. Follow up with PCP in 1-2 weeks 2. Please obtain BMP/CBC in one week your next doctors visit.  3. Discontinue aspirin, will go home on Plavix and Eliquis 4. Hold off on Aldactone and lisinopril, slowly increase it and restarted outpatient as his blood pressure improves 5. Continue Coreg and Imdur as prescribed 6. Continue daily 40 mg torsemide.  Home Health: PT Equipment/Devices: Discharge Condition: Stable CODE STATUS: Full Diet recommendation: 2 g salt diet/heart healthy  Brief/Interim Summary: 74 year old male with a history of CAD s/p CABG and stenting in 2016, CKD stage III, diabetes mellitus type 2, hypertension, paroxysmal atrial fibrillation, ischemic cardiomyopathy status post AICD presented to the ED with complaints of chest pain.  Patient stated that he was having chest pain and shortness of breath for past 1 month.  In the ED EKG showed normal sinus rhythm with IVCD.  Chest x-ray was unremarkable.  COVID-19 test was negative.  Creatinine was 1.8 which increased from baseline around 1.6.  Patient admitted with chest pain with concern for ACS.  Cardiology was consulted.  During the hospitalization patient was treated with IV diuretics, underlying left heart catheterization status post stenting of the left main artery.  Cardiology recommended outpatient follow-up, discontinuing aspirin but continue using Plavix and Eliquis.  Cardiac medications as adjusted and mentioned above.  Atypical chest pain, resolved Coronary artery disease status post CABG History of ischemic cardiomyopathy status post AICD -Medically doing well.  Tolerated left heart catheterization status post left main stenting.  Discontinue aspirin, continue Plavix  and Eliquis.  Hold off on Aldactone and lisinopril but continue Coreg and Imdur.  Appreciate cardiology input.  Diabetes mellitus type 2, insulin-dependent -Resume home regimen  Essential hypertension -Currently well controlled on Coreg and Imdur.  Slowly resume lisinopril and Aldactone as appropriate outpatient.  Hyperlipidemia -Continue statin  Paroxysmal atrial fibrillation -Continue Coreg and Eliquis.  Sleep apnea-continue CPAP   Discharge Diagnoses:  Principal Problem:   NSTEMI (non-ST elevated myocardial infarction) [facility]) Active Problems:   History of CVA (cerebrovascular accident)   Automatic implantable cardioverter-defibrillator in situ   Type II diabetes mellitus with complication (HCC)   Thrombocytopenia-plts 90-70K   Essential hypertension   S/P CABG x 12-1997   Cardiomyopathy, ischemic   CAD S/P CFX DES 09/14/14   PAF (paroxysmal atrial fibrillation) (HCC)   Chest pain   CKD (chronic kidney disease) stage 2, GFR 60-89 ml/min   Unstable angina (HCC)   Obesity   Acute systolic heart failure [facility])    Consultations:  Cardiology  Subjective: Feels great, tolerating CPAP denies any chest pain wishes to go home.  Discharge Exam: Vitals:   11/26/19 0522 11/26/19 1011  BP: (!) 154/71 (!) 151/65  Pulse: (!) 57 64  Resp: (!) 23 19  Temp: 97.9 F (36.6 C)   SpO2: 100% 100%   Vitals:   11/25/19 1950 11/25/19 2216 11/26/19 0522 11/26/19 1011  BP: (!) 130/56  (!) 154/71 (!) 151/65  Pulse: 62 62 (!) 57 64  Resp: 19 20 (!) 23 19  Temp: 98.3 F (36.8 C)  97.9 F (36.6 C)   TempSrc: Oral  Oral   SpO2: 100% 100% 100% 100%  Weight:   131.8 kg   Height:        General: Pt is alert, awake, not  in acute distress Cardiovascular: RRR, S1/S2 +, no rubs, no gallops Respiratory: CTA bilaterally, no wheezing, no rhonchi Abdominal: Soft, NT, ND, bowel sounds + Extremities: no edema, no cyanosis  Discharge Instructions  Discharge Instructions    AMB Referral  to Cardiac Rehabilitation - Phase II   Complete by: As directed    Diagnosis:  Coronary Stents NSTEMI     After initial evaluation and assessments completed: Virtual Based Care may be provided alone or in conjunction with Phase 2 Cardiac Rehab based on patient barriers.: Yes   Diet - low sodium heart healthy   Complete by: As directed    Increase activity slowly   Complete by: As directed      Allergies as of 11/26/2019      Reactions   Tape Other (See Comments)   Causes skin irritation      Medication List    STOP taking these medications   aspirin 81 MG chewable tablet   lisinopril 40 MG tablet Commonly known as: ZESTRIL   spironolactone 25 MG tablet Commonly known as: ALDACTONE     TAKE these medications   acetaminophen 325 MG tablet Commonly known as: TYLENOL Take 2 tablets (650 mg total) by mouth every 4 (four) hours as needed for headache or mild pain. What changed: how much to take   carvedilol 3.125 MG tablet Commonly known as: COREG Take 1 tablet (3.125 mg total) by mouth 2 (two) times daily with a meal. What changed: Another medication with the same name was removed. Continue taking this medication, and follow the directions you see here.   clopidogrel 75 MG tablet Commonly known as: PLAVIX Take 75 mg by mouth daily.   Eliquis 5 MG Tabs tablet Generic drug: apixaban Take 5 mg by mouth every 12 (twelve) hours.   FLUoxetine 20 MG capsule Commonly known as: PROZAC Take 40 mg by mouth at bedtime.   HumuLIN R U-500 KwikPen 500 UNIT/ML kwikpen Generic drug: insulin regular human CONCENTRATED Inject 55-110 Units into the skin See admin instructions. Inject 110 units into the skin in the morning before breakfast and 55 units at bedtime   hydroxypropyl methylcellulose / hypromellose 2.5 % ophthalmic solution Commonly known as: ISOPTO TEARS / GONIOVISC Place 1 drop into both eyes 2 (two) times daily as needed for dry eyes.   isosorbide mononitrate 30 MG 24  hr tablet Commonly known as: IMDUR Take 1 tablet (30 mg total) by mouth daily. Start taking on: November 27, 2019 What changed: when to take this   ketoconazole 2 % cream Commonly known as: NIZORAL Apply 1 application topically daily as needed for irritation.   loperamide 2 MG tablet Commonly known as: IMODIUM A-D Take 2 mg by mouth daily as needed for diarrhea or loose stools.   nitroGLYCERIN 0.4 MG SL tablet Commonly known as: NITROSTAT Place 1 tablet (0.4 mg total) under the tongue every 5 (five) minutes x 3 doses as needed for chest pain.   rosuvastatin 20 MG tablet Commonly known as: CRESTOR Take 1 tablet (20 mg total) by mouth at bedtime. What changed: how much to take   torsemide 20 MG tablet Commonly known as: DEMADEX Take 40 mg by mouth in the morning.   Vitamin D-3 25 MCG (1000 UT) Caps Take 1,000 Units by mouth daily.       Allergies  Allergen Reactions  . Tape Other (See Comments)    Causes skin irritation    You were cared for by a hospitalist during your  hospital stay. If you have any questions about your discharge medications or the care you received while you were in the hospital after you are discharged, you can call the unit and asked to speak with the hospitalist on call if the hospitalist that took care of you is not available. Once you are discharged, your primary care physician will handle any further medical issues. Please note that no refills for any discharge medications will be authorized once you are discharged, as it is imperative that you return to your primary care physician (or establish a relationship with a primary care physician if you do not have one) for your aftercare needs so that they can reassess your need for medications and monitor your lab values.   Procedures/Studies: DG Chest 2 View  Result Date: 11/19/2019 CLINICAL DATA:  74 year old male with chest pain. EXAM: CHEST - 2 VIEW COMPARISON:  Chest radiograph dated 04/02/2015.  FINDINGS: There is shallow inspiration. Left lung base linear densities, likely related to atelectatic changes and interstitial crowding. Asymmetric edema is less likely. Clinical correlation recommended. No focal consolidation, pleural effusion, or pneumothorax. Stable cardiomegaly. Median sternotomy wires and left pectoral AICD device. No acute osseous pathology. IMPRESSION: Shallow inspiration.  No focal consolidation. Electronically Signed   By: Anner Crete M.D.   On: 11/19/2019 22:36   CARDIAC CATHETERIZATION  Result Date: 11/24/2019 Conclusions: 1. Severe left coronary artery disease, including 90% mid/distal LMCA stenosis and chronic total occlusion of proximal LAD (supplied by patent LIMA-LAD graft). 2. Patent stents in the proximal through distal LCx. 3. Moderately elevated left-ventricular filling pressure. 4. Successful IVUS-guided PCI to the LMCA and proximal LCx using a Synergy 3.5 x 24 mm drug-eluting stent with 0% residual stenosis and TIMI-3 flow. Recommendations: 1. Indefinite dual antiplatelet therapy with aspirin and clopidogrel.  If apixaban is to be restarted, I would favor discontinuation of aspirin and use of apixaban and clopidogrel for at least 12 months. 2. Restart heparin infusion 2 hours after TR band removal, given history of paroxysmal atrial fibrillation with prior stroke. 3. Aggressive secondary prevention. 4. Restart diuresis; will given furosemide 80 mg IV x 1 now, with further dosing to be determined by output and renal function.  Optimize evidence-based heart failure therapy, as tolerated. [name], MD [facility]   CARDIAC CATHETERIZATION  Result Date: 11/21/2019  Prox RCA to Mid RCA lesion is 100% stenosed.  Prox Cx to Dist Cx lesion is 10% stenosed.  Mid LM lesion is 90% stenosed.  Ost LAD to Prox LAD lesion is 100% stenosed.  LIMA graft was visualized by angiography and is normal in caliber.  SVG graft was not visualized.  Origin to Prox Graft  lesion is 100% stenosed.  SVG graft was not visualized.  Origin to Prox Graft lesion is 100% stenosed.  SVG graft was not visualized.  Origin to Prox Graft lesion is 100% stenosed.  1. Severe left main stenosis. The left main supplies the Circumflex only as the LAD is occluded. 2. Chronic occlusion of the proximal LAD. The mid and distal LAD fills from the patent LIMA graft 3. Chronic occlusion of intermediate branch. Chronic occlusion of the SVG to intermediate branch 4. Chronic occlusion SVG to OM 5. Chronic occlusion mid RCA. The RCA fills from left to right collaterals supplied by the LAD through the LIMA. The SVG to the RCA is chronically occluded. Recommendations: He has had progression of his left main disease. This is felt to be his culprit vessel. His left main supplies  the Circumflex artery. The LAD is chronically occluded and fills from the patent LIMA graft. The RCA fills from left to right collaterals. I think he will need PCI of the left main lesion which is essentially a proximal Circumflex stenosis since the LAD is occluded and protected by the graft. Given his CKD, this was a diagnostic only cath. Will hydrate over the weekend. Given elevated LVEDP, will gently diurese. Continue ASA and Plavix. Resume IV heparin 6 hours post sheath pull. Will plan PCI/stenting of the left main artery on Monday. Will review films with the IC team. I do not think atherectomy is indicated prior to stenting.   ECHOCARDIOGRAM COMPLETE  Result Date: 11/20/2019    ECHOCARDIOGRAM REPORT   Patient Name:   DAYMEON MALARKEY Date of Exam: 11/20/2019 Medical Rec #:  [id number]       Height:       72.0 in Accession #:    [id number]      Weight:       285.0 lb Date of Birth:  [birth year]       BSA:          2.477 m Patient Age:    74 years        BP:           133/79 mmHg Patient Gender: M               HR:           63 bpm. Exam Location:  Inpatient Procedure: 2D Echo, Cardiac Doppler, Color Doppler and Intracardiac             Opacification Agent Indications:    Dyspnea 786.09  History:        Patient has prior history of Echocardiogram examinations, most                 recent 09/09/2014. Cardiomyopathy, CAD, Prior CABG, Stroke and                 TIA, Signs/Symptoms:Chest Pain; Risk Factors:Diabetes and Former                 Smoker. Eleavted troponin.  Sonographer:    Vickie Epley RDCS Referring Phys: [place]  1. Left ventricular ejection fraction, by estimation, is 25 to 30%. The left ventricle has severely decreased function. The left ventricle demonstrates regional wall motion abnormalities (see scoring diagram/findings for description). Left ventricular diastolic function could not be evaluated.  2. Right ventricular systolic function is mildly reduced. The right ventricular size is mildly enlarged. There is mildly elevated pulmonary artery systolic pressure. The estimated right ventricular systolic pressure is 123XX123 mmHg.  3. The mitral valve is grossly normal. Mild mitral valve regurgitation. No evidence of mitral stenosis.  4. Tricuspid valve regurgitation is mild to moderate.  5. The aortic valve is tricuspid. Aortic valve regurgitation is not visualized. No aortic stenosis is present.  6. Difficult study with poor windows despite contrast. LVEF severely reduced 25-30%. Mid septum into apex is akinetic consistent with prior infarction. No LV thrombus on contrast imaging. FINDINGS  Left Ventricle: Left ventricular ejection fraction, by estimation, is 25 to 30%. The left ventricle has severely decreased function. The left ventricle demonstrates regional wall motion abnormalities. Definity contrast agent was given IV to delineate the left ventricular endocardial borders. The left ventricular internal cavity size was normal in size. There is no left ventricular hypertrophy. Left ventricular diastolic function could not be evaluated due to  paced rhythm. Left ventricular diastolic function could not be evaluated.   LV Wall Scoring: The mid and distal anterior septum, apical anterior segment, and apex are akinetic. Right Ventricle: The right ventricular size is mildly enlarged. No increase in right ventricular wall thickness. Right ventricular systolic function is mildly reduced. There is mildly elevated pulmonary artery systolic pressure. The tricuspid regurgitant  velocity is 3.00 m/s, and with an assumed right atrial pressure of 8 mmHg, the estimated right ventricular systolic pressure is 123XX123 mmHg. Left Atrium: Left atrial size was normal in size. Right Atrium: Right atrial size was normal in size. Pericardium: There is no evidence of pericardial effusion. Presence of pericardial fat pad. Mitral Valve: The mitral valve is grossly normal. Mild mitral valve regurgitation. No evidence of mitral valve stenosis. Tricuspid Valve: The tricuspid valve is grossly normal. Tricuspid valve regurgitation is mild to moderate. Aortic Valve: The aortic valve is tricuspid. Aortic valve regurgitation is not visualized. No aortic stenosis is present. Pulmonic Valve: The pulmonic valve was grossly normal. Pulmonic valve regurgitation is not visualized. No evidence of pulmonic stenosis. Aorta: The aortic root is normal in size and structure. IAS/Shunts: No atrial level shunt detected by color flow Doppler. Additional Comments: A pacer wire is visualized in the right atrium and right ventricle.  LEFT VENTRICLE PLAX 2D LVIDd:         5.79 cm  Diastology LVIDs:         4.69 cm  LV e' lateral:   6.30 cm/s LV PW:         1.00 cm  LV E/e' lateral: 11.2 LV IVS:        0.98 cm  LV e' medial:    4.65 cm/s LVOT diam:     2.10 cm  LV E/e' medial:  15.1 LV SV:         51 LV SV Index:   20 LVOT Area:     3.46 cm  RIGHT VENTRICLE RV S prime:     4.61 cm/s TAPSE (M-mode): 1.4 cm LEFT ATRIUM             Index       RIGHT ATRIUM           Index LA diam:        4.50 cm 1.82 cm/m  RA Area:     18.80 cm LA Vol (A2C):   43.3 ml 17.48 ml/m RA Volume:   48.90 ml   19.74 ml/m LA Vol (A4C):   48.7 ml 19.66 ml/m LA Biplane Vol: 46.6 ml 18.82 ml/m  AORTIC VALVE LVOT Vmax:   71.30 cm/s LVOT Vmean:  42.600 cm/s LVOT VTI:    0.146 m  AORTA Ao Root diam: 3.40 cm MITRAL VALVE               TRICUSPID VALVE MV Area (PHT): 4.60 cm    TR Peak grad:   36.0 mmHg MV Decel Time: 165 msec    TR Vmax:        300.00 cm/s MR Peak grad: 91.8 mmHg MR Vmax:      479.00 cm/s  SHUNTS MV E velocity: 70.30 cm/s  Systemic VTI:  0.15 m MV A velocity: 47.60 cm/s  Systemic Diam: 2.10 cm MV E/A ratio:  1.48 Eleonore Chiquito MD Electronically signed by Eleonore Chiquito MD Signature Date/Time: 11/20/2019/3:14:37 PM    Final       The results of significant diagnostics from this hospitalization (including imaging, microbiology, ancillary and laboratory)  are listed below for reference.     Microbiology: Recent Results (from the past 240 hour(s))  Respiratory Panel by RT PCR (Flu A&B, Covid) - Nasopharyngeal Swab     Status: None   Collection Time: 11/19/19 11:45 PM   Specimen: Nasopharyngeal Swab  Result Value Ref Range Status   SARS Coronavirus 2 by RT PCR NEGATIVE NEGATIVE Final    Comment: (NOTE) SARS-CoV-2 target nucleic acids are NOT DETECTED. The SARS-CoV-2 RNA is generally detectable in upper respiratoy specimens during the acute phase of infection. The lowest concentration of SARS-CoV-2 viral copies this assay can detect is 131 copies/mL. A negative result does not preclude SARS-Cov-2 infection and should not be used as the sole basis for treatment or other patient management decisions. A negative result may occur with  improper specimen collection/handling, submission of specimen other than nasopharyngeal swab, presence of viral mutation(s) within the areas targeted by this assay, and inadequate number of viral copies (<131 copies/mL). A negative result must be combined with clinical observations, patient history, and epidemiological information. The expected result is  Negative. Fact Sheet for Patients:  PinkCheek.be Fact Sheet for Healthcare Providers:  GravelBags.it This test is not yet ap proved or cleared by the [place] FDA and  has been authorized for detection and/or diagnosis of SARS-CoV-2 by FDA under an Emergency Use Authorization (EUA). This EUA will remain  in effect (meaning this test can be used) for the duration of the COVID-19 declaration under Section 564(b)(1) of the Act, 21 U.S.C. section 360bbb-3(b)(1), unless the authorization is terminated or revoked sooner.    Influenza A by PCR NEGATIVE NEGATIVE Final   Influenza B by PCR NEGATIVE NEGATIVE Final    Comment: (NOTE) The Xpert Xpress SARS-CoV-2/FLU/RSV assay is intended as an aid in  the diagnosis of influenza from Nasopharyngeal swab specimens and  should not be used as a sole basis for treatment. Nasal washings and  aspirates are unacceptable for Xpert Xpress SARS-CoV-2/FLU/RSV  testing. Fact Sheet for Patients: PinkCheek.be Fact Sheet for Healthcare Providers: GravelBags.it This test is not yet approved or cleared by the [place] FDA and  has been authorized for detection and/or diagnosis of SARS-CoV-2 by  FDA under an Emergency Use Authorization (EUA). This EUA will remain  in effect (meaning this test can be used) for the duration of the  Covid-19 declaration under Section 564(b)(1) of the Act, 21  U.S.C. section 360bbb-3(b)(1), unless the authorization is  terminated or revoked. Performed at [facility], [place] [address], [place], [place] [address]   Surgical PCR screen     Status: None   Collection Time: 11/21/19  2:39 AM   Specimen: Nasal Mucosa; Nasal Swab  Result Value Ref Range Status   MRSA, PCR NEGATIVE NEGATIVE Final   Staphylococcus aureus NEGATIVE NEGATIVE Final    Comment: (NOTE) The Xpert SA Assay (FDA approved for NASAL  specimens in patients 58 years of age and older), is one component of a comprehensive surveillance program. It is not intended to diagnose infection nor to guide or monitor treatment. Performed at [facility], [place] [address] [place], [place] [address]      Labs: BNP (last 3 results) No results for input(s): BNP in the last 8760 hours. Basic Metabolic Panel: Recent Labs  Lab 11/22/19 0343 11/23/19 0403 11/24/19 0517 11/25/19 0850 11/26/19 0400  NA 136 137 137 138 139  K 4.6 4.7 4.5 4.5 4.3  CL 101 100 102 105 103  CO2 23  25 25 22 26   GLUCOSE 265* 262* 292* 238* 246*  BUN 47* 54* 65* 58* 61*  CREATININE 1.77* 1.85* 1.98* 1.71* 1.83*  CALCIUM 8.6* 8.8* 8.6* 8.6* 8.9   Liver Function Tests: Recent Labs  Lab 11/20/19 0336  AST 27  ALT 21  ALKPHOS 77  BILITOT 0.9  PROT 5.8*  ALBUMIN 3.5   No results for input(s): LIPASE, AMYLASE in the last 168 hours. No results for input(s): AMMONIA in the last 168 hours. CBC: Recent Labs  Lab 11/20/19 0336 11/21/19 0329 11/22/19 0343 11/23/19 0403 11/24/19 0517 11/25/19 0850 11/26/19 0400  WBC 6.7   < > 8.4 8.3 6.2 6.6 5.8  NEUTROABS 4.6  --   --   --   --   --   --   HGB 12.6*   < > 12.4* 11.7* 11.3* 11.8* 11.8*  HCT 37.8*   < > 36.8* 35.6* 34.1* 35.6* 35.3*  MCV 99.2   < > 100.5* 100.0 100.3* 99.4 100.6*  PLT 76*   < > 62* 67* 69* 89* 96*   < > = values in this interval not displayed.   Cardiac Enzymes: No results for input(s): CKTOTAL, CKMB, CKMBINDEX, TROPONINI in the last 168 hours. BNP: Invalid input(s): POCBNP CBG: Recent Labs  Lab 11/25/19 1132 11/25/19 1659 11/25/19 2128 11/26/19 0817 11/26/19 1238  GLUCAP 285* 231* 176* 230* 282*   D-Dimer No results for input(s): DDIMER in the last 72 hours. Hgb A1c No results for input(s): HGBA1C in the last 72 hours. Lipid Profile No results for input(s): CHOL, HDL, LDLCALC, TRIG, CHOLHDL, LDLDIRECT in the last 72 hours. Thyroid function studies No  results for input(s): TSH, T4TOTAL, T3FREE, THYROIDAB in the last 72 hours.  Invalid input(s): FREET3 Anemia work up No results for input(s): VITAMINB12, FOLATE, FERRITIN, TIBC, IRON, RETICCTPCT in the last 72 hours. Urinalysis    Component Value Date/Time   COLORURINE YELLOW 02/01/2017 1104   APPEARANCEUR CLEAR 02/01/2017 1104   LABSPEC 1.017 02/01/2017 1104   PHURINE 6.0 02/01/2017 1104   GLUCOSEU 150 (A) 02/01/2017 1104   HGBUR NEGATIVE 02/01/2017 1104   BILIRUBINUR NEGATIVE 02/01/2017 1104   KETONESUR NEGATIVE 02/01/2017 1104   PROTEINUR >=300 (A) 02/01/2017 1104   UROBILINOGEN 0.2 04/02/2015 1341   NITRITE NEGATIVE 02/01/2017 1104   LEUKOCYTESUR NEGATIVE 02/01/2017 1104   Sepsis Labs Invalid input(s): PROCALCITONIN,  WBC,  LACTICIDVEN Microbiology Recent Results (from the past 240 hour(s))  Respiratory Panel by RT PCR (Flu A&B, Covid) - Nasopharyngeal Swab     Status: None   Collection Time: 11/19/19 11:45 PM   Specimen: Nasopharyngeal Swab  Result Value Ref Range Status   SARS Coronavirus 2 by RT PCR NEGATIVE NEGATIVE Final    Comment: (NOTE) SARS-CoV-2 target nucleic acids are NOT DETECTED. The SARS-CoV-2 RNA is generally detectable in upper respiratoy specimens during the acute phase of infection. The lowest concentration of SARS-CoV-2 viral copies this assay can detect is 131 copies/mL. A negative result does not preclude SARS-Cov-2 infection and should not be used as the sole basis for treatment or other patient management decisions. A negative result may occur with  improper specimen collection/handling, submission of specimen other than nasopharyngeal swab, presence of viral mutation(s) within the areas targeted by this assay, and inadequate number of viral copies (<131 copies/mL). A negative result must be combined with clinical observations, patient history, and epidemiological information. The expected result is Negative. Fact Sheet for Patients:   PinkCheek.be Fact Sheet for Healthcare Providers:  GravelBags.it This test is not yet ap proved or cleared by the [place] and  has been authorized for detection and/or diagnosis of SARS-CoV-2 by FDA under an Emergency Use Authorization (EUA). This EUA will remain  in effect (meaning this test can be used) for the duration of the COVID-19 declaration under Section 564(b)(1) of the Act, 21 U.S.C. section 360bbb-3(b)(1), unless the authorization is terminated or revoked sooner.    Influenza A by PCR NEGATIVE NEGATIVE Final   Influenza B by PCR NEGATIVE NEGATIVE Final    Comment: (NOTE) The Xpert Xpress SARS-CoV-2/FLU/RSV assay is intended as an aid in  the diagnosis of influenza from Nasopharyngeal swab specimens and  should not be used as a sole basis for treatment. Nasal washings and  aspirates are unacceptable for Xpert Xpress SARS-CoV-2/FLU/RSV  testing. Fact Sheet for Patients: PinkCheek.be Fact Sheet for Healthcare Providers: GravelBags.it This test is not yet approved or cleared by the [place] FDA and  has been authorized for detection and/or diagnosis of SARS-CoV-2 by  FDA under an Emergency Use Authorization (EUA). This EUA will remain  in effect (meaning this test can be used) for the duration of the  Covid-19 declaration under Section 564(b)(1) of the Act, 21  U.S.C. section 360bbb-3(b)(1), unless the authorization is  terminated or revoked. Performed at [facility], [place] [address] [place], [place] [address]   Surgical PCR screen     Status: None   Collection Time: 11/21/19  2:39 AM   Specimen: Nasal Mucosa; Nasal Swab  Result Value Ref Range Status   MRSA, PCR NEGATIVE NEGATIVE Final   Staphylococcus aureus NEGATIVE NEGATIVE Final    Comment: (NOTE) The Xpert SA Assay (FDA approved for NASAL specimens in patients 55 years of age  and older), is one component of a comprehensive surveillance program. It is not intended to diagnose infection nor to guide or monitor treatment. Performed at [facility], [place] [address] [place], [place] [address]      Time coordinating discharge:  I have spent 35 minutes face to face with the patient and on the ward discussing the patients care, assessment, plan and disposition with other care givers. >50% of the time was devoted counseling the patient about the risks and benefits of treatment/Discharge disposition and coordinating care.   SIGNED:   Damita Lack, MD  [facility] 11/26/2019, 1:44 PM   If 7PM-7AM, please contact night-coverage

## 2019-11-28 DIAGNOSIS — I252 Old myocardial infarction: Secondary | ICD-10-CM | POA: Diagnosis not present

## 2019-11-28 DIAGNOSIS — I255 Ischemic cardiomyopathy: Secondary | ICD-10-CM | POA: Diagnosis not present

## 2019-11-28 DIAGNOSIS — I25110 Atherosclerotic heart disease of native coronary artery with unstable angina pectoris: Secondary | ICD-10-CM | POA: Diagnosis not present

## 2019-11-28 DIAGNOSIS — Z48812 Encounter for surgical aftercare following surgery on the circulatory system: Secondary | ICD-10-CM | POA: Diagnosis not present

## 2019-11-28 DIAGNOSIS — I214 Non-ST elevation (NSTEMI) myocardial infarction: Secondary | ICD-10-CM | POA: Diagnosis not present

## 2019-12-01 DIAGNOSIS — I25110 Atherosclerotic heart disease of native coronary artery with unstable angina pectoris: Secondary | ICD-10-CM | POA: Diagnosis not present

## 2019-12-01 DIAGNOSIS — I255 Ischemic cardiomyopathy: Secondary | ICD-10-CM | POA: Diagnosis not present

## 2019-12-01 DIAGNOSIS — I214 Non-ST elevation (NSTEMI) myocardial infarction: Secondary | ICD-10-CM | POA: Diagnosis not present

## 2019-12-01 DIAGNOSIS — I252 Old myocardial infarction: Secondary | ICD-10-CM | POA: Diagnosis not present

## 2019-12-01 DIAGNOSIS — Z48812 Encounter for surgical aftercare following surgery on the circulatory system: Secondary | ICD-10-CM | POA: Diagnosis not present

## 2019-12-01 DIAGNOSIS — I2510 Atherosclerotic heart disease of native coronary artery without angina pectoris: Secondary | ICD-10-CM | POA: Diagnosis not present

## 2019-12-02 DIAGNOSIS — I255 Ischemic cardiomyopathy: Secondary | ICD-10-CM | POA: Diagnosis not present

## 2019-12-02 DIAGNOSIS — I252 Old myocardial infarction: Secondary | ICD-10-CM | POA: Diagnosis not present

## 2019-12-02 DIAGNOSIS — Z48812 Encounter for surgical aftercare following surgery on the circulatory system: Secondary | ICD-10-CM | POA: Diagnosis not present

## 2019-12-02 DIAGNOSIS — I25110 Atherosclerotic heart disease of native coronary artery with unstable angina pectoris: Secondary | ICD-10-CM | POA: Diagnosis not present

## 2019-12-02 DIAGNOSIS — I214 Non-ST elevation (NSTEMI) myocardial infarction: Secondary | ICD-10-CM | POA: Diagnosis not present

## 2019-12-04 DIAGNOSIS — I214 Non-ST elevation (NSTEMI) myocardial infarction: Secondary | ICD-10-CM | POA: Diagnosis not present

## 2019-12-04 DIAGNOSIS — I25110 Atherosclerotic heart disease of native coronary artery with unstable angina pectoris: Secondary | ICD-10-CM | POA: Diagnosis not present

## 2019-12-04 DIAGNOSIS — I255 Ischemic cardiomyopathy: Secondary | ICD-10-CM | POA: Diagnosis not present

## 2019-12-04 DIAGNOSIS — Z48812 Encounter for surgical aftercare following surgery on the circulatory system: Secondary | ICD-10-CM | POA: Diagnosis not present

## 2019-12-04 DIAGNOSIS — I252 Old myocardial infarction: Secondary | ICD-10-CM | POA: Diagnosis not present

## 2019-12-05 DIAGNOSIS — I214 Non-ST elevation (NSTEMI) myocardial infarction: Secondary | ICD-10-CM | POA: Diagnosis not present

## 2019-12-05 DIAGNOSIS — I255 Ischemic cardiomyopathy: Secondary | ICD-10-CM | POA: Diagnosis not present

## 2019-12-05 DIAGNOSIS — I252 Old myocardial infarction: Secondary | ICD-10-CM | POA: Diagnosis not present

## 2019-12-05 DIAGNOSIS — I25110 Atherosclerotic heart disease of native coronary artery with unstable angina pectoris: Secondary | ICD-10-CM | POA: Diagnosis not present

## 2019-12-05 DIAGNOSIS — Z48812 Encounter for surgical aftercare following surgery on the circulatory system: Secondary | ICD-10-CM | POA: Diagnosis not present

## 2019-12-09 DIAGNOSIS — I252 Old myocardial infarction: Secondary | ICD-10-CM | POA: Diagnosis not present

## 2019-12-09 DIAGNOSIS — Z48812 Encounter for surgical aftercare following surgery on the circulatory system: Secondary | ICD-10-CM | POA: Diagnosis not present

## 2019-12-09 DIAGNOSIS — I255 Ischemic cardiomyopathy: Secondary | ICD-10-CM | POA: Diagnosis not present

## 2019-12-09 DIAGNOSIS — I214 Non-ST elevation (NSTEMI) myocardial infarction: Secondary | ICD-10-CM | POA: Diagnosis not present

## 2019-12-09 DIAGNOSIS — I25110 Atherosclerotic heart disease of native coronary artery with unstable angina pectoris: Secondary | ICD-10-CM | POA: Diagnosis not present

## 2019-12-11 DIAGNOSIS — I25110 Atherosclerotic heart disease of native coronary artery with unstable angina pectoris: Secondary | ICD-10-CM | POA: Diagnosis not present

## 2019-12-11 DIAGNOSIS — I252 Old myocardial infarction: Secondary | ICD-10-CM | POA: Diagnosis not present

## 2019-12-11 DIAGNOSIS — Z48812 Encounter for surgical aftercare following surgery on the circulatory system: Secondary | ICD-10-CM | POA: Diagnosis not present

## 2019-12-11 DIAGNOSIS — I214 Non-ST elevation (NSTEMI) myocardial infarction: Secondary | ICD-10-CM | POA: Diagnosis not present

## 2019-12-11 DIAGNOSIS — I255 Ischemic cardiomyopathy: Secondary | ICD-10-CM | POA: Diagnosis not present

## 2019-12-12 DIAGNOSIS — I214 Non-ST elevation (NSTEMI) myocardial infarction: Secondary | ICD-10-CM | POA: Diagnosis not present

## 2019-12-12 DIAGNOSIS — I255 Ischemic cardiomyopathy: Secondary | ICD-10-CM | POA: Diagnosis not present

## 2019-12-12 DIAGNOSIS — I25110 Atherosclerotic heart disease of native coronary artery with unstable angina pectoris: Secondary | ICD-10-CM | POA: Diagnosis not present

## 2019-12-12 DIAGNOSIS — I252 Old myocardial infarction: Secondary | ICD-10-CM | POA: Diagnosis not present

## 2019-12-12 DIAGNOSIS — Z48812 Encounter for surgical aftercare following surgery on the circulatory system: Secondary | ICD-10-CM | POA: Diagnosis not present

## 2019-12-15 DIAGNOSIS — I252 Old myocardial infarction: Secondary | ICD-10-CM | POA: Diagnosis not present

## 2019-12-15 DIAGNOSIS — I214 Non-ST elevation (NSTEMI) myocardial infarction: Secondary | ICD-10-CM | POA: Diagnosis not present

## 2019-12-15 DIAGNOSIS — I255 Ischemic cardiomyopathy: Secondary | ICD-10-CM | POA: Diagnosis not present

## 2019-12-15 DIAGNOSIS — Z48812 Encounter for surgical aftercare following surgery on the circulatory system: Secondary | ICD-10-CM | POA: Diagnosis not present

## 2019-12-15 DIAGNOSIS — I25110 Atherosclerotic heart disease of native coronary artery with unstable angina pectoris: Secondary | ICD-10-CM | POA: Diagnosis not present

## 2019-12-19 DIAGNOSIS — I252 Old myocardial infarction: Secondary | ICD-10-CM | POA: Diagnosis not present

## 2019-12-19 DIAGNOSIS — Z48812 Encounter for surgical aftercare following surgery on the circulatory system: Secondary | ICD-10-CM | POA: Diagnosis not present

## 2019-12-19 DIAGNOSIS — I255 Ischemic cardiomyopathy: Secondary | ICD-10-CM | POA: Diagnosis not present

## 2019-12-19 DIAGNOSIS — I25110 Atherosclerotic heart disease of native coronary artery with unstable angina pectoris: Secondary | ICD-10-CM | POA: Diagnosis not present

## 2019-12-19 DIAGNOSIS — I214 Non-ST elevation (NSTEMI) myocardial infarction: Secondary | ICD-10-CM | POA: Diagnosis not present

## 2019-12-23 DIAGNOSIS — I252 Old myocardial infarction: Secondary | ICD-10-CM | POA: Diagnosis not present

## 2019-12-23 DIAGNOSIS — I214 Non-ST elevation (NSTEMI) myocardial infarction: Secondary | ICD-10-CM | POA: Diagnosis not present

## 2019-12-23 DIAGNOSIS — I25110 Atherosclerotic heart disease of native coronary artery with unstable angina pectoris: Secondary | ICD-10-CM | POA: Diagnosis not present

## 2019-12-23 DIAGNOSIS — Z48812 Encounter for surgical aftercare following surgery on the circulatory system: Secondary | ICD-10-CM | POA: Diagnosis not present

## 2019-12-23 DIAGNOSIS — I255 Ischemic cardiomyopathy: Secondary | ICD-10-CM | POA: Diagnosis not present

## 2019-12-26 DIAGNOSIS — Z48812 Encounter for surgical aftercare following surgery on the circulatory system: Secondary | ICD-10-CM | POA: Diagnosis not present

## 2019-12-26 DIAGNOSIS — I25110 Atherosclerotic heart disease of native coronary artery with unstable angina pectoris: Secondary | ICD-10-CM | POA: Diagnosis not present

## 2019-12-26 DIAGNOSIS — I252 Old myocardial infarction: Secondary | ICD-10-CM | POA: Diagnosis not present

## 2019-12-26 DIAGNOSIS — I255 Ischemic cardiomyopathy: Secondary | ICD-10-CM | POA: Diagnosis not present

## 2019-12-26 DIAGNOSIS — I214 Non-ST elevation (NSTEMI) myocardial infarction: Secondary | ICD-10-CM | POA: Diagnosis not present

## 2019-12-29 DIAGNOSIS — I214 Non-ST elevation (NSTEMI) myocardial infarction: Secondary | ICD-10-CM | POA: Diagnosis not present

## 2019-12-29 DIAGNOSIS — I255 Ischemic cardiomyopathy: Secondary | ICD-10-CM | POA: Diagnosis not present

## 2019-12-29 DIAGNOSIS — Z48812 Encounter for surgical aftercare following surgery on the circulatory system: Secondary | ICD-10-CM | POA: Diagnosis not present

## 2019-12-29 DIAGNOSIS — I25110 Atherosclerotic heart disease of native coronary artery with unstable angina pectoris: Secondary | ICD-10-CM | POA: Diagnosis not present

## 2019-12-29 DIAGNOSIS — I252 Old myocardial infarction: Secondary | ICD-10-CM | POA: Diagnosis not present

## 2020-01-01 DIAGNOSIS — Z48812 Encounter for surgical aftercare following surgery on the circulatory system: Secondary | ICD-10-CM | POA: Diagnosis not present

## 2020-01-01 DIAGNOSIS — I214 Non-ST elevation (NSTEMI) myocardial infarction: Secondary | ICD-10-CM | POA: Diagnosis not present

## 2020-01-01 DIAGNOSIS — I25110 Atherosclerotic heart disease of native coronary artery with unstable angina pectoris: Secondary | ICD-10-CM | POA: Diagnosis not present

## 2020-01-01 DIAGNOSIS — I252 Old myocardial infarction: Secondary | ICD-10-CM | POA: Diagnosis not present

## 2020-01-01 DIAGNOSIS — I255 Ischemic cardiomyopathy: Secondary | ICD-10-CM | POA: Diagnosis not present

## 2020-01-06 DIAGNOSIS — I214 Non-ST elevation (NSTEMI) myocardial infarction: Secondary | ICD-10-CM | POA: Diagnosis not present

## 2020-01-06 DIAGNOSIS — I252 Old myocardial infarction: Secondary | ICD-10-CM | POA: Diagnosis not present

## 2020-01-06 DIAGNOSIS — I25110 Atherosclerotic heart disease of native coronary artery with unstable angina pectoris: Secondary | ICD-10-CM | POA: Diagnosis not present

## 2020-01-06 DIAGNOSIS — I255 Ischemic cardiomyopathy: Secondary | ICD-10-CM | POA: Diagnosis not present

## 2020-01-06 DIAGNOSIS — Z48812 Encounter for surgical aftercare following surgery on the circulatory system: Secondary | ICD-10-CM | POA: Diagnosis not present

## 2020-05-05 ENCOUNTER — Other Ambulatory Visit: Payer: Self-pay

## 2020-05-05 ENCOUNTER — Emergency Department (HOSPITAL_COMMUNITY): Payer: No Typology Code available for payment source

## 2020-05-05 ENCOUNTER — Encounter (HOSPITAL_COMMUNITY): Payer: Self-pay | Admitting: Emergency Medicine

## 2020-05-05 ENCOUNTER — Inpatient Hospital Stay (HOSPITAL_COMMUNITY)
Admission: EM | Admit: 2020-05-05 | Discharge: 2020-05-10 | DRG: 683 | Disposition: A | Discharge status: Home / Self Care | Payer: No Typology Code available for payment source | Attending: Family Medicine | Admitting: Family Medicine

## 2020-05-05 DIAGNOSIS — E869 Volume depletion, unspecified: Secondary | ICD-10-CM

## 2020-05-05 DIAGNOSIS — Z951 Presence of aortocoronary bypass graft: Secondary | ICD-10-CM

## 2020-05-05 DIAGNOSIS — E872 Acidosis: Secondary | ICD-10-CM | POA: Diagnosis present

## 2020-05-05 DIAGNOSIS — K635 Polyp of colon: Secondary | ICD-10-CM | POA: Diagnosis present

## 2020-05-05 DIAGNOSIS — D128 Benign neoplasm of rectum: Secondary | ICD-10-CM | POA: Diagnosis not present

## 2020-05-05 DIAGNOSIS — F4310 Post-traumatic stress disorder, unspecified: Secondary | ICD-10-CM | POA: Diagnosis present

## 2020-05-05 DIAGNOSIS — D123 Benign neoplasm of transverse colon: Secondary | ICD-10-CM | POA: Diagnosis not present

## 2020-05-05 DIAGNOSIS — E7800 Pure hypercholesterolemia, unspecified: Secondary | ICD-10-CM | POA: Diagnosis present

## 2020-05-05 DIAGNOSIS — E875 Hyperkalemia: Secondary | ICD-10-CM | POA: Diagnosis not present

## 2020-05-05 DIAGNOSIS — N179 Acute kidney failure, unspecified: Secondary | ICD-10-CM | POA: Diagnosis present

## 2020-05-05 DIAGNOSIS — N281 Cyst of kidney, acquired: Secondary | ICD-10-CM | POA: Diagnosis present

## 2020-05-05 DIAGNOSIS — I10 Essential (primary) hypertension: Secondary | ICD-10-CM | POA: Diagnosis not present

## 2020-05-05 DIAGNOSIS — I252 Old myocardial infarction: Secondary | ICD-10-CM | POA: Diagnosis not present

## 2020-05-05 DIAGNOSIS — N1832 Chronic kidney disease, stage 3b: Secondary | ICD-10-CM | POA: Diagnosis present

## 2020-05-05 DIAGNOSIS — D122 Benign neoplasm of ascending colon: Secondary | ICD-10-CM | POA: Diagnosis not present

## 2020-05-05 DIAGNOSIS — I130 Hypertensive heart and chronic kidney disease with heart failure and stage 1 through stage 4 chronic kidney disease, or unspecified chronic kidney disease: Secondary | ICD-10-CM | POA: Diagnosis present

## 2020-05-05 DIAGNOSIS — I5022 Chronic systolic (congestive) heart failure: Secondary | ICD-10-CM | POA: Diagnosis present

## 2020-05-05 DIAGNOSIS — R197 Diarrhea, unspecified: Secondary | ICD-10-CM | POA: Diagnosis not present

## 2020-05-05 DIAGNOSIS — Z66 Do not resuscitate: Secondary | ICD-10-CM | POA: Diagnosis present

## 2020-05-05 DIAGNOSIS — F419 Anxiety disorder, unspecified: Secondary | ICD-10-CM | POA: Diagnosis present

## 2020-05-05 DIAGNOSIS — K529 Noninfective gastroenteritis and colitis, unspecified: Secondary | ICD-10-CM | POA: Diagnosis present

## 2020-05-05 DIAGNOSIS — K219 Gastro-esophageal reflux disease without esophagitis: Secondary | ICD-10-CM | POA: Diagnosis present

## 2020-05-05 DIAGNOSIS — E669 Obesity, unspecified: Secondary | ICD-10-CM | POA: Diagnosis present

## 2020-05-05 DIAGNOSIS — D649 Anemia, unspecified: Secondary | ICD-10-CM | POA: Diagnosis not present

## 2020-05-05 DIAGNOSIS — D631 Anemia in chronic kidney disease: Secondary | ICD-10-CM | POA: Diagnosis present

## 2020-05-05 DIAGNOSIS — E118 Type 2 diabetes mellitus with unspecified complications: Secondary | ICD-10-CM | POA: Diagnosis not present

## 2020-05-05 DIAGNOSIS — Z794 Long term (current) use of insulin: Secondary | ICD-10-CM

## 2020-05-05 DIAGNOSIS — I480 Paroxysmal atrial fibrillation: Secondary | ICD-10-CM | POA: Diagnosis present

## 2020-05-05 DIAGNOSIS — I255 Ischemic cardiomyopathy: Secondary | ICD-10-CM | POA: Diagnosis present

## 2020-05-05 DIAGNOSIS — Z20822 Contact with and (suspected) exposure to covid-19: Secondary | ICD-10-CM | POA: Diagnosis present

## 2020-05-05 DIAGNOSIS — Z7189 Other specified counseling: Secondary | ICD-10-CM | POA: Diagnosis not present

## 2020-05-05 DIAGNOSIS — E1122 Type 2 diabetes mellitus with diabetic chronic kidney disease: Secondary | ICD-10-CM | POA: Diagnosis present

## 2020-05-05 DIAGNOSIS — Z87891 Personal history of nicotine dependence: Secondary | ICD-10-CM

## 2020-05-05 DIAGNOSIS — K5730 Diverticulosis of large intestine without perforation or abscess without bleeding: Secondary | ICD-10-CM | POA: Diagnosis present

## 2020-05-05 DIAGNOSIS — K621 Rectal polyp: Secondary | ICD-10-CM | POA: Diagnosis present

## 2020-05-05 DIAGNOSIS — F329 Major depressive disorder, single episode, unspecified: Secondary | ICD-10-CM | POA: Diagnosis present

## 2020-05-05 DIAGNOSIS — D124 Benign neoplasm of descending colon: Secondary | ICD-10-CM | POA: Diagnosis not present

## 2020-05-05 DIAGNOSIS — I959 Hypotension, unspecified: Secondary | ICD-10-CM | POA: Diagnosis present

## 2020-05-05 DIAGNOSIS — N183 Chronic kidney disease, stage 3 unspecified: Secondary | ICD-10-CM | POA: Diagnosis present

## 2020-05-05 DIAGNOSIS — Z8673 Personal history of transient ischemic attack (TIA), and cerebral infarction without residual deficits: Secondary | ICD-10-CM

## 2020-05-05 DIAGNOSIS — I2510 Atherosclerotic heart disease of native coronary artery without angina pectoris: Secondary | ICD-10-CM | POA: Diagnosis present

## 2020-05-05 DIAGNOSIS — Z955 Presence of coronary angioplasty implant and graft: Secondary | ICD-10-CM

## 2020-05-05 DIAGNOSIS — G4733 Obstructive sleep apnea (adult) (pediatric): Secondary | ICD-10-CM | POA: Diagnosis present

## 2020-05-05 DIAGNOSIS — K640 First degree hemorrhoids: Secondary | ICD-10-CM | POA: Diagnosis present

## 2020-05-05 DIAGNOSIS — E861 Hypovolemia: Secondary | ICD-10-CM | POA: Diagnosis present

## 2020-05-05 DIAGNOSIS — R159 Full incontinence of feces: Secondary | ICD-10-CM | POA: Diagnosis present

## 2020-05-05 DIAGNOSIS — R531 Weakness: Secondary | ICD-10-CM

## 2020-05-05 DIAGNOSIS — Z95810 Presence of automatic (implantable) cardiac defibrillator: Secondary | ICD-10-CM

## 2020-05-05 DIAGNOSIS — Z7902 Long term (current) use of antithrombotics/antiplatelets: Secondary | ICD-10-CM

## 2020-05-05 DIAGNOSIS — F0150 Vascular dementia without behavioral disturbance: Secondary | ICD-10-CM | POA: Diagnosis present

## 2020-05-05 DIAGNOSIS — E1165 Type 2 diabetes mellitus with hyperglycemia: Secondary | ICD-10-CM | POA: Diagnosis present

## 2020-05-05 DIAGNOSIS — Z888 Allergy status to other drugs, medicaments and biological substances status: Secondary | ICD-10-CM

## 2020-05-05 DIAGNOSIS — Z515 Encounter for palliative care: Secondary | ICD-10-CM | POA: Diagnosis not present

## 2020-05-05 DIAGNOSIS — Z7901 Long term (current) use of anticoagulants: Secondary | ICD-10-CM | POA: Diagnosis not present

## 2020-05-05 DIAGNOSIS — Z79899 Other long term (current) drug therapy: Secondary | ICD-10-CM

## 2020-05-05 DIAGNOSIS — E860 Dehydration: Secondary | ICD-10-CM | POA: Diagnosis present

## 2020-05-05 LAB — CBC WITH DIFFERENTIAL/PLATELET
Abs Immature Granulocytes: 0.04 10*3/uL (ref 0.00–0.07)
Basophils Absolute: 0 10*3/uL (ref 0.0–0.1)
Basophils Relative: 0 %
Eosinophils Absolute: 0.2 10*3/uL (ref 0.0–0.5)
Eosinophils Relative: 2 %
HCT: 35.7 % — ABNORMAL LOW (ref 39.0–52.0)
Hemoglobin: 12.1 g/dL — ABNORMAL LOW (ref 13.0–17.0)
Immature Granulocytes: 0 %
Lymphocytes Relative: 15 %
Lymphs Abs: 1.4 10*3/uL (ref 0.7–4.0)
MCH: 33.6 pg (ref 26.0–34.0)
MCHC: 33.9 g/dL (ref 30.0–36.0)
MCV: 99.2 fL (ref 80.0–100.0)
Monocytes Absolute: 1.1 10*3/uL — ABNORMAL HIGH (ref 0.1–1.0)
Monocytes Relative: 11 %
Neutro Abs: 6.7 10*3/uL (ref 1.7–7.7)
Neutrophils Relative %: 72 %
Platelets: UNDETERMINED 10*3/uL (ref 150–400)
RBC: 3.6 MIL/uL — ABNORMAL LOW (ref 4.22–5.81)
RDW: 14.6 % (ref 11.5–15.5)
WBC: 9.3 10*3/uL (ref 4.0–10.5)
nRBC: 0 % (ref 0.0–0.2)

## 2020-05-05 LAB — I-STAT CHEM 8, ED
BUN: 119 mg/dL — ABNORMAL HIGH (ref 8–23)
Calcium, Ion: 1.24 mmol/L (ref 1.15–1.40)
Chloride: 110 mmol/L (ref 98–111)
Creatinine, Ser: 3.3 mg/dL — ABNORMAL HIGH (ref 0.61–1.24)
Glucose, Bld: 72 mg/dL (ref 70–99)
HCT: 32 % — ABNORMAL LOW (ref 39.0–52.0)
Hemoglobin: 10.9 g/dL — ABNORMAL LOW (ref 13.0–17.0)
Potassium: 5.9 mmol/L — ABNORMAL HIGH (ref 3.5–5.1)
Sodium: 137 mmol/L (ref 135–145)
TCO2: 17 mmol/L — ABNORMAL LOW (ref 22–32)

## 2020-05-05 LAB — COMPREHENSIVE METABOLIC PANEL
ALT: 31 U/L (ref 0–44)
AST: 31 U/L (ref 15–41)
Albumin: 4 g/dL (ref 3.5–5.0)
Alkaline Phosphatase: 57 U/L (ref 38–126)
Anion gap: 11 (ref 5–15)
BUN: 100 mg/dL — ABNORMAL HIGH (ref 8–23)
CO2: 19 mmol/L — ABNORMAL LOW (ref 22–32)
Calcium: 9.6 mg/dL (ref 8.9–10.3)
Chloride: 105 mmol/L (ref 98–111)
Creatinine, Ser: 3.31 mg/dL — ABNORMAL HIGH (ref 0.61–1.24)
GFR calc Af Amer: 20 mL/min — ABNORMAL LOW (ref >60)
GFR calc non Af Amer: 17 mL/min — ABNORMAL LOW (ref >60)
Glucose, Bld: 136 mg/dL — ABNORMAL HIGH (ref 70–99)
Potassium: 7.2 mmol/L (ref 3.5–5.1)
Sodium: 135 mmol/L (ref 135–145)
Total Bilirubin: 1.1 mg/dL (ref 0.3–1.2)
Total Protein: 6.8 g/dL (ref 6.5–8.1)

## 2020-05-05 LAB — SARS CORONAVIRUS 2 BY RT PCR (HOSPITAL ORDER, PERFORMED IN ~~LOC~~ HOSPITAL LAB): SARS Coronavirus 2: NEGATIVE

## 2020-05-05 LAB — URINALYSIS, ROUTINE W REFLEX MICROSCOPIC
Bilirubin Urine: NEGATIVE
Glucose, UA: NEGATIVE mg/dL
Hgb urine dipstick: NEGATIVE
Ketones, ur: NEGATIVE mg/dL
Leukocytes,Ua: NEGATIVE
Nitrite: NEGATIVE
Protein, ur: NEGATIVE mg/dL
Specific Gravity, Urine: 1.009 (ref 1.005–1.030)
pH: 5 (ref 5.0–8.0)

## 2020-05-05 LAB — LACTIC ACID, PLASMA: Lactic Acid, Venous: 1.4 mmol/L (ref 0.5–1.9)

## 2020-05-05 MED ORDER — SODIUM CHLORIDE 0.9 % IV SOLN: INTRAVENOUS | Status: DC

## 2020-05-05 MED ORDER — ONDANSETRON HCL 4 MG PO TABS: 4.0000 mg | ORAL_TABLET | Freq: Four times a day (QID) | ORAL | Status: DC | PRN

## 2020-05-05 MED ORDER — ACETAMINOPHEN 325 MG PO TABS: 650.0000 mg | ORAL_TABLET | Freq: Four times a day (QID) | ORAL | Status: DC | PRN

## 2020-05-05 MED ORDER — ONDANSETRON HCL 4 MG/2ML IJ SOLN: 4.0000 mg | Freq: Four times a day (QID) | INTRAMUSCULAR | Status: DC | PRN

## 2020-05-05 MED ORDER — SODIUM CHLORIDE 0.9 % IV BOLUS
500.0000 mL | Freq: Once | INTRAVENOUS | Status: AC
  Administered 2020-05-05: 500 mL via INTRAVENOUS

## 2020-05-05 MED ORDER — HEPARIN SODIUM (PORCINE) 5000 UNIT/ML IJ SOLN: 5000.0000 [IU] | Freq: Three times a day (TID) | INTRAMUSCULAR | Status: DC

## 2020-05-05 MED ORDER — SODIUM CHLORIDE 0.9 % IV BOLUS
1000.0000 mL | Freq: Once | INTRAVENOUS | Status: AC
  Administered 2020-05-05: 1000 mL via INTRAVENOUS

## 2020-05-05 MED ORDER — ACETAMINOPHEN 650 MG RE SUPP: 650.0000 mg | Freq: Four times a day (QID) | RECTAL | Status: DC | PRN

## 2020-05-05 NOTE — H&P (Signed)
History and Physical    Vernon Briggs SAY:[id number] DOB: June 07, 1946 DOA: 05/05/2020  PCP: [name], MD  Patient coming from: Home  I have personally briefly reviewed patient's old medical records in [place]  Chief Complaint: Diarrhea, hypotension  HPI: Vernon Briggs is a 74 y.o. male with medical history significant of stroke, CAD s/p CABG, HFrEF (25-30%, S/P AICD), CKD 3, DM2, HTN, PAF on eliquis.  Pt has been having diarrhea that has been ongoing for several years apparently.  Diarrhea unchanged during that time frame per wife.  He has been eating and drinking as usual.  He has been intermittently hypotensive for past couple of weeks.  Wife reports they are here because home health sent them in.  Wife reports current mental status is actually his baseline owing to prior strokes (presumably pt has some underlying vascular dementia).  Home health reportedly found pt to have SBP of 60 at home prompting EMS to be called.   ED Course: Creat of 3.3 today up from 1.8 baseline, BUN 100.  Initial potassium 7.2, this improves to 5.9 on repeat after just 1.5L NS bolus.  Bladder scan is negative.  GI pathogen pnl pending.  Pt's only major complaints at the moment are that he is cold and wants help re-positioning in bed.  Review of Systems: As per HPI, otherwise all review of systems negative.  Past Medical History:  Diagnosis Date  . AICD (automatic cardioverter/defibrillator) present   . Anxiety   . Arthritis    "occasionally in my right hand" (04/02/2015)  . CAD in native artery 1999  . Cancer ([place])   . CHF (congestive heart failure) ([place])   . Depression   . GERD (gastroesophageal reflux disease)    "occasionally" (04/02/2015)  . History of hiatal hernia   . Hypercholesterolemia   . Hypertension   . Myocardial infarction ([place]) "several"  . OSA on CPAP   . PAF (paroxysmal atrial fibrillation) ([place])   . PTSD (post-traumatic stress disorder)   . Stroke ([place])     "I've had 2; my right leg & arm slightly weaker since" (04/02/2015)  . Type II diabetes mellitus ([place])     Past Surgical History:  Procedure Laterality Date  . CARDIAC DEFIBRILLATOR PLACEMENT     [place]  . CORONARY ANGIOPLASTY WITH STENT PLACEMENT     "I've got 7 stents total" (04/02/2015)  . CORONARY ARTERY BYPASS GRAFT  1999   CABG X4; LIMA-LAD, SVG-OM1, SVG-RI, SVG-dRCA  . CORONARY STENT INTERVENTION N/A 11/24/2019   Procedure: CORONARY STENT INTERVENTION;  Surgeon: [name], MD;  Location: [facility];  Service: Cardiovascular;  Laterality: N/A;  . INTRAVASCULAR ULTRASOUND/IVUS N/A 11/24/2019   Procedure: Intravascular Ultrasound/IVUS;  Surgeon: [name], MD;  Location: [facility];  Service: Cardiovascular;  Laterality: N/A;  . LEFT HEART CATH N/A 11/24/2019   Procedure: Left Heart Cath;  Surgeon: [name], MD;  Location: [facility];  Service: Cardiovascular;  Laterality: N/A;  . LEFT HEART CATH AND CORS/GRAFTS ANGIOGRAPHY N/A 11/21/2019   Procedure: LEFT HEART CATH AND CORS/GRAFTS ANGIOGRAPHY;  Surgeon: Burnell Blanks, MD;  Location: [facility];  Service: Cardiovascular;  Laterality: N/A;  . LEFT HEART CATHETERIZATION WITH CORONARY/GRAFT ANGIOGRAM N/A 09/14/2014   Procedure: LEFT HEART CATHETERIZATION WITH Beatrix Fetters;  Surgeon: Leonie Man, MD;  Location: [facility] C[facility] LAB;  Service: Cardiovascular;  Laterality: N/A;  . PERCUTANEOUS CORONARY STENT INTERVENTION (PCI-S) Left 09/14/2014   Procedure: PERCUTANEOUS CORONARY STENT  INTERVENTION (PCI-S);  Surgeon: Leonie Man, MD;  Location: [facility] CATH LAB;  Service: Cardiovascular;  Laterality: Left;     reports that he has quit smoking. His smoking use included cigarettes. He has a 0.50 pack-year smoking history. He has never used smokeless tobacco. He reports current alcohol use. He reports that he does not use drugs.  Allergies  Allergen Reactions  . Tape Other  (See Comments)    Causes skin irritation    Family History  Family history unknown: Yes     Prior to Admission medications   Medication Sig Start Date End Date Taking? Authorizing Provider  acetaminophen (TYLENOL) 325 MG tablet Take 2 tablets (650 mg total) by mouth every 4 (four) hours as needed for headache or mild pain. Patient taking differently: Take 325-650 mg by mouth every 4 (four) hours as needed for headache or mild pain.  09/15/14   Erlene Quan, PA-C  apixaban (ELIQUIS) 5 MG TABS tablet Take 5 mg by mouth every 12 (twelve) hours.    [provider]  carvedilol (COREG) 3.125 MG tablet Take 1 tablet (3.125 mg total) by mouth 2 (two) times daily with a meal. 11/26/19 12/26/19  Amin, Jeanella Flattery, MD  Cholecalciferol (VITAMIN D-3) 25 MCG (1000 UT) CAPS Take 1,000 Units by mouth daily.    [provider]  clopidogrel (PLAVIX) 75 MG tablet Take 75 mg by mouth daily.    [provider]  FLUoxetine (PROZAC) 20 MG capsule Take 40 mg by mouth at bedtime.     [provider]  hydroxypropyl methylcellulose / hypromellose (ISOPTO TEARS / GONIOVISC) 2.5 % ophthalmic solution Place 1 drop into both eyes 2 (two) times daily as needed for dry eyes.     [provider]  insulin regular human CONCENTRATED (HUMULIN R U-500 KWIKPEN) 500 UNIT/ML kwikpen Inject 55-110 Units into the skin See admin instructions. Inject 110 units into the skin in the morning before breakfast and 55 units at bedtime    [provider]  isosorbide mononitrate (IMDUR) 30 MG 24 hr tablet Take 1 tablet (30 mg total) by mouth daily. 11/27/19   Amin, Jeanella Flattery, MD  ketoconazole (NIZORAL) 2 % cream Apply 1 application topically daily as needed for irritation.    [provider]  loperamide (IMODIUM A-D) 2 MG tablet Take 2 mg by mouth daily as needed for diarrhea or loose stools.     [provider]  nitroGLYCERIN (NITROSTAT) 0.4 MG SL tablet Place 1 tablet  (0.4 mg total) under the tongue every 5 (five) minutes x 3 doses as needed for chest pain. 09/15/14   Erlene Quan, PA-C  rosuvastatin (CRESTOR) 20 MG tablet Take 1 tablet (20 mg total) by mouth at bedtime. 11/26/19   Amin, Jeanella Flattery, MD  torsemide (DEMADEX) 20 MG tablet Take 40 mg by mouth in the morning.     [provider]    Physical Exam: Vitals:   05/05/20 2130 05/05/20 2200 05/05/20 2221 05/05/20 2315  BP: (!) 110/92 120/62 (!) 95/49 (!) 117/54  Pulse: (!) 56 (!) 57 (!) 54 (!) 52  Resp: 17 15 19  (!) 21  Temp:      TempSrc:      SpO2: 100% 100% 100% 99%  Weight:      Height:        Constitutional: NAD, calm, comfortable Eyes: PERRL, lids and conjunctivae normal ENMT: Mucous membranes are moist. Posterior pharynx clear of any exudate or lesions.Normal dentition.  Neck:  normal, supple, no masses, no thyromegaly Respiratory: clear to auscultation bilaterally, no wheezing, no crackles. Normal respiratory effort. No accessory muscle use.  Cardiovascular: Regular rate and rhythm, no murmurs / rubs / gallops. No extremity edema. 2+ pedal pulses. No carotid bruits.  Abdomen: no tenderness, no masses palpated. No hepatosplenomegaly. Bowel sounds positive.  Musculoskeletal: no clubbing / cyanosis. No joint deformity upper and lower extremities. Good ROM, no contractures. Normal muscle tone.  Skin: no rashes, lesions, ulcers. No induration Neurologic: CN 2-12 grossly intact. Sensation intact, DTR normal. Strength 5/5 in all 4.  Psychiatric: Pleasant, mildly demented but baseline per wife.   Labs on Admission: I have personally reviewed following labs and imaging studies  CBC: Recent Labs  Lab 05/05/20 1349 05/05/20 2231  WBC 9.3  --   NEUTROABS 6.7  --   HGB 12.1* 10.9*  HCT 35.7* 32.0*  MCV 99.2  --   PLT PLATELET CLUMPS NOTED ON SMEAR, UNABLE TO ESTIMATE  --    Basic Metabolic Panel: Recent Labs  Lab 05/05/20 1349 05/05/20 2231  NA 135 137  K 7.2* 5.9*  CL  105 110  CO2 19*  --   GLUCOSE 136* 72  BUN 100* 119*  CREATININE 3.31* 3.30*  CALCIUM 9.6  --    GFR: Estimated Creatinine Clearance: 26.7 mL/min (A) (by C-G formula based on SCr of 3.3 mg/dL (H)). Liver Function Tests: Recent Labs  Lab 05/05/20 1349  AST 31  ALT 31  ALKPHOS 57  BILITOT 1.1  PROT 6.8  ALBUMIN 4.0   No results for input(s): LIPASE, AMYLASE in the last 168 hours. No results for input(s): AMMONIA in the last 168 hours. Coagulation Profile: No results for input(s): INR, PROTIME in the last 168 hours. Cardiac Enzymes: No results for input(s): CKTOTAL, CKMB, CKMBINDEX, TROPONINI in the last 168 hours. BNP (last 3 results) No results for input(s): PROBNP in the last 8760 hours. HbA1C: No results for input(s): HGBA1C in the last 72 hours. CBG: No results for input(s): GLUCAP in the last 168 hours. Lipid Profile: No results for input(s): CHOL, HDL, LDLCALC, TRIG, CHOLHDL, LDLDIRECT in the last 72 hours. Thyroid Function Tests: No results for input(s): TSH, T4TOTAL, FREET4, T3FREE, THYROIDAB in the last 72 hours. Anemia Panel: No results for input(s): VITAMINB12, FOLATE, FERRITIN, TIBC, IRON, RETICCTPCT in the last 72 hours. Urine analysis:    Component Value Date/Time   COLORURINE YELLOW 05/05/2020 1949   APPEARANCEUR CLEAR 05/05/2020 1949   LABSPEC 1.009 05/05/2020 1949   PHURINE 5.0 05/05/2020 1949   GLUCOSEU NEGATIVE 05/05/2020 1949   HGBUR NEGATIVE 05/05/2020 1949   BILIRUBINUR NEGATIVE 05/05/2020 1949   KETONESUR NEGATIVE 05/05/2020 1949   PROTEINUR NEGATIVE 05/05/2020 1949   UROBILINOGEN 0.2 04/02/2015 1341   NITRITE NEGATIVE 05/05/2020 1949   LEUKOCYTESUR NEGATIVE 05/05/2020 1949    Radiological Exams on Admission: DG Chest Port 1 View  Result Date: 05/05/2020 CLINICAL DATA:  Hypotension EXAM: PORTABLE CHEST 1 VIEW COMPARISON:  11/19/2019 FINDINGS: Post sternotomy changes. Left-sided pacing device as before. No focal opacity or pleural  effusion. Stable enlarged cardiomediastinal silhouette. No pneumothorax. IMPRESSION: No active disease. Cardiomegaly. Electronically Signed   By: Donavan Foil M.D.   On: 05/05/2020 19:56    EKG: Independently reviewed.  Assessment/Plan Principal Problem:   AKI (acute kidney injury) ([place]) Active Problems:   History of CVA (cerebrovascular accident)   Type II diabetes mellitus with complication (HCC)   Essential hypertension   Cardiomyopathy, ischemic   PAF (paroxysmal atrial  fibrillation) (HCC)   Hyperkalemia   CKD (chronic kidney disease) stage 3, GFR 30-59 ml/min    1. AKI on CKD 3 - 1. Suspect pre-renal / ATN due to dehydration from chronic diarrhea most likely 2. Getting renal US to r/o obstruction, bladder scan was neg 3. IVF: 1.5L bolus then 150 cc/hr NS 4. Strict intake and output 5. If not rapidly improving, get nephro consult in AM. 6. Holding diuretics 2. Hyperkalemia - due to above 1. Thankfully seems to already be rapidly improving with K down from 7.4 to 5.9 after just 1.5L NS bolus. 2. Tele monitor 3. Check BMP Q4H 3. ICM HFrEF - 1. Close monitoring for fluid overload with IVF resuscitation. 2. Hold imdur 3. Cont plavix 4. Cont crestor 4. DM2 - 1. Med rec today is pending but looks like as of march he was taking U500 at 110u in AM + 55u PM for a total insulin requirement of 165u daily. 2. Given AKI will put on a reduced dose regimin to start with: 3. Lantus 20u QHS 4. Novolog 8u TID AC 5. And resistant SSI 6. May very well need to increase significantly during his stay. 5. PAF - 1. Will use heparin gtt instead of eliquis due to 1) AKI and 2) hemoccult positive stool 2. cont coreg 6. HTN - 1. Holding diuretics and IMDUR due to report of hypotension at home. 7. Occult blood positive stool - 1. Despite being on chronic eliquis, pt's diarrhea without gross blood and ongoing for months to years. 2. Additionally HGB stable at 12.1 on presentation (10.9 after  IVF boluses), his baseline looks to be around 11. 3. Doubt acute large volume GIB 4. Will put on heparin gtt instead of eliquis just-in-case however for ease of reversibility.  DVT prophylaxis: Heparin per pharm Code Status: Full Family Communication: No family in room Disposition Plan: Home after renal status improved Consults called: None Admission status: Admit to inpatient  Severity of Illness: The appropriate patient status for this patient is INPATIENT. Inpatient status is judged to be reasonable and necessary in order to provide the required intensity of service to ensure the patient's safety. The patient's presenting symptoms, physical exam findings, and initial radiographic and laboratory data in the context of their chronic comorbidities is felt to place them at high risk for further clinical deterioration. Furthermore, it is not anticipated that the patient will be medically stable for discharge from the hospital within 2 midnights of admission. The following factors support the patient status of inpatient.   IP status due to AKI with creat 3.3 up from 1.8 baseline, also initial hyperkalemia with K 7.4.    * I certify that at the point of admission it is my clinical judgment that the patient will require inpatient hospital care spanning beyond 2 midnights from the point of admission due to high intensity of service, high risk for further deterioration and high frequency of surveillance required.*    Briony Parveen M. DO [facility]  How to contact the [facility] Attending or Consulting provider [place] or covering provider during after hours [place], for this patient?  1. Check the care team in [facility] and look for a) attending/consulting [facility] provider listed and b) the [facility] team listed 2. Log into [email or web address]  Amion Physician Scheduling and messaging for groups and whole hospitals  On call and physician scheduling software for group practices, residents, hospitalists and other  medical providers for call, clinic, rotation and shift schedules. On[employer] is  a hospital-wide system for scheduling doctors and paging doctors on call. EasyPlot is for scientific plotting and data analysis.  [email or web address]  and use [place]'s universal password to access. If you do not have the password, please contact the hospital operator.  3. Locate the [facility] provider you are looking for under [facility] and page to a number that you can be directly reached. 4. If you still have difficulty reaching the provider, please page the [facility] (Director on Call) for the Hospitalists listed on amion for assistance.  05/05/2020, 11:50 PM

## 2020-05-05 NOTE — ED Notes (Signed)
Dr Regenia Skeeter aware K 7.2, no hemolysis

## 2020-05-05 NOTE — ED Triage Notes (Signed)
Pt brought to ED by EMS from home for c/o diarrhea and hypotension reported by home health nurse SBP on 60.

## 2020-05-05 NOTE — ED Notes (Signed)
Pt's wife came out to nurse's station saying that pt's BP was low. RN went into room and found pt laying on his side with his R arm above him. RN explained that BP's can read low depending on patients positioning and he'd need to be on his back in order to get an accurate one. RN asked pt to lay on his back so BP can be rechecked, pt stated "no, I lay on my side" RN tried to explain importance of having an accurate blood pressure, pt stated he was cold and needed more blankets first. RN told pt that BP is the priority over blankets, pt agree to put arm straight by side but wouldn't lay fully on his back. BP was 102/38

## 2020-05-05 NOTE — ED Provider Notes (Signed)
[facility] EMERGENCY DEPARTMENT Provider Note   CSN: [id number] Arrival date & time: 05/05/20  1331     History Chief Complaint  Patient presents with  . Diarrhea  . Hypotension    Vernon Briggs is a 74 y.o. male.  HPI     Level 5 D2 patient unable to give history as he states he does not remember 74 yo male presents via ems with reports of diarrhea and hypotension from home health care nurse relayed through EMS.  Patient is unable to give any additional history.  I attempted to call his wife nonverbal at this point have not been able to contact. Extensive past medical history and d/c summary from March 2021 after patient admitted for chest pain evaluation Patient denies chest pain, fever, cough dyspnea, but also does not really endorse diarrhea or other symptoms nor seem to know why he was sent to ED.   Wife at bedside on reevaluation.  She states that they were told to come to the ED by PT that came in.  She states that he has been having diarrhea that has been ongoing for several months.  She has not noted any significant change.  He has been eating and drinking as usual.  Patient is on chronic anticoagulation with history of stroke and paroxysmal atrial fibrillation Past Medical History:  Diagnosis Date  . AICD (automatic cardioverter/defibrillator) present   . Anxiety   . Arthritis    "occasionally in my right hand" (04/02/2015)  . CAD in native artery 1999  . Cancer ([place])   . CHF (congestive heart failure) ([place])   . Depression   . GERD (gastroesophageal reflux disease)    "occasionally" (04/02/2015)  . History of hiatal hernia   . Hypercholesterolemia   . Hypertension   . Myocardial infarction ([place]) "several"  . OSA on CPAP   . PAF (paroxysmal atrial fibrillation) ([place])   . PTSD (post-traumatic stress disorder)   . Stroke ([place])    "I've had 2; my right leg & arm slightly weaker since" (04/02/2015)  . Type II diabetes mellitus [facility])      Patient Active Problem List   Diagnosis Date Noted  . Acute systolic heart failure ([place])   . CKD (chronic kidney disease) stage 2, GFR 60-89 ml/min 11/20/2019  . NSTEMI (non-ST elevated myocardial infarction) ([place]) 11/20/2019  . Obesity 11/20/2019  . Unstable angina ([place])   . Chest pain 04/02/2015  . Acute renal failure ([place]) 04/02/2015  . Weakness generalized 04/02/2015  . Cardiomyopathy, ischemic 09/15/2014  . CAD S/P CFX DES 09/14/14 09/15/2014  . PAF (paroxysmal atrial fibrillation) ([place]) 09/15/2014  . Chronic anticoagulation-Coumadin 09/15/2014  . S/P CABG x 12-1997   . Elevated troponin   . Abnormal nuclear stress test 09/11/2014    Class: Diagnosis of  . Acute coronary syndrome ([place]) 09/09/2014  . Hypertensive urgency 09/09/2014  . Essential hypertension   . History of CVA (cerebrovascular accident) 10/20/2012  . TIA (transient ischemic attack) 10/20/2012  . Automatic implantable cardioverter-defibrillator in situ 10/20/2012  . Type II diabetes mellitus with complication ([place]) 24/40/1027  . Hyperlipidemia with target LDL less than 70 10/20/2012  . Thrombocytopenia-plts 90-70K 10/20/2012    Past Surgical History:  Procedure Laterality Date  . CARDIAC DEFIBRILLATOR PLACEMENT     [place]  . CORONARY ANGIOPLASTY WITH STENT PLACEMENT     "I've got 7 stents total" (04/02/2015)  . CORONARY ARTERY BYPASS GRAFT  1999   CABG X4; LIMA-LAD, SVG-OM1, SVG-RI, SVG-dRCA  .  CORONARY STENT INTERVENTION N/A 11/24/2019   Procedure: CORONARY STENT INTERVENTION;  Surgeon: [name], MD;  Location: [facility] CV LAB;  Service: Cardiovascular;  Laterality: N/A;  . INTRAVASCULAR ULTRASOUND/IVUS N/A 11/24/2019   Procedure: Intravascular Ultrasound/IVUS;  Surgeon: [name], MD;  Location: [facility] CV LAB;  Service: Cardiovascular;  Laterality: N/A;  . LEFT HEART CATH N/A 11/24/2019   Procedure: Left Heart Cath;  Surgeon: [name], MD;  Location: [facility] CV LAB;   Service: Cardiovascular;  Laterality: N/A;  . LEFT HEART CATH AND CORS/GRAFTS ANGIOGRAPHY N/A 11/21/2019   Procedure: LEFT HEART CATH AND CORS/GRAFTS ANGIOGRAPHY;  Surgeon: Burnell Blanks, MD;  Location: [facility] CV LAB;  Service: Cardiovascular;  Laterality: N/A;  . LEFT HEART CATHETERIZATION WITH CORONARY/GRAFT ANGIOGRAM N/A 09/14/2014   Procedure: LEFT HEART CATHETERIZATION WITH Beatrix Fetters;  Surgeon: Leonie Man, MD;  Location[facility];  Service: Cardiovascular;  Laterality: N/A;  . PERCUTANEOUS CORONARY STENT INTERVENTION (PCI-S) Left 09/14/2014   Procedure: PERCUTANEOUS CORONARY STENT INTERVENTION (PCI-S);  Surgeon: Leonie Man, MD;  Location: [facility] CATH LAB;  Service: Cardiovascular;  Laterality: Left;       Family History  Family history unknown: Yes    Social History   Tobacco Use  . Smoking status: Former Smoker    Packs/day: 0.50    Years: 1.00    Pack years: 0.50    Types: Cigarettes  . Smokeless tobacco: Never Used  . Tobacco comment: "quit smoking cigarettes in the 1960's"  Substance Use Topics  . Alcohol use: Yes    Comment: 04/02/2015 "might have a beer and a mixed drink/month"  . Drug use: No    Home Medications Prior to Admission medications   Medication Sig Start Date End Date Taking? Authorizing Provider  acetaminophen (TYLENOL) 325 MG tablet Take 2 tablets (650 mg total) by mouth every 4 (four) hours as needed for headache or mild pain. Patient taking differently: Take 325-650 mg by mouth every 4 (four) hours as needed for headache or mild pain.  09/15/14   Erlene Quan, PA-C  apixaban (ELIQUIS) 5 MG TABS tablet Take 5 mg by mouth every 12 (twelve) hours.    [provider]  carvedilol (COREG) 3.125 MG tablet Take 1 tablet (3.125 mg total) by mouth 2 (two) times daily with a meal. 11/26/19 12/26/19  Amin, Jeanella Flattery, MD  Cholecalciferol (VITAMIN D-3) 25 MCG (1000 UT) CAPS Take 1,000 Units by mouth daily.    [provider]  clopidogrel (PLAVIX) 75 MG tablet Take 75 mg by mouth daily.    [provider]  FLUoxetine (PROZAC) 20 MG capsule Take 40 mg by mouth at bedtime.     [provider]  hydroxypropyl methylcellulose / hypromellose (ISOPTO TEARS / GONIOVISC) 2.5 % ophthalmic solution Place 1 drop into both eyes 2 (two) times daily as needed for dry eyes.     [provider]  insulin regular human CONCENTRATED (HUMULIN R U-500 KWIKPEN) 500 UNIT/ML kwikpen Inject 55-110 Units into the skin See admin instructions. Inject 110 units into the skin in the morning before breakfast and 55 units at bedtime    [provider]  isosorbide mononitrate (IMDUR) 30 MG 24 hr tablet Take 1 tablet (30 mg total) by mouth daily. 11/27/19   Amin, Jeanella Flattery, MD  ketoconazole (NIZORAL) 2 % cream Apply 1 application topically daily as needed for irritation.    [provider]  loperamide (IMODIUM A-D) 2 MG tablet Take  2 mg by mouth daily as needed for diarrhea or loose stools.     [provider]  nitroGLYCERIN (NITROSTAT) 0.4 MG SL tablet Place 1 tablet (0.4 mg total) under the tongue every 5 (five) minutes x 3 doses as needed for chest pain. 09/15/14   Erlene Quan, PA-C  rosuvastatin (CRESTOR) 20 MG tablet Take 1 tablet (20 mg total) by mouth at bedtime. 11/26/19   Amin, Jeanella Flattery, MD  torsemide (DEMADEX) 20 MG tablet Take 40 mg by mouth in the morning.     [provider]    Allergies    Tape  Review of Systems   Review of Systems  Unable to perform ROS: Other    Physical Exam Updated Vital Signs BP (!) 110/57 (BP Location: Right Arm)   Pulse 65   Temp 98 F (36.7 C) (Oral)   Resp 18   Ht 1.829 m (6')   Wt 131.8 kg   SpO2 100%   BMI 39.41 kg/m   Physical Exam Vitals and nursing note reviewed.  Constitutional:      General: He is in acute distress.     Appearance: He is obese. He is not ill-appearing.  HENT:     Head: Normocephalic.       Right Ear: External ear normal.     Left Ear: External ear normal.     Nose: Nose normal.     Mouth/Throat:     Pharynx: Oropharynx is clear.  Eyes:     Pupils: Pupils are equal, round, and reactive to light.  Cardiovascular:     Rate and Rhythm: Normal rate and regular rhythm.     Pulses: Normal pulses.     Heart sounds: Normal heart sounds.  Pulmonary:     Effort: Pulmonary effort is normal.     Breath sounds: Normal breath sounds.  Abdominal:     General: There is distension.     Tenderness: There is no abdominal tenderness.  Musculoskeletal:        General: No swelling or tenderness. Normal range of motion.     Cervical back: Normal range of motion and neck supple.     Right lower leg: No edema.     Left lower leg: No edema.  Skin:    General: Skin is warm.     Capillary Refill: Capillary refill takes less than 2 seconds.  Neurological:     General: No focal deficit present.     Mental Status: He is alert.  Psychiatric:        Mood and Affect: Mood normal.        Behavior: Behavior normal.     ED Results / Procedures / Treatments   Labs (all labs ordered are listed, but only abnormal results are displayed) Labs Reviewed  CBC WITH DIFFERENTIAL/PLATELET - Abnormal; Notable for the following components:      Result Value   RBC 3.60 (*)    Hemoglobin 12.1 (*)    HCT 35.7 (*)    Monocytes Absolute 1.1 (*)    All other components within normal limits  COMPREHENSIVE METABOLIC PANEL - Abnormal; Notable for the following components:   Potassium 7.2 (*)    CO2 19 (*)    Glucose, Bld 136 (*)    BUN 100 (*)    Creatinine, Ser 3.31 (*)    GFR calc non Af Amer 17 (*)    GFR calc Af Amer 20 (*)    All other components within normal  limits    EKG EKG Interpretation  Date/Time:  Wednesday May 05 2020 13:32:47 EDT Ventricular Rate:  63 PR Interval:    QRS Duration: 174 QT Interval:  454 QTC Calculation: 464 R Axis:   -71 Text Interpretation: VENTRICULAR  PACED RHYTHM Confirmed by Pattricia Boss [phone]) on 05/05/2020 7:35:46 PM   Radiology DG Chest Port 1 View  Result Date: 05/05/2020 CLINICAL DATA:  Hypotension EXAM: PORTABLE CHEST 1 VIEW COMPARISON:  11/19/2019 FINDINGS: Post sternotomy changes. Left-sided pacing device as before. No focal opacity or pleural effusion. Stable enlarged cardiomediastinal silhouette. No pneumothorax. IMPRESSION: No active disease. Cardiomegaly. Electronically Signed   By: Donavan Foil M.D.   On: 05/05/2020 19:56    Procedures .Critical Care Performed by: Pattricia Boss, MD Authorized by: Pattricia Boss, MD   Critical care provider statement:    Critical care time (minutes):  45   Critical care end time:  05/05/2020 10:52 PM   Critical care was necessary to treat or prevent imminent or life-threatening deterioration of the following conditions:  Metabolic crisis and renal failure   Critical care was time spent personally by me on the following activities:  Discussions with consultants, evaluation of patient's response to treatment, examination of patient, ordering and performing treatments and interventions, ordering and review of laboratory studies, ordering and review of radiographic studies, pulse oximetry, re-evaluation of patient's condition, obtaining history from patient or surrogate and review of old charts   (including critical care time)  Medications Ordered in ED Medications  sodium chloride 0.9 % bolus 1,000 mL (has no administration in time range)    ED Course  I have reviewed the triage vital signs and the nursing notes.  Pertinent labs & imaging results that were available during my care of the patient were reviewed by me and considered in my medical decision making (see chart for details).    MDM Rules/Calculators/A&P                          Patient with reported diarrhea and hypotension Here low bp for this obese cardiac patient but stable with last bp 110/57. Appears to have aki  superimposed on ckd with creatinine here at 3.31 from first prior off 1.83 on 11/26/19 Bladder scan and urinalysis pending  Hyperkalemia without acute ekg changes.  Patient on monitor and will need repeat potassium after iv fluids Plan cxr, fluids and admission for renal failure 10:34 PM Recheck potassium pending. 10:40 PM Recheck potassium 5.9 Patient had loose stool- UOP to be followed  Plan admission for patient with hypotension and aki with hyperkalemia ?source volume depletion with diarrhea although wife does not report acute changes in bowel movements- stool sent for hemocult and pathogens BP have been low normal with one recent lower at 95/49 IV hydration ensuing, potassium decreasing with iv fluids  Discussed with Dr. Alcario Drought who will see for admission   Final Clinical Impression(s) / ED Diagnoses Final diagnoses:  Acute renal failure, unspecified acute renal failure type ([place])  Hyperkalemia  Diarrhea, unspecified type  Volume depletion    Rx / DC Orders ED Discharge Orders    None       Pattricia Boss, MD 05/05/20 2253

## 2020-05-05 NOTE — ED Notes (Addendum)
Pt's wife reports pt is at baseline orientation and weakness wise d/t previous CVAs. Reports pt reported dizziness earlier this day along with intermittent hypotension x "a few weeks". Pt's wife also reports diarrhea is chronic x years and that his stool was watery today and has a distinct odor and is "orangey brown".

## 2020-05-05 NOTE — ED Notes (Signed)
Vernon Briggs wife [phone]

## 2020-05-06 DIAGNOSIS — E118 Type 2 diabetes mellitus with unspecified complications: Secondary | ICD-10-CM

## 2020-05-06 DIAGNOSIS — Z515 Encounter for palliative care: Secondary | ICD-10-CM

## 2020-05-06 DIAGNOSIS — I255 Ischemic cardiomyopathy: Secondary | ICD-10-CM

## 2020-05-06 DIAGNOSIS — R531 Weakness: Secondary | ICD-10-CM

## 2020-05-06 DIAGNOSIS — Z8673 Personal history of transient ischemic attack (TIA), and cerebral infarction without residual deficits: Secondary | ICD-10-CM

## 2020-05-06 DIAGNOSIS — I480 Paroxysmal atrial fibrillation: Secondary | ICD-10-CM

## 2020-05-06 DIAGNOSIS — I10 Essential (primary) hypertension: Secondary | ICD-10-CM

## 2020-05-06 DIAGNOSIS — E875 Hyperkalemia: Secondary | ICD-10-CM

## 2020-05-06 DIAGNOSIS — Z7189 Other specified counseling: Secondary | ICD-10-CM

## 2020-05-06 DIAGNOSIS — N183 Chronic kidney disease, stage 3 unspecified: Secondary | ICD-10-CM

## 2020-05-06 DIAGNOSIS — R197 Diarrhea, unspecified: Secondary | ICD-10-CM

## 2020-05-06 DIAGNOSIS — E869 Volume depletion, unspecified: Secondary | ICD-10-CM

## 2020-05-06 DIAGNOSIS — N179 Acute kidney failure, unspecified: Principal | ICD-10-CM

## 2020-05-06 LAB — BLOOD GAS, VENOUS
Acid-base deficit: 8.6 mmol/L — ABNORMAL HIGH (ref 0.0–2.0)
Bicarbonate: 16.9 mmol/L — ABNORMAL LOW (ref 20.0–28.0)
Drawn by: 1444
FIO2: 21
O2 Saturation: 98.2 %
Patient temperature: 37
pCO2, Ven: 38.1 mmHg — ABNORMAL LOW (ref 44.0–60.0)
pH, Ven: 7.271 (ref 7.250–7.430)
pO2, Ven: 114 mmHg — ABNORMAL HIGH (ref 32.0–45.0)

## 2020-05-06 LAB — BASIC METABOLIC PANEL
Anion gap: 13 (ref 5–15)
Anion gap: 9 (ref 5–15)
Anion gap: 10 (ref 5–15)
BUN: 86 mg/dL — ABNORMAL HIGH (ref 8–23)
BUN: 78 mg/dL — ABNORMAL HIGH (ref 8–23)
BUN: 75 mg/dL — ABNORMAL HIGH (ref 8–23)
CO2: 13 mmol/L — ABNORMAL LOW (ref 22–32)
CO2: 16 mmol/L — ABNORMAL LOW (ref 22–32)
CO2: 16 mmol/L — ABNORMAL LOW (ref 22–32)
Calcium: 8.9 mg/dL (ref 8.9–10.3)
Calcium: 9 mg/dL (ref 8.9–10.3)
Calcium: 8.5 mg/dL — ABNORMAL LOW (ref 8.9–10.3)
Chloride: 110 mmol/L (ref 98–111)
Chloride: 110 mmol/L (ref 98–111)
Chloride: 110 mmol/L (ref 98–111)
Creatinine, Ser: 2.55 mg/dL — ABNORMAL HIGH (ref 0.61–1.24)
Creatinine, Ser: 2.47 mg/dL — ABNORMAL HIGH (ref 0.61–1.24)
Creatinine, Ser: 2.5 mg/dL — ABNORMAL HIGH (ref 0.61–1.24)
GFR calc Af Amer: 27 mL/min — ABNORMAL LOW (ref >60)
GFR calc Af Amer: 28 mL/min — ABNORMAL LOW (ref >60)
GFR calc Af Amer: 28 mL/min — ABNORMAL LOW (ref >60)
GFR calc non Af Amer: 23 mL/min — ABNORMAL LOW (ref >60)
GFR calc non Af Amer: 24 mL/min — ABNORMAL LOW (ref >60)
GFR calc non Af Amer: 24 mL/min — ABNORMAL LOW (ref >60)
Glucose, Bld: 160 mg/dL — ABNORMAL HIGH (ref 70–99)
Glucose, Bld: 177 mg/dL — ABNORMAL HIGH (ref 70–99)
Glucose, Bld: 209 mg/dL — ABNORMAL HIGH (ref 70–99)
Potassium: 6.9 mmol/L (ref 3.5–5.1)
Potassium: 5.8 mmol/L — ABNORMAL HIGH (ref 3.5–5.1)
Potassium: 6 mmol/L — ABNORMAL HIGH (ref 3.5–5.1)
Sodium: 136 mmol/L (ref 135–145)
Sodium: 135 mmol/L (ref 135–145)
Sodium: 136 mmol/L (ref 135–145)

## 2020-05-06 LAB — CBG MONITORING, ED
Glucose-Capillary: 116 mg/dL — ABNORMAL HIGH (ref 70–99)
Glucose-Capillary: 154 mg/dL — ABNORMAL HIGH (ref 70–99)
Glucose-Capillary: 156 mg/dL — ABNORMAL HIGH (ref 70–99)

## 2020-05-06 LAB — GLUCOSE, CAPILLARY: Glucose-Capillary: 199 mg/dL — ABNORMAL HIGH (ref 70–99)

## 2020-05-06 LAB — GASTROINTESTINAL PANEL BY PCR, STOOL (REPLACES STOOL CULTURE)

## 2020-05-06 LAB — APTT
aPTT: 31 seconds (ref 24–36)
aPTT: 68 seconds — ABNORMAL HIGH (ref 24–36)
aPTT: 65 seconds — ABNORMAL HIGH (ref 24–36)

## 2020-05-06 LAB — HEPARIN LEVEL (UNFRACTIONATED)
Heparin Unfractionated: >2.2 IU/mL — ABNORMAL HIGH (ref 0.30–0.70)
Heparin Unfractionated: >2.2 IU/mL — ABNORMAL HIGH (ref 0.30–0.70)

## 2020-05-06 LAB — CBC
HCT: 34.4 % — ABNORMAL LOW (ref 39.0–52.0)
Hemoglobin: 11.3 g/dL — ABNORMAL LOW (ref 13.0–17.0)
MCH: 32.8 pg (ref 26.0–34.0)
MCHC: 32.8 g/dL (ref 30.0–36.0)
MCV: 99.7 fL (ref 80.0–100.0)
Platelets: 100 10*3/uL — ABNORMAL LOW (ref 150–400)
RBC: 3.45 MIL/uL — ABNORMAL LOW (ref 4.22–5.81)
RDW: 14.8 % (ref 11.5–15.5)
WBC: 9.1 10*3/uL (ref 4.0–10.5)
nRBC: 0 % (ref 0.0–0.2)

## 2020-05-06 LAB — POC OCCULT BLOOD, ED: Fecal Occult Bld: POSITIVE — AB

## 2020-05-06 LAB — CK: Total CK: 91 U/L (ref 49–397)

## 2020-05-06 LAB — HEMOGLOBIN A1C
Hgb A1c MFr Bld: 7 % — ABNORMAL HIGH (ref 4.8–5.6)
Mean Plasma Glucose: 154.2 mg/dL

## 2020-05-06 MED ORDER — PSYLLIUM 95 % PO PACK
1.0000 | PACK | Freq: Every day | ORAL | Status: DC
  Administered 2020-05-06: 1 via ORAL
  Filled 2020-05-06 (×2): qty 1

## 2020-05-06 MED ORDER — LACTATED RINGERS IV BOLUS
500.0000 mL | Freq: Once | INTRAVENOUS | Status: AC
  Administered 2020-05-06: 500 mL via INTRAVENOUS

## 2020-05-06 MED ORDER — SODIUM ZIRCONIUM CYCLOSILICATE 10 G PO PACK
10.0000 g | PACK | Freq: Three times a day (TID) | ORAL | Status: DC
  Administered 2020-05-06 – 2020-05-08 (×6): 10 g via ORAL
  Filled 2020-05-06 (×5): qty 1

## 2020-05-06 MED ORDER — INSULIN ASPART 100 UNIT/ML ~~LOC~~ SOLN
8.0000 [IU] | Freq: Three times a day (TID) | SUBCUTANEOUS | Status: DC
  Administered 2020-05-06 – 2020-05-10 (×9): 8 [IU] via SUBCUTANEOUS

## 2020-05-06 MED ORDER — INSULIN ASPART 100 UNIT/ML ~~LOC~~ SOLN
0.0000 [IU] | Freq: Three times a day (TID) | SUBCUTANEOUS | Status: DC
  Administered 2020-05-06 (×2): 4 [IU] via SUBCUTANEOUS
  Administered 2020-05-07: 7 [IU] via SUBCUTANEOUS
  Administered 2020-05-07: 4 [IU] via SUBCUTANEOUS
  Administered 2020-05-08: 7 [IU] via SUBCUTANEOUS
  Administered 2020-05-08: 3 [IU] via SUBCUTANEOUS
  Administered 2020-05-09 (×3): 4 [IU] via SUBCUTANEOUS
  Administered 2020-05-10 (×2): 7 [IU] via SUBCUTANEOUS

## 2020-05-06 MED ORDER — SODIUM BICARBONATE 650 MG PO TABS
650.0000 mg | ORAL_TABLET | Freq: Three times a day (TID) | ORAL | Status: DC
  Administered 2020-05-06 – 2020-05-07 (×3): 650 mg via ORAL
  Filled 2020-05-06 (×4): qty 1

## 2020-05-06 MED ORDER — HEPARIN (PORCINE) 25000 UT/250ML-% IV SOLN
1700.0000 [IU]/h | INTRAVENOUS | Status: DC
  Administered 2020-05-06: 1300 [IU]/h via INTRAVENOUS
  Administered 2020-05-07: 1350 [IU]/h via INTRAVENOUS
  Filled 2020-05-06 (×3): qty 250

## 2020-05-06 MED ORDER — INSULIN GLARGINE 100 UNIT/ML ~~LOC~~ SOLN
20.0000 [IU] | Freq: Every day | SUBCUTANEOUS | Status: DC
  Administered 2020-05-06 – 2020-05-10 (×4): 20 [IU] via SUBCUTANEOUS
  Filled 2020-05-06 (×6): qty 0.2

## 2020-05-06 NOTE — ED Notes (Signed)
Lunch tray at bedside. ?

## 2020-05-06 NOTE — ED Notes (Signed)
Pt critical lab value, Potassium: 6.9  reported to attending

## 2020-05-06 NOTE — ED Notes (Signed)
Pt transferred to hospital bed for comfort.

## 2020-05-06 NOTE — Progress Notes (Signed)
ANTICOAGULATION CONSULT NOTE - Initial Consult  Pharmacy Consult for IV heparin Indication: afib, Eliquis on hold  Allergies  Allergen Reactions  . Tape Other (See Comments)    Causes skin irritation    Patient Measurements: Height: 6' (182.9 cm) Weight: 131.8 kg (290 lb 9.1 oz) IBW/kg (Calculated) : 77.6 Heparin Dosing Weight: 107 kg  Vital Signs: Temp: 98.5 F (36.9 C) (09/01 1951) Temp Source: Rectal (09/01 1951) BP: 117/54 (09/01 2315) Pulse Rate: 52 (09/01 2315)  Labs: Recent Labs    05/05/20 1349 05/05/20 2231  HGB 12.1* 10.9*  HCT 35.7* 32.0*  PLT PLATELET CLUMPS NOTED ON SMEAR, UNABLE TO ESTIMATE  --   CREATININE 3.31* 3.30*    Estimated Creatinine Clearance: 26.7 mL/min (A) (by C-G formula based on SCr of 3.3 mg/dL (H)).   Medical History: Past Medical History:  Diagnosis Date  . AICD (automatic cardioverter/defibrillator) present   . Anxiety   . Arthritis    "occasionally in my right hand" (04/02/2015)  . CAD in native artery 1999  . Cancer ([place])   . CHF (congestive heart failure) ([place])   . Depression   . GERD (gastroesophageal reflux disease)    "occasionally" (04/02/2015)  . History of hiatal hernia   . Hypercholesterolemia   . Hypertension   . Myocardial infarction ([place]) "several"  . OSA on CPAP   . PAF (paroxysmal atrial fibrillation) ([place])   . PTSD (post-traumatic stress disorder)   . Stroke ([place])    "I've had 2; my right leg & arm slightly weaker since" (04/02/2015)  . Type II diabetes mellitus ([facility])     Medications:  Infusions:  . sodium chloride    . heparin      Assessment: 74 yo male admitted with suspicion for GI bleed and AKI.  On Eliquis PTA, unable to confirm timing of last dose, however has been in ED close to 12 hrs at this point.   Pharmacy asked to begin IV heparin while Eliquis is on hold.  Platelets clumped on last CBC.  Goal of Therapy:  Heparin level 0.3-0.5 - will target lower end given ?GIB Monitor platelets by  anticoagulation protocol: Yes   Plan:  1. Check baseline CBC, aPTT and heparin level. 2. Start IV heparin at 1300 units/hr. 3. Check aPTT and heparin level 8 hrs after gtt starts. 4. Daily heparin level, aPTT and CBC.  [place] Crane, Roylene Reason, BCCP Clinical Pharmacist  05/06/2020 12:48 AM   [facility] pharmacy phone numbers are listed on [email or web address]

## 2020-05-06 NOTE — Consult Note (Signed)
Consultation Note Date: 05/06/2020   Patient Name: Vernon Briggs  DOB: 1946-04-07  MRN: [id number]  Age / Sex: 74 y.o., male  PCP: [name], MD Referring Physician: Oswald Hillock, MD  Reason for Consultation: Establishing goals of care  HPI/Patient Profile: 74 y.o. male  with past medical history of diabetes mellitus type 2, hypertension, stroke, CAD s/p CABG, CHF, CKD stage 3, atrial fibrillation admitted on 05/05/2020 with AKI on CKD stage 3, hyperkalemia, hypotension.   Patient faces treatment option decisions, advanced directive decisions, and anticipatory care needs.   Clinical Assessment and Goals of Care: I have reviewed medical records including EPIC notes, labs, and imaging. Received report from primary RN - no acute concerns.  Went to visit patient at bedside - no family present. He was sitting on the side of bed feeding himself dinner. He was awake, alert, oriented, and able to participate in conversation. Met with patient to discuss diagnosis, prognosis, GOC, EOL wishes, disposition, and options.  I introduced Palliative Medicine as specialized medical care for people living with serious illness. It focuses on providing relief from the symptoms and stress of a serious illness. The goal is to improve quality of life for both the patient and the family.  We discussed a brief life review of the patient. The patient is from [place], [place]. He has traveled all over the world through his career in the [employer]. He he and his wife have been together for 20+ years. Mr. Staver had two children; one unfortunately has passed away and he has not spoken to his living son in over 25 years. Mr. Bushey wife has been diagnosed with stage 4 breast cancer and he has been worried about her health.   As far as functional and nutritional status, prior to hospitalization, Mr. Kilpatrick lived with his wife in a  private residence. He was only able to ambulate short distances in the home. However, over the last 6 months he has noticed a decline in his functional status - he has fallen 5-6 times in the last 6 months. Home PT was set up for him - it was during their initial visit they found him to be hypotensive and sent him to the ED. He has been eating and drinking well, but states he has had chronic diarrhea for years (unsure exactly how long). Mr. Senft has a caregiver 3 days/week that helps with bathing and other small tasks. The patient relies heavily on the support of his wife at home. She manages his medications and cooks for him.    We discussed patient's current illness and what it means in the larger context of patient's on-going co-morbidities. The patient had a clear understanding of his current medical situation; however, when asked about his cancer history he stated he could not remember what kind, he "was diagnosed years ago" and "possibly kidney." He has been seen at the [facility] in [place] and unfortunately I am unable to look up his records to review. Natural disease trajectory and expectations at EOL were discussed.  I attempted to elicit values and goals of care important to the patient. The difference between aggressive medical intervention and comfort care was considered in light of the patient's goals of care. After discussion and consideration of his current medical situation, the patient is now willing to proceed with the colonoscopy offered by GI.   The patient is hopeful to return home when medically stable for discharge. He wants full scope care.   Advance directives, concepts specific to code status, artificial feeding and hydration, and rehospitalization were considered and discussed. The patient stated he decided about 10 years ago he wanted to be considered DNR/DNI. The patient does not have a Living Will or HCPOA - attempted to discuss topics and MOST form - the patient stated several times  that he has had conversations with his wife and "she knows." Attempted to elicit information about the conversations with his wife, but he repeated that "she knows." The patient stated when "it's time, it's time." Due to his wife's advanced cancer diagnosis, expressed that it would still be important to consider outlining his wishes via advanced directives to make his wishes known to others. The patient stated that his wife "is a Nurse, adult." He stated he wanted to review the MOST form with his wife - left blank form at bedside for his review.   Discussed with patient the importance of continued conversation with family and the medical providers regarding overall plan of care and treatment options, ensuring decisions are within the context of the patient's values and GOCs.    Palliative Care services outpatient were explained and offered - the patient declined.   Questions and concerns were addressed. The patient was encouraged to call with questions or concerns.    Primary Decision Maker: PATIENT    SUMMARY OF RECOMMENDATIONS   -Continue current full scope medical treatment -Continue DNR/DNI as previously documented -Patient is now agreeable to colonoscopy as offered by GI -Patient is hopeful to return home when medically stable for discharge. He declined outpatient Palliative Care services -Complete MOST form if patient becomes interested - blank form was left at bedside - PMT can assist -PMT will continue to follow peripherally. If there are any imminent needs please call the service directly   Code Status/Advance Care Planning:  DNR  Palliative Prophylaxis:   Aspiration, Bowel Regimen, Frequent Pain Assessment and Turn Reposition  Additional Recommendations (Limitations, Scope, Preferences):  Full Scope Treatment  Psycho-social/Spiritual:   Created space and opportunity for patient to express thoughts and feelings regarding patient's current medical situation.   Prognosis:    Unable to determine  Discharge Planning: To Be Determined      Primary Diagnoses: Present on Admission: . AKI (acute kidney injury) ([facility]) . Hyperkalemia . Type II diabetes mellitus with complication ([place]) . Essential hypertension . PAF (paroxysmal atrial fibrillation) ([place]) . Cardiomyopathy, ischemic . CKD (chronic kidney disease) stage 3, GFR 30-59 ml/min   I have reviewed the medical record, interviewed the patient and family, and examined the patient. The following aspects are pertinent.  Past Medical History:  Diagnosis Date  . AICD (automatic cardioverter/defibrillator) present   . Anxiety   . Arthritis    "occasionally in my right hand" (04/02/2015)  . CAD in native artery 1999  . Cancer ([facility])   . CHF (congestive heart failure) ([place])   . Depression   . GERD (gastroesophageal reflux disease)    "occasionally" (04/02/2015)  . History of hiatal hernia   . Hypercholesterolemia   . Hypertension   .  Myocardial infarction ([facility]) "several"  . OSA on CPAP   . PAF (paroxysmal atrial fibrillation) ([facility])   . PTSD (post-traumatic stress disorder)   . Stroke ([place] [place])    "I've had 2; my right leg & arm slightly weaker since" (04/02/2015)  . Type II diabetes mellitus ([facility])    Social History   Socioeconomic History  . Marital status: Married    Spouse name: Not on file  . Number of children: Not on file  . Years of education: Not on file  . Highest education level: Not on file  Occupational History  . Not on file  Tobacco Use  . Smoking status: Former Smoker    Packs/day: 0.50    Years: 1.00    Pack years: 0.50    Types: Cigarettes  . Smokeless tobacco: Never Used  . Tobacco comment: "quit smoking cigarettes in the 1960's"  Substance and Sexual Activity  . Alcohol use: Yes    Comment: 04/02/2015 "might have a beer and a mixed drink/month"  . Drug use: No  . Sexual activity: Yes  Other Topics Concern  . Not on file  Social History Narrative  . Not on file    Social Determinants of Health   Financial Resource Strain:   . Difficulty of Paying Living Expenses: Not on file  Food Insecurity:   . Worried About Charity fundraiser in the Last Year: Not on file  . Ran Out of Food in the Last Year: Not on file  Transportation Needs:   . Lack of Transportation (Medical): Not on file  . Lack of Transportation (Non-Medical): Not on file  Physical Activity:   . Days of Exercise per Week: Not on file  . Minutes of Exercise per Session: Not on file  Stress:   . Feeling of Stress : Not on file  Social Connections:   . Frequency of Communication with Friends and Family: Not on file  . Frequency of Social Gatherings with Friends and Family: Not on file  . Attends Religious Services: Not on file  . Active Member of Clubs or Organizations: Not on file  . Attends Archivist Meetings: Not on file  . Marital Status: Not on file   Family History  Family history unknown: Yes   Scheduled Meds: . insulin aspart  0-20 Units Subcutaneous TID WC  . insulin aspart  8 Units Subcutaneous TID WC  . insulin glargine  20 Units Subcutaneous QHS  . psyllium  1 packet Oral Daily  . sodium bicarbonate  650 mg Oral TID  . sodium zirconium cyclosilicate  10 g Oral TID   Continuous Infusions: . sodium chloride 150 mL/hr at 05/06/20 0212  . heparin 1,300 Units/hr (05/06/20 0334)   PRN Meds:.acetaminophen **OR** acetaminophen, ondansetron **OR** ondansetron (ZOFRAN) IV Medications Prior to Admission:  Prior to Admission medications   Medication Sig Start Date End Date Taking? Authorizing Provider  acetaminophen (TYLENOL) 325 MG tablet Take 2 tablets (650 mg total) by mouth every 4 (four) hours as needed for headache or mild pain. Patient taking differently: Take 325-650 mg by mouth every 4 (four) hours as needed for headache or mild pain.  09/15/14  Yes Kilroy, Doreene Burke, PA-C  apixaban (ELIQUIS) 5 MG TABS tablet Take 5 mg by mouth 2 (two) times daily.    Yes  [provider]  carvedilol (COREG) 3.125 MG tablet Take 1 tablet (3.125 mg total) by mouth 2 (two) times daily with a meal. 11/26/19 05/06/20 Yes Amin, Ankit  Chirag, MD  Cholecalciferol (VITAMIN D-3) 25 MCG (1000 UT) CAPS Take 1,000 Units by mouth daily.   Yes [provider]  clopidogrel (PLAVIX) 75 MG tablet Take 75 mg by mouth daily.   Yes [provider]  FLUoxetine (PROZAC) 20 MG capsule Take 40 mg by mouth at bedtime.    Yes [provider]  hydroxypropyl methylcellulose / hypromellose (ISOPTO TEARS / GONIOVISC) 2.5 % ophthalmic solution Place 1 drop into both eyes 2 (two) times daily as needed for dry eyes.    Yes [provider]  insulin regular human CONCENTRATED (HUMULIN R U-500 KWIKPEN) 500 UNIT/ML kwikpen Inject 55-110 Units into the skin See admin instructions. Inject 110 units into the skin in the morning before breakfast and 55 units at bedtime   Yes [provider]  isosorbide mononitrate (IMDUR) 30 MG 24 hr tablet Take 1 tablet (30 mg total) by mouth daily. 11/27/19  Yes Amin, Ankit Chirag, MD  ketoconazole (NIZORAL) 2 % cream Apply 1 application topically daily as needed for irritation.   Yes [provider]  lisinopril (ZESTRIL) 20 MG tablet Take 20 mg by mouth daily.   Yes [provider]  loperamide (IMODIUM A-D) 2 MG tablet Take 2 mg by mouth daily as needed for diarrhea or loose stools.    Yes [provider]  rosuvastatin (CRESTOR) 20 MG tablet Take 1 tablet (20 mg total) by mouth at bedtime. 11/26/19  Yes Amin, Jeanella Flattery, MD  spironolactone (ALDACTONE) 25 MG tablet Take 50 mg by mouth daily.   Yes [provider]  torsemide (DEMADEX) 20 MG tablet Take 40 mg by mouth in the morning.    Yes [provider]  nitroGLYCERIN (NITROSTAT) 0.4 MG SL tablet Place 1 tablet (0.4 mg total) under the tongue every 5 (five) minutes x 3 doses as needed for chest pain. Patient not taking: Reported  on 05/06/2020 09/15/14   Erlene Quan, PA-C   Allergies  Allergen Reactions  . Tape Other (See Comments)    Causes skin irritation   Review of Systems  Constitutional: Positive for fatigue.  Respiratory: Negative for shortness of breath.   Musculoskeletal: Positive for neck pain.  Neurological: Positive for weakness.  All other systems reviewed and are negative.   Physical Exam Vitals and nursing note reviewed.  Constitutional:      General: He is in acute distress.     Appearance: He is obese.  Pulmonary:     Effort: No respiratory distress.  Skin:    General: Skin is warm and dry.  Neurological:     Mental Status: He is alert and oriented to person, place, and time.     Motor: Weakness present.  Psychiatric:        Cognition and Memory: Cognition normal.     Vital Signs: BP 122/60   Pulse 76   Temp 98.5 F (36.9 C) (Rectal)   Resp (!) 22   Ht 6' (1.829 m)   Wt 131.8 kg   SpO2 100%   BMI 39.41 kg/m  Pain Scale: 0-10   Pain Score: 0-No pain   SpO2: SpO2: 100 % O2 Device:SpO2: 100 % O2 Flow Rate: .O2 Flow Rate (L/min): 2 L/min  IO: Intake/output summary:   Intake/Output Summary (Last 24 hours) at 05/06/2020 1633 Last data filed at 05/06/2020 0207 Gross per 24 hour  Intake 1500 ml  Output 400 ml  Net 1100 ml    LBM:   Baseline Weight: Weight: 131.8 kg  Most recent weight: Weight: 131.8 kg     Palliative Assessment/Data: PPS 50%     Time In: 1845 Time Out: 1955 Time Total: 70 minutes  Greater than 50%  of this time was spent counseling and coordinating care related to the above assessment and plan.  Signed by: Lin Landsman, NP   Please contact Palliative Medicine Team phone at [phone] for questions and concerns.  For individual provider: See Shea Evans

## 2020-05-06 NOTE — Progress Notes (Signed)
[facility] for IV heparin Indication: afib, Eliquis on hold  Allergies  Allergen Reactions  . Tape Other (See Comments)    Causes skin irritation    Patient Measurements: Height: 6' (182.9 cm) Weight: 131.8 kg (290 lb 9.1 oz) IBW/kg (Calculated) : 77.6 Heparin Dosing Weight: 107 kg  Vital Signs: Temp: 98.6 F (37 C) (09/02 2025) Temp Source: Oral (09/02 2025) BP: 139/63 (09/02 2025) Pulse Rate: 69 (09/02 2025)  Labs: Recent Labs    05/05/20 1349 05/05/20 1349 05/05/20 2231 05/06/20 0207 05/06/20 1125 05/06/20 1126 05/06/20 1857 05/06/20 2035  HGB 12.1*   < > 10.9* 11.3*  --   --   --   --   HCT 35.7*  --  32.0* 34.4*  --   --   --   --   PLT PLATELET CLUMPS NOTED ON SMEAR, UNABLE TO ESTIMATE  --   --  100*  --   --   --   --   APTT  --   --   --  31 68*  --   --  65*  HEPARINUNFRC  --   --   --  >2.20*  --  >2.20*  --   --   CREATININE 3.31*   < > 3.30*  --  2.55*  --  2.47*  --   CKTOTAL  --   --   --   --   --   --  91  --    < > = values in this interval not displayed.    Estimated Creatinine Clearance: 35.7 mL/min (A) (by C-G formula based on SCr of 2.47 mg/dL (H)).   Medical History: Past Medical History:  Diagnosis Date  . AICD (automatic cardioverter/defibrillator) present   . Anxiety   . Arthritis    "occasionally in my right hand" (04/02/2015)  . CAD in native artery 1999  . Cancer ([facility])   . CHF (congestive heart failure) ([place] [facility])   . Depression   . GERD (gastroesophageal reflux disease)    "occasionally" (04/02/2015)  . History of hiatal hernia   . Hypercholesterolemia   . Hypertension   . Myocardial infarction ([facility]) "several"  . OSA on CPAP   . PAF (paroxysmal atrial fibrillation) ([place])   . PTSD (post-traumatic stress disorder)   . Stroke ([facility])    "I've had 2; my right leg & arm slightly weaker since" (04/02/2015)  . Type II diabetes mellitus ([facility])     Medications:  Infusions:  . sodium chloride 100  mL/hr at 05/06/20 1637  . heparin 1,300 Units/hr (05/06/20 2135)    Assessment: 74 yo male admitted with suspicion for GI bleed and AKI.  On Eliquis PTA, unable to confirm timing of last dose, however has been in ED close to 12 hrs at this point.   Initial aPTT therapeutic at lower end 68 seconds, FOBT positive but H/H stable, plts 100, GI following  PTT came back slightly low this PM at 65. We will make a small increase and check a level in AM.   Goal of Therapy:  Heparin level 0.3-0.5 - will target lower end given ?GIB APTT 66-85 Monitor platelets by anticoagulation protocol: Yes   Plan:  Increase heparin gtt to 1350 units/hr Check PTT in AM  Onnie Boer, PharmD, BCIDP, AAHIVP, CPP Infectious Disease Pharmacist 05/06/2020 9:42 PM

## 2020-05-06 NOTE — Consult Note (Signed)
Nephrology [facility]  Requesting provider: Oswald Hillock, MD Reason for consult: aki, hyperkalemia   Assessment/Recommendations: Vernon Briggs is a 74 y.o. male with a past medical history as below presents with diarrhea, hypotension, AKI, and hyperkalemia.  AKI on CKD3 (improved): AKI likely secondary to pre-renal injury in the setting of diarrhea, improving with fluids. Peak Cr 3.3, now 2.6 -baseline Cr around 1.7-1.9 -check CK level -hold ACEI and MRA for now -will give slow infusion of 500cc of LR (cautious with fluids given HFrEF), overall looks euvolemic but still poor skin turgor -renal ultrasound without obstruction -Continue to monitor daily Cr, Dose meds for GFR<15 -Monitor Daily I/Os, Daily weight  -Maintain MAP>65 for optimal renal perfusion.  -Agree with holding ACE-I, avoid further nephrotoxins including NSAIDS, Morphine.  Unless absolutely necessary, avoid CT with contrast and/or MRI with gadolinium.     Hyperkalemia -likely related to dehydration -hold aldactone and lisinopril -nahco3 1300mg  tid -IVF as above -lokelma 10g TID -restrict K in diet -CK and VBG/whole blood K ordered  Metabolic acidosis, non-anion gap -likely related to diarrhea, will supplement given hyperkalemia -lactate WNL -can utilize LR for volume resuscitation  Renal cyst -lower pole right kidney has a partially exophytic cyst with thin internal septation and questionable mural nodularity. Will need further evaluation with a MRI with and without contrast (2nd or 3rd contrast agent) which can be done as an outpatient  Hypertension: -relatively hypotensive, hold anti-htn's for now. If needed can add back meds slowly would start with Coreg first  Anemia due to chronic kidney disease: -Transfuse for Hgb<7 g/dL -hgb at goal, monitor for now  Diarrhea -GI on board, patient refuses c-scope  Uncontrolled Diabetes Mellitus Type 2 with Hyperglycemia -mgmt per primary  service  HFrEF s/p AICD, EF 25-30% -hold diuretics for now, caution with IVF, seems to be more on the hypovolemic side  Recommendations conveyed to primary service.   [facility] 05/06/2020 1:20 PM   _____________________________________________________________________________________   History of Present Illness: Vernon Briggs is a/an 74 y.o. male with a past medical history of CKD 3, coronary disease status post CABG, HFrEF (EF 25 to 30%) s/p AICD, diabetes mellitus type 2, CVA/vascular dementia, hypertension, paroxysmal A. fib who presents to [facility] with diarrhea.  His diarrhea has been an ongoing issue for several years however had been intermittently hypotensive in the past couple weeks (systolic as low as 60).  Per his home health he was instructed to come in. Cr on presentation was 3.3 with K of 7.2. K improved with 1.5L of NS down to 5.9. He does feel overall weak and reports that he is urinating a lot. He reports that this is the first time he's able to sit up in a long time. He does report a good appetite, denies n/v, chest pain, SOB, dizziness, dysgeusia.  Medications:  Current Facility-Administered Medications  Medication Dose Route Frequency Provider Last Rate Last Admin  . 0.9 %  sodium chloride infusion   Intravenous Continuous Oswald Hillock, MD 150 mL/hr at 05/06/20 1610 New Bag at 05/06/20 0212  . acetaminophen (TYLENOL) tablet 650 mg  650 mg Oral Q6H PRN Etta Quill, DO       Or  . acetaminophen (TYLENOL) suppository 650 mg  650 mg Rectal Q6H PRN Etta Quill, DO      . heparin ADULT infusion 100 units/mL (25000 units/27mL sodium chloride 0.45%)  1,300 Units/hr Intravenous Continuous Carney, Gay Filler, RPH 13 mL/hr at  05/06/20 0334 1,300 Units/hr at 05/06/20 0334  . insulin aspart (novoLOG) injection 0-20 Units  0-20 Units Subcutaneous TID WC Etta Quill, DO      . insulin aspart (novoLOG) injection 8 Units  8 Units Subcutaneous  TID WC Etta Quill, DO      . insulin glargine (LANTUS) injection 20 Units  20 Units Subcutaneous QHS Etta Quill, DO   20 Units at 05/06/20 0208  . ondansetron (ZOFRAN) tablet 4 mg  4 mg Oral Q6H PRN Etta Quill, DO       Or  . ondansetron [facility]) injection 4 mg  4 mg Intravenous Q6H PRN Etta Quill, DO      . psyllium (HYDROCIL/METAMUCIL) 1 packet  1 packet Oral Daily Vena Rua, PA-C       Current Outpatient Medications  Medication Sig Dispense Refill  . acetaminophen (TYLENOL) 325 MG tablet Take 2 tablets (650 mg total) by mouth every 4 (four) hours as needed for headache or mild pain. (Patient taking differently: Take 325-650 mg by mouth every 4 (four) hours as needed for headache or mild pain. )    . apixaban (ELIQUIS) 5 MG TABS tablet Take 5 mg by mouth 2 (two) times daily.     . carvedilol (COREG) 3.125 MG tablet Take 1 tablet (3.125 mg total) by mouth 2 (two) times daily with a meal. 60 tablet 0  . Cholecalciferol (VITAMIN D-3) 25 MCG (1000 UT) CAPS Take 1,000 Units by mouth daily.    . clopidogrel (PLAVIX) 75 MG tablet Take 75 mg by mouth daily.    Marland Kitchen FLUoxetine (PROZAC) 20 MG capsule Take 40 mg by mouth at bedtime.     . hydroxypropyl methylcellulose / hypromellose (ISOPTO TEARS / GONIOVISC) 2.5 % ophthalmic solution Place 1 drop into both eyes 2 (two) times daily as needed for dry eyes.     . insulin regular human CONCENTRATED (HUMULIN R U-500 KWIKPEN) 500 UNIT/ML kwikpen Inject 55-110 Units into the skin See admin instructions. Inject 110 units into the skin in the morning before breakfast and 55 units at bedtime    . isosorbide mononitrate (IMDUR) 30 MG 24 hr tablet Take 1 tablet (30 mg total) by mouth daily. 30 tablet 0  . ketoconazole (NIZORAL) 2 % cream Apply 1 application topically daily as needed for irritation.    Marland Kitchen lisinopril (ZESTRIL) 20 MG tablet Take 20 mg by mouth daily.    Marland Kitchen loperamide (IMODIUM A-D) 2 MG tablet Take 2 mg by mouth daily as needed  for diarrhea or loose stools.     . rosuvastatin (CRESTOR) 20 MG tablet Take 1 tablet (20 mg total) by mouth at bedtime. 30 tablet 0  . spironolactone (ALDACTONE) 25 MG tablet Take 50 mg by mouth daily.    Marland Kitchen torsemide (DEMADEX) 20 MG tablet Take 40 mg by mouth in the morning.     . nitroGLYCERIN (NITROSTAT) 0.4 MG SL tablet Place 1 tablet (0.4 mg total) under the tongue every 5 (five) minutes x 3 doses as needed for chest pain. (Patient not taking: Reported on 05/06/2020) 25 tablet 2     ALLERGIES Tape  MEDICAL HISTORY Past Medical History:  Diagnosis Date  . AICD (automatic cardioverter/defibrillator) present   . Anxiety   . Arthritis    "occasionally in my right hand" (04/02/2015)  . CAD in native artery 1999  . Cancer ([place])   . CHF (congestive heart failure) ([place])   . Depression   .  GERD (gastroesophageal reflux disease)    "occasionally" (04/02/2015)  . History of hiatal hernia   . Hypercholesterolemia   . Hypertension   . Myocardial infarction ([place]) "several"  . OSA on CPAP   . PAF (paroxysmal atrial fibrillation) ([place])   . PTSD (post-traumatic stress disorder)   . Stroke ([place])    "I've had 2; my right leg & arm slightly weaker since" (04/02/2015)  . Type II diabetes mellitus ([place])      SOCIAL HISTORY Social History   Socioeconomic History  . Marital status: Married    Spouse name: Not on file  . Number of children: Not on file  . Years of education: Not on file  . Highest education level: Not on file  Occupational History  . Not on file  Tobacco Use  . Smoking status: Former Smoker    Packs/day: 0.50    Years: 1.00    Pack years: 0.50    Types: Cigarettes  . Smokeless tobacco: Never Used  . Tobacco comment: "quit smoking cigarettes in the 1960's"  Substance and Sexual Activity  . Alcohol use: Yes    Comment: 04/02/2015 "might have a beer and a mixed drink/month"  . Drug use: No  . Sexual activity: Yes  Other Topics Concern  . Not on file  Social History  Narrative  . Not on file   Social Determinants of Health   Financial Resource Strain:   . Difficulty of Paying Living Expenses: Not on file  Food Insecurity:   . Worried About Charity fundraiser in the Last Year: Not on file  . Ran Out of Food in the Last Year: Not on file  Transportation Needs:   . Lack of Transportation (Medical): Not on file  . Lack of Transportation (Non-Medical): Not on file  Physical Activity:   . Days of Exercise per Week: Not on file  . Minutes of Exercise per Session: Not on file  Stress:   . Feeling of Stress : Not on file  Social Connections:   . Frequency of Communication with Friends and Family: Not on file  . Frequency of Social Gatherings with Friends and Family: Not on file  . Attends Religious Services: Not on file  . Active Member of Clubs or Organizations: Not on file  . Attends Archivist Meetings: Not on file  . Marital Status: Not on file  Intimate Partner Violence:   . Fear of Current or Ex-Partner: Not on file  . Emotionally Abused: Not on file  . Physically Abused: Not on file  . Sexually Abused: Not on file     FAMILY HISTORY Family History  Family history unknown: Yes     Review of Systems: 12 systems reviewed Otherwise as per HPI, all other systems reviewed and negative  Physical Exam: Vitals:   05/06/20 1000 05/06/20 1030  BP: 133/65 131/71  Pulse: 72 70  Resp: (!) 22 (!) 25  Temp:    SpO2: 100% 100%   No intake/output data recorded.  Intake/Output Summary (Last 24 hours) at 05/06/2020 1320 Last data filed at 05/06/2020 0207 Gross per 24 hour  Intake 1500 ml  Output 400 ml  Net 1100 ml   General: tired-appearing, no acute distress HEENT: anicteric sclera, oropharynx clear without lesions CV: regular rate, normal rhythm, no murmurs, no gallops, no rubs, AICD site left chest wall c/d/i Lungs: clear to auscultation bilaterally, normal work of breathing, bilateral chest expansion, no w/r/r/c Abd: obese,  soft, non-tender, non-distended Skin:  no visible lesions or rashes Psych: alert, engaged, appropriate mood and affect Musculoskeletal: no edema, no obvious deformities Neuro: normal speech, no gross focal deficits   Test Results Reviewed Lab Results  Component Value Date   NA 136 05/06/2020   K 6.9 (HH) 05/06/2020   CL 110 05/06/2020   CO2 13 (L) 05/06/2020   BUN 86 (H) 05/06/2020   CREATININE 2.55 (H) 05/06/2020   CALCIUM 8.9 05/06/2020   ALBUMIN 4.0 05/05/2020     I have reviewed all relevant outside healthcare records related to the patient's kidney injury.

## 2020-05-06 NOTE — Progress Notes (Signed)
[place] for IV heparin Indication: afib, Eliquis on hold  Allergies  Allergen Reactions  . Tape Other (See Comments)    Causes skin irritation    Patient Measurements: Height: 6' (182.9 cm) Weight: 131.8 kg (290 lb 9.1 oz) IBW/kg (Calculated) : 77.6 Heparin Dosing Weight: 107 kg  Vital Signs: BP: 131/71 (09/02 1030) Pulse Rate: 70 (09/02 1030)  Labs: Recent Labs    05/05/20 1349 05/05/20 1349 05/05/20 2231 05/06/20 0207 05/06/20 1125 05/06/20 1126  HGB 12.1*   < > 10.9* 11.3*  --   --   HCT 35.7*  --  32.0* 34.4*  --   --   PLT PLATELET CLUMPS NOTED ON SMEAR, UNABLE TO ESTIMATE  --   --  100*  --   --   APTT  --   --   --  31 68*  --   HEPARINUNFRC  --   --   --  >2.20*  --  >2.20*  CREATININE 3.31*  --  3.30*  --  2.55*  --    < > = values in this interval not displayed.    Estimated Creatinine Clearance: 34.6 mL/min (A) (by C-G formula based on SCr of 2.55 mg/dL (H)).   Medical History: Past Medical History:  Diagnosis Date  . AICD (automatic cardioverter/defibrillator) present   . Anxiety   . Arthritis    "occasionally in my right hand" (04/02/2015)  . CAD in native artery 1999  . Cancer ([place])   . CHF (congestive heart failure) ([place])   . Depression   . GERD (gastroesophageal reflux disease)    "occasionally" (04/02/2015)  . History of hiatal hernia   . Hypercholesterolemia   . Hypertension   . Myocardial infarction ([place]) "several"  . OSA on CPAP   . PAF (paroxysmal atrial fibrillation) ([place])   . PTSD (post-traumatic stress disorder)   . Stroke ([place])    "I've had 2; my right leg & arm slightly weaker since" (04/02/2015)  . Type II diabetes mellitus ([facility])     Medications:  Infusions:  . sodium chloride 150 mL/hr at 05/06/20 0212  . heparin 1,300 Units/hr (05/06/20 0334)    Assessment: 74 yo male admitted with suspicion for GI bleed and AKI.  On Eliquis PTA, unable to confirm timing of last dose, however has  been in ED close to 12 hrs at this point.   Initial aPTT therapeutic at lower end 68 seconds, FOBT positive but H/H stable, plts 100, GI following  Goal of Therapy:  Heparin level 0.3-0.5 - will target lower end given ?GIB Monitor platelets by anticoagulation protocol: Yes   Plan:  Continue heparin gtt at 1300 units/hr F/u 8 hour aPTT to confirm, GI recs   Bertis Ruddy, PharmD Clinical Pharmacist ED Pharmacist Phone # [phone] 05/06/2020 1:06 PM

## 2020-05-06 NOTE — Progress Notes (Addendum)
[facility]  PROGRESS NOTE  Vernon Briggs UEA:[id number] DOB: June 18, 1946 DOA: 05/05/2020 PCP: [name], MD   Brief HPI:   74 year old male with medical history of stroke, CAD s/p CABG, HFr EF, 25 to 30%, status post AICD, CKD stage III, diabetes mellitus type 2, hypertension, paroxysmal atrial fibrillation on Eliquis was brought to hospital for hypotension and diarrhea.  Patient has history of chronic diarrhea for several years usually gets 1-3 stools per day.  Over the past few days he has been intermittently hypotensive.  Home health RN noted that he was hypotensive with SBP 60s at home and called EMS. In the ED patient's creatinine was found to be 3.3 up from 1.8 at baseline.  Initial potassium was 7.2 which improved to 5.9 with IV fluid bolus.   Subjective   Patient seen and examined, feels tired.  He has been refusing labs since this morning.   Assessment/Plan:     1. Acute kidney injury on CKD stage III-likely prerenal from dehydration from ongoing diarrhea.  Renal ultrasound shows no hydronephrosis, large partially exophytic cyst arising from the lower pole right kidney demonstrates some thin internal septation and questionable mural nodularity.  No definitive evaluation from MR imaging on a nonemergent basis can be done as outpatient.  Creatinine is 3.30 today.  Follow BMP in a.m. 2. Chronic diarrhea/positive FOBT-patient has history of chronic diarrhea 1-3 loose stools daily for past 20 years.  He had colonoscopy 10 years ago, does not want another colonoscopy.  Never had formal GI work-up.  Will consult gastroenterology for further recommendations. 3. Hyperkalemia-potassium was elevated yesterday at 7.2.  Potassium improved to 5.9 with IV fluids.  Potassium is 6.9 this morning.  Will consult nephrology for further management.   4. Diabetes mellitus type 2-CBG well controlled, continue Lantus 20 units subcu daily, NovoLog 8 units 3 times daily meal coverage, resistant  sliding scale insulin with NovoLog. 5. PAF-continue heparin GTT for anticoagulation, continue Coreg.  Heart rate is well controlled. 6. Hypertension-diuretics currently on hold.     COVID-19 Labs  No results for input(s): DDIMER, FERRITIN, LDH, CRP in the last 72 hours.  Lab Results  Component Value Date   SARSCOV2NAA NEGATIVE 05/05/2020   [place] NEGATIVE 11/19/2019     Scheduled medications:   . insulin aspart  0-20 Units Subcutaneous TID WC  . insulin aspart  8 Units Subcutaneous TID WC  . insulin glargine  20 Units Subcutaneous QHS  . psyllium  1 packet Oral Daily         CBG: Recent Labs  Lab 05/06/20 0813  GLUCAP 116*    SpO2: 100 % O2 Flow Rate (L/min): 2 L/min    CBC: Recent Labs  Lab 05/05/20 1349 05/05/20 2231 05/06/20 0207  WBC 9.3  --  9.1  NEUTROABS 6.7  --   --   HGB 12.1* 10.9* 11.3*  HCT 35.7* 32.0* 34.4*  MCV 99.2  --  99.7  PLT PLATELET CLUMPS NOTED ON SMEAR, UNABLE TO ESTIMATE  --  100*    Basic Metabolic Panel: Recent Labs  Lab 05/05/20 1349 05/05/20 2231  NA 135 137  K 7.2* 5.9*  CL 105 110  CO2 19*  --   GLUCOSE 136* 72  BUN 100* 119*  CREATININE 3.31* 3.30*  CALCIUM 9.6  --      Liver Function Tests: Recent Labs  Lab 05/05/20 1349  AST 31  ALT 31  ALKPHOS 57  BILITOT 1.1  PROT 6.8  ALBUMIN 4.0  Antibiotics: Anti-infectives (From admission, onward)   None       DVT prophylaxis: Heparin  Code Status: DNR  Family Communication: No family at bedside    Status is: Inpatient  Dispo: The patient is from: Home              Anticipated d/c is to: Home              Anticipated d/c date is: 05/09/2020              Patient currently not medically stable for discharge  Barrier to discharge-ongoing diarrhea, hypotension, hyperkalemia          Consultants:    Procedures:     Objective   Vitals:   05/06/20 0615 05/06/20 0900 05/06/20 1000 05/06/20 1030  BP: (!) 147/66 (!) 128/59  133/65 131/71  Pulse: 73 63 72 70  Resp: (!) 21 19 (!) 22 (!) 25  Temp:      TempSrc:      SpO2: 100% 100% 100% 100%  Weight:      Height:        Intake/Output Summary (Last 24 hours) at 05/06/2020 1238 Last data filed at 05/06/2020 0207 Gross per 24 hour  Intake 1500 ml  Output 400 ml  Net 1100 ml    08/31 1901 - 09/02 0700 In: 1500  Out: 400 [Urine:400]  Filed Weights   05/05/20 1328  Weight: 131.8 kg    Physical Examination:    General: Appears in no acute distress  Cardiovascular: S1-S2, regular  Respiratory: Clear to auscultation bilaterally, no wheezing or crackles  Abdomen: Abdomen is soft, no organomegaly  Extremities: No edema in the lower extremities  Neurologic: , Oriented x3, intact insight and judgment, no focal deficit noted    Data Reviewed:   Recent Results (from the past 240 hour(s))  Blood culture (routine x 2)     Status: None (Preliminary result)   Collection Time: 05/05/20  7:20 PM   Specimen: BLOOD  Result Value Ref Range Status   Specimen Description BLOOD LEFT ANTECUBITAL  Final   Special Requests   Final    BOTTLES DRAWN AEROBIC AND ANAEROBIC Blood Culture results may not be optimal due to an excessive volume of blood received in culture bottles   Culture   Final    NO GROWTH < 12 HOURS Performed at [facility], [place] [address], [place], [place] [address]    Report Status PENDING  Incomplete  Blood culture (routine x 2)     Status: None (Preliminary result)   Collection Time: 05/05/20  7:28 PM   Specimen: BLOOD RIGHT FOREARM  Result Value Ref Range Status   Specimen Description BLOOD RIGHT FOREARM  Final   Special Requests   Final    BOTTLES DRAWN AEROBIC AND ANAEROBIC Blood Culture adequate volume   Culture   Final    NO GROWTH < 12 HOURS Performed at [facility], [place] [address] [place], [place] [address]    Report Status PENDING  Incomplete  SARS Coronavirus 2 by RT PCR (hospital order, performed in [facility] hospital lab) Nasopharyngeal Nasopharyngeal Swab     Status: None   Collection Time: 05/05/20  7:32 PM   Specimen: Nasopharyngeal Swab  Result Value Ref Range Status   SARS Coronavirus 2 NEGATIVE NEGATIVE Final    Comment: (NOTE) SARS-CoV-2 target nucleic acids are NOT DETECTED.  The SARS-CoV-2 RNA is generally detectable in upper and lower respiratory  specimens during the acute phase of infection. The lowest concentration of SARS-CoV-2 viral copies this assay can detect is 250 copies / mL. A negative result does not preclude SARS-CoV-2 infection and should not be used as the sole basis for treatment or other patient management decisions.  A negative result may occur with improper specimen collection / handling, submission of specimen other than nasopharyngeal swab, presence of viral mutation(s) within the areas targeted by this assay, and inadequate number of viral copies (<250 copies / mL). A negative result must be combined with clinical observations, patient history, and epidemiological information.  Fact Sheet for Patients:   StrictlyIdeas.no  Fact Sheet for Healthcare Providers: BankingDealers.co.za  This test is not yet approved or  cleared by the [place] FDA and has been authorized for detection and/or diagnosis of SARS-CoV-2 by FDA under an Emergency Use Authorization (EUA).  This EUA will remain in effect (meaning this test can be used) for the duration of the COVID-19 declaration under Section 564(b)(1) of the Act, 21 U.S.C. section 360bbb-3(b)(1), unless the authorization is terminated or revoked sooner.  Performed at [facility], [place] [address] [place], [place] [address]     No results for input(s): LIPASE, AMYLASE in the last 168 hours. No results for input(s): AMMONIA in the last 168 hours.  Cardiac Enzymes: No results for input(s): CKTOTAL, CKMB, CKMBINDEX, TROPONINI in the last 168  hours. BNP (last 3 results) No results for input(s): BNP in the last 8760 hours.  ProBNP (last 3 results) No results for input(s): PROBNP in the last 8760 hours.  Studies:  US RENAL  Result Date: May 29, 2020 CLINICAL DATA:  Acute kidney injury EXAM: RENAL / URINARY TRACT ULTRASOUND COMPLETE COMPARISON:  None. FINDINGS: Right Kidney: Renal measurements: 11.8 x 6.3 x 4.9 cm = volume: 189 mL. 3.9 x 2.3 x 1.7 cm anechoic parapelvic cyst in the interpolar right kidney. Additional partially exophytic 4.4 x 3.0 x 3.7 cm cyst with some internal septation and a questionable mural nodule (image [id number]). Echogenicity is within normal limits. No other concerning renal mass, shadowing calculus or hydronephrosis. Left Kidney: Renal measurements: 12.4 x 5.9 x 5.0 cm = volume: 193 mL. 2.4 x 1.5 x 2.0 cm anechoic parapelvic cyst in the interpolar left kidney. Echogenicity is within normal limits. No concerning renal mass, shadowing calculus or hydronephrosis. Bladder: Prevoid volume of 284 mL. Bladder appears normal for the degree of distention though patient states they feel unable to void at the time of examination. Other: None. IMPRESSION: Bilateral renal cysts. A larger partially exophytic cyst arising from the lower pole right kidney demonstrates some thin internal septation and questionable mural nodularity. More definitive evaluation could be made with MR imaging on a nonemergent basis especially if patient would better tolerate contrast media following resolution of the acute kidney injury. Significant prevoid volume with inability to void further. Could correlate for symptoms of outlet obstruction. Otherwise unremarkable ultrasound of the urinary tract. Electronically Signed   By: Lovena Le M.D.   On: 05-29-2020 00:09   DG Chest Port 1 View  Result Date: 05/05/2020 CLINICAL DATA:  Hypotension EXAM: PORTABLE CHEST 1 VIEW COMPARISON:  11/19/2019 FINDINGS: Post sternotomy changes. Left-sided pacing device as  before. No focal opacity or pleural effusion. Stable enlarged cardiomediastinal silhouette. No pneumothorax. IMPRESSION: No active disease. Cardiomegaly. Electronically Signed   By: Donavan Foil M.D.   On: 05/05/2020 19:56       [place]   [facility] If 7PM-7AM, please  contact night-coverage at [email or web address], Office  [phone]   05/06/2020, 12:38 PM  LOS: 1 day

## 2020-05-06 NOTE — Consult Note (Addendum)
[facility] Consult: 10:31 AM 05/06/2020  LOS: 1 day    Referring Provider: Dr Darrick Meigs  Primary Care Physician:  [name], MD at [facility] in [facility] Primary Gastroenterologist:  Althia Forts pt     Reason for Consultation:  diarrhea   HPI: Vernon Briggs is a 74 y.o. male.  PMH listed below.   On Eliquis, Plavix for CVA, CAD, PAF.  CABG 1999, stenting  2016.  NSTEMI w DES stent 11/2019.  S/p AICD for CHF, ischemic CM.  EF 25 to 30%.  CKD 3.   IDDM.  OSA on CPAP.  Thrombocytopenia, chronic dating to 2008. 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   Several months diarrhea.  A few weeks of intermittent hypotension.  Dizziness PTA.  Home health aid directed pt to ED for eval hypotension.     FOBT +.  Stool PCR panel is pending.  Renal ultrasound: large exophytic cyst R kidney, would be best evald w MRI.  bil renal cysts.    Pt and wife report years of intermittent loose, watery, incontinent, non-bloody stools.  Anywhere from 1 to at most 3 episodes per 24 hours.  Relieved w imodium that results in no BM's for anywhere from 1 to 3 days.  No abd pain, no anorexia, no heartburn.  Diarrhea not worked up by PMD.  Had colonoscopy w "2 large polyps" > 10 yrs ago but refuses to have another colon since jesperiencing pain w previous study.  Also due to signif cardiac issues, fup was delayed in past.     K 7.2 >> 5.9.   BUN/creat 110/3.3 >> 119/3.3.   LFTs including albumin are wnl.   Hgb 11.3 post hydration.  WBCs 9.3.  Platelets 100 (60s in 11/2019)  No family hx colon cancer.    Lives w wife who has stage 4 breast cancer.  Neither she or pt are vaxed for Covid.     Past Medical History:  Diagnosis Date  . AICD (automatic cardioverter/defibrillator) present   . Anxiety   . Arthritis    "occasionally in my right  hand" (04/02/2015)  . CAD in native artery 1999  . Cancer ([place])   . CHF (congestive heart failure) ([place])   . Depression   . GERD (gastroesophageal reflux disease)    "occasionally" (04/02/2015)  . History of hiatal hernia   . Hypercholesterolemia   . Hypertension   . Myocardial infarction ([place]) "several"  . OSA on CPAP   . PAF (paroxysmal atrial fibrillation) ([place])   . PTSD (post-traumatic stress disorder)   . Stroke ([place])    "I've had 2; my right leg & arm slightly weaker since" (04/02/2015)  . Type II diabetes mellitus ([place])     Past Surgical History:  Procedure Laterality Date  . CARDIAC DEFIBRILLATOR PLACEMENT     [place]  . CORONARY ANGIOPLASTY WITH STENT PLACEMENT     "I've got 7 stents total" (04/02/2015)  . CORONARY ARTERY BYPASS GRAFT  1999   CABG X4; LIMA-LAD, SVG-OM1, SVG-RI, SVG-dRCA  . CORONARY STENT INTERVENTION N/A 11/24/2019  Procedure: CORONARY STENT INTERVENTION;  Surgeon: [name], MD;  Location: [facility];  Service: Cardiovascular;  Laterality: N/A;  . INTRAVASCULAR ULTRASOUND/IVUS N/A 11/24/2019   Procedure: Intravascular Ultrasound/IVUS;  Surgeon: [name], MD;  Location: [facility];  Service: Cardiovascular;  Laterality: N/A;  . LEFT HEART CATH N/A 11/24/2019   Procedure: Left Heart Cath;  Surgeon: [name], MD;  Location: [name];  Service: Cardiovascular;  Laterality: N/A;  . LEFT HEART CATH AND CORS/GRAFTS ANGIOGRAPHY N/A 11/21/2019   Procedure: LEFT HEART CATH AND CORS/GRAFTS ANGIOGRAPHY;  Surgeon: Burnell Blanks, MD;  Location: [facility];  Service: Cardiovascular;  Laterality: N/A;  . LEFT HEART CATHETERIZATION WITH CORONARY/GRAFT ANGIOGRAM N/A 09/14/2014   Procedure: LEFT HEART CATHETERIZATION WITH Beatrix Fetters;  Surgeon: Leonie Man, MD;  Location[facility] C[facility];  Service: Cardiovascular;  Laterality: N/A;  . PERCUTANEOUS CORONARY STENT INTERVENTION (PCI-S) Left 09/14/2014     Procedure: PERCUTANEOUS CORONARY STENT INTERVENTION (PCI-S);  Surgeon: Leonie Man, MD;  Location: [facility] CATH LAB;  Service: Cardiovascular;  Laterality: Left;    Prior to Admission medications   Medication Sig Start Date End Date Taking? Authorizing Provider  acetaminophen (TYLENOL) 325 MG tablet Take 2 tablets (650 mg total) by mouth every 4 (four) hours as needed for headache or mild pain. Patient taking differently: Take 325-650 mg by mouth every 4 (four) hours as needed for headache or mild pain.  09/15/14  Yes Kilroy, Doreene Burke, PA-C  apixaban (ELIQUIS) 5 MG TABS tablet Take 5 mg by mouth 2 (two) times daily.    Yes [provider]  carvedilol (COREG) 3.125 MG tablet Take 1 tablet (3.125 mg total) by mouth 2 (two) times daily with a meal. 11/26/19 05/06/20 Yes Amin, Ankit Chirag, MD  Cholecalciferol (VITAMIN D-3) 25 MCG (1000 UT) CAPS Take 1,000 Units by mouth daily.   Yes [provider]  clopidogrel (PLAVIX) 75 MG tablet Take 75 mg by mouth daily.   Yes [provider]  FLUoxetine (PROZAC) 20 MG capsule Take 40 mg by mouth at bedtime.    Yes [provider]  hydroxypropyl methylcellulose / hypromellose (ISOPTO TEARS / GONIOVISC) 2.5 % ophthalmic solution Place 1 drop into both eyes 2 (two) times daily as needed for dry eyes.    Yes [provider]  insulin regular human CONCENTRATED (HUMULIN R U-500 KWIKPEN) 500 UNIT/ML kwikpen Inject 55-110 Units into the skin See admin instructions. Inject 110 units into the skin in the morning before breakfast and 55 units at bedtime   Yes [provider]  isosorbide mononitrate (IMDUR) 30 MG 24 hr tablet Take 1 tablet (30 mg total) by mouth daily. 11/27/19  Yes Amin, Ankit Chirag, MD  ketoconazole (NIZORAL) 2 % cream Apply 1 application topically daily as needed for irritation.   Yes [provider]  lisinopril (ZESTRIL) 20 MG tablet Take 20 mg by mouth daily.   Yes [provider]   loperamide (IMODIUM A-D) 2 MG tablet Take 2 mg by mouth daily as needed for diarrhea or loose stools.    Yes [provider]  rosuvastatin (CRESTOR) 20 MG tablet Take 1 tablet (20 mg total) by mouth at bedtime. 11/26/19  Yes Amin, Jeanella Flattery, MD  spironolactone (ALDACTONE) 25 MG tablet Take 50 mg by mouth daily.   Yes [provider]  torsemide (DEMADEX) 20 MG tablet Take 40 mg by mouth in the morning.    Yes [provider]  nitroGLYCERIN (NITROSTAT) 0.4 MG SL tablet Place 1 tablet (0.4 mg total) under the tongue every 5 (five) minutes x 3 doses as needed for chest pain. Patient not taking: Reported on 05/06/2020 09/15/14   Erlene Quan, PA-C    Scheduled Meds: . insulin aspart  0-20 Units Subcutaneous TID WC  . insulin aspart  8 Units Subcutaneous TID WC  . insulin glargine  20 Units Subcutaneous QHS   Infusions: . sodium chloride 150 mL/hr at 05/06/20 7616  . heparin 1,300 Units/hr (05/06/20 0334)   PRN Meds: acetaminophen **OR** acetaminophen, ondansetron **OR** ondansetron (ZOFRAN) IV   Allergies as of 05/05/2020 - Review Complete 05/05/2020  Allergen Reaction Noted  . Tape Other (See Comments) 11/20/2019    Family History  Family history unknown: Yes    Social History   Socioeconomic History  . Marital status: Married    Spouse name: Not on file  . Number of children: Not on file  . Years of education: Not on file  . Highest education level: Not on file  Occupational History  . Not on file  Tobacco Use  . Smoking status: Former Smoker    Packs/day: 0.50    Years: 1.00    Pack years: 0.50    Types: Cigarettes  . Smokeless tobacco: Never Used  . Tobacco comment: "quit smoking cigarettes in the 1960's"  Substance and Sexual Activity  . Alcohol use: Yes    Comment: 04/02/2015 "might have a beer and a mixed drink/month"  . Drug use: No  . Sexual activity: Yes  Other Topics Concern  . Not on file  Social History Narrative  . Not on  file   Social Determinants of Health   Financial Resource Strain:   . Difficulty of Paying Living Expenses: Not on file  Food Insecurity:   . Worried About Charity fundraiser in the Last Year: Not on file  . Ran Out of Food in the Last Year: Not on file  Transportation Needs:   . Lack of Transportation (Medical): Not on file  . Lack of Transportation (Non-Medical): Not on file  Physical Activity:   . Days of Exercise per Week: Not on file  . Minutes of Exercise per Session: Not on file  Stress:   . Feeling of Stress : Not on file  Social Connections:   . Frequency of Communication with Friends and Family: Not on file  . Frequency of Social Gatherings with Friends and Family: Not on file  . Attends Religious Services: Not on file  . Active Member of Clubs or Organizations: Not on file  . Attends Archivist Meetings: Not on file  . Marital Status: Not on file  Intimate Partner Violence:   . Fear of Current or Ex-Partner: Not on file  . Emotionally Abused: Not on file  . Physically Abused: Not on file  . Sexually Abused: Not on file    REVIEW OF SYSTEMS: Constitutional: Baseline very inactive. Can walk around the house but if he leaves the house he uses an electric wheelchair. ENT:  No nose bleeds Pulm: Currently not short of breath. No cough. CV:  No palpitations, no LE edema. No angina. GU:  No hematuria, no frequency GI: See HPI. Heme: No unusual or excessive bleeding or bruising Transfusions: None Neuro:  No headaches, no peripheral tingling or numbness. No dizziness, no syncope. Unsteady gait, poor balance. Derm:  No itching, no rash or sores.  Endocrine:  No sweats or chills.  No  polyuria or dysuria Immunization:  No covid vax, refuses.   Travel:  None beyond local counties in last few months.    PHYSICAL EXAM: Vital signs in last 24 hours: Vitals:   05/06/20 0900 05/06/20 1000  BP: (!) 128/59 133/65  Pulse: 63 72  Resp: 19 (!) 22  Temp:    SpO2:  100% 100%   Wt Readings from Last 3 Encounters:  05/05/20 131.8 kg  11/26/19 131.8 kg  02/01/17 122.5 kg    General: Obese, chronically ill-appearing, alert, comfortable. NAD Head: No facial asymmetry or swelling. No signs of head trauma Eyes: No scleral icterus. No conjunctival pallor. Ears: Not hard of hearing Nose: No congestion or discharge Mouth: Tongue midline. Mucosa moist, pink, clear. Neck: Obese.  No JVD, no masses, no thyromegaly Lungs: Clear bilaterally. No labored breathing, no cough. Heart: RRR. No MRG. S1, S2. Abdomen: Obese, soft, nontender. No HSM, masses, bruits, hernias. Active bowel sounds..   Rectal: Deferred Musc/Skeltl: No joint redness, swelling or gross deformity. Extremities: No CCE. Neurologic: Alert and oriented x3. Appropriate. Moves all 4 limbs, strength not tested. No tremors or gross deficits. Skin: No telangiectasia, no rash, no suspicious lesions or sores. Nodes: No cervical adenopathy Psych: Appropriate. Fluid speech. Normal affect  Intake/Output from previous day: 09/01 0701 - 09/02 0700 In: 1500 [IV Piggyback:1500] Out: 400 [Urine:400] Intake/Output this shift: No intake/output data recorded.  LAB RESULTS: Recent Labs    05/05/20 1349 05/05/20 2231 05/06/20 0207  WBC 9.3  --  9.1  HGB 12.1* 10.9* 11.3*  HCT 35.7* 32.0* 34.4*  PLT PLATELET CLUMPS NOTED ON SMEAR, UNABLE TO ESTIMATE  --  100*   BMET Lab Results  Component Value Date   NA 137 05/05/2020   NA 135 05/05/2020   NA 139 11/26/2019   K 5.9 (H) 05/05/2020   K 7.2 (HH) 05/05/2020   K 4.3 11/26/2019   CL 110 05/05/2020   CL 105 05/05/2020   CL 103 11/26/2019   CO2 19 (L) 05/05/2020   CO2 26 11/26/2019   CO2 22 11/25/2019   GLUCOSE 72 05/05/2020   GLUCOSE 136 (H) 05/05/2020   GLUCOSE 246 (H) 11/26/2019   BUN 119 (H) 05/05/2020   BUN 100 (H) 05/05/2020   BUN 61 (H) 11/26/2019   CREATININE 3.30 (H) 05/05/2020   CREATININE 3.31 (H) 05/05/2020   CREATININE 1.83 (H)  11/26/2019   CALCIUM 9.6 05/05/2020   CALCIUM 8.9 11/26/2019   CALCIUM 8.6 (L) 11/25/2019   LFT Recent Labs    05/05/20 1349  PROT 6.8  ALBUMIN 4.0  AST 31  ALT 31  ALKPHOS 57  BILITOT 1.1   PT/INR Lab Results  Component Value Date   INR 1.5 (H) 11/20/2019   INR 1.3 (H) 11/19/2019   INR 2.18 (H) 04/05/2015   Hepatitis Panel No results for input(s): HEPBSAG, HCVAB, HEPAIGM, HEPBIGM in the last 72 hours. C-Diff No components found for: CDIFF Lipase  No results found for: LIPASE  Drugs of Abuse  No results found for: LABOPIA, COCAINSCRNUR, LABBENZ, AMPHETMU, THCU, LABBARB   RADIOLOGY STUDIES: US RENAL  Result Date: 05/06/2020 CLINICAL DATA:  Acute kidney injury EXAM: RENAL / URINARY TRACT ULTRASOUND COMPLETE COMPARISON:  None. FINDINGS: Right Kidney: Renal measurements: 11.8 x 6.3 x 4.9 cm = volume: 189 mL. 3.9 x 2.3 x 1.7 cm anechoic parapelvic cyst in the interpolar right kidney. Additional partially exophytic 4.4 x 3.0 x 3.7 cm cyst with some internal septation and a questionable mural nodule (  image 46/112). Echogenicity is within normal limits. No other concerning renal mass, shadowing calculus or hydronephrosis. Left Kidney: Renal measurements: 12.4 x 5.9 x 5.0 cm = volume: 193 mL. 2.4 x 1.5 x 2.0 cm anechoic parapelvic cyst in the interpolar left kidney. Echogenicity is within normal limits. No concerning renal mass, shadowing calculus or hydronephrosis. Bladder: Prevoid volume of 284 mL. Bladder appears normal for the degree of distention though patient states they feel unable to void at the time of examination. Other: None. IMPRESSION: Bilateral renal cysts. A larger partially exophytic cyst arising from the lower pole right kidney demonstrates some thin internal septation and questionable mural nodularity. More definitive evaluation could be made with MR imaging on a nonemergent basis especially if patient would better tolerate contrast media following resolution of the acute  kidney injury. Significant prevoid volume with inability to void further. Could correlate for symptoms of outlet obstruction. Otherwise unremarkable ultrasound of the urinary tract. Electronically Signed   By: Lovena Le M.D.   On: 05/06/2020 00:09   DG Chest Port 1 View  Result Date: 05/05/2020 CLINICAL DATA:  Hypotension EXAM: PORTABLE CHEST 1 VIEW COMPARISON:  11/19/2019 FINDINGS: Post sternotomy changes. Left-sided pacing device as before. No focal opacity or pleural effusion. Stable enlarged cardiomediastinal silhouette. No pneumothorax. IMPRESSION: No active disease. Cardiomegaly. Electronically Signed   By: Donavan Foil M.D.   On: 05/05/2020 19:56     IMPRESSION:   *   Many years of intermittent watery, nonbloody, incontinent stools. Only has anywhere from 1 to at most 3 episodes daily.  Controlled w imodium.  Unlikely this is infectious diarrhea such as C. difficile, CMV etc.  Could possibly have microscopic colitis. Family reported "large polyps" on colonoscopy performed more than 10 years ago and no follow-up colonoscopy since. Thus far he is FOBT positive but does not have significant anemia.  Stool PCR is processing  *   AKI, background CKD 3  *   Hyperkalemia.      PLAN:     *   Obtain KUB to assess bowel gas pattern, presence or absence of stool. Obtain fecal elastase. Await stool PCR pathogen panel results. Add Metamucil/psyllium 1 x daily.    *   Pt adamantly refusing colonoscopy.   Azucena Freed  05/06/2020, 10:31 AM Phone [phone]   Attending physician's note   I have taken a history, examined the patient and reviewed the chart. I agree with the Advanced Practitioner's note, impression and recommendations.  6 yr M with multiple co-morbidities admitted with hypotension.  C/o chronic diarrhea with 2-3 BM/day, intermittent fecal urgency and incontinence. no nocturnal symptoms. No rectal bleeding  Heme occult positive stool   Hyperkalemic AKI  Patient  had a bad experience many years ago, he does want "anything stuck up his ass" he would have turned and killed the doctor with his bare hands if could when he was undergoing his last colonoscopy"   GI pathogen panel pending Fecal elastase to exclude pancreatic insufficiency Add fiber Metamucil 1 tablespoon TID with meals    The patient was provided an opportunity to ask questions and all were answered. The patient agreed with the plan and demonstrated an understanding of the instructions.  Damaris Hippo , MD [phone]

## 2020-05-06 NOTE — ED Notes (Signed)
This RN provided phone to allow pt to talk to his wife.

## 2020-05-07 DIAGNOSIS — D649 Anemia, unspecified: Secondary | ICD-10-CM

## 2020-05-07 DIAGNOSIS — R197 Diarrhea, unspecified: Secondary | ICD-10-CM

## 2020-05-07 LAB — GLUCOSE, CAPILLARY
Glucose-Capillary: 166 mg/dL — ABNORMAL HIGH (ref 70–99)
Glucose-Capillary: 229 mg/dL — ABNORMAL HIGH (ref 70–99)
Glucose-Capillary: 156 mg/dL — ABNORMAL HIGH (ref 70–99)

## 2020-05-07 LAB — BASIC METABOLIC PANEL
Anion gap: 10 (ref 5–15)
BUN: 61 mg/dL — ABNORMAL HIGH (ref 8–23)
CO2: 18 mmol/L — ABNORMAL LOW (ref 22–32)
Calcium: 9.1 mg/dL (ref 8.9–10.3)
Chloride: 111 mmol/L (ref 98–111)
Creatinine, Ser: 2.09 mg/dL — ABNORMAL HIGH (ref 0.61–1.24)
GFR calc Af Amer: 35 mL/min — ABNORMAL LOW (ref >60)
GFR calc non Af Amer: 30 mL/min — ABNORMAL LOW (ref >60)
Glucose, Bld: 211 mg/dL — ABNORMAL HIGH (ref 70–99)
Potassium: 5.6 mmol/L — ABNORMAL HIGH (ref 3.5–5.1)
Sodium: 139 mmol/L (ref 135–145)

## 2020-05-07 LAB — CBC
HCT: 33.9 % — ABNORMAL LOW (ref 39.0–52.0)
Hemoglobin: 11.1 g/dL — ABNORMAL LOW (ref 13.0–17.0)
MCH: 32.7 pg (ref 26.0–34.0)
MCHC: 32.7 g/dL (ref 30.0–36.0)
MCV: 100 fL (ref 80.0–100.0)
Platelets: 79 10*3/uL — ABNORMAL LOW (ref 150–400)
RBC: 3.39 MIL/uL — ABNORMAL LOW (ref 4.22–5.81)
RDW: 14.6 % (ref 11.5–15.5)
WBC: 6.2 10*3/uL (ref 4.0–10.5)
nRBC: 0 % (ref 0.0–0.2)

## 2020-05-07 LAB — HEPARIN LEVEL (UNFRACTIONATED): Heparin Unfractionated: 0.64 IU/mL (ref 0.30–0.70)

## 2020-05-07 LAB — APTT: aPTT: 46 seconds — ABNORMAL HIGH (ref 24–36)

## 2020-05-07 MED ORDER — STERILE WATER FOR INJECTION IV SOLN
INTRAVENOUS | Status: DC
  Filled 2020-05-07 (×5): qty 850

## 2020-05-07 MED ORDER — METOCLOPRAMIDE HCL 5 MG/ML IJ SOLN
10.0000 mg | Freq: Once | INTRAMUSCULAR | Status: AC
  Administered 2020-05-08: 10 mg via INTRAVENOUS
  Filled 2020-05-07: qty 2

## 2020-05-07 MED ORDER — PSYLLIUM 95 % PO PACK
1.0000 | PACK | Freq: Two times a day (BID) | ORAL | Status: DC
  Filled 2020-05-07: qty 1

## 2020-05-07 MED ORDER — BISACODYL 5 MG PO TBEC
20.0000 mg | DELAYED_RELEASE_TABLET | Freq: Once | ORAL | Status: AC
  Administered 2020-05-08: 20 mg via ORAL
  Filled 2020-05-07: qty 4

## 2020-05-07 MED ORDER — METOCLOPRAMIDE HCL 5 MG/ML IJ SOLN
10.0000 mg | Freq: Once | INTRAMUSCULAR | Status: AC
  Administered 2020-05-08: 10 mg via INTRAVENOUS

## 2020-05-07 MED ORDER — BISACODYL 5 MG PO TBEC
5.0000 mg | DELAYED_RELEASE_TABLET | Freq: Once | ORAL | Status: AC
  Administered 2020-05-07: 5 mg via ORAL

## 2020-05-07 MED ORDER — PEG-KCL-NACL-NASULF-NA ASC-C 100 G PO SOLR
0.5000 | Freq: Once | ORAL | Status: AC
  Administered 2020-05-08: 100 g via ORAL
  Filled 2020-05-07: qty 1

## 2020-05-07 MED ORDER — PEG-KCL-NACL-NASULF-NA ASC-C 100 G PO SOLR: 1.0000 | Freq: Once | ORAL | Status: DC

## 2020-05-07 NOTE — Progress Notes (Addendum)
Daily Rounding Note  05/07/2020, 10:32 AM  LOS: 2 days   SUBJECTIVE:   Chief complaint:  Intermittent loose, incontinent stools Pt spoke w wife and is now agreeable to colonoscopy "if you promise not to hurt me" No BM's reported and he does not recall any BM's. No abd pain.  No N/V.    OBJECTIVE:         Vital signs in last 24 hours:    Temp:  [97.7 F (36.5 C)-99.1 F (37.3 C)] 97.7 F (36.5 C) (09/03 0723) Pulse Rate:  [65-87] 70 (09/03 0723) Resp:  [15-30] 19 (09/03 0723) BP: (119-139)/(55-79) 126/64 (09/03 0723) SpO2:  [96 %-100 %] 96 % (09/03 0723)   Filed Weights   05/05/20 1328  Weight: 131.8 kg   General: chronically ill looking, alert.    Heart: RRR Chest: clear bil.  No dyspnea Abdomen: obese, active BS, NT  Extremities: mild LE edema Neuro/Psych:  Appropriate, not acutely confused but poor memory for recent events (ie not recalling last BM).  No tremor.    Intake/Output from previous day: 09/02 0701 - 09/03 0700 In: -  Out: 1350 [Urine:1350]  Intake/Output this shift: No intake/output data recorded.  Lab Results: Recent Labs    05/05/20 1349 05/05/20 2231 05/06/20 0207  WBC 9.3  --  9.1  HGB 12.1* 10.9* 11.3*  HCT 35.7* 32.0* 34.4*  PLT PLATELET CLUMPS NOTED ON SMEAR, UNABLE TO ESTIMATE  --  100*   BMET Recent Labs    05/06/20 1125 05/06/20 1857 05/06/20 2035  NA 136 135 136  K 6.9* 5.8* 6.0*  CL 110 110 110  CO2 13* 16* 16*  GLUCOSE 160* 177* 209*  BUN 86* 78* 75*  CREATININE 2.55* 2.47* 2.50*  CALCIUM 8.9 9.0 8.5*   LFT Recent Labs    05/05/20 1349  PROT 6.8  ALBUMIN 4.0  AST 31  ALT 31  ALKPHOS 57  BILITOT 1.1    Studies/Results: US RENAL  Result Date: 05/06/2020 CLINICAL DATA:  Acute kidney injury EXAM: RENAL / URINARY TRACT ULTRASOUND COMPLETE COMPARISON:  None. FINDINGS: Right Kidney: Renal measurements: 11.8 x 6.3 x 4.9 cm = volume: 189 mL. 3.9 x 2.3 x 1.7 cm  anechoic parapelvic cyst in the interpolar right kidney. Additional partially exophytic 4.4 x 3.0 x 3.7 cm cyst with some internal septation and a questionable mural nodule (image 46/112). Echogenicity is within normal limits. No other concerning renal mass, shadowing calculus or hydronephrosis. Left Kidney: Renal measurements: 12.4 x 5.9 x 5.0 cm = volume: 193 mL. 2.4 x 1.5 x 2.0 cm anechoic parapelvic cyst in the interpolar left kidney. Echogenicity is within normal limits. No concerning renal mass, shadowing calculus or hydronephrosis. Bladder: Prevoid volume of 284 mL. Bladder appears normal for the degree of distention though patient states they feel unable to void at the time of examination. Other: None. IMPRESSION: Bilateral renal cysts. A larger partially exophytic cyst arising from the lower pole right kidney demonstrates some thin internal septation and questionable mural nodularity. More definitive evaluation could be made with MR imaging on a nonemergent basis especially if patient would better tolerate contrast media following resolution of the acute kidney injury. Significant prevoid volume with inability to void further. Could correlate for symptoms of outlet obstruction. Otherwise unremarkable ultrasound of the urinary tract. Electronically Signed   By: Lovena Le M.D.   On: 05/06/2020 00:09   DG Chest Port 1 View  Result Date:  05/05/2020 CLINICAL DATA:  Hypotension EXAM: PORTABLE CHEST 1 VIEW COMPARISON:  11/19/2019 FINDINGS: Post sternotomy changes. Left-sided pacing device as before. No focal opacity or pleural effusion. Stable enlarged cardiomediastinal silhouette. No pneumothorax. IMPRESSION: No active disease. Cardiomegaly. Electronically Signed   By: Donavan Foil M.D.   On: 05/05/2020 19:56   Scheduled Meds: . insulin aspart  0-20 Units Subcutaneous TID WC  . insulin aspart  8 Units Subcutaneous TID WC  . insulin glargine  20 Units Subcutaneous QHS  . psyllium  1 packet Oral Daily    . sodium zirconium cyclosilicate  10 g Oral TID   Continuous Infusions: . sodium chloride 100 mL/hr at 05/06/20 1637  . heparin 1,350 Units/hr (05/07/20 0055)  .  sodium bicarbonate (isotonic) infusion in sterile water     PRN Meds:.acetaminophen **OR** acetaminophen, ondansetron **OR** ondansetron (ZOFRAN) IV   ASSESMENT:   *  Intermittent loose, incontinent, non-bloody stools.  Stool PCR panel negative, does not include C diff however.   C diff is pndg. Story, BM pattern not c/w infection however.   Remote colonoscopy and pt refuses to undergo c scope.   Fecal elastase pndg.    Metamucil bid added  *   Hypotension  *    Hyperkalemia.  Persists  *   AKI, baseline CKD.  Overall BUN/creat improved.   Renal cysts, 1 large/exophytic per renal ultrasound.  outpt MRI planned.   Na bicarb in place, ACE lisinopril and aldactone on hold Dr Justin Mend consulted this AM  *   CAD, remote CABG, sents as recently as 11/2019 Chronic Plavix, Eliquis.      PLAN   *   Pt now agreeable to colonoscopy but needs normal potassium and 5 d off Plavix to proceed.    *   Last Plavix was 8/31, on hold.  5 days off plavix will be up 9/5.  Eliquis currently replaced w IV Heparin.    *   Given no BM's, inclined to cxl orders for C diff and cxl contact precautions, will d/w Dr Fuller Plan.    *   KUB to assess for constipation, overflow incontinence, abnormal BGP?    Azucena Freed  05/07/2020, 10:32 AM Phone [phone]     Attending Physician Note   I have taken an interval history, reviewed the chart and examined the patient. I agree with the Advanced Practitioner's note, impression and recommendations.   Colonoscopy to evaluate diarrhea, mild anemia after 5 day Plavix wash out, which will be Sunday 9/5. Eliquis on hold and receiving heparin which we will stop 6-8 hours before colonoscopy. Check KUB today.   Lucio Edward, MD [facility]

## 2020-05-07 NOTE — Progress Notes (Signed)
[facility] ROUNDING NOTE   Subjective:   Brief history: 74 year old gentleman with a history of stroke, CAD status post CABG congestive heart failure with systolic dysfunction ejection fraction 25 to 30% status post AICD diabetes mellitus type 2 hypertension paroxysmal atrial fibrillation anticoagulation.  Admitted to the hospital 05/06/2020 with diarrhea that has been chronic and hypotension.  Baseline serum creatinine 1.8 mg/dL.  Found to be hyperkalemic with a potassium of 7.2.  His home medications included lisinopril 20 mg daily as well as spironolactone 50 mg daily and torsemide 40 mg daily.  Blood pressure 126/64 pulse 81 temperature 97.7 O2 sats 96% urine output 600 cc 05/06/2020     Sodium 136 potassium 6 chloride 100 CO2 16 BUN 75 creatinine 2.5 calcium 8.5 hemoglobin 9.3  Insulin sliding scale, sodium bicarbonate 650 mg 3 times daily, Lokelma 10 mg 3 times daily  IV heparin   Objective:  Vital signs in last 24 hours:  Temp:  [97.7 F (36.5 C)-99.1 F (37.3 C)] 97.7 F (36.5 C) (09/03 0723) Pulse Rate:  [65-87] 70 (09/03 0723) Resp:  [15-30] 19 (09/03 0723) BP: (119-139)/(55-79) 126/64 (09/03 0723) SpO2:  [96 %-100 %] 96 % (09/03 0723)  Weight change:  Filed Weights   05/05/20 1328  Weight: 131.8 kg    Intake/Output: I/O last 3 completed shifts: In: 1500 [IV Piggyback:1500] Out: 1610 [Urine:1750]   Intake/Output this shift:  No intake/output data recorded.  General: tired-appearing, no acute distress HEENT: anicteric sclera, oropharynx clear without lesions CV: regular rate, normal rhythm, no murmurs, no gallops, no rubs, AICD site left chest wall c/d/i Lungs: clear to auscultation bilaterally, normal work of breathing, bilateral chest expansion, no w/r/r/c Abd: obese, soft, non-tender, non-distended Skin: no visible lesions or rashes Psych: alert, engaged, appropriate mood and affect Musculoskeletal: no edema, no obvious deformities Neuro:  normal speech, no gross focal deficits    Basic Metabolic Panel: Recent Labs  Lab 05/05/20 1349 05/05/20 1349 05/05/20 2231 05/06/20 1125 05/06/20 1857 05/06/20 2035  NA 135  --  137 136 135 136  K 7.2*  --  5.9* 6.9* 5.8* 6.0*  CL 105  --  110 110 110 110  CO2 19*  --   --  13* 16* 16*  GLUCOSE 136*  --  72 160* 177* 209*  BUN 100*  --  119* 86* 78* 75*  CREATININE 3.31*  --  3.30* 2.55* 2.47* 2.50*  CALCIUM 9.6   < >  --  8.9 9.0 8.5*   < > = values in this interval not displayed.    Liver Function Tests: Recent Labs  Lab 05/05/20 1349  AST 31  ALT 31  ALKPHOS 57  BILITOT 1.1  PROT 6.8  ALBUMIN 4.0   No results for input(s): LIPASE, AMYLASE in the last 168 hours. No results for input(s): AMMONIA in the last 168 hours.  CBC: Recent Labs  Lab 05/05/20 1349 05/05/20 2231 05/06/20 0207  WBC 9.3  --  9.1  NEUTROABS 6.7  --   --   HGB 12.1* 10.9* 11.3*  HCT 35.7* 32.0* 34.4*  MCV 99.2  --  99.7  PLT PLATELET CLUMPS NOTED ON SMEAR, UNABLE TO ESTIMATE  --  100*    Cardiac Enzymes: Recent Labs  Lab 05/06/20 1857  CKTOTAL 91    BNP: Invalid input(s): POCBNP  CBG: Recent Labs  Lab 05/06/20 0813 05/06/20 1438 05/06/20 1603 05/06/20 2118  GLUCAP 116* 154* 156* 199*    Microbiology: Results for orders  placed or performed during the hospital encounter of 05/05/20  Blood culture (routine x 2)     Status: None (Preliminary result)   Collection Time: 05/05/20  7:20 PM   Specimen: BLOOD  Result Value Ref Range Status   Specimen Description BLOOD LEFT ANTECUBITAL  Final   Special Requests   Final    BOTTLES DRAWN AEROBIC AND ANAEROBIC Blood Culture results may not be optimal due to an excessive volume of blood received in culture bottles   Culture   Final    NO GROWTH 2 DAYS Performed at [facility] [address], [place], [place] [address]    Report Status PENDING  Incomplete  Blood culture (routine x 2)     Status: None (Preliminary  result)   Collection Time: 05/05/20  7:28 PM   Specimen: BLOOD RIGHT FOREARM  Result Value Ref Range Status   Specimen Description BLOOD RIGHT FOREARM  Final   Special Requests   Final    BOTTLES DRAWN AEROBIC AND ANAEROBIC Blood Culture adequate volume   Culture   Final    NO GROWTH 2 DAYS Performed at [facility], [place] [address], [place], [place] [address]    Report Status PENDING  Incomplete  SARS Coronavirus 2 by RT PCR (hospital order, performed in [facility] hospital lab) Nasopharyngeal Nasopharyngeal Swab     Status: None   Collection Time: 05/05/20  7:32 PM   Specimen: Nasopharyngeal Swab  Result Value Ref Range Status   SARS Coronavirus 2 NEGATIVE NEGATIVE Final    Comment: (NOTE) SARS-CoV-2 target nucleic acids are NOT DETECTED.  The SARS-CoV-2 RNA is generally detectable in upper and lower respiratory specimens during the acute phase of infection. The lowest concentration of SARS-CoV-2 viral copies this assay can detect is 250 copies / mL. A negative result does not preclude SARS-CoV-2 infection and should not be used as the sole basis for treatment or other patient management decisions.  A negative result may occur with improper specimen collection / handling, submission of specimen other than nasopharyngeal swab, presence of viral mutation(s) within the areas targeted by this assay, and inadequate number of viral copies (<250 copies / mL). A negative result must be combined with clinical observations, patient history, and epidemiological information.  Fact Sheet for Patients:   StrictlyIdeas.no  Fact Sheet for Healthcare Providers: BankingDealers.co.za  This test is not yet approved or  cleared by the [place] FDA and has been authorized for detection and/or diagnosis of SARS-CoV-2 by FDA under an Emergency Use Authorization (EUA).  This EUA will remain in effect (meaning this test can be used) for the  duration of the COVID-19 declaration under Section 564(b)(1) of the Act, 21 U.S.C. section 360bbb-3(b)(1), unless the authorization is terminated or revoked sooner.  Performed at [facility], [place] [address], [place], [place] [address]   Gastrointestinal Panel by PCR , Stool     Status: None   Collection Time: 05/06/20 12:20 AM   Specimen: Stool  Result Value Ref Range Status   Campylobacter species NOT DETECTED NOT DETECTED Final   Plesimonas shigelloides NOT DETECTED NOT DETECTED Final   Salmonella species NOT DETECTED NOT DETECTED Final   Yersinia enterocolitica NOT DETECTED NOT DETECTED Final   Vibrio species NOT DETECTED NOT DETECTED Final   Vibrio cholerae NOT DETECTED NOT DETECTED Final   Enteroaggregative E coli (EAEC) NOT DETECTED NOT DETECTED Final   Enteropathogenic E coli (EPEC) NOT DETECTED NOT DETECTED Final  Enterotoxigenic E coli (ETEC) NOT DETECTED NOT DETECTED Final   Shiga like toxin producing E coli (STEC) NOT DETECTED NOT DETECTED Final   Shigella/Enteroinvasive E coli (EIEC) NOT DETECTED NOT DETECTED Final   Cryptosporidium NOT DETECTED NOT DETECTED Final   Cyclospora cayetanensis NOT DETECTED NOT DETECTED Final   Entamoeba histolytica NOT DETECTED NOT DETECTED Final   Giardia lamblia NOT DETECTED NOT DETECTED Final   Adenovirus F40/41 NOT DETECTED NOT DETECTED Final   Astrovirus NOT DETECTED NOT DETECTED Final   Norovirus GI/GII NOT DETECTED NOT DETECTED Final   Rotavirus A NOT DETECTED NOT DETECTED Final   Sapovirus (I, II, IV, and V) NOT DETECTED NOT DETECTED Final    Comment: Performed at [facility], [address] [place], [place] [address]    Coagulation Studies: No results for input(s): LABPROT, INR in the last 72 hours.  Urinalysis: Recent Labs    05/05/20 1949  COLORURINE YELLOW  LABSPEC 1.009  PHURINE 5.0  GLUCOSEU NEGATIVE  HGBUR NEGATIVE  BILIRUBINUR NEGATIVE  KETONESUR NEGATIVE  PROTEINUR NEGATIVE  NITRITE  NEGATIVE  LEUKOCYTESUR NEGATIVE      Imaging: US RENAL  Result Date: 05/06/2020 CLINICAL DATA:  Acute kidney injury EXAM: RENAL / URINARY TRACT ULTRASOUND COMPLETE COMPARISON:  None. FINDINGS: Right Kidney: Renal measurements: 11.8 x 6.3 x 4.9 cm = volume: 189 mL. 3.9 x 2.3 x 1.7 cm anechoic parapelvic cyst in the interpolar right kidney. Additional partially exophytic 4.4 x 3.0 x 3.7 cm cyst with some internal septation and a questionable mural nodule (image 46/112). Echogenicity is within normal limits. No other concerning renal mass, shadowing calculus or hydronephrosis. Left Kidney: Renal measurements: 12.4 x 5.9 x 5.0 cm = volume: 193 mL. 2.4 x 1.5 x 2.0 cm anechoic parapelvic cyst in the interpolar left kidney. Echogenicity is within normal limits. No concerning renal mass, shadowing calculus or hydronephrosis. Bladder: Prevoid volume of 284 mL. Bladder appears normal for the degree of distention though patient states they feel unable to void at the time of examination. Other: None. IMPRESSION: Bilateral renal cysts. A larger partially exophytic cyst arising from the lower pole right kidney demonstrates some thin internal septation and questionable mural nodularity. More definitive evaluation could be made with MR imaging on a nonemergent basis especially if patient would better tolerate contrast media following resolution of the acute kidney injury. Significant prevoid volume with inability to void further. Could correlate for symptoms of outlet obstruction. Otherwise unremarkable ultrasound of the urinary tract. Electronically Signed   By: Lovena Le M.D.   On: 05/06/2020 00:09   DG Chest Port 1 View  Result Date: 05/05/2020 CLINICAL DATA:  Hypotension EXAM: PORTABLE CHEST 1 VIEW COMPARISON:  11/19/2019 FINDINGS: Post sternotomy changes. Left-sided pacing device as before. No focal opacity or pleural effusion. Stable enlarged cardiomediastinal silhouette. No pneumothorax. IMPRESSION: No active  disease. Cardiomegaly. Electronically Signed   By: Donavan Foil M.D.   On: 05/05/2020 19:56     Medications:   . sodium chloride 100 mL/hr at 05/06/20 1637  . heparin 1,350 Units/hr (05/07/20 0055)   . insulin aspart  0-20 Units Subcutaneous TID WC  . insulin aspart  8 Units Subcutaneous TID WC  . insulin glargine  20 Units Subcutaneous QHS  . psyllium  1 packet Oral Daily  . sodium bicarbonate  650 mg Oral TID  . sodium zirconium cyclosilicate  10 g Oral TID   acetaminophen **OR** acetaminophen, ondansetron **OR** ondansetron (ZOFRAN) IV  Assessment/ Plan:  AKI on CKD3 (  improved): Appears to be improving.  We will continue IV fluids and holding ACE inhibitor as well as potassium sparing diuretic  Hyperkalemia Agree with holding ACE inhibitor and potassium sparing diuretic.  Continue IV sodium bicarbonate 75 cc an hour  Metabolic acidosis, non-anion gap -We will start IV fluids with IV sodium bicarbonate at 75 cc an hour  Renal cyst -lower pole right kidney has a partially exophytic cyst with thin internal septation and questionable mural nodularity. Will need further evaluation with a MRI with and without contrast (2nd or 3rd contrast agent) which can be done as an outpatient  Hypertension: -We will continue to follow continue to hold ACE inhibitor and spironolactone  Anemia due to chronic kidney disease: Not an issue at this time   Diarrhea -GI on board, patient refuses c-scope  Uncontrolled Diabetes Mellitus Type 2 with Hyperglycemia -mgmt per primary service  HFrEF s/p AICD, EF 25-30%    LOS: 2 Sherril [place]on @TODAY @9 :33 AM

## 2020-05-07 NOTE — Progress Notes (Signed)
[facility]  PROGRESS NOTE  Vernon Briggs FXJ:[id number] DOB: 1946/01/13 DOA: 05/05/2020 PCP: [name], MD   Brief HPI:   74 year old male with medical history of stroke, CAD s/p CABG, HFr EF, 25 to 30%, status post AICD, CKD stage III, diabetes mellitus type 2, hypertension, paroxysmal atrial fibrillation on Eliquis was brought to hospital for hypotension and diarrhea.  Patient has history of chronic diarrhea for several years usually gets 1-3 stools per day.  Over the past few days he has been intermittently hypotensive.  Home health RN noted that he was hypotensive with SBP 60s at home and called EMS. In the ED patient's creatinine was found to be 3.3 up from 1.8 at baseline.  Initial potassium was 7.2 which improved to 5.9 with IV fluid bolus.   Subjective   Patient seen and examined, looks better this morning.  Denies abdominal pain.  No BM since patient came to the hospital.   Assessment/Plan:     1. Acute kidney injury on CKD stage III-likely prerenal from dehydration from ongoing diarrhea.  Renal ultrasound shows no hydronephrosis, large partially exophytic cyst arising from the lower pole right kidney demonstrates some thin internal septation and questionable mural nodularity.  No definitive evaluation from MR imaging on a nonemergent basis can be done as outpatient.  Creatinine has improved to 2.50 this morning.  Follow BMP in a.m. 2. Hypotension-resolved chronically from dehydration.  Antihypertensive medications are currently on hold. 3. Chronic diarrhea/positive FOBT-patient has history of chronic diarrhea 1-3 loose stools daily for past 20 years.  He had colonoscopy 10 years ago, GI was consulted.  Patient agrees to undergo colonoscopy.  Plan for colonoscopy on Monday.  Plavix is currently on hold.   4. Hyperkalemia-potassium was elevated yesterday at 7.2.  Potassium improved to 5.9 with IV fluids.  Potassium went up to 6.9 yesterday so nephrology was consulted.   Currently patient is on Lokelma 10 g 3 times daily.  Follow potassium level.  Nephrology following.     5. Diabetes mellitus type 2-CBG well controlled, continue Lantus 20 units subcu daily, NovoLog 8 units 3 times daily meal coverage, resistant sliding scale insulin with NovoLog. 6. PAF-continue heparin GTT for anticoagulation, continue Coreg.  Heart rate is well controlled. 7. Hypertension-diuretics currently on hold. 8. CAD s/p CABG-EF 25 to 30%, Plavix is currently on hold.  Coreg is on hold due to hypotension.     COVID-19 Labs  No results for input(s): DDIMER, FERRITIN, LDH, CRP in the last 72 hours.  Lab Results  Component Value Date   SARSCOV2NAA NEGATIVE 05/05/2020   [place] NEGATIVE 11/19/2019     Scheduled medications:   . [START ON 05/08/2020] bisacodyl  20 mg Oral Once  . bisacodyl  5 mg Oral Once  . insulin aspart  0-20 Units Subcutaneous TID WC  . insulin aspart  8 Units Subcutaneous TID WC  . insulin glargine  20 Units Subcutaneous QHS  . [START ON 05/08/2020] metoCLOPramide (REGLAN) injection  10 mg Intravenous Once   Followed by  . [START ON 05/08/2020] metoCLOPramide (REGLAN) injection  10 mg Intravenous Once  . [START ON 05/08/2020] peg 3350 powder  0.5 kit Oral Once   And  . [START ON 05/08/2020] peg 3350 powder  0.5 kit Oral Once  . sodium zirconium cyclosilicate  10 g Oral TID         CBG: Recent Labs  Lab 05/06/20 0813 05/06/20 1438 05/06/20 1603 05/06/20 2118 05/07/20 1119  GLUCAP 116* 154* 156* 199*  166*    SpO2: 96 % O2 Flow Rate (L/min): 2 L/min    CBC: Recent Labs  Lab 05/05/20 1349 05/05/20 2231 05/06/20 0207  WBC 9.3  --  9.1  NEUTROABS 6.7  --   --   HGB 12.1* 10.9* 11.3*  HCT 35.7* 32.0* 34.4*  MCV 99.2  --  99.7  PLT PLATELET CLUMPS NOTED ON SMEAR, UNABLE TO ESTIMATE  --  100*    Basic Metabolic Panel: Recent Labs  Lab 05/05/20 1349 05/05/20 2231 05/06/20 1125 05/06/20 1857 05/06/20 2035  NA 135 137 136 135 136   K 7.2* 5.9* 6.9* 5.8* 6.0*  CL 105 110 110 110 110  CO2 19*  --  13* 16* 16*  GLUCOSE 136* 72 160* 177* 209*  BUN 100* 119* 86* 78* 75*  CREATININE 3.31* 3.30* 2.55* 2.47* 2.50*  CALCIUM 9.6  --  8.9 9.0 8.5*     Liver Function Tests: Recent Labs  Lab 05/05/20 1349  AST 31  ALT 31  ALKPHOS 57  BILITOT 1.1  PROT 6.8  ALBUMIN 4.0     Antibiotics: Anti-infectives (From admission, onward)   None       DVT prophylaxis: Heparin  Code Status: DNR  Family Communication: No family at bedside    Status is: Inpatient  Dispo: The patient is from: Home              Anticipated d/c is to: Home              Anticipated d/c date is: 05/09/2020              Patient currently not medically stable for discharge  Barrier to discharge-ongoing diarrhea, hypotension, hyperkalemia          Consultants:    Procedures:     Objective   Vitals:   05/07/20 0100 05/07/20 0436 05/07/20 0723 05/07/20 1120  BP:  133/79 126/64 (!) 120/51  Pulse: 65 86 70 82  Resp: (!) '22 20 19 20  ' Temp:  98.2 F (36.8 C) 97.7 F (36.5 C) 97.6 F (36.4 C)  TempSrc:  Oral Oral Oral  SpO2: 100% 100% 96% 96%  Weight:      Height:        Intake/Output Summary (Last 24 hours) at 05/07/2020 1500 Last data filed at 05/07/2020 1122 Gross per 24 hour  Intake --  Output 1625 ml  Net -1625 ml    09/01 1901 - 09/03 0700 In: 1500  Out: 1750 [Urine:1750]  Filed Weights   05/05/20 1328  Weight: 131.8 kg    Physical Examination:    General-appears in no acute distress  Heart-S1-S2, regular, no murmur auscultated  Lungs-clear to auscultation bilaterally, no wheezing or crackles auscultated  Abdomen-soft, nontender, no organomegaly  Extremities-no edema in the lower extremities  Neuro-alert, oriented x3, no focal deficit noted    Data Reviewed:   Recent Results (from the past 240 hour(s))  Blood culture (routine x 2)     Status: None (Preliminary result)   Collection  Time: 05/05/20  7:20 PM   Specimen: BLOOD  Result Value Ref Range Status   Specimen Description BLOOD LEFT ANTECUBITAL  Final   Special Requests   Final    BOTTLES DRAWN AEROBIC AND ANAEROBIC Blood Culture results may not be optimal due to an excessive volume of blood received in culture bottles   Culture   Final    NO GROWTH 2 DAYS Performed at [facility], [address] [place], [place] [address]    Report Status PENDING  Incomplete  Blood culture (routine x 2)     Status: None (Preliminary result)   Collection Time: 05/05/20  7:28 PM   Specimen: BLOOD RIGHT FOREARM  Result Value Ref Range Status   Specimen Description BLOOD RIGHT FOREARM  Final   Special Requests   Final    BOTTLES DRAWN AEROBIC AND ANAEROBIC Blood Culture adequate volume   Culture   Final    NO GROWTH 2 DAYS Performed at [facility], [place] [address], [place], [place] [address]    Report Status PENDING  Incomplete  SARS Coronavirus 2 by RT PCR (hospital order, performed in [facility] hospital lab) Nasopharyngeal Nasopharyngeal Swab     Status: None   Collection Time: 05/05/20  7:32 PM   Specimen: Nasopharyngeal Swab  Result Value Ref Range Status   SARS Coronavirus 2 NEGATIVE NEGATIVE Final    Comment: (NOTE) SARS-CoV-2 target nucleic acids are NOT DETECTED.  The SARS-CoV-2 RNA is generally detectable in upper and lower respiratory specimens during the acute phase of infection. The lowest concentration of SARS-CoV-2 viral copies this assay can detect is 250 copies / mL. A negative result does not preclude SARS-CoV-2 infection and should not be used as the sole basis for treatment or other patient management decisions.  A negative result may occur with improper specimen collection / handling, submission of specimen other than nasopharyngeal swab, presence of viral mutation(s) within the areas targeted by this assay, and inadequate number of viral copies (<250 copies / mL). A negative result  must be combined with clinical observations, patient history, and epidemiological information.  Fact Sheet for Patients:   StrictlyIdeas.no  Fact Sheet for Healthcare Providers: BankingDealers.co.za  This test is not yet approved or  cleared by the [place] FDA and has been authorized for detection and/or diagnosis of SARS-CoV-2 by FDA under an Emergency Use Authorization (EUA).  This EUA will remain in effect (meaning this test can be used) for the duration of the COVID-19 declaration under Section 564(b)(1) of the Act, 21 U.S.C. section 360bbb-3(b)(1), unless the authorization is terminated or revoked sooner.  Performed at [facility], [place] [address] [place], [place] [address]   Gastrointestinal Panel by PCR , Stool     Status: None   Collection Time: 05/06/20 12:20 AM   Specimen: Stool  Result Value Ref Range Status   Campylobacter species NOT DETECTED NOT DETECTED Final   Plesimonas shigelloides NOT DETECTED NOT DETECTED Final   Salmonella species NOT DETECTED NOT DETECTED Final   Yersinia enterocolitica NOT DETECTED NOT DETECTED Final   Vibrio species NOT DETECTED NOT DETECTED Final   Vibrio cholerae NOT DETECTED NOT DETECTED Final   Enteroaggregative E coli (EAEC) NOT DETECTED NOT DETECTED Final   Enteropathogenic E coli (EPEC) NOT DETECTED NOT DETECTED Final   Enterotoxigenic E coli (ETEC) NOT DETECTED NOT DETECTED Final   Shiga like toxin producing E coli (STEC) NOT DETECTED NOT DETECTED Final   Shigella/Enteroinvasive E coli (EIEC) NOT DETECTED NOT DETECTED Final   Cryptosporidium NOT DETECTED NOT DETECTED Final   Cyclospora cayetanensis NOT DETECTED NOT DETECTED Final   Entamoeba histolytica NOT DETECTED NOT DETECTED Final   Giardia lamblia NOT DETECTED NOT DETECTED Final   Adenovirus F40/41 NOT DETECTED NOT DETECTED Final   Astrovirus NOT DETECTED NOT DETECTED Final   Norovirus GI/GII NOT DETECTED NOT  DETECTED Final   Rotavirus A NOT DETECTED NOT DETECTED  Final   Sapovirus (I, II, IV, and V) NOT DETECTED NOT DETECTED Final    Comment: Performed at [facility], [address] [place], [place] [address]    No results for input(s): LIPASE, AMYLASE in the last 168 hours. No results for input(s): AMMONIA in the last 168 hours.  Cardiac Enzymes: Recent Labs  Lab May 24, 2020 1857  CKTOTAL 91   BNP (last 3 results) No results for input(s): BNP in the last 8760 hours.  ProBNP (last 3 results) No results for input(s): PROBNP in the last 8760 hours.  Studies:  US RENAL  Result Date: May 24, 2020 CLINICAL DATA:  Acute kidney injury EXAM: RENAL / URINARY TRACT ULTRASOUND COMPLETE COMPARISON:  None. FINDINGS: Right Kidney: Renal measurements: 11.8 x 6.3 x 4.9 cm = volume: 189 mL. 3.9 x 2.3 x 1.7 cm anechoic parapelvic cyst in the interpolar right kidney. Additional partially exophytic 4.4 x 3.0 x 3.7 cm cyst with some internal septation and a questionable mural nodule (image 46/112). Echogenicity is within normal limits. No other concerning renal mass, shadowing calculus or hydronephrosis. Left Kidney: Renal measurements: 12.4 x 5.9 x 5.0 cm = volume: 193 mL. 2.4 x 1.5 x 2.0 cm anechoic parapelvic cyst in the interpolar left kidney. Echogenicity is within normal limits. No concerning renal mass, shadowing calculus or hydronephrosis. Bladder: Prevoid volume of 284 mL. Bladder appears normal for the degree of distention though patient states they feel unable to void at the time of examination. Other: None. IMPRESSION: Bilateral renal cysts. A larger partially exophytic cyst arising from the lower pole right kidney demonstrates some thin internal septation and questionable mural nodularity. More definitive evaluation could be made with MR imaging on a nonemergent basis especially if patient would better tolerate contrast media following resolution of the acute kidney injury. Significant prevoid  volume with inability to void further. Could correlate for symptoms of outlet obstruction. Otherwise unremarkable ultrasound of the urinary tract. Electronically Signed   By: Lovena Le M.D.   On: 2020-05-24 00:09   DG Chest Port 1 View  Result Date: 05/05/2020 CLINICAL DATA:  Hypotension EXAM: PORTABLE CHEST 1 VIEW COMPARISON:  11/19/2019 FINDINGS: Post sternotomy changes. Left-sided pacing device as before. No focal opacity or pleural effusion. Stable enlarged cardiomediastinal silhouette. No pneumothorax. IMPRESSION: No active disease. Cardiomegaly. Electronically Signed   By: Donavan Foil M.D.   On: 05/05/2020 19:56       [place]   [facility] If 7PM-7AM, please contact night-coverage at [email or web address], Office  [phone]   05/07/2020, 3:00 PM  LOS: 2 days

## 2020-05-07 NOTE — Progress Notes (Addendum)
[place] for IV heparin Indication: afib, Eliquis on hold  Allergies  Allergen Reactions  . Tape Other (See Comments)    Causes skin irritation    Patient Measurements: Height: 6' (182.9 cm) Weight: 131.8 kg (290 lb 9.1 oz) IBW/kg (Calculated) : 77.6 Heparin Dosing Weight: 107 kg  Vital Signs: Temp: 97.6 F (36.4 C) (09/03 1120) Temp Source: Oral (09/03 1120) BP: 120/51 (09/03 1120) Pulse Rate: 82 (09/03 1120)  Labs: Recent Labs    05/05/20 1349 05/05/20 1349 05/05/20 2231 05/05/20 2231 05/06/20 0207 05/06/20 0207 05/06/20 1125 05/06/20 1125 05/06/20 1126 05/06/20 1857 05/06/20 2035 05/07/20 1347  HGB 12.1*   < > 10.9*   < > 11.3*  --   --   --   --   --   --  11.1*  HCT 35.7*   < > 32.0*  --  34.4*  --   --   --   --   --   --  33.9*  PLT PLATELET CLUMPS NOTED ON SMEAR, UNABLE TO ESTIMATE  --   --   --  100*  --   --   --   --   --   --  79*  APTT  --   --   --   --  31   < > 68*  --   --   --  65* 46*  HEPARINUNFRC  --   --   --   --  >2.20*  --   --   --  >2.20*  --   --  0.64  CREATININE 3.31*   < > 3.30*  --   --    < > 2.55*   < >  --  2.47* 2.50* 2.09*  CKTOTAL  --   --   --   --   --   --   --   --   --  91  --   --    < > = values in this interval not displayed.    Estimated Creatinine Clearance: 42.2 mL/min (A) (by C-G formula based on SCr of 2.09 mg/dL (H)).   Medical History: Past Medical History:  Diagnosis Date  . AICD (automatic cardioverter/defibrillator) present   . Anxiety   . Arthritis    "occasionally in my right hand" (04/02/2015)  . CAD in native artery 1999  . Cancer ([place])   . CHF (congestive heart failure) ([place])   . Depression   . GERD (gastroesophageal reflux disease)    "occasionally" (04/02/2015)  . History of hiatal hernia   . Hypercholesterolemia   . Hypertension   . Myocardial infarction ([place]) "several"  . OSA on CPAP   . PAF (paroxysmal atrial fibrillation) ([place])   . PTSD  (post-traumatic stress disorder)   . Stroke ([place])    "I've had 2; my right leg & arm slightly weaker since" (04/02/2015)  . Type II diabetes mellitus ([facility])     Medications:  Infusions:  . sodium chloride 100 mL/hr at 05/06/20 1637  . heparin 1,350 Units/hr (05/07/20 0055)  .  sodium bicarbonate (isotonic) infusion in sterile water 100 mL/hr at 05/07/20 1244    Assessment: 74 yo male admitted with suspicion for GI bleed and AKI.  On Eliquis PTA, unable to confirm timing of last dose. Noted plans for colonoscopy on 9/5. Heparin level elevated with recent Eliquis.  -aPTT= 46 -Hg= 11.1, plt= 79 (history of low plt)  RN noted a small  amount of blood at the IV site.   Goal of Therapy:  Heparin level 0.3-0.5 - will target lower end given ?GIB APTT 66-85 Monitor platelets by anticoagulation protocol: Yes   Plan:  Increase heparin gtt to 1550 units/hr APTT in 8 hrs Daily aPTT, heparin level and CBC Will watch IV site for any progression of bleeding  Hildred Laser, PharmD Clinical Pharmacist **Pharmacist phone directory can now be found on [email or web address] ([email or web address]).  Listed under [place].

## 2020-05-08 DIAGNOSIS — Z7901 Long term (current) use of anticoagulants: Secondary | ICD-10-CM

## 2020-05-08 LAB — APTT
aPTT: 57 seconds — ABNORMAL HIGH (ref 24–36)
aPTT: 78 seconds — ABNORMAL HIGH (ref 24–36)

## 2020-05-08 LAB — CBC
HCT: 30.1 % — ABNORMAL LOW (ref 39.0–52.0)
Hemoglobin: 10.4 g/dL — ABNORMAL LOW (ref 13.0–17.0)
MCH: 34.1 pg — ABNORMAL HIGH (ref 26.0–34.0)
MCHC: 34.6 g/dL (ref 30.0–36.0)
MCV: 98.7 fL (ref 80.0–100.0)
Platelets: 68 10*3/uL — ABNORMAL LOW (ref 150–400)
RBC: 3.05 MIL/uL — ABNORMAL LOW (ref 4.22–5.81)
RDW: 14.3 % (ref 11.5–15.5)
WBC: 5.5 10*3/uL (ref 4.0–10.5)
nRBC: 0 % (ref 0.0–0.2)

## 2020-05-08 LAB — GLUCOSE, CAPILLARY
Glucose-Capillary: 133 mg/dL — ABNORMAL HIGH (ref 70–99)
Glucose-Capillary: 240 mg/dL — ABNORMAL HIGH (ref 70–99)
Glucose-Capillary: 88 mg/dL (ref 70–99)
Glucose-Capillary: 150 mg/dL — ABNORMAL HIGH (ref 70–99)

## 2020-05-08 LAB — HEPARIN LEVEL (UNFRACTIONATED)
Heparin Unfractionated: 0.46 IU/mL (ref 0.30–0.70)
Heparin Unfractionated: 0.46 IU/mL (ref 0.30–0.70)

## 2020-05-08 LAB — BASIC METABOLIC PANEL
Anion gap: 11 (ref 5–15)
BUN: 42 mg/dL — ABNORMAL HIGH (ref 8–23)
CO2: 21 mmol/L — ABNORMAL LOW (ref 22–32)
Calcium: 8.8 mg/dL — ABNORMAL LOW (ref 8.9–10.3)
Chloride: 108 mmol/L (ref 98–111)
Creatinine, Ser: 1.83 mg/dL — ABNORMAL HIGH (ref 0.61–1.24)
GFR calc Af Amer: 41 mL/min — ABNORMAL LOW (ref >60)
GFR calc non Af Amer: 35 mL/min — ABNORMAL LOW (ref >60)
Glucose, Bld: 195 mg/dL — ABNORMAL HIGH (ref 70–99)
Potassium: 4.5 mmol/L (ref 3.5–5.1)
Sodium: 140 mmol/L (ref 135–145)

## 2020-05-08 MED ORDER — HEPARIN (PORCINE) 25000 UT/250ML-% IV SOLN
1700.0000 [IU]/h | INTRAVENOUS | Status: AC
  Administered 2020-05-08 (×2): 1700 [IU]/h via INTRAVENOUS
  Filled 2020-05-08: qty 250

## 2020-05-08 NOTE — Progress Notes (Signed)
[place] for IV heparin Indication: afib, Eliquis on hold  Allergies  Allergen Reactions  . Tape Other (See Comments)    Causes skin irritation    Patient Measurements: Height: 6' (182.9 cm) Weight: 131.8 kg (290 lb 9.1 oz) IBW/kg (Calculated) : 77.6 Heparin Dosing Weight: 107 kg  Vital Signs: Temp: 98 F (36.7 C) (09/04 1054) Temp Source: Oral (09/04 1054) BP: 143/64 (09/04 1054) Pulse Rate: 79 (09/04 1054)  Labs: Recent Labs    05/06/20 0207 05/06/20 0207 05/06/20 1125 05/06/20 1126 05/06/20 1857 05/06/20 2035 05/07/20 1347 05/08/20 0120 05/08/20 1131  HGB 11.3*   < >  --   --   --   --  11.1* 10.4*  --   HCT 34.4*  --   --   --   --   --  33.9* 30.1*  --   PLT 100*  --   --   --   --   --  79* 68*  --   APTT 31   < >   < >  --   --  65* 46* 57* 78*  HEPARINUNFRC >2.20*  --   --  >2.20*  --   --  0.64 0.46  --   CREATININE  --   --    < >  --  2.47* 2.50* 2.09*  --   --   CKTOTAL  --   --   --   --  91  --   --   --   --    < > = values in this interval not displayed.    Estimated Creatinine Clearance: 42.2 mL/min (A) (by C-G formula based on SCr of 2.09 mg/dL (H)).   Assessment: 74 yo male admitted with suspicion for GI bleed and AKI.  On Eliquis PTA, unable to confirm timing of last dose. Noted plans for colonoscopy on 9/5.   Heparin level and aPTT therapeutic at 1700 units/hr. Heparin level now correlating with aPTT. Will utilize heparin level for monitoring. Hgb stable, PLT low (history of), no bleeding noted.   Goal of Therapy:  Heparin level 0.3-0.5 - will target lower end given ?GIB Monitor platelets by anticoagulation protocol: Yes   Plan:  Continue heparin at 1700 units/hr Confirmatory HL in 8 hrs Daily HL/CBC Stop heparin 6 hours before colonoscopy planned for 9/5   Romilda Garret, PharmD PGY1 Acute Care Pharmacy Resident Phone: [phone] 05/08/2020 12:40 PM  Please check [email or web address] for unit  specific pharmacy phone numbers.

## 2020-05-08 NOTE — H&P (View-Only) (Signed)
Patient ID: Vernon Briggs, male   DOB: 1946-04-27, 74 y.o.   MRN: [id number]    Progress Note   Subjective  Day # 3 CC: diarrhea, hypotension  C. difficile quick screen collected yesterday pending Fecal elastase, not collected as yet  Labs today-WBC 5.5, hemoglobin 10.4/hematocrit of 30.1 BMET pending  Patient says he has been quite despondent about his health issues recently.  He is agreeable to colonoscopy, not sure he will be able to get up to bedside commode for prep.  No complaints of abdominal pain, 1 loose bowel movement today    Objective   Vital signs in last 24 hours: Temp:  [97.8 F (36.6 C)-98.1 F (36.7 C)] 98 F (36.7 C) (09/04 1054) Pulse Rate:  [68-85] 79 (09/04 1054) Resp:  [16-21] 17 (09/04 1054) BP: (117-146)/(58-82) 143/64 (09/04 1054) SpO2:  [96 %-100 %] 100 % (09/04 1054) Last BM Date: 05/06/20   General:    Elderly white male in NAD, chronically ill-appearing Heart:  Regular rate and rhythm; no murmurs Lungs: Respirations even and unlabored, lungs CTA bilaterally Abdomen:  Soft, obese, nontender and nondistended. Normal bowel sounds. Extremities:  Without edema. Neurologic:  Alert and oriented,  grossly normal neurologically. Psych:  Cooperative. Normal mood and affect.  Intake/Output from previous day: 09/03 0701 - 09/04 0700 In: -  Out: 1175 [Urine:1175] Intake/Output this shift: Total I/O In: -  Out: 250 [Urine:250]  Lab Results: Recent Labs    05/06/20 0207 05/07/20 1347 05/08/20 0120  WBC 9.1 6.2 5.5  HGB 11.3* 11.1* 10.4*  HCT 34.4* 33.9* 30.1*  PLT 100* 79* 68*   BMET Recent Labs    05/06/20 1857 05/06/20 2035 05/07/20 1347  NA 135 136 139  K 5.8* 6.0* 5.6*  CL 110 110 111  CO2 16* 16* 18*  GLUCOSE 177* 209* 211*  BUN 78* 75* 61*  CREATININE 2.47* 2.50* 2.09*  CALCIUM 9.0 8.5* 9.1   LFT Recent Labs    05/05/20 1349  PROT 6.8  ALBUMIN 4.0  AST 31  ALT 31  ALKPHOS 57  BILITOT 1.1     Assessment / Plan:      #67 74 year old white male with chronic diarrhea, times present over the past few years. C. difficile quick screen pending-doubt infectious Fecal elastase - not collected Rule out microscopic colitis, IBD, IBS pancreatic insufficiency  Plan is for bowel prep today and colonoscopy tomorrow 05/09/2020 with Dr. Fuller Plan.  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 with Dr. Fuller Plan.  Procedure was discussed in detail with the patient including indications risks and benefits and he is agreeable to proceed.  Hold heparin for 6 hours prior to procedure. Continue to hold Plavix.   Further recommendations pending findings at colonoscopy.    LOS: 3 days   Amy EsterwoodPA-C  05/08/2020, 11:35 AM      Attending Physician Note   I have taken an interval history, reviewed the chart and examined the patient. I agree with the Advanced Practitioner's note, impression and recommendations. Colonoscopy for further evaluation of chronic diarrhea tomorrow. Hold heparin 6 hrs prior to colonoscopy. Continue to hold Plavix.   Lucio Edward, MD [facility]

## 2020-05-08 NOTE — Progress Notes (Addendum)
Patient ID: Vernon Briggs, male   DOB: 12/18/1945, 74 y.o.   MRN: [id number]    Progress Note   Subjective  Day # 3 CC: diarrhea, hypotension  C. difficile quick screen collected yesterday pending Fecal elastase, not collected as yet  Labs today-WBC 5.5, hemoglobin 10.4/hematocrit of 30.1 BMET pending  Patient says he has been quite despondent about his health issues recently.  He is agreeable to colonoscopy, not sure he will be able to get up to bedside commode for prep.  No complaints of abdominal pain, 1 loose bowel movement today    Objective   Vital signs in last 24 hours: Temp:  [97.8 F (36.6 C)-98.1 F (36.7 C)] 98 F (36.7 C) (09/04 1054) Pulse Rate:  [68-85] 79 (09/04 1054) Resp:  [16-21] 17 (09/04 1054) BP: (117-146)/(58-82) 143/64 (09/04 1054) SpO2:  [96 %-100 %] 100 % (09/04 1054) Last BM Date: 05/06/20   General:    Elderly white male in NAD, chronically ill-appearing Heart:  Regular rate and rhythm; no murmurs Lungs: Respirations even and unlabored, lungs CTA bilaterally Abdomen:  Soft, obese, nontender and nondistended. Normal bowel sounds. Extremities:  Without edema. Neurologic:  Alert and oriented,  grossly normal neurologically. Psych:  Cooperative. Normal mood and affect.  Intake/Output from previous day: 09/03 0701 - 09/04 0700 In: -  Out: 1175 [Urine:1175] Intake/Output this shift: Total I/O In: -  Out: 250 [Urine:250]  Lab Results: Recent Labs    05/06/20 0207 05/07/20 1347 05/08/20 0120  WBC 9.1 6.2 5.5  HGB 11.3* 11.1* 10.4*  HCT 34.4* 33.9* 30.1*  PLT 100* 79* 68*   BMET Recent Labs    05/06/20 1857 05/06/20 2035 05/07/20 1347  NA 135 136 139  K 5.8* 6.0* 5.6*  CL 110 110 111  CO2 16* 16* 18*  GLUCOSE 177* 209* 211*  BUN 78* 75* 61*  CREATININE 2.47* 2.50* 2.09*  CALCIUM 9.0 8.5* 9.1   LFT Recent Labs    05/05/20 1349  PROT 6.8  ALBUMIN 4.0  AST 31  ALT 31  ALKPHOS 57  BILITOT 1.1     Assessment / Plan:      #14 74 year old white male with chronic diarrhea, times present over the past few years. C. difficile quick screen pending-doubt infectious Fecal elastase - not collected Rule out microscopic colitis, IBD, IBS pancreatic insufficiency  Plan is for bowel prep today and colonoscopy tomorrow 05/09/2020 with Dr. Fuller Plan.  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 with Dr. Fuller Plan.  Procedure was discussed in detail with the patient including indications risks and benefits and he is agreeable to proceed.  Hold heparin for 6 hours prior to procedure. Continue to hold Plavix.   Further recommendations pending findings at colonoscopy.    LOS: 3 days   Amy EsterwoodPA-C  05/08/2020, 11:35 AM      Attending Physician Note   I have taken an interval history, reviewed the chart and examined the patient. I agree with the Advanced Practitioner's note, impression and recommendations. Colonoscopy for further evaluation of chronic diarrhea tomorrow. Hold heparin 6 hrs prior to colonoscopy. Continue to hold Plavix.   Lucio Edward, MD [facility]

## 2020-05-08 NOTE — Progress Notes (Signed)
[facility] ROUNDING NOTE   Subjective:   Brief history: 74 year old gentleman with a history of stroke, CAD status post CABG congestive heart failure with systolic dysfunction ejection fraction 25 to 30% status post AICD diabetes mellitus type 2 hypertension paroxysmal atrial fibrillation anticoagulation.  Admitted to the hospital 05/06/2020 with diarrhea that has been chronic and hypotension.  Baseline serum creatinine 1.8 mg/dL.  Found to be hyperkalemic with a potassium of 7.2.  His home medications included lisinopril 20 mg daily as well as spironolactone 50 mg daily and torsemide 40 mg daily.  Blood pressure 138/68 pulse 84 temperature 97.8 urine output 1.9 L 05/07/2020  Sodium 139 potassium 5.6 chloride 111 CO2 18 BUN 61 creatinine 2.09 hemoglobin 10.4  Insulin sliding scale, sodium bicarbonate 650 mg 3 times daily, Lokelma 10 mg 3 times daily  IV heparin   Objective:  Vital signs in last 24 hours:  Temp:  [97.6 F (36.4 C)-97.9 F (36.6 C)] 97.8 F (36.6 C) (09/04 0411) Pulse Rate:  [68-85] 84 (09/04 0411) Resp:  [16-21] 21 (09/04 0411) BP: (117-146)/(51-82) 138/68 (09/04 0411) SpO2:  [96 %] 96 % (09/03 1557)  Weight change:  Filed Weights   05/05/20 1328  Weight: 131.8 kg    Intake/Output: I/O last 3 completed shifts: In: -  Out: 1925 [Urine:1925]   Intake/Output this shift:  No intake/output data recorded.  General: tired-appearing, no acute distress HEENT: anicteric sclera, oropharynx clear without lesions CV: regular rate, normal rhythm, no murmurs, no gallops, no rubs, AICD site left chest wall c/d/i Lungs: clear to auscultation bilaterally, normal work of breathing, bilateral chest expansion, no w/r/r/c Abd: obese, soft, non-tender, non-distended Skin: no visible lesions or rashes Psych: alert, engaged, appropriate mood and affect Musculoskeletal: no edema, no obvious deformities Neuro: normal speech, no gross focal deficits    Basic  Metabolic Panel: Recent Labs  Lab 05/05/20 1349 05/05/20 1349 05/05/20 2231 05/06/20 1125 05/06/20 1125 05/06/20 1857 05/06/20 2035 05/07/20 1347  NA 135   < > 137 136  --  135 136 139  K 7.2*   < > 5.9* 6.9*  --  5.8* 6.0* 5.6*  CL 105   < > 110 110  --  110 110 111  CO2 19*  --   --  13*  --  16* 16* 18*  GLUCOSE 136*   < > 72 160*  --  177* 209* 211*  BUN 100*   < > 119* 86*  --  78* 75* 61*  CREATININE 3.31*   < > 3.30* 2.55*  --  2.47* 2.50* 2.09*  CALCIUM 9.6   < >  --  8.9   < > 9.0 8.5* 9.1   < > = values in this interval not displayed.    Liver Function Tests: Recent Labs  Lab 05/05/20 1349  AST 31  ALT 31  ALKPHOS 57  BILITOT 1.1  PROT 6.8  ALBUMIN 4.0   No results for input(s): LIPASE, AMYLASE in the last 168 hours. No results for input(s): AMMONIA in the last 168 hours.  CBC: Recent Labs  Lab 05/05/20 1349 05/05/20 2231 05/06/20 0207 05/07/20 1347 05/08/20 0120  WBC 9.3  --  9.1 6.2 5.5  NEUTROABS 6.7  --   --   --   --   HGB 12.1* 10.9* 11.3* 11.1* 10.4*  HCT 35.7* 32.0* 34.4* 33.9* 30.1*  MCV 99.2  --  99.7 100.0 98.7  PLT PLATELET CLUMPS NOTED ON SMEAR, UNABLE TO ESTIMATE  --  100* 79* 68*    Cardiac Enzymes: Recent Labs  Lab 05/06/20 1857  CKTOTAL 91    BNP: Invalid input(s): POCBNP  CBG: Recent Labs  Lab 05/06/20 2118 05/07/20 1119 05/07/20 1556 05/07/20 2028 05/08/20 0536  GLUCAP 199* 166* 229* 156* 133*    Microbiology: Results for orders placed or performed during the hospital encounter of 05/05/20  Blood culture (routine x 2)     Status: None (Preliminary result)   Collection Time: 05/05/20  7:20 PM   Specimen: BLOOD  Result Value Ref Range Status   Specimen Description BLOOD LEFT ANTECUBITAL  Final   Special Requests   Final    BOTTLES DRAWN AEROBIC AND ANAEROBIC Blood Culture results may not be optimal due to an excessive volume of blood received in culture bottles   Culture   Final    NO GROWTH 2  DAYS Performed at [facility], [place] [address], [place], [place] [address]    Report Status PENDING  Incomplete  Blood culture (routine x 2)     Status: None (Preliminary result)   Collection Time: 05/05/20  7:28 PM   Specimen: BLOOD RIGHT FOREARM  Result Value Ref Range Status   Specimen Description BLOOD RIGHT FOREARM  Final   Special Requests   Final    BOTTLES DRAWN AEROBIC AND ANAEROBIC Blood Culture adequate volume   Culture   Final    NO GROWTH 2 DAYS Performed at [facility], [place] [address] [place], [place] [address]    Report Status PENDING  Incomplete  SARS Coronavirus 2 by RT PCR (hospital order, performed in [facility] hospital lab) Nasopharyngeal Nasopharyngeal Swab     Status: None   Collection Time: 05/05/20  7:32 PM   Specimen: Nasopharyngeal Swab  Result Value Ref Range Status   SARS Coronavirus 2 NEGATIVE NEGATIVE Final    Comment: (NOTE) SARS-CoV-2 target nucleic acids are NOT DETECTED.  The SARS-CoV-2 RNA is generally detectable in upper and lower respiratory specimens during the acute phase of infection. The lowest concentration of SARS-CoV-2 viral copies this assay can detect is 250 copies / mL. A negative result does not preclude SARS-CoV-2 infection and should not be used as the sole basis for treatment or other patient management decisions.  A negative result may occur with improper specimen collection / handling, submission of specimen other than nasopharyngeal swab, presence of viral mutation(s) within the areas targeted by this assay, and inadequate number of viral copies (<250 copies / mL). A negative result must be combined with clinical observations, patient history, and epidemiological information.  Fact Sheet for Patients:   StrictlyIdeas.no  Fact Sheet for Healthcare Providers: BankingDealers.co.za  This test is not yet approved or  cleared by the [place] FDA and has been  authorized for detection and/or diagnosis of SARS-CoV-2 by FDA under an Emergency Use Authorization (EUA).  This EUA will remain in effect (meaning this test can be used) for the duration of the COVID-19 declaration under Section 564(b)(1) of the Act, 21 U.S.C. section 360bbb-3(b)(1), unless the authorization is terminated or revoked sooner.  Performed at [facility], [place] [address], [place], [place] [address]   Gastrointestinal Panel by PCR , Stool     Status: None   Collection Time: 05/06/20 12:20 AM   Specimen: Stool  Result Value Ref Range Status   Campylobacter species NOT DETECTED NOT DETECTED Final   Plesimonas shigelloides NOT DETECTED NOT DETECTED Final   Salmonella species NOT DETECTED  NOT DETECTED Final   Yersinia enterocolitica NOT DETECTED NOT DETECTED Final   Vibrio species NOT DETECTED NOT DETECTED Final   Vibrio cholerae NOT DETECTED NOT DETECTED Final   Enteroaggregative E coli (EAEC) NOT DETECTED NOT DETECTED Final   Enteropathogenic E coli (EPEC) NOT DETECTED NOT DETECTED Final   Enterotoxigenic E coli (ETEC) NOT DETECTED NOT DETECTED Final   Shiga like toxin producing E coli (STEC) NOT DETECTED NOT DETECTED Final   Shigella/Enteroinvasive E coli (EIEC) NOT DETECTED NOT DETECTED Final   Cryptosporidium NOT DETECTED NOT DETECTED Final   Cyclospora cayetanensis NOT DETECTED NOT DETECTED Final   Entamoeba histolytica NOT DETECTED NOT DETECTED Final   Giardia lamblia NOT DETECTED NOT DETECTED Final   Adenovirus F40/41 NOT DETECTED NOT DETECTED Final   Astrovirus NOT DETECTED NOT DETECTED Final   Norovirus GI/GII NOT DETECTED NOT DETECTED Final   Rotavirus A NOT DETECTED NOT DETECTED Final   Sapovirus (I, II, IV, and V) NOT DETECTED NOT DETECTED Final    Comment: Performed at [facility], [place] [place], [place] [address]    Coagulation Studies: No results for input(s): LABPROT, INR in the last 72 hours.  Urinalysis: Recent Labs     05/05/20 1949  COLORURINE YELLOW  LABSPEC 1.009  PHURINE 5.0  GLUCOSEU NEGATIVE  HGBUR NEGATIVE  BILIRUBINUR NEGATIVE  KETONESUR NEGATIVE  PROTEINUR NEGATIVE  NITRITE NEGATIVE  LEUKOCYTESUR NEGATIVE      Imaging: No results found.   Medications:   . sodium chloride Stopped (05/07/20 1245)  . heparin 1,700 Units/hr (05/08/20 0510)  .  sodium bicarbonate (isotonic) infusion in sterile water 100 mL/hr at 05/07/20 1244   . bisacodyl  20 mg Oral Once  . insulin aspart  0-20 Units Subcutaneous TID WC  . insulin aspart  8 Units Subcutaneous TID WC  . insulin glargine  20 Units Subcutaneous QHS  . metoCLOPramide (REGLAN) injection  10 mg Intravenous Once   Followed by  . metoCLOPramide (REGLAN) injection  10 mg Intravenous Once  . peg 3350 powder  0.5 kit Oral Once   And  . peg 3350 powder  0.5 kit Oral Once  . sodium zirconium cyclosilicate  10 g Oral TID   acetaminophen **OR** acetaminophen, ondansetron **OR** ondansetron (ZOFRAN) IV  Assessment/ Plan:  AKI on CKD3 (improved): Appears to be improving.  We will continue IV fluids and holding ACE inhibitor as well as potassium sparing diuretic  Hyperkalemia Agree with holding ACE inhibitor and potassium sparing diuretic.  Continue IV sodium bicarbonate 100 cc an hour  Metabolic acidosis, non-anion gap -Continue IV sodium bicarbonate 100 cc/h  Renal cyst -lower pole right kidney has a partially exophytic cyst with thin internal septation and questionable mural nodularity. Will need further evaluation with a MRI with and without contrast (2nd or 3rd contrast agent) which can be done as an outpatient  Hypertension: -We will continue to follow continue to hold ACE inhibitor and spironolactone  Anemia due to chronic kidney disease: Not an issue at this time   Diarrhea -GI on board, patient refuses c-scope  Uncontrolled Diabetes Mellitus Type 2 with Hyperglycemia -mgmt per primary service  HFrEF s/p AICD, EF  25-30%    LOS: [place] _0 _1 :45 AM

## 2020-05-08 NOTE — Progress Notes (Signed)
ANTICOAGULATION CONSULT NOTE  Pharmacy Consult for IV heparin Indication: afib, Eliquis on hold  Allergies  Allergen Reactions   Tape Other (See Comments)    Causes skin irritation    Patient Measurements: Height: 6' (182.9 cm) Weight: 131.8 kg (290 lb 9.1 oz) IBW/kg (Calculated) : 77.6 Heparin Dosing Weight: 107 kg  Vital Signs: Temp: 98.2 F (36.8 C) (09/04 1935) Temp Source: Oral (09/04 1935) BP: 144/75 (09/04 1935) Pulse Rate: 63 (09/04 1935)  Labs: Recent Labs    05/06/20 0207 05/06/20 1125 05/06/20 1126 05/06/20 1857 05/06/20 2035 05/07/20 1347 05/08/20 0120 05/08/20 1131 05/08/20 2021  HGB 11.3*  --   --   --   --  11.1* 10.4*  --   --   HCT 34.4*  --   --   --   --  33.9* 30.1*  --   --   PLT 100*  --   --   --   --  79* 68*  --   --   APTT 31   < >   < >  --  65* 46* 57* 78*  --   HEPARINUNFRC >2.20*   < >   < >  --   --  0.64 0.46  --  0.46  CREATININE  --    < >  --  2.47* 2.50* 2.09*  --  1.83*  --   CKTOTAL  --   --   --  91  --   --   --   --   --    < > = values in this interval not displayed.    Estimated Creatinine Clearance: 48.2 mL/min (A) (by C-G formula based on SCr of 1.83 mg/dL (H)).   Assessment: 74 yo male admitted with suspicion for GI bleed and AKI.  On Eliquis PTA, unable to confirm timing of last dose. Noted plans for colonoscopy on 9/5.  -Heparin level therapeutic at 1700 units/hr.  Goal of Therapy:  Heparin level 0.3-0.5 - will target lower end given ?GIB Monitor platelets by anticoagulation protocol: Yes   Plan:  Continue heparin at 1700 units/hr Stop heparin at 4am for colonoscopy (planned at 10am)  Hildred Laser, PharmD Clinical Pharmacist **Pharmacist phone directory can now be found on [email or web address] ([email or web address]).  Listed under [place].

## 2020-05-08 NOTE — Progress Notes (Signed)
[facility]  PROGRESS NOTE  Vernon Briggs JOA:[id number] DOB: Dec 27, 1945 DOA: 05/05/2020 PCP: [name], MD   Brief HPI:   74 year old male with medical history of stroke, CAD s/p CABG, HFr EF, 25 to 30%, status post AICD, CKD stage III, diabetes mellitus type 2, hypertension, paroxysmal atrial fibrillation on Eliquis was brought to hospital for hypotension and diarrhea.  Patient has history of chronic diarrhea for several years usually gets 1-3 stools per day.  Over the past few days he has been intermittently hypotensive.  Home health RN noted that he was hypotensive with SBP 60s at home and called EMS. In the ED patient's creatinine was found to be 3.3 up from 1.8 at baseline.  Initial potassium was 7.2 which improved to 5.9 with IV fluid bolus.   Subjective   Patient seen and examined, had one episode of loose BM this morning.  Denies abdominal pain.  No nausea or vomiting.   Assessment/Plan:     1. Acute kidney injury on CKD stage III-likely prerenal from dehydration from ongoing diarrhea.  Renal ultrasound shows no hydronephrosis, large partially exophytic cyst arising from the lower pole right kidney demonstrates some thin internal septation and questionable mural nodularity.  No definitive evaluation from MR imaging on a nonemergent basis can be done as outpatient.  Creatinine has improved to 2.50 this morning.  Follow BMP in a.m. 2. Hypotension-resolved chronically from dehydration.  Antihypertensive medications are currently on hold. 3. Chronic diarrhea/positive FOBT-patient has history of chronic diarrhea 1-3 loose stools daily for past 20 years.  He had one episode of loose BM this morning.  He had colonoscopy 10 years ago, GI was consulted.  Patient agrees to undergo colonoscopy.  Plan for colonoscopy on Sunday.  Plavix is currently on hold.   4. Hyperkalemia-potassium was elevated yesterday at 7.2.  Potassium improved to 5.9 with IV fluids.  Potassium went up to 6.9  yesterday so nephrology was consulted.  Currently patient is on Lokelma 10 g 3 times daily.  Check BMP today.  Nephrology following.    Continue to hold ACE inhibitor and spironolactone. 5. Diabetes mellitus type 2-CBG well controlled, continue Lantus 20 units subcu daily, NovoLog 8 units 3 times daily meal coverage, resistant sliding scale insulin with NovoLog. 6. PAF-continue heparin GTT for anticoagulation, continue Coreg.  Heart rate is well controlled. 7. Hypertension-diuretics currently on hold. 8. CAD s/p CABG-EF 25 to 30%, Plavix is currently on hold.  Coreg is on hold due to hypotension.     COVID-19 Labs  No results for input(s): DDIMER, FERRITIN, LDH, CRP in the last 72 hours.  Lab Results  Component Value Date   SARSCOV2NAA NEGATIVE 05/05/2020   [place] NEGATIVE 11/19/2019     Scheduled medications:    bisacodyl  20 mg Oral Once   insulin aspart  0-20 Units Subcutaneous TID WC   insulin aspart  8 Units Subcutaneous TID WC   insulin glargine  20 Units Subcutaneous QHS   metoCLOPramide (REGLAN) injection  10 mg Intravenous Once   Followed by   metoCLOPramide (REGLAN) injection  10 mg Intravenous Once   peg 3350 powder  0.5 kit Oral Once   And   peg 3350 powder  0.5 kit Oral Once   sodium zirconium cyclosilicate  10 g Oral TID         CBG: Recent Labs  Lab 05/06/20 2118 05/07/20 1119 05/07/20 1556 05/07/20 2028 05/08/20 0536  GLUCAP 199* 166* 229* 156* 133*    SpO2: 100 %  O2 Flow Rate (L/min): 2 L/min    CBC: Recent Labs  Lab 05/05/20 1349 05/05/20 2231 05/06/20 0207 05/07/20 1347 05/08/20 0120  WBC 9.3  --  9.1 6.2 5.5  NEUTROABS 6.7  --   --   --   --   HGB 12.1* 10.9* 11.3* 11.1* 10.4*  HCT 35.7* 32.0* 34.4* 33.9* 30.1*  MCV 99.2  --  99.7 100.0 98.7  PLT PLATELET CLUMPS NOTED ON SMEAR, UNABLE TO ESTIMATE  --  100* 79* 68*    Basic Metabolic Panel: Recent Labs  Lab 05/05/20 1349 05/05/20 1349 05/05/20 2231  05/06/20 1125 05/06/20 1857 05/06/20 2035 05/07/20 1347  NA 135   < > 137 136 135 136 139  K 7.2*   < > 5.9* 6.9* 5.8* 6.0* 5.6*  CL 105   < > 110 110 110 110 111  CO2 19*  --   --  13* 16* 16* 18*  GLUCOSE 136*   < > 72 160* 177* 209* 211*  BUN 100*   < > 119* 86* 78* 75* 61*  CREATININE 3.31*   < > 3.30* 2.55* 2.47* 2.50* 2.09*  CALCIUM 9.6  --   --  8.9 9.0 8.5* 9.1   < > = values in this interval not displayed.     Liver Function Tests: Recent Labs  Lab 05/05/20 1349  AST 31  ALT 31  ALKPHOS 57  BILITOT 1.1  PROT 6.8  ALBUMIN 4.0     Antibiotics: Anti-infectives (From admission, onward)   None       DVT prophylaxis: Heparin  Code Status: DNR  Family Communication: No family at bedside    Status is: Inpatient  Dispo: The patient is from: Home              Anticipated d/c is to: Home              Anticipated d/c date is: 05/10/2020              Patient currently not medically stable for discharge  Barrier to discharge-ongoing diarrhea, hypotension, hyperkalemia          Consultants:    Procedures:     Objective   Vitals:   05/07/20 2004 05/07/20 2358 05/08/20 0411 05/08/20 0902  BP: (!) 146/82 135/64 138/68 (!) 146/58  Pulse: 84 68 84 68  Resp: 18 16 (!) 21 20  Temp: 97.8 F (36.6 C) 97.9 F (36.6 C) 97.8 F (36.6 C) 98.1 F (36.7 C)  TempSrc: Oral Oral Oral Oral  SpO2:    100%  Weight:      Height:        Intake/Output Summary (Last 24 hours) at 05/08/2020 0998 Last data filed at 05/07/2020 2001 Gross per 24 hour  Intake --  Output 1175 ml  Net -1175 ml    09/02 1901 - 09/04 0700 In: -  Out: 1925 [[id number]]  Filed Weights   05/05/20 1328  Weight: 131.8 kg    Physical Examination:   General-appears in no acute distress Heart-S1-S2, regular, no murmur auscultated Lungs-clear to auscultation bilaterally, no wheezing or crackles auscultated Abdomen-soft, nontender, no organomegaly Extremities-no edema in the  lower extremities Neuro-alert, oriented x3, no focal deficit noted   Data Reviewed:   Recent Results (from the past 240 hour(s))  Blood culture (routine x 2)     Status: None (Preliminary result)   Collection Time: 05/05/20  7:20 PM   Specimen: BLOOD  Result Value Ref Range  Status   Specimen Description BLOOD LEFT ANTECUBITAL  Final   Special Requests   Final    BOTTLES DRAWN AEROBIC AND ANAEROBIC Blood Culture results may not be optimal due to an excessive volume of blood received in culture bottles   Culture   Final    NO GROWTH 2 DAYS Performed at [facility], [place] [address] [place], [place] [address]    Report Status PENDING  Incomplete  Blood culture (routine x 2)     Status: None (Preliminary result)   Collection Time: 05/05/20  7:28 PM   Specimen: BLOOD RIGHT FOREARM  Result Value Ref Range Status   Specimen Description BLOOD RIGHT FOREARM  Final   Special Requests   Final    BOTTLES DRAWN AEROBIC AND ANAEROBIC Blood Culture adequate volume   Culture   Final    NO GROWTH 2 DAYS Performed at [facility], [place] [address] [place], [place] [address]    Report Status PENDING  Incomplete  SARS Coronavirus 2 by RT PCR (hospital order, performed in [facility] hospital lab) Nasopharyngeal Nasopharyngeal Swab     Status: None   Collection Time: 05/05/20  7:32 PM   Specimen: Nasopharyngeal Swab  Result Value Ref Range Status   SARS Coronavirus 2 NEGATIVE NEGATIVE Final    Comment: (NOTE) SARS-CoV-2 target nucleic acids are NOT DETECTED.  The SARS-CoV-2 RNA is generally detectable in upper and lower respiratory specimens during the acute phase of infection. The lowest concentration of SARS-CoV-2 viral copies this assay can detect is 250 copies / mL. A negative result does not preclude SARS-CoV-2 infection and should not be used as the sole basis for treatment or other patient management decisions.  A negative result may occur with improper specimen collection /  handling, submission of specimen other than nasopharyngeal swab, presence of viral mutation(s) within the areas targeted by this assay, and inadequate number of viral copies (<250 copies / mL). A negative result must be combined with clinical observations, patient history, and epidemiological information.  Fact Sheet for Patients:   StrictlyIdeas.no  Fact Sheet for Healthcare Providers: BankingDealers.co.za  This test is not yet approved or  cleared by the [place] FDA and has been authorized for detection and/or diagnosis of SARS-CoV-2 by FDA under an Emergency Use Authorization (EUA).  This EUA will remain in effect (meaning this test can be used) for the duration of the COVID-19 declaration under Section 564(b)(1) of the Act, 21 U.S.C. section 360bbb-3(b)(1), unless the authorization is terminated or revoked sooner.  Performed at [facility], [place] [address] [place], [place] [address]   Gastrointestinal Panel by PCR , Stool     Status: None   Collection Time: 05/06/20 12:20 AM   Specimen: Stool  Result Value Ref Range Status   Campylobacter species NOT DETECTED NOT DETECTED Final   Plesimonas shigelloides NOT DETECTED NOT DETECTED Final   Salmonella species NOT DETECTED NOT DETECTED Final   Yersinia enterocolitica NOT DETECTED NOT DETECTED Final   Vibrio species NOT DETECTED NOT DETECTED Final   Vibrio cholerae NOT DETECTED NOT DETECTED Final   Enteroaggregative E coli (EAEC) NOT DETECTED NOT DETECTED Final   Enteropathogenic E coli (EPEC) NOT DETECTED NOT DETECTED Final   Enterotoxigenic E coli (ETEC) NOT DETECTED NOT DETECTED Final   Shiga like toxin producing E coli (STEC) NOT DETECTED NOT DETECTED Final   Shigella/Enteroinvasive E coli (EIEC) NOT DETECTED NOT DETECTED Final   Cryptosporidium NOT DETECTED NOT DETECTED  Final   Cyclospora cayetanensis NOT DETECTED NOT DETECTED Final   Entamoeba histolytica NOT  DETECTED NOT DETECTED Final   Giardia lamblia NOT DETECTED NOT DETECTED Final   Adenovirus F40/41 NOT DETECTED NOT DETECTED Final   Astrovirus NOT DETECTED NOT DETECTED Final   Norovirus GI/GII NOT DETECTED NOT DETECTED Final   Rotavirus A NOT DETECTED NOT DETECTED Final   Sapovirus (I, II, IV, and V) NOT DETECTED NOT DETECTED Final    Comment: Performed at [facility], [address] [place], [place] [address]    No results for input(s): LIPASE, AMYLASE in the last 168 hours. No results for input(s): AMMONIA in the last 168 hours.  Cardiac Enzymes: Recent Labs  Lab 05/06/20 1857  CKTOTAL 91   BNP (last 3 results) No results for input(s): BNP in the last 8760 hours.  ProBNP (last 3 results) No results for input(s): PROBNP in the last 8760 hours.  Studies:  No results found.     Oswald Hillock   [facility] If 7PM-7AM, please contact night-coverage at [email or web address], Office  [phone]   05/08/2020, 9:25 AM  LOS: 3 days

## 2020-05-08 NOTE — Progress Notes (Signed)
[place] for IV heparin Indication: afib, Eliquis on hold  Allergies  Allergen Reactions  . Tape Other (See Comments)    Causes skin irritation    Patient Measurements: Height: 6' (182.9 cm) Weight: 131.8 kg (290 lb 9.1 oz) IBW/kg (Calculated) : 77.6 Heparin Dosing Weight: 107 kg  Vital Signs: Temp: 97.9 F (36.6 C) (09/03 2358) Temp Source: Oral (09/03 2358) BP: 135/64 (09/03 2358) Pulse Rate: 68 (09/03 2358)  Labs: Recent Labs    05/06/20 0207 05/06/20 0207 05/06/20 1125 05/06/20 1126 05/06/20 1857 05/06/20 2035 05/07/20 1347 05/08/20 0120  HGB 11.3*   < >  --   --   --   --  11.1* 10.4*  HCT 34.4*  --   --   --   --   --  33.9* 30.1*  PLT 100*  --   --   --   --   --  79* 68*  APTT 31  --    < >  --   --  65* 46* 57*  HEPARINUNFRC >2.20*  --   --  >2.20*  --   --  0.64 0.46  CREATININE  --   --    < >  --  2.47* 2.50* 2.09*  --   CKTOTAL  --   --   --   --  91  --   --   --    < > = values in this interval not displayed.    Estimated Creatinine Clearance: 42.2 mL/min (A) (by C-G formula based on SCr of 2.09 mg/dL (H)).    Assessment: 74 yo male admitted with suspicion for GI bleed and AKI.  On Eliquis PTA, unable to confirm timing of last dose. Noted plans for colonoscopy on 9/5.   Heparin level elevated with recent Eliquis so utilizing PTT until levels correlate. PTT 57 sec (subtherapeutic) on gtt at 1550 units/hr. No issues with line or bleeding reported per RN.  Goal of Therapy:  Heparin level 0.3-0.5 - will target lower end given ?GIB APTT 66-85 sec Monitor platelets by anticoagulation protocol: Yes   Plan:  Increase heparin gtt to 1700 units/hr APTT in 8 hrs  Sherlon Handing, PharmD, BCPS Please see amion for complete clinical pharmacist phone list 05/08/2020 2:36 AM

## 2020-05-09 ENCOUNTER — Encounter (HOSPITAL_COMMUNITY)
Admission: EM | Disposition: A | Discharge status: Home / Self Care | Payer: Self-pay | Source: Home / Self Care | Attending: Family Medicine

## 2020-05-09 ENCOUNTER — Encounter (HOSPITAL_COMMUNITY): Payer: Self-pay | Admitting: Internal Medicine

## 2020-05-09 ENCOUNTER — Inpatient Hospital Stay (HOSPITAL_COMMUNITY): Payer: No Typology Code available for payment source | Admitting: Anesthesiology

## 2020-05-09 DIAGNOSIS — D122 Benign neoplasm of ascending colon: Secondary | ICD-10-CM

## 2020-05-09 DIAGNOSIS — D123 Benign neoplasm of transverse colon: Secondary | ICD-10-CM

## 2020-05-09 DIAGNOSIS — D124 Benign neoplasm of descending colon: Secondary | ICD-10-CM

## 2020-05-09 DIAGNOSIS — D128 Benign neoplasm of rectum: Secondary | ICD-10-CM

## 2020-05-09 HISTORY — PX: COLONOSCOPY WITH PROPOFOL: SHX5780

## 2020-05-09 HISTORY — PX: POLYPECTOMY: SHX5525

## 2020-05-09 HISTORY — PX: BIOPSY: SHX5522

## 2020-05-09 LAB — BASIC METABOLIC PANEL
Anion gap: 10 (ref 5–15)
BUN: 30 mg/dL — ABNORMAL HIGH (ref 8–23)
CO2: 25 mmol/L (ref 22–32)
Calcium: 8.7 mg/dL — ABNORMAL LOW (ref 8.9–10.3)
Chloride: 106 mmol/L (ref 98–111)
Creatinine, Ser: 1.66 mg/dL — ABNORMAL HIGH (ref 0.61–1.24)
GFR calc Af Amer: 46 mL/min — ABNORMAL LOW (ref >60)
GFR calc non Af Amer: 39 mL/min — ABNORMAL LOW (ref >60)
Glucose, Bld: 171 mg/dL — ABNORMAL HIGH (ref 70–99)
Potassium: 4.1 mmol/L (ref 3.5–5.1)
Sodium: 141 mmol/L (ref 135–145)

## 2020-05-09 LAB — GLUCOSE, CAPILLARY
Glucose-Capillary: 161 mg/dL — ABNORMAL HIGH (ref 70–99)
Glucose-Capillary: 181 mg/dL — ABNORMAL HIGH (ref 70–99)
Glucose-Capillary: 170 mg/dL — ABNORMAL HIGH (ref 70–99)
Glucose-Capillary: 185 mg/dL — ABNORMAL HIGH (ref 70–99)
Glucose-Capillary: 175 mg/dL — ABNORMAL HIGH (ref 70–99)

## 2020-05-09 LAB — CBC
HCT: 30.6 % — ABNORMAL LOW (ref 39.0–52.0)
Hemoglobin: 10.3 g/dL — ABNORMAL LOW (ref 13.0–17.0)
MCH: 32.9 pg (ref 26.0–34.0)
MCHC: 33.7 g/dL (ref 30.0–36.0)
MCV: 97.8 fL (ref 80.0–100.0)
Platelets: 60 10*3/uL — ABNORMAL LOW (ref 150–400)
RBC: 3.13 MIL/uL — ABNORMAL LOW (ref 4.22–5.81)
RDW: 14.2 % (ref 11.5–15.5)
WBC: 5.1 10*3/uL (ref 4.0–10.5)
nRBC: 0 % (ref 0.0–0.2)

## 2020-05-09 SURGERY — COLONOSCOPY WITH PROPOFOL
Anesthesia: Monitor Anesthesia Care

## 2020-05-09 MED ORDER — OXYCODONE HCL 5 MG/5ML PO SOLN: 5.0000 mg | Freq: Once | ORAL | Status: DC | PRN

## 2020-05-09 MED ORDER — OXYCODONE HCL 5 MG PO TABS: 5.0000 mg | ORAL_TABLET | Freq: Once | ORAL | Status: DC | PRN

## 2020-05-09 MED ORDER — PROPOFOL 10 MG/ML IV BOLUS
INTRAVENOUS | Status: DC | PRN
  Administered 2020-05-09: 20 mg via INTRAVENOUS

## 2020-05-09 MED ORDER — FENTANYL CITRATE (PF) 100 MCG/2ML IJ SOLN: 25.0000 ug | INTRAMUSCULAR | Status: DC | PRN

## 2020-05-09 MED ORDER — SODIUM CHLORIDE 0.9 % IV SOLN: INTRAVENOUS | Status: DC | PRN

## 2020-05-09 MED ORDER — ONDANSETRON HCL 4 MG/2ML IJ SOLN: 4.0000 mg | Freq: Four times a day (QID) | INTRAMUSCULAR | Status: DC | PRN

## 2020-05-09 MED ORDER — PHENYLEPHRINE HCL (PRESSORS) 10 MG/ML IV SOLN
INTRAVENOUS | Status: DC | PRN
  Administered 2020-05-09 (×4): 120 ug via INTRAVENOUS

## 2020-05-09 MED ORDER — PROPOFOL 500 MG/50ML IV EMUL
INTRAVENOUS | Status: DC | PRN
  Administered 2020-05-09: 100 ug/kg/min via INTRAVENOUS

## 2020-05-09 MED ORDER — HEPARIN (PORCINE) 25000 UT/250ML-% IV SOLN
1700.0000 [IU]/h | INTRAVENOUS | Status: DC
  Administered 2020-05-10 (×2): 1700 [IU]/h via INTRAVENOUS
  Filled 2020-05-09: qty 250

## 2020-05-09 MED ORDER — ONDANSETRON HCL 4 MG/2ML IJ SOLN
INTRAMUSCULAR | Status: DC | PRN
  Administered 2020-05-09: 4 mg via INTRAVENOUS

## 2020-05-09 SURGICAL SUPPLY — 22 items

## 2020-05-09 NOTE — Interval H&P Note (Signed)
History and Physical Interval Note:  05/09/2020 10:08 AM  Vernon Briggs  has presented today for surgery, with the diagnosis of intermittent loose, incontinent, non-bloody stools.  remote xx large colon polyps w no subsequent sureveillance per wifes report.  The various methods of treatment have been discussed with the patient and family. After consideration of risks, benefits and other options for treatment, the patient has consented to  Procedure(s): COLONOSCOPY WITH PROPOFOL (N/A) as a surgical intervention.  The patient's history has been reviewed, patient examined, no change in status, stable for surgery.  I have reviewed the patient's chart and labs.  Questions were answered to the patient's satisfaction.     Pricilla Riffle. Fuller Plan

## 2020-05-09 NOTE — Anesthesia Procedure Notes (Signed)
Procedure Name: MAC Date/Time: 05/09/2020 10:19 AM Performed by: Inda Coke, CRNA Pre-anesthesia Checklist: Patient identified, Emergency Drugs available, Suction available, Timeout performed and Patient being monitored Patient Re-evaluated:Patient Re-evaluated prior to induction Oxygen Delivery Method: Nasal cannula Induction Type: IV induction Dental Injury: Teeth and Oropharynx as per pre-operative assessment

## 2020-05-09 NOTE — Op Note (Addendum)
[facility] Patient Name: Vernon Briggs Procedure Date : 05/09/2020 MRN: [id number] Attending MD: Ladene Artist , MD Date of Birth: 12/15/1945 CSN: [id number] Age: 74 Admit Type: Inpatient Procedure:                Colonoscopy Indications:              Chronic diarrhea, Heme positive stool Providers:                Pricilla Riffle. Fuller Plan, MD, Angus Seller, Tyrone Apple, Technician, Rejeana Brock, CRNA Referring MD:             [facility] Medicines:                Monitored Anesthesia Care Complications:            No immediate complications. Estimated blood loss:                            None. Estimated Blood Loss:     Estimated blood loss: none. Procedure:                Pre-Anesthesia Assessment:                           - Prior to the procedure, a History and Physical                            was performed, and patient medications and                            allergies were reviewed. The patient's tolerance of                            previous anesthesia was also reviewed. The risks                            and benefits of the procedure and the sedation                            options and risks were discussed with the patient.                            All questions were answered, and informed consent                            was obtained. Prior Anticoagulants: The patient has                            taken Plavix (clopidogrel) and Eliquis, last doses                            were 5 days prior to procedure. IV heparin stopped  6 hours prior to the procedure. ASA Grade                            Assessment: III - A patient with severe systemic                            disease. After reviewing the risks and benefits,                            the patient was deemed in satisfactory condition to                            undergo the procedure.                           After obtaining informed  consent, the colonoscope                            was passed under direct vision. Throughout the                            procedure, the patient's blood pressure, pulse, and                            oxygen saturations were monitored continuously. The                            CF-HQ190L ([id number]) Olympus colonoscope was                            introduced through the anus and advanced to the the                            cecum, identified by appendiceal orifice and                            ileocecal valve. The ileocecal valve, appendiceal                            orifice, and rectum were photographed. The quality                            of the bowel preparation was adequate to identify                            polyps 6 mm and larger in size. Extensive lavage                            and suction to acheive adequate prep. The                            colonoscopy was performed without difficulty. The  patient tolerated the procedure well. Scope In: 10:28:50 AM Scope Out: 10:58:52 AM Scope Withdrawal Time: 0 hours 27 minutes 55 seconds  Total Procedure Duration: 0 hours 30 minutes 2 seconds  Findings:      The perianal and digital rectal examinations were normal.      Ten sessile and semi-pedunculated polyps were found in the rectum (1),       descending colon (4), transverse colon (4) and ascending colon (1). The       polyps were 6 to 14 mm in size. These polyps were removed with a cold       snare. Resection and retrieval were complete.      A few medium-mouthed diverticula were found in the left colon. There was       no evidence of diverticular bleeding.      Internal hemorrhoids were found during retroflexion. The hemorrhoids       were small and Grade I (internal hemorrhoids that do not prolapse).      The exam was otherwise without abnormality on direct and retroflexion       views. Random biopsies obtained throughout. Impression:                - Ten 6 to 14 mm polyps in the rectum, in the                            descending colon, in the transverse colon and in                            the ascending colon, removed with a cold snare.                            Resected and retrieved.                           - Mild diverticulosis in the left colon.                           - Internal hemorrhoids.                           - The examination was otherwise normal on direct                            and retroflexion views. Random biopsies obtained. Recommendation:           - Consdier repeat colonoscopy after studies are                            complete for surveillance based on pathology                            results with Dr. Silverio Decamp or [facility] hospital                            gastroenterologist. May defer future surveillance                            colonoscopies due  to age, comorbid conditions.                           - Resume Plavix (clopidogrel) in 5 days at prior                            dose as indicated. Resume Eliquis in 3 days at                            prior dose as indicated. Resume IV heparin in 12                            hours as indicated. Refer to managing physician for                            further adjustment of therapy.                           - Patient has a contact number available for                            emergencies. The signs and symptoms of potential                            delayed complications were discussed with the                            patient. Return to normal activities tomorrow.                            Written discharge instructions were provided to the                            patient.                           - Resume previous diet.                           - Continue present medications.                           - Await pathology results.                           - No aspirin, ibuprofen, naproxen, or other                             non-steroidal anti-inflammatory drugs for 2 weeks                            after polyp removal. Procedure Code(s):        --- Professional ---                           [phone], Colonoscopy,  flexible; with removal of                            tumor(s), polyp(s), or other lesion(s) by snare                            technique Diagnosis Code(s):        --- Professional ---                           K62.1, Rectal polyp                           K63.5, Polyp of colon                           K64.0, First degree hemorrhoids                           K52.9, Noninfective gastroenteritis and colitis,                            unspecified                           K57.30, Diverticulosis of large intestine without                            perforation or abscess without bleeding CPT copyright 2019 American Medical Association. All rights reserved. The codes documented in this report are preliminary and upon coder review may  be revised to meet current compliance requirements. Ladene Artist, MD 05/09/2020 11:32:29 AM This report has been signed electronically. Number of Addenda: 0

## 2020-05-09 NOTE — Anesthesia Preprocedure Evaluation (Signed)
Anesthesia Evaluation  Patient identified by MRN, date of birth, ID band Patient awake    Reviewed: Allergy & Precautions, H&P , NPO status , Patient's Chart, lab work & pertinent test results  Airway Mallampati: II   Neck ROM: full    Dental   Pulmonary sleep apnea , former smoker,    breath sounds clear to auscultation       Cardiovascular hypertension, + CAD, + Past MI, + Cardiac Stents, + CABG and +CHF  + pacemaker + Cardiac Defibrillator  Rhythm:regular Rate:Normal  EF 25-30%   Neuro/Psych PSYCHIATRIC DISORDERS Anxiety Depression CVA    GI/Hepatic hiatal hernia, GERD  ,  Endo/Other  diabetes, Type 2  Renal/GU Renal InsufficiencyRenal disease     Musculoskeletal  (+) Arthritis ,   Abdominal   Peds  Hematology  (+) anemia , PLTS 60   Anesthesia Other Findings   Reproductive/Obstetrics                             Anesthesia Physical Anesthesia Plan  ASA: III  Anesthesia Plan: MAC   Post-op Pain Management:    Induction: Intravenous  PONV Risk Score and Plan: 1 and Propofol infusion and Treatment may vary due to age or medical condition  Airway Management Planned: Nasal Cannula  Additional Equipment:   Intra-op Plan:   Post-operative Plan:   Informed Consent: I have reviewed the patients History and Physical, chart, labs and discussed the procedure including the risks, benefits and alternatives for the proposed anesthesia with the patient or authorized representative who has indicated his/her understanding and acceptance.       Plan Discussed with: CRNA, Anesthesiologist and Surgeon  Anesthesia Plan Comments:         Anesthesia Quick Evaluation

## 2020-05-09 NOTE — Progress Notes (Signed)
[place] for IV heparin Indication: afib, Eliquis on hold  Allergies  Allergen Reactions  . Tape Other (See Comments)    Causes skin irritation    Patient Measurements: Height: 6' (182.9 cm) Weight: 131.8 kg (290 lb 9.1 oz) IBW/kg (Calculated) : 77.6 Heparin Dosing Weight: 107 kg  Vital Signs: Temp: 98.2 F (36.8 C) (09/05 1125) Temp Source: Oral (09/05 1125) BP: 144/55 (09/05 1125) Pulse Rate: 75 (09/05 1125)  Labs: Recent Labs    05/06/20 1857 05/06/20 2035 05/07/20 1347 05/07/20 1347 05/08/20 0120 05/08/20 1131 05/08/20 2021 05/09/20 0709  HGB  --   --  11.1*   < > 10.4*  --   --  10.3*  HCT  --   --  33.9*  --  30.1*  --   --  30.6*  PLT  --   --  79*  --  68*  --   --  60*  APTT  --    < > 46*  --  57* 78*  --   --   HEPARINUNFRC  --   --  0.64  --  0.46  --  0.46  --   CREATININE 2.47*   < > 2.09*  --   --  1.83*  --  1.66*  CKTOTAL 91  --   --   --   --   --   --   --    < > = values in this interval not displayed.    Estimated Creatinine Clearance: 53.2 mL/min (A) (by C-G formula based on SCr of 1.66 mg/dL (H)).   Assessment: 74 yo male admitted with suspicion for GI bleed and AKI.  On Eliquis PTA, unable to confirm timing of last dose. He is now s/p colonoscopy with removal of 6 polyps. Per GI note restart heparin in 12 hours, apixaban in 3 days and plavix in 5 days.   Goal of Therapy:  Heparin level 0.3-0.5 - will target lower end given ?GIB Monitor platelets by anticoagulation protocol: Yes   Plan:  -resume heparin at 1700 units/hr at 11pm -Heparin level in 6 hours and daily wth CBC daily    Hildred Laser, PharmD Clinical Pharmacist **Pharmacist phone directory can now be found on [email or web address] ([email or web address]).  Listed under [place].

## 2020-05-09 NOTE — Progress Notes (Signed)
[facility]  PROGRESS NOTE  Vernon Briggs VOH:[id number] DOB: 1946-04-02 DOA: 05/05/2020 PCP: [name], MD   Brief HPI:   74 year old male with medical history of stroke, CAD s/p CABG, HFr EF, 25 to 30%, status post AICD, CKD stage III, diabetes mellitus type 2, hypertension, paroxysmal atrial fibrillation on Eliquis was brought to hospital for hypotension and diarrhea.  Patient has history of chronic diarrhea for several years usually gets 1-3 stools per day.  Over the past few days he has been intermittently hypotensive.  Home health RN noted that he was hypotensive with SBP 60s at home and called EMS. In the ED patient's creatinine was found to be 3.3 up from 1.8 at baseline.  Initial potassium was 7.2 which improved to 5.9 with IV fluid bolus.   Subjective   Patient seen and examined, plan for colonoscopy today. Denies any complaints.   Assessment/Plan:     1. Acute kidney injury on CKD stage III-likely prerenal from dehydration from ongoing diarrhea.  Renal ultrasound shows no hydronephrosis, large partially exophytic cyst arising from the lower pole right kidney demonstrates some thin internal septation and questionable mural nodularity.  No definitive evaluation from MR imaging on a nonemergent basis can be done as outpatient.  Creatinine has improved to 1.66. At baseline. Nephrology has signed off. 2. Hypotension-resolved chronically from dehydration.  Antihypertensive medications are currently on hold. 3. Chronic diarrhea/positive FOBT-patient has history of chronic diarrhea 1-3 loose stools daily for past 20 years.  He had one episode of loose BM this morning.  He had colonoscopy 10 years ago, GI was consulted.  Patient agrees to undergo colonoscopy.  Plan for colonoscopy on Sunday.  Plavix is currently on hold.   4. Hyperkalemia-potassium was elevated yesterday at 7.2.  Potassium improved to 5.9 with IV fluids.  Potassium went up to 6.9 yesterday so nephrology was  consulted. Potassium is down to 4.1 Lokelma has been discontinued. Nephrology following.    Continue to hold ACE inhibitor and spironolactone. 5. Diabetes mellitus type 2-CBG well controlled, continue Lantus 20 units subcu daily, NovoLog 8 units 3 times daily meal coverage, resistant sliding scale insulin with NovoLog. CBG well controlled. 6. PAF-continue heparin GTT for anticoagulation, continue Coreg.  Heart rate is well controlled. 7. Hypertension-diuretics currently on hold. 8. CAD s/p CABG-EF 25 to 30%, Plavix is currently on hold.  Coreg is on hold due to hypotension.     COVID-19 Labs  No results for input(s): DDIMER, FERRITIN, LDH, CRP in the last 72 hours.  Lab Results  Component Value Date   SARSCOV2NAA NEGATIVE 05/05/2020   [place] NEGATIVE 11/19/2019     Scheduled medications:   . insulin aspart  0-20 Units Subcutaneous TID WC  . insulin aspart  8 Units Subcutaneous TID WC  . insulin glargine  20 Units Subcutaneous QHS         CBG: Recent Labs  Lab 05/08/20 1633 05/08/20 2110 05/09/20 0620 05/09/20 1113 05/09/20 1238  GLUCAP 88 150* 161* 181* 170*    SpO2: 100 % O2 Flow Rate (L/min): 2 L/min    CBC: Recent Labs  Lab 05/05/20 1349 05/05/20 1349 05/05/20 2231 05/06/20 0207 05/07/20 1347 05/08/20 0120 05/09/20 0709  WBC 9.3  --   --  9.1 6.2 5.5 5.1  NEUTROABS 6.7  --   --   --   --   --   --   HGB 12.1*   < > 10.9* 11.3* 11.1* 10.4* 10.3*  HCT 35.7*   < >  32.0* 34.4* 33.9* 30.1* 30.6*  MCV 99.2  --   --  99.7 100.0 98.7 97.8  PLT PLATELET CLUMPS NOTED ON SMEAR, UNABLE TO ESTIMATE  --   --  100* 79* 68* 60*   < > = values in this interval not displayed.    Basic Metabolic Panel: Recent Labs  Lab 05/06/20 1857 05/06/20 2035 05/07/20 1347 05/08/20 1131 05/09/20 0709  NA 135 136 139 140 141  K 5.8* 6.0* 5.6* 4.5 4.1  CL 110 110 111 108 106  CO2 16* 16* 18* 21* 25  GLUCOSE 177* 209* 211* 195* 171*  BUN 78* 75* 61* 42* 30*   CREATININE 2.47* 2.50* 2.09* 1.83* 1.66*  CALCIUM 9.0 8.5* 9.1 8.8* 8.7*     Liver Function Tests: Recent Labs  Lab 05/05/20 1349  AST 31  ALT 31  ALKPHOS 57  BILITOT 1.1  PROT 6.8  ALBUMIN 4.0     Antibiotics: Anti-infectives (From admission, onward)   None       DVT prophylaxis: Heparin  Code Status: DNR  Family Communication: No family at bedside    Status is: Inpatient  Dispo: The patient is from: Home              Anticipated d/c is to: Home              Anticipated d/c date is: 05/10/2020              Patient currently not medically stable for discharge  Barrier to discharge-PT evaluation in a.m.      Consultants:  Gastroenterology  Procedures:     Objective   Vitals:   05/09/20 0822 05/09/20 0946 05/09/20 1110 05/09/20 1125  BP: 139/70 (!) 140/59 (!) 75/47 (!) 144/55  Pulse: 88 83 73 75  Resp: 20 20 (!) 24 20  Temp: 99 F (37.2 C) 98.4 F (36.9 C) 98.2 F (36.8 C) 98.2 F (36.8 C)  TempSrc: Oral Oral  Oral  SpO2: 100% 100% 100% 100%  Weight:      Height:        Intake/Output Summary (Last 24 hours) at 05/09/2020 1455 Last data filed at 05/09/2020 1300 Gross per 24 hour  Intake 300 ml  Output 300 ml  Net 0 ml    09/03 1901 - 09/05 0700 In: -  Out: 650 [Urine:650]  Filed Weights   05/05/20 1328  Weight: 131.8 kg    Physical Examination:   General-appears in no acute distress Heart-S1-S2, regular, no murmur auscultated Lungs-clear to auscultation bilaterally, no wheezing or crackles auscultated Abdomen-soft, nontender, no organomegaly Extremities-no edema in the lower extremities Neuro-alert, oriented x3, no focal deficit noted   Data Reviewed:   Recent Results (from the past 240 hour(s))  Blood culture (routine x 2)     Status: None (Preliminary result)   Collection Time: 05/05/20  7:20 PM   Specimen: BLOOD  Result Value Ref Range Status   Specimen Description BLOOD LEFT ANTECUBITAL  Final   Special Requests    Final    BOTTLES DRAWN AEROBIC AND ANAEROBIC Blood Culture results may not be optimal due to an excessive volume of blood received in culture bottles   Culture   Final    NO GROWTH 4 DAYS Performed at [facility], [place] [address], [place], [place] [address]    Report Status PENDING  Incomplete  Blood culture (routine x 2)     Status: None (Preliminary result)   Collection Time: 05/05/20  7:28 PM   Specimen: BLOOD RIGHT FOREARM  Result Value Ref Range Status   Specimen Description BLOOD RIGHT FOREARM  Final   Special Requests   Final    BOTTLES DRAWN AEROBIC AND ANAEROBIC Blood Culture adequate volume   Culture   Final    NO GROWTH 4 DAYS Performed at [facility], [address], [place], [place] [address]    Report Status PENDING  Incomplete  SARS Coronavirus 2 by RT PCR (hospital order, performed in [facility] hospital lab) Nasopharyngeal Nasopharyngeal Swab     Status: None   Collection Time: 05/05/20  7:32 PM   Specimen: Nasopharyngeal Swab  Result Value Ref Range Status   SARS Coronavirus 2 NEGATIVE NEGATIVE Final    Comment: (NOTE) SARS-CoV-2 target nucleic acids are NOT DETECTED.  The SARS-CoV-2 RNA is generally detectable in upper and lower respiratory specimens during the acute phase of infection. The lowest concentration of SARS-CoV-2 viral copies this assay can detect is 250 copies / mL. A negative result does not preclude SARS-CoV-2 infection and should not be used as the sole basis for treatment or other patient management decisions.  A negative result may occur with improper specimen collection / handling, submission of specimen other than nasopharyngeal swab, presence of viral mutation(s) within the areas targeted by this assay, and inadequate number of viral copies (<250 copies / mL). A negative result must be combined with clinical observations, patient history, and epidemiological information.  Fact Sheet for Patients:    StrictlyIdeas.no  Fact Sheet for Healthcare Providers: BankingDealers.co.za  This test is not yet approved or  cleared by the [place] FDA and has been authorized for detection and/or diagnosis of SARS-CoV-2 by FDA under an Emergency Use Authorization (EUA).  This EUA will remain in effect (meaning this test can be used) for the duration of the COVID-19 declaration under Section 564(b)(1) of the Act, 21 U.S.C. section 360bbb-3(b)(1), unless the authorization is terminated or revoked sooner.  Performed at [facility], [place] [address], [place], [place] [address]   Gastrointestinal Panel by PCR , Stool     Status: None   Collection Time: 05/06/20 12:20 AM   Specimen: Stool  Result Value Ref Range Status   Campylobacter species NOT DETECTED NOT DETECTED Final   Plesimonas shigelloides NOT DETECTED NOT DETECTED Final   Salmonella species NOT DETECTED NOT DETECTED Final   Yersinia enterocolitica NOT DETECTED NOT DETECTED Final   Vibrio species NOT DETECTED NOT DETECTED Final   Vibrio cholerae NOT DETECTED NOT DETECTED Final   Enteroaggregative E coli (EAEC) NOT DETECTED NOT DETECTED Final   Enteropathogenic E coli (EPEC) NOT DETECTED NOT DETECTED Final   Enterotoxigenic E coli (ETEC) NOT DETECTED NOT DETECTED Final   Shiga like toxin producing E coli (STEC) NOT DETECTED NOT DETECTED Final   Shigella/Enteroinvasive E coli (EIEC) NOT DETECTED NOT DETECTED Final   Cryptosporidium NOT DETECTED NOT DETECTED Final   Cyclospora cayetanensis NOT DETECTED NOT DETECTED Final   Entamoeba histolytica NOT DETECTED NOT DETECTED Final   Giardia lamblia NOT DETECTED NOT DETECTED Final   Adenovirus F40/41 NOT DETECTED NOT DETECTED Final   Astrovirus NOT DETECTED NOT DETECTED Final   Norovirus GI/GII NOT DETECTED NOT DETECTED Final   Rotavirus A NOT DETECTED NOT DETECTED Final   Sapovirus (I, II, IV, and V) NOT DETECTED NOT DETECTED Final     Comment: Performed at [facility], [address], [place],  [address]    No  results for input(s): LIPASE, AMYLASE in the last 168 hours. No results for input(s): AMMONIA in the last 168 hours.  Cardiac Enzymes: Recent Labs  Lab 05/06/20 1857  CKTOTAL 91       [facility] If 7PM-7AM, please contact night-coverage at [email or web address], Office  [phone]   05/09/2020, 2:55 PM  LOS: 4 days

## 2020-05-09 NOTE — Progress Notes (Signed)
[facility] ROUNDING NOTE   Subjective:   Brief history: 74 year old gentleman with a history of stroke, CAD status post CABG congestive heart failure with systolic dysfunction ejection fraction 25 to 30% status post AICD diabetes mellitus type 2 hypertension paroxysmal atrial fibrillation anticoagulation.  Admitted to the hospital 05/06/2020 with diarrhea that has been chronic and hypotension.  Baseline serum creatinine 1.8 mg/dL.  Found to be hyperkalemic with a potassium of 7.2.  His home medications included lisinopril 20 mg daily as well as spironolactone 50 mg daily and torsemide 40 mg daily.  Blood pressure 154/97 pulse 93 temperature 98.2 O2 sats 96%.  Urine output 250 cc recorded on 05/08/2026  Sodium 140 potassium 4.5 chloride 108 CO2 21 BUN 42 creatinine 1.8 glucose 195 calcium 8.8 hemoglobin 10.4  Insulin sliding scale, sodium bicarbonate 650 mg 3 times daily, Lokelma 10 mg 3 times daily  IV heparin   Objective:  Vital signs in last 24 hours:  Temp:  [98 F (36.7 C)-98.4 F (36.9 C)] 98.2 F (36.8 C) (09/05 0417) Pulse Rate:  [63-90] 90 (09/05 0417) Resp:  [13-20] 19 (09/05 0417) BP: (133-154)/(58-97) 154/97 (09/05 0417) SpO2:  [96 %-100 %] 96 % (09/05 0417)  Weight change:  Filed Weights   05/05/20 1328  Weight: 131.8 kg    Intake/Output: I/O last 3 completed shifts: In: -  Out: 650 [Urine:650]   Intake/Output this shift:  No intake/output data recorded.  General: tired-appearing, no acute distress HEENT: anicteric sclera, oropharynx clear without lesions CV: regular rate, normal rhythm, no murmurs, no gallops, no rubs, AICD site left chest wall c/d/i Lungs: clear to auscultation bilaterally, normal work of breathing, bilateral chest expansion, no w/r/r/c Abd: obese, soft, non-tender, non-distended Skin: no visible lesions or rashes Psych: alert, engaged, appropriate mood and affect Musculoskeletal: no edema, no obvious deformities Neuro:  normal speech, no gross focal deficits    Basic Metabolic Panel: Recent Labs  Lab 05/06/20 1125 05/06/20 1125 05/06/20 1857 05/06/20 1857 05/06/20 2035 05/07/20 1347 05/08/20 1131  NA 136  --  135  --  136 139 140  K 6.9*  --  5.8*  --  6.0* 5.6* 4.5  CL 110  --  110  --  110 111 108  CO2 13*  --  16*  --  16* 18* 21*  GLUCOSE 160*  --  177*  --  209* 211* 195*  BUN 86*  --  78*  --  75* 61* 42*  CREATININE 2.55*  --  2.47*  --  2.50* 2.09* 1.83*  CALCIUM 8.9   < > 9.0   < > 8.5* 9.1 8.8*   < > = values in this interval not displayed.    Liver Function Tests: Recent Labs  Lab 05/05/20 1349  AST 31  ALT 31  ALKPHOS 57  BILITOT 1.1  PROT 6.8  ALBUMIN 4.0   No results for input(s): LIPASE, AMYLASE in the last 168 hours. No results for input(s): AMMONIA in the last 168 hours.  CBC: Recent Labs  Lab 05/05/20 1349 05/05/20 2231 05/06/20 0207 05/07/20 1347 05/08/20 0120  WBC 9.3  --  9.1 6.2 5.5  NEUTROABS 6.7  --   --   --   --   HGB 12.1* 10.9* 11.3* 11.1* 10.4*  HCT 35.7* 32.0* 34.4* 33.9* 30.1*  MCV 99.2  --  99.7 100.0 98.7  PLT PLATELET CLUMPS NOTED ON SMEAR, UNABLE TO ESTIMATE  --  100* 79* 68*    Cardiac  Enzymes: Recent Labs  Lab 05/06/20 1857  CKTOTAL 91    BNP: Invalid input(s): POCBNP  CBG: Recent Labs  Lab 05/08/20 0536 05/08/20 1055 05/08/20 1633 05/08/20 2110 05/09/20 0620  GLUCAP 133* 240* 88 150* 161*    Microbiology: Results for orders placed or performed during the hospital encounter of 05/05/20  Blood culture (routine x 2)     Status: None (Preliminary result)   Collection Time: 05/05/20  7:20 PM   Specimen: BLOOD  Result Value Ref Range Status   Specimen Description BLOOD LEFT ANTECUBITAL  Final   Special Requests   Final    BOTTLES DRAWN AEROBIC AND ANAEROBIC Blood Culture results may not be optimal due to an excessive volume of blood received in culture bottles   Culture   Final    NO GROWTH 3 DAYS Performed at [facility], [place] [address] [place], [place] [address]    Report Status PENDING  Incomplete  Blood culture (routine x 2)     Status: None (Preliminary result)   Collection Time: 05/05/20  7:28 PM   Specimen: BLOOD RIGHT FOREARM  Result Value Ref Range Status   Specimen Description BLOOD RIGHT FOREARM  Final   Special Requests   Final    BOTTLES DRAWN AEROBIC AND ANAEROBIC Blood Culture adequate volume   Culture   Final    NO GROWTH 3 DAYS Performed at [facility], [place] [address] [place], [place] [address]    Report Status PENDING  Incomplete  SARS Coronavirus 2 by RT PCR (hospital order, performed in [facility] hospital lab) Nasopharyngeal Nasopharyngeal Swab     Status: None   Collection Time: 05/05/20  7:32 PM   Specimen: Nasopharyngeal Swab  Result Value Ref Range Status   SARS Coronavirus 2 NEGATIVE NEGATIVE Final    Comment: (NOTE) SARS-CoV-2 target nucleic acids are NOT DETECTED.  The SARS-CoV-2 RNA is generally detectable in upper and lower respiratory specimens during the acute phase of infection. The lowest concentration of SARS-CoV-2 viral copies this assay can detect is 250 copies / mL. A negative result does not preclude SARS-CoV-2 infection and should not be used as the sole basis for treatment or other patient management decisions.  A negative result may occur with improper specimen collection / handling, submission of specimen other than nasopharyngeal swab, presence of viral mutation(s) within the areas targeted by this assay, and inadequate number of viral copies (<250 copies / mL). A negative result must be combined with clinical observations, patient history, and epidemiological information.  Fact Sheet for Patients:   StrictlyIdeas.no  Fact Sheet for Healthcare Providers: BankingDealers.co.za  This test is not yet approved or  cleared by the [place] FDA and has been authorized for detection  and/or diagnosis of SARS-CoV-2 by FDA under an Emergency Use Authorization (EUA).  This EUA will remain in effect (meaning this test can be used) for the duration of the COVID-19 declaration under Section 564(b)(1) of the Act, 21 U.S.C. section 360bbb-3(b)(1), unless the authorization is terminated or revoked sooner.  Performed at [facility], [place] [address] [place], [place] [address]   Gastrointestinal Panel by PCR , Stool     Status: None   Collection Time: 05/06/20 12:20 AM   Specimen: Stool  Result Value Ref Range Status   Campylobacter species NOT DETECTED NOT DETECTED Final   Plesimonas shigelloides NOT DETECTED NOT DETECTED Final   Salmonella species NOT DETECTED NOT DETECTED Final   Yersinia enterocolitica  NOT DETECTED NOT DETECTED Final   Vibrio species NOT DETECTED NOT DETECTED Final   Vibrio cholerae NOT DETECTED NOT DETECTED Final   Enteroaggregative E coli (EAEC) NOT DETECTED NOT DETECTED Final   Enteropathogenic E coli (EPEC) NOT DETECTED NOT DETECTED Final   Enterotoxigenic E coli (ETEC) NOT DETECTED NOT DETECTED Final   Shiga like toxin producing E coli (STEC) NOT DETECTED NOT DETECTED Final   Shigella/Enteroinvasive E coli (EIEC) NOT DETECTED NOT DETECTED Final   Cryptosporidium NOT DETECTED NOT DETECTED Final   Cyclospora cayetanensis NOT DETECTED NOT DETECTED Final   Entamoeba histolytica NOT DETECTED NOT DETECTED Final   Giardia lamblia NOT DETECTED NOT DETECTED Final   Adenovirus F40/41 NOT DETECTED NOT DETECTED Final   Astrovirus NOT DETECTED NOT DETECTED Final   Norovirus GI/GII NOT DETECTED NOT DETECTED Final   Rotavirus A NOT DETECTED NOT DETECTED Final   Sapovirus (I, II, IV, and V) NOT DETECTED NOT DETECTED Final    Comment: Performed at [facility], [address] [place] [place], [place] [address]    Coagulation Studies: No results for input(s): LABPROT, INR in the last 72 hours.  Urinalysis: No results for input(s): COLORURINE,  LABSPEC, PHURINE, GLUCOSEU, HGBUR, BILIRUBINUR, KETONESUR, PROTEINUR, UROBILINOGEN, NITRITE, LEUKOCYTESUR in the last 72 hours.  Invalid input(s): APPERANCEUR    Imaging: No results found.   Medications:   . sodium chloride Stopped (05/07/20 1245)  .  sodium bicarbonate (isotonic) infusion in sterile water 100 mL/hr at 05/08/20 1241   . insulin aspart  0-20 Units Subcutaneous TID WC  . insulin aspart  8 Units Subcutaneous TID WC  . insulin glargine  20 Units Subcutaneous QHS   acetaminophen **OR** acetaminophen, ondansetron **OR** ondansetron (ZOFRAN) IV  Assessment/ Plan:  AKI on CKD3 (improved): Appears to be improving.  We will continue IV fluids and holding ACE inhibitor as well as potassium sparing diuretic.  Renal function back to baseline.  Hyperkalemia Agree with avoid ACE inhibitor and potassium sparing diuretic.  Resolved will discontinue IV sodium bicarbonate.  Will discontinue Lokelma at this point.  Metabolic acidosis, non-anion gap -Continue IV sodium bicarbonate 100 cc/h.  This is resolved will discontinue IV sodium bicarbonate  Renal cyst -lower pole right kidney has a partially exophytic cyst with thin internal septation and questionable mural nodularity. Will need further evaluation with a MRI with and without contrast (2nd or 3rd contrast agent) which can be done as an outpatient  Hypertension: -We will continue to follow continue to would avoid ACE/ARB  and potassium sparing diuretics.  Anemia due to chronic kidney disease: Not an issue at this time   Diarrhea -GI on board, patient refuses c-scope  Uncontrolled Diabetes Mellitus Type 2 with Hyperglycemia -mgmt per primary service  HFrEF s/p AICD, EF 25-30%  Will sign off patient please call back if further assistance needed.  Patient's renal function appears to be back at baseline.  He will need follow-up with urology regarding abnormality on right kidney.  Follow-up with [facility].  4 to 6 weeks.    LOS: [place] @TODAY @7 :14 AM

## 2020-05-09 NOTE — Transfer of Care (Signed)
Immediate Anesthesia Transfer of Care Note  Patient: Vernon Briggs  Procedure(s) Performed: COLONOSCOPY WITH PROPOFOL (N/A ) POLYPECTOMY BIOPSY  Patient Location: PACU  Anesthesia Type:MAC  Level of Consciousness: awake, alert  and oriented  Airway & Oxygen Therapy: Patient Spontanous Breathing and Patient connected to nasal cannula oxygen  Post-op Assessment: Report given to RN and Post -op Vital signs reviewed and stable  Post vital signs: Reviewed and stable  Last Vitals:  Vitals Value Taken Time  BP    Temp    Pulse    Resp    SpO2      Last Pain:  Vitals:   05/09/20 1110  TempSrc:   PainSc: (P) 0-No pain         Complications: No complications documented.

## 2020-05-10 LAB — GLUCOSE, CAPILLARY
Glucose-Capillary: 204 mg/dL — ABNORMAL HIGH (ref 70–99)
Glucose-Capillary: 216 mg/dL — ABNORMAL HIGH (ref 70–99)

## 2020-05-10 LAB — CBC
HCT: 31.2 % — ABNORMAL LOW (ref 39.0–52.0)
Hemoglobin: 10.5 g/dL — ABNORMAL LOW (ref 13.0–17.0)
MCH: 33.9 pg (ref 26.0–34.0)
MCHC: 33.7 g/dL (ref 30.0–36.0)
MCV: 100.6 fL — ABNORMAL HIGH (ref 80.0–100.0)
Platelets: 73 10*3/uL — ABNORMAL LOW (ref 150–400)
RBC: 3.1 MIL/uL — ABNORMAL LOW (ref 4.22–5.81)
RDW: 14.5 % (ref 11.5–15.5)
WBC: 6.5 10*3/uL (ref 4.0–10.5)
nRBC: 0 % (ref 0.0–0.2)

## 2020-05-10 LAB — CULTURE, BLOOD (ROUTINE X 2)
Culture: NO GROWTH
Culture: NO GROWTH
Special Requests: ADEQUATE

## 2020-05-10 MED ORDER — CHOLESTYRAMINE 4 G PO PACK
4.0000 g | PACK | Freq: Every day | ORAL | Status: DC
  Administered 2020-05-10: 4 g via ORAL
  Filled 2020-05-10: qty 1

## 2020-05-10 MED ORDER — TORSEMIDE 20 MG PO TABS
20.0000 mg | ORAL_TABLET | Freq: Every day | ORAL | 3 refills | Status: DC
Start: 2020-05-10 — End: 2020-07-03

## 2020-05-10 MED ORDER — APIXABAN 5 MG PO TABS
5.0000 mg | ORAL_TABLET | Freq: Two times a day (BID) | ORAL | Status: DC
  Filled 2020-05-10: qty 1

## 2020-05-10 MED ORDER — APIXABAN 5 MG PO TABS: 5.0000 mg | ORAL_TABLET | Freq: Two times a day (BID) | ORAL | Status: AC

## 2020-05-10 MED ORDER — CLOPIDOGREL BISULFATE 75 MG PO TABS: 75.0000 mg | ORAL_TABLET | Freq: Every day | ORAL | Status: AC

## 2020-05-10 MED ORDER — CHOLESTYRAMINE 4 G PO PACK: 4.0000 g | PACK | Freq: Every day | ORAL | 12 refills | Status: AC

## 2020-05-10 NOTE — Discharge Summary (Addendum)
Physician Discharge Summary  Vernon Briggs IWO:[id number] DOB: 18-Feb-1946 DOA: 05/05/2020  PCP: [name], MD  Admit date: 05/05/2020 Discharge date: 05/10/2020  Time spent: 50 minutes  Recommendations for Outpatient Follow-up:  1. Follow-up gastroenterology in 2 weeks follow-up on biopsy results 2. Check BMP in 1 week at PCP office 3. Renal cyst on ultrasound, will need MRI of abdomen as outpatient to assess renal cyst.  Discharge Diagnoses:  Principal Problem:   AKI (acute kidney injury) ([facility]) Active Problems: Hyperkalemia   History of CVA (cerebrovascular accident)   Type II diabetes mellitus with complication ([facility])   Essential hypertension   Cardiomyopathy, ischemic   PAF (paroxysmal atrial fibrillation) ([facility])   Hyperkalemia   CKD (chronic kidney disease) stage 3, GFR 30-59 ml/min   Diarrhea   Volume depletion   Palliative care by specialist   Goals of care, counseling/discussion   Advanced directives, counseling/discussion   Generalized weakness   Benign neoplasm of ascending colon   Benign neoplasm of transverse colon   Benign neoplasm of descending colon   Benign neoplasm of rectum   Discharge Condition: Stable  Diet recommendation: Heart healthy diet  Filed Weights   05/05/20 1328  Weight: 131.8 kg    History of present illness:  74 year old male with medical history of stroke, CAD s/p CABG, HFr EF, 25 to 30%, status post AICD, CKD stage III, diabetes mellitus type 2, hypertension, paroxysmal atrial fibrillation on Eliquis was brought to hospital for hypotension and diarrhea.  Patient has history of chronic diarrhea for several years usually gets 1-3 stools per day.  Over the past few days he has been intermittently hypotensive.  Home health RN noted that he was hypotensive with SBP 60s at home and called EMS. In the ED patient's creatinine was found to be 3.3 up from 1.8 at baseline.  Initial potassium was 7.2 which improved to 5.9 with IV fluid  bolus.  Hospital Course:  1. Acute kidney injury on CKD stage III-likely prerenal from dehydration from ongoing diarrhea.  Renal ultrasound shows no hydronephrosis, large partially exophytic cyst arising from the lower pole right kidney demonstrates some thin internal septation and questionable mural nodularity.  No definitive evaluation from MR imaging on a nonemergent basis can be done as outpatient.  Creatinine has improved to 1.66. At baseline. Nephrology has signed off.  Recommend to hold Aldactone and lisinopril. 2. Hypotension-resolved chronically from dehydration.  Antihypertensive medications Aldactone and lisinopril are currently on hold.  Will not restart these medications. 3. Chronic diarrhea/positive FOBT-patient has history of chronic diarrhea 1-3 loose stools daily for past 20 years.  He had one episode of loose BM this morning.  He had colonoscopy 10 years ago, GI was consulted.  Patient underwent colonoscopy, polyps were removed.  GI has started patient on cholestyramine 4 g daily.  Also recommend to restart Plavix in 5 days on 05/14/2020 and apixaban in 3 days on 05/12/2020. 4. Hyperkalemia-potassium was elevated to 7.2.  Potassium improved to 5.9 with IV fluids.  Potassium went up to 6.9 yesterday so nephrology was consulted. Potassium is down to 4.1 Lokelma has been discontinued.   Nephrology recommends to discontinue ACE inhibitor and spironolactone. 5. Diabetes mellitus type 2-CBG well controlled, continue Lantus 20 units subcu daily, NovoLog 8 units 3 times daily meal coverage, resistant sliding scale insulin with NovoLog. CBG well controlled. 6. PAF-continue heparin GTT for anticoagulation, continue Coreg.  Heart rate is well controlled. 7. CAD s/p CABG-EF 25 to 30%, Plavix is currently on hold.  Restart Coreg.  Will cut down the dose of Demadex to 20 mg p.o. daily.  Renal cyst -lower pole right kidney has a partially exophytic cyst with thin internal septation and questionable  mural nodularity. Will need further evaluation with a MRI with and without contrast (2nd or 3rd contrast agent) which can be done as an outpatient  Procedures:  Colonoscopy  Consultations:  Gastroenterology  Discharge Exam: Vitals:   05/10/20 0812 05/10/20 1109  BP: 116/66 136/60  Pulse: 91 73  Resp: 20 17  Temp: 97.7 F (36.5 C) 98 F (36.7 C)  SpO2: 98% 97%    General: Appears in no acute distress Cardiovascular: S1-S2, regular, no murmur auscultated Respiratory: Clear to auscultation bilaterally  Discharge Instructions   Discharge Instructions    Diet - low sodium heart healthy   Complete by: As directed    Discharge instructions   Complete by: As directed    Start taking Plavix on 05/14/2020 Start taking Eliquis on 05/12/2020   Increase activity slowly   Complete by: As directed      Allergies as of 05/10/2020      Reactions   Tape Other (See Comments)   Causes skin irritation      Medication List    STOP taking these medications   lisinopril 20 MG tablet Commonly known as: ZESTRIL   spironolactone 25 MG tablet Commonly known as: ALDACTONE     TAKE these medications   acetaminophen 325 MG tablet Commonly known as: TYLENOL Take 2 tablets (650 mg total) by mouth every 4 (four) hours as needed for headache or mild pain. What changed: how much to take   apixaban 5 MG Tabs tablet Commonly known as: Eliquis Take 1 tablet (5 mg total) by mouth 2 (two) times daily. Start taking on 05/12/20 Start taking on: May 12, 2020 What changed:   additional instructions  These instructions start on May 12, 2020. If you are unsure what to do until then, ask your doctor or other care provider.   carvedilol 3.125 MG tablet Commonly known as: COREG Take 1 tablet (3.125 mg total) by mouth 2 (two) times daily with a meal.   cholestyramine 4 g packet Commonly known as: QUESTRAN Take 1 packet (4 g total) by mouth daily at 2 PM.   clopidogrel 75 MG  tablet Commonly known as: PLAVIX Take 1 tablet (75 mg total) by mouth daily. Start taking on 05/14/20 Start taking on: May 14, 2020 What changed:   additional instructions  These instructions start on May 14, 2020. If you are unsure what to do until then, ask your doctor or other care provider.   FLUoxetine 20 MG capsule Commonly known as: PROZAC Take 40 mg by mouth at bedtime.   HumuLIN R U-500 KwikPen 500 UNIT/ML kwikpen Generic drug: insulin regular human CONCENTRATED Inject 55-110 Units into the skin See admin instructions. Inject 110 units into the skin in the morning before breakfast and 55 units at bedtime   hydroxypropyl methylcellulose / hypromellose 2.5 % ophthalmic solution Commonly known as: ISOPTO TEARS / GONIOVISC Place 1 drop into both eyes 2 (two) times daily as needed for dry eyes.   isosorbide mononitrate 30 MG 24 hr tablet Commonly known as: IMDUR Take 1 tablet (30 mg total) by mouth daily.   ketoconazole 2 % cream Commonly known as: NIZORAL Apply 1 application topically daily as needed for irritation.   loperamide 2 MG tablet Commonly known as: IMODIUM A-D Take 2 mg by mouth daily as  needed for diarrhea or loose stools.   nitroGLYCERIN 0.4 MG SL tablet Commonly known as: NITROSTAT Place 1 tablet (0.4 mg total) under the tongue every 5 (five) minutes x 3 doses as needed for chest pain.   rosuvastatin 20 MG tablet Commonly known as: CRESTOR Take 1 tablet (20 mg total) by mouth at bedtime.   torsemide 20 MG tablet Commonly known as: DEMADEX Take 1 tablet (20 mg total) by mouth daily. What changed:   how much to take  when to take this   Vitamin D-3 25 MCG (1000 UT) Caps Take 1,000 Units by mouth daily.      Allergies  Allergen Reactions  . Tape Other (See Comments)    Causes skin irritation    Follow-up Information    Ladene Artist, MD Follow up in 2 week(s).   Specialty: Gastroenterology Why: Follow up biopsy  results Contact information: [address] [address] [phone]                The results of significant diagnostics from this hospitalization (including imaging, microbiology, ancillary and laboratory) are listed below for reference.    Significant Diagnostic Studies: US RENAL  Result Date: 05/06/2020 CLINICAL DATA:  Acute kidney injury EXAM: RENAL / URINARY TRACT ULTRASOUND COMPLETE COMPARISON:  None. FINDINGS: Right Kidney: Renal measurements: 11.8 x 6.3 x 4.9 cm = volume: 189 mL. 3.9 x 2.3 x 1.7 cm anechoic parapelvic cyst in the interpolar right kidney. Additional partially exophytic 4.4 x 3.0 x 3.7 cm cyst with some internal septation and a questionable mural nodule (image 46/112). Echogenicity is within normal limits. No other concerning renal mass, shadowing calculus or hydronephrosis. Left Kidney: Renal measurements: 12.4 x 5.9 x 5.0 cm = volume: 193 mL. 2.4 x 1.5 x 2.0 cm anechoic parapelvic cyst in the interpolar left kidney. Echogenicity is within normal limits. No concerning renal mass, shadowing calculus or hydronephrosis. Bladder: Prevoid volume of 284 mL. Bladder appears normal for the degree of distention though patient states they feel unable to void at the time of examination. Other: None. IMPRESSION: Bilateral renal cysts. A larger partially exophytic cyst arising from the lower pole right kidney demonstrates some thin internal septation and questionable mural nodularity. More definitive evaluation could be made with MR imaging on a nonemergent basis especially if patient would better tolerate contrast media following resolution of the acute kidney injury. Significant prevoid volume with inability to void further. Could correlate for symptoms of outlet obstruction. Otherwise unremarkable ultrasound of the urinary tract. Electronically Signed   By: Lovena Le M.D.   On: 05/06/2020 00:09   DG Chest Port 1 View  Result Date: 05/05/2020 CLINICAL DATA:   Hypotension EXAM: PORTABLE CHEST 1 VIEW COMPARISON:  11/19/2019 FINDINGS: Post sternotomy changes. Left-sided pacing device as before. No focal opacity or pleural effusion. Stable enlarged cardiomediastinal silhouette. No pneumothorax. IMPRESSION: No active disease. Cardiomegaly. Electronically Signed   By: Donavan Foil M.D.   On: 05/05/2020 19:56    Microbiology: Recent Results (from the past 240 hour(s))  Blood culture (routine x 2)     Status: None   Collection Time: 05/05/20  7:20 PM   Specimen: BLOOD  Result Value Ref Range Status   Specimen Description BLOOD LEFT ANTECUBITAL  Final   Special Requests   Final    BOTTLES DRAWN AEROBIC AND ANAEROBIC Blood Culture results Briggs not be optimal due to an excessive volume of blood received in culture bottles   Culture  Final    NO GROWTH 5 DAYS Performed at [facility], [place] [address] [place], [place] [address]    Report Status 05/10/2020 FINAL  Final  Blood culture (routine x 2)     Status: None   Collection Time: 05/05/20  7:28 PM   Specimen: BLOOD RIGHT FOREARM  Result Value Ref Range Status   Specimen Description BLOOD RIGHT FOREARM  Final   Special Requests   Final    BOTTLES DRAWN AEROBIC AND ANAEROBIC Blood Culture adequate volume   Culture   Final    NO GROWTH 5 DAYS Performed at [facility], [place] [address] [place], [place] [address]    Report Status 05/10/2020 FINAL  Final  SARS Coronavirus 2 by RT PCR (hospital order, performed in [facility] hospital lab) Nasopharyngeal Nasopharyngeal Swab     Status: None   Collection Time: 05/05/20  7:32 PM   Specimen: Nasopharyngeal Swab  Result Value Ref Range Status   SARS Coronavirus 2 NEGATIVE NEGATIVE Final    Comment: (NOTE) SARS-CoV-2 target nucleic acids are NOT DETECTED.  The SARS-CoV-2 RNA is generally detectable in upper and lower respiratory specimens during the acute phase of infection. The lowest concentration of SARS-CoV-2 viral copies this assay can  detect is 250 copies / mL. A negative result does not preclude SARS-CoV-2 infection and should not be used as the sole basis for treatment or other patient management decisions.  A negative result Briggs occur with improper specimen collection / handling, submission of specimen other than nasopharyngeal swab, presence of viral mutation(s) within the areas targeted by this assay, and inadequate number of viral copies (<250 copies / mL). A negative result must be combined with clinical observations, patient history, and epidemiological information.  Fact Sheet for Patients:   StrictlyIdeas.no  Fact Sheet for Healthcare Providers: BankingDealers.co.za  This test is not yet approved or  cleared by the [place] FDA and has been authorized for detection and/or diagnosis of SARS-CoV-2 by FDA under an Emergency Use Authorization (EUA).  This EUA will remain in effect (meaning this test can be used) for the duration of the COVID-19 declaration under Section 564(b)(1) of the Act, 21 U.S.C. section 360bbb-3(b)(1), unless the authorization is terminated or revoked sooner.  Performed at [facility], [place] [address], [place], [place] [address]   Gastrointestinal Panel by PCR , Stool     Status: None   Collection Time: 05/06/20 12:20 AM   Specimen: Stool  Result Value Ref Range Status   Campylobacter species NOT DETECTED NOT DETECTED Final   Plesimonas shigelloides NOT DETECTED NOT DETECTED Final   Salmonella species NOT DETECTED NOT DETECTED Final   Yersinia enterocolitica NOT DETECTED NOT DETECTED Final   Vibrio species NOT DETECTED NOT DETECTED Final   Vibrio cholerae NOT DETECTED NOT DETECTED Final   Enteroaggregative E coli (EAEC) NOT DETECTED NOT DETECTED Final   Enteropathogenic E coli (EPEC) NOT DETECTED NOT DETECTED Final   Enterotoxigenic E coli (ETEC) NOT DETECTED NOT DETECTED Final   Shiga like toxin producing E coli (STEC)  NOT DETECTED NOT DETECTED Final   Shigella/Enteroinvasive E coli (EIEC) NOT DETECTED NOT DETECTED Final   Cryptosporidium NOT DETECTED NOT DETECTED Final   Cyclospora cayetanensis NOT DETECTED NOT DETECTED Final   Entamoeba histolytica NOT DETECTED NOT DETECTED Final   Giardia lamblia NOT DETECTED NOT DETECTED Final   Adenovirus F40/41 NOT DETECTED NOT DETECTED Final   Astrovirus NOT DETECTED NOT DETECTED Final  Norovirus GI/GII NOT DETECTED NOT DETECTED Final   Rotavirus A NOT DETECTED NOT DETECTED Final   Sapovirus (I, II, IV, and V) NOT DETECTED NOT DETECTED Final    Comment: Performed at [facility], [place] [place], [place] [address]     Labs: Basic Metabolic Panel: Recent Labs  Lab 05/06/20 1857 05/06/20 2035 05/07/20 1347 05/08/20 1131 05/09/20 0709  NA 135 136 139 140 141  K 5.8* 6.0* 5.6* 4.5 4.1  CL 110 110 111 108 106  CO2 16* 16* 18* 21* 25  GLUCOSE 177* 209* 211* 195* 171*  BUN 78* 75* 61* 42* 30*  CREATININE 2.47* 2.50* 2.09* 1.83* 1.66*  CALCIUM 9.0 8.5* 9.1 8.8* 8.7*   Liver Function Tests: Recent Labs  Lab 05/05/20 1349  AST 31  ALT 31  ALKPHOS 57  BILITOT 1.1  PROT 6.8  ALBUMIN 4.0   No results for input(s): LIPASE, AMYLASE in the last 168 hours. No results for input(s): AMMONIA in the last 168 hours. CBC: Recent Labs  Lab 05/05/20 1349 05/05/20 2231 05/06/20 0207 05/07/20 1347 05/08/20 0120 05/09/20 0709 05/10/20 0253  WBC 9.3  --  9.1 6.2 5.5 5.1 6.5  NEUTROABS 6.7  --   --   --   --   --   --   HGB 12.1*   < > 11.3* 11.1* 10.4* 10.3* 10.5*  HCT 35.7*   < > 34.4* 33.9* 30.1* 30.6* 31.2*  MCV 99.2  --  99.7 100.0 98.7 97.8 100.6*  PLT PLATELET CLUMPS NOTED ON SMEAR, UNABLE TO ESTIMATE  --  100* 79* 68* 60* 73*   < > = values in this interval not displayed.   Cardiac Enzymes: Recent Labs  Lab 05/06/20 1857  CKTOTAL 91    CBG: Recent Labs  Lab 05/09/20 1238 05/09/20 1636 05/09/20 1953 05/10/20 0607  05/10/20 1108  GLUCAP 170* 185* 175* 204* 216*       Signed:  Oswald Hillock MD.  [facility] 05/10/2020, 1:05 PM

## 2020-05-10 NOTE — Progress Notes (Addendum)
Patient ID: Vernon Briggs, male   DOB: 02-16-1946, 74 y.o.   MRN: [id number] Brief GI Note  Ok for discharge today  Have started Questran 4 g daily-take at least 2 hours away from other meds  Colonoscopy yesterday-multiple polyps removed, 6 to 14 mm in size, few left-sided diverticuli, internal hemorrhoids.  Random biopsies obtained and pending  We will follow up on biopsies, and contact patient regarding results. Okay to resume Plavix in 5 days, and Eliquis in 3 days.  Patient to follow-up with regular MD at the [place]. Our office will contact him this week, for follow-up appointment with Dr. Silverio Decamp.

## 2020-05-11 LAB — GLUCOSE, CAPILLARY: Glucose-Capillary: 178 mg/dL — ABNORMAL HIGH (ref 70–99)

## 2020-05-11 NOTE — Anesthesia Postprocedure Evaluation (Signed)
Anesthesia Post Note  Patient: Vernon Briggs  Procedure(s) Performed: COLONOSCOPY WITH PROPOFOL (N/A ) POLYPECTOMY BIOPSY     Patient location during evaluation: Endoscopy Anesthesia Type: MAC Level of consciousness: awake and alert Pain management: pain level controlled Vital Signs Assessment: post-procedure vital signs reviewed and stable Respiratory status: spontaneous breathing, nonlabored ventilation, respiratory function stable and patient connected to nasal cannula oxygen Cardiovascular status: blood pressure returned to baseline and stable Postop Assessment: no apparent nausea or vomiting Anesthetic complications: no   No complications documented.  Last Vitals:  Vitals:   05/10/20 0812 05/10/20 1109  BP: 116/66 136/60  Pulse: 91 73  Resp: 20 17  Temp: 36.5 C 36.7 C  SpO2: 98% 97%    Last Pain:  Vitals:   05/10/20 1109  TempSrc: Oral  PainSc:                  [place]

## 2020-05-12 ENCOUNTER — Encounter: Payer: Self-pay | Admitting: Gastroenterology

## 2020-05-12 LAB — SURGICAL PATHOLOGY

## 2020-05-14 ENCOUNTER — Telehealth: Payer: Self-pay

## 2020-05-14 NOTE — Telephone Encounter (Signed)
Ok, lets try to set up telephone visit for next available appointment in 2-3 months given his symptoms are stable. Thanks

## 2020-05-14 NOTE — Telephone Encounter (Signed)
I called this patient to set up an appointment for follow up. Spoke with his wife because he was asleep.  She is asking for a telephone visit. They do not have the ability for video access. She states it is very hard to get him out of the house. He is weak. He has in home PT. She is stage 4 breast cancer and she is quite weak. She tells me the diarrhea is under control with cholestyramine. The patient is doing well.  Please advise.

## 2020-05-14 NOTE — Telephone Encounter (Signed)
Contacted spouse. Telephone call planned for 07/12/20 at 1:30 pm. She will have up to date VS and weight for the call.

## 2020-06-17 DIAGNOSIS — E1122 Type 2 diabetes mellitus with diabetic chronic kidney disease: Secondary | ICD-10-CM | POA: Diagnosis not present

## 2020-06-17 DIAGNOSIS — E785 Hyperlipidemia, unspecified: Secondary | ICD-10-CM | POA: Diagnosis not present

## 2020-06-17 DIAGNOSIS — N183 Chronic kidney disease, stage 3 unspecified: Secondary | ICD-10-CM | POA: Diagnosis not present

## 2020-06-17 DIAGNOSIS — I130 Hypertensive heart and chronic kidney disease with heart failure and stage 1 through stage 4 chronic kidney disease, or unspecified chronic kidney disease: Secondary | ICD-10-CM | POA: Diagnosis not present

## 2020-06-17 DIAGNOSIS — N179 Acute kidney failure, unspecified: Secondary | ICD-10-CM | POA: Diagnosis not present

## 2020-06-17 DIAGNOSIS — I509 Heart failure, unspecified: Secondary | ICD-10-CM | POA: Diagnosis not present

## 2020-06-19 ENCOUNTER — Emergency Department (HOSPITAL_COMMUNITY): Payer: No Typology Code available for payment source

## 2020-06-19 ENCOUNTER — Encounter (HOSPITAL_COMMUNITY): Payer: Self-pay | Admitting: Emergency Medicine

## 2020-06-19 ENCOUNTER — Other Ambulatory Visit: Payer: Self-pay

## 2020-06-19 ENCOUNTER — Inpatient Hospital Stay (HOSPITAL_COMMUNITY)
Admission: EM | Admit: 2020-06-19 | Discharge: 2020-07-05 | DRG: 291 | Disposition: E | Payer: No Typology Code available for payment source | Attending: Internal Medicine | Admitting: Internal Medicine

## 2020-06-19 DIAGNOSIS — K529 Noninfective gastroenteritis and colitis, unspecified: Secondary | ICD-10-CM | POA: Diagnosis not present

## 2020-06-19 DIAGNOSIS — I130 Hypertensive heart and chronic kidney disease with heart failure and stage 1 through stage 4 chronic kidney disease, or unspecified chronic kidney disease: Secondary | ICD-10-CM | POA: Diagnosis not present

## 2020-06-19 DIAGNOSIS — I5021 Acute systolic (congestive) heart failure: Secondary | ICD-10-CM

## 2020-06-19 DIAGNOSIS — Z7902 Long term (current) use of antithrombotics/antiplatelets: Secondary | ICD-10-CM

## 2020-06-19 DIAGNOSIS — I2510 Atherosclerotic heart disease of native coronary artery without angina pectoris: Secondary | ICD-10-CM

## 2020-06-19 DIAGNOSIS — R410 Disorientation, unspecified: Secondary | ICD-10-CM | POA: Diagnosis not present

## 2020-06-19 DIAGNOSIS — E1169 Type 2 diabetes mellitus with other specified complication: Secondary | ICD-10-CM | POA: Diagnosis not present

## 2020-06-19 DIAGNOSIS — Z955 Presence of coronary angioplasty implant and graft: Secondary | ICD-10-CM

## 2020-06-19 DIAGNOSIS — G9341 Metabolic encephalopathy: Secondary | ICD-10-CM | POA: Diagnosis not present

## 2020-06-19 DIAGNOSIS — R509 Fever, unspecified: Secondary | ICD-10-CM | POA: Diagnosis not present

## 2020-06-19 DIAGNOSIS — I252 Old myocardial infarction: Secondary | ICD-10-CM | POA: Diagnosis not present

## 2020-06-19 DIAGNOSIS — Z95810 Presence of automatic (implantable) cardiac defibrillator: Secondary | ICD-10-CM | POA: Diagnosis not present

## 2020-06-19 DIAGNOSIS — I255 Ischemic cardiomyopathy: Secondary | ICD-10-CM | POA: Diagnosis present

## 2020-06-19 DIAGNOSIS — Z20822 Contact with and (suspected) exposure to covid-19: Secondary | ICD-10-CM | POA: Diagnosis present

## 2020-06-19 DIAGNOSIS — I509 Heart failure, unspecified: Secondary | ICD-10-CM

## 2020-06-19 DIAGNOSIS — F05 Delirium due to known physiological condition: Secondary | ICD-10-CM | POA: Diagnosis not present

## 2020-06-19 DIAGNOSIS — Z9114 Patient's other noncompliance with medication regimen: Secondary | ICD-10-CM

## 2020-06-19 DIAGNOSIS — I10 Essential (primary) hypertension: Secondary | ICD-10-CM | POA: Diagnosis present

## 2020-06-19 DIAGNOSIS — Z66 Do not resuscitate: Secondary | ICD-10-CM | POA: Diagnosis present

## 2020-06-19 DIAGNOSIS — Z91048 Other nonmedicinal substance allergy status: Secondary | ICD-10-CM

## 2020-06-19 DIAGNOSIS — N179 Acute kidney failure, unspecified: Secondary | ICD-10-CM | POA: Diagnosis not present

## 2020-06-19 DIAGNOSIS — I69351 Hemiplegia and hemiparesis following cerebral infarction affecting right dominant side: Secondary | ICD-10-CM

## 2020-06-19 DIAGNOSIS — I5082 Biventricular heart failure: Secondary | ICD-10-CM | POA: Diagnosis not present

## 2020-06-19 DIAGNOSIS — I480 Paroxysmal atrial fibrillation: Secondary | ICD-10-CM | POA: Diagnosis not present

## 2020-06-19 DIAGNOSIS — I2729 Other secondary pulmonary hypertension: Secondary | ICD-10-CM | POA: Diagnosis present

## 2020-06-19 DIAGNOSIS — I700 Atherosclerosis of aorta: Secondary | ICD-10-CM | POA: Diagnosis not present

## 2020-06-19 DIAGNOSIS — N183 Chronic kidney disease, stage 3 unspecified: Secondary | ICD-10-CM | POA: Diagnosis not present

## 2020-06-19 DIAGNOSIS — N2889 Other specified disorders of kidney and ureter: Secondary | ICD-10-CM | POA: Diagnosis not present

## 2020-06-19 DIAGNOSIS — I248 Other forms of acute ischemic heart disease: Secondary | ICD-10-CM | POA: Diagnosis present

## 2020-06-19 DIAGNOSIS — I5023 Acute on chronic systolic (congestive) heart failure: Secondary | ICD-10-CM | POA: Diagnosis present

## 2020-06-19 DIAGNOSIS — E1129 Type 2 diabetes mellitus with other diabetic kidney complication: Secondary | ICD-10-CM | POA: Diagnosis not present

## 2020-06-19 DIAGNOSIS — J90 Pleural effusion, not elsewhere classified: Secondary | ICD-10-CM | POA: Diagnosis not present

## 2020-06-19 DIAGNOSIS — Z79899 Other long term (current) drug therapy: Secondary | ICD-10-CM

## 2020-06-19 DIAGNOSIS — G4733 Obstructive sleep apnea (adult) (pediatric): Secondary | ICD-10-CM | POA: Diagnosis present

## 2020-06-19 DIAGNOSIS — R0681 Apnea, not elsewhere classified: Secondary | ICD-10-CM | POA: Diagnosis not present

## 2020-06-19 DIAGNOSIS — Z515 Encounter for palliative care: Secondary | ICD-10-CM | POA: Diagnosis not present

## 2020-06-19 DIAGNOSIS — N281 Cyst of kidney, acquired: Secondary | ICD-10-CM | POA: Diagnosis not present

## 2020-06-19 DIAGNOSIS — Z9989 Dependence on other enabling machines and devices: Secondary | ICD-10-CM

## 2020-06-19 DIAGNOSIS — Z7901 Long term (current) use of anticoagulants: Secondary | ICD-10-CM

## 2020-06-19 DIAGNOSIS — E1122 Type 2 diabetes mellitus with diabetic chronic kidney disease: Secondary | ICD-10-CM | POA: Diagnosis present

## 2020-06-19 DIAGNOSIS — I809 Phlebitis and thrombophlebitis of unspecified site: Secondary | ICD-10-CM | POA: Diagnosis not present

## 2020-06-19 DIAGNOSIS — E785 Hyperlipidemia, unspecified: Secondary | ICD-10-CM | POA: Diagnosis present

## 2020-06-19 DIAGNOSIS — K219 Gastro-esophageal reflux disease without esophagitis: Secondary | ICD-10-CM | POA: Diagnosis not present

## 2020-06-19 DIAGNOSIS — J9601 Acute respiratory failure with hypoxia: Secondary | ICD-10-CM | POA: Diagnosis present

## 2020-06-19 DIAGNOSIS — F419 Anxiety disorder, unspecified: Secondary | ICD-10-CM | POA: Diagnosis present

## 2020-06-19 DIAGNOSIS — R519 Headache, unspecified: Secondary | ICD-10-CM | POA: Diagnosis not present

## 2020-06-19 DIAGNOSIS — N1832 Chronic kidney disease, stage 3b: Secondary | ICD-10-CM | POA: Diagnosis not present

## 2020-06-19 DIAGNOSIS — E8770 Fluid overload, unspecified: Secondary | ICD-10-CM | POA: Diagnosis not present

## 2020-06-19 DIAGNOSIS — R531 Weakness: Secondary | ICD-10-CM

## 2020-06-19 DIAGNOSIS — E11649 Type 2 diabetes mellitus with hypoglycemia without coma: Secondary | ICD-10-CM | POA: Diagnosis not present

## 2020-06-19 DIAGNOSIS — F4310 Post-traumatic stress disorder, unspecified: Secondary | ICD-10-CM | POA: Diagnosis present

## 2020-06-19 DIAGNOSIS — R0602 Shortness of breath: Secondary | ICD-10-CM | POA: Diagnosis not present

## 2020-06-19 DIAGNOSIS — Z87891 Personal history of nicotine dependence: Secondary | ICD-10-CM

## 2020-06-19 DIAGNOSIS — F32A Depression, unspecified: Secondary | ICD-10-CM | POA: Diagnosis not present

## 2020-06-19 DIAGNOSIS — K449 Diaphragmatic hernia without obstruction or gangrene: Secondary | ICD-10-CM | POA: Diagnosis present

## 2020-06-19 DIAGNOSIS — Z6836 Body mass index (BMI) 36.0-36.9, adult: Secondary | ICD-10-CM

## 2020-06-19 DIAGNOSIS — I517 Cardiomegaly: Secondary | ICD-10-CM | POA: Diagnosis not present

## 2020-06-19 DIAGNOSIS — I110 Hypertensive heart disease with heart failure: Secondary | ICD-10-CM

## 2020-06-19 DIAGNOSIS — E118 Type 2 diabetes mellitus with unspecified complications: Secondary | ICD-10-CM | POA: Diagnosis not present

## 2020-06-19 DIAGNOSIS — I6381 Other cerebral infarction due to occlusion or stenosis of small artery: Secondary | ICD-10-CM | POA: Diagnosis not present

## 2020-06-19 DIAGNOSIS — J9811 Atelectasis: Secondary | ICD-10-CM | POA: Diagnosis not present

## 2020-06-19 DIAGNOSIS — Z951 Presence of aortocoronary bypass graft: Secondary | ICD-10-CM

## 2020-06-19 DIAGNOSIS — I129 Hypertensive chronic kidney disease with stage 1 through stage 4 chronic kidney disease, or unspecified chronic kidney disease: Secondary | ICD-10-CM | POA: Diagnosis not present

## 2020-06-19 DIAGNOSIS — Z794 Long term (current) use of insulin: Secondary | ICD-10-CM

## 2020-06-19 LAB — PROTIME-INR
INR: 1.4 — ABNORMAL HIGH (ref 0.8–1.2)
Prothrombin Time: 16.3 seconds — ABNORMAL HIGH (ref 11.4–15.2)

## 2020-06-19 LAB — CBC
HCT: 40.7 % (ref 39.0–52.0)
Hemoglobin: 13 g/dL (ref 13.0–17.0)
MCH: 32.4 pg (ref 26.0–34.0)
MCHC: 31.9 g/dL (ref 30.0–36.0)
MCV: 101.5 fL — ABNORMAL HIGH (ref 80.0–100.0)
Platelets: 120 10*3/uL — ABNORMAL LOW (ref 150–400)
RBC: 4.01 MIL/uL — ABNORMAL LOW (ref 4.22–5.81)
RDW: 14.3 % (ref 11.5–15.5)
WBC: 8.6 10*3/uL (ref 4.0–10.5)
nRBC: 0 % (ref 0.0–0.2)

## 2020-06-19 LAB — BASIC METABOLIC PANEL
Anion gap: 11 (ref 5–15)
BUN: 36 mg/dL — ABNORMAL HIGH (ref 8–23)
CO2: 23 mmol/L (ref 22–32)
Calcium: 9.1 mg/dL (ref 8.9–10.3)
Chloride: 109 mmol/L (ref 98–111)
Creatinine, Ser: 1.66 mg/dL — ABNORMAL HIGH (ref 0.61–1.24)
GFR, Estimated: 39 mL/min — ABNORMAL LOW (ref >60)
Glucose, Bld: 83 mg/dL (ref 70–99)
Potassium: 3.9 mmol/L (ref 3.5–5.1)
Sodium: 143 mmol/L (ref 135–145)

## 2020-06-19 LAB — URINALYSIS, ROUTINE W REFLEX MICROSCOPIC
Bacteria, UA: NONE SEEN
Bilirubin Urine: NEGATIVE
Glucose, UA: NEGATIVE mg/dL
Hgb urine dipstick: NEGATIVE
Ketones, ur: NEGATIVE mg/dL
Leukocytes,Ua: NEGATIVE
Nitrite: NEGATIVE
Protein, ur: 100 mg/dL — AB
Specific Gravity, Urine: 1.014 (ref 1.005–1.030)
pH: 5 (ref 5.0–8.0)

## 2020-06-19 LAB — HEPATIC FUNCTION PANEL
ALT: 13 U/L (ref 0–44)
AST: 21 U/L (ref 15–41)
Albumin: 3.6 g/dL (ref 3.5–5.0)
Alkaline Phosphatase: 87 U/L (ref 38–126)
Bilirubin, Direct: 0.2 mg/dL (ref 0.0–0.2)
Indirect Bilirubin: 0.8 mg/dL (ref 0.3–0.9)
Total Bilirubin: 1 mg/dL (ref 0.3–1.2)
Total Protein: 6.3 g/dL — ABNORMAL LOW (ref 6.5–8.1)

## 2020-06-19 LAB — LACTIC ACID, PLASMA: Lactic Acid, Venous: 1 mmol/L (ref 0.5–1.9)

## 2020-06-19 LAB — TROPONIN I (HIGH SENSITIVITY)
Troponin I (High Sensitivity): 73 ng/L — ABNORMAL HIGH (ref <18)
Troponin I (High Sensitivity): 66 ng/L — ABNORMAL HIGH (ref <18)

## 2020-06-19 LAB — RESPIRATORY PANEL BY RT PCR (FLU A&B, COVID)
Influenza A by PCR: NEGATIVE
Influenza B by PCR: NEGATIVE
SARS Coronavirus 2 by RT PCR: NEGATIVE

## 2020-06-19 LAB — CBG MONITORING, ED: Glucose-Capillary: 71 mg/dL (ref 70–99)

## 2020-06-19 LAB — BRAIN NATRIURETIC PEPTIDE: B Natriuretic Peptide: 330.8 pg/mL — ABNORMAL HIGH (ref 0.0–100.0)

## 2020-06-19 MED ORDER — FUROSEMIDE 10 MG/ML IJ SOLN
40.0000 mg | Freq: Once | INTRAMUSCULAR | Status: AC
  Administered 2020-06-19: 40 mg via INTRAVENOUS
  Filled 2020-06-19: qty 4

## 2020-06-19 MED ORDER — FLUOXETINE HCL 20 MG PO CAPS
40.0000 mg | ORAL_CAPSULE | Freq: Every day | ORAL | Status: DC
  Administered 2020-06-19 – 2020-06-30 (×12): 40 mg via ORAL
  Filled 2020-06-19 (×15): qty 2

## 2020-06-19 MED ORDER — ONDANSETRON HCL 4 MG/2ML IJ SOLN: 4.0000 mg | Freq: Four times a day (QID) | INTRAMUSCULAR | Status: DC | PRN

## 2020-06-19 MED ORDER — INSULIN ASPART 100 UNIT/ML ~~LOC~~ SOLN
0.0000 [IU] | Freq: Every day | SUBCUTANEOUS | Status: DC
  Administered 2020-06-20: 2 [IU] via SUBCUTANEOUS
  Administered 2020-06-21: 3 [IU] via SUBCUTANEOUS
  Administered 2020-06-28: 2 [IU] via SUBCUTANEOUS

## 2020-06-19 MED ORDER — CHOLESTYRAMINE 4 G PO PACK
4.0000 g | PACK | Freq: Every day | ORAL | Status: DC
  Administered 2020-06-20 – 2020-06-30 (×10): 4 g via ORAL
  Filled 2020-06-19 (×15): qty 1

## 2020-06-19 MED ORDER — INSULIN REGULAR HUMAN (CONC) 500 UNIT/ML ~~LOC~~ SOPN: 55.0000 [IU] | PEN_INJECTOR | SUBCUTANEOUS | Status: DC

## 2020-06-19 MED ORDER — SODIUM CHLORIDE 0.9% FLUSH
3.0000 mL | Freq: Two times a day (BID) | INTRAVENOUS | Status: DC
  Administered 2020-06-19 – 2020-07-02 (×25): 3 mL via INTRAVENOUS

## 2020-06-19 MED ORDER — FUROSEMIDE 10 MG/ML IJ SOLN: 60.0000 mg | Freq: Two times a day (BID) | INTRAMUSCULAR | Status: DC

## 2020-06-19 MED ORDER — ROSUVASTATIN CALCIUM 20 MG PO TABS
20.0000 mg | ORAL_TABLET | Freq: Every day | ORAL | Status: DC
  Administered 2020-06-19 – 2020-07-02 (×13): 20 mg via ORAL
  Filled 2020-06-19 (×2): qty 1
  Filled 2020-06-19: qty 4
  Filled 2020-06-19 (×10): qty 1

## 2020-06-19 MED ORDER — INSULIN ASPART 100 UNIT/ML ~~LOC~~ SOLN
4.0000 [IU] | Freq: Three times a day (TID) | SUBCUTANEOUS | Status: DC
  Administered 2020-06-20 – 2020-06-21 (×3): 4 [IU] via SUBCUTANEOUS

## 2020-06-19 MED ORDER — LOPERAMIDE HCL 2 MG PO CAPS: 2.0000 mg | ORAL_CAPSULE | Freq: Every day | ORAL | Status: DC | PRN

## 2020-06-19 MED ORDER — ISOSORBIDE MONONITRATE ER 30 MG PO TB24
30.0000 mg | ORAL_TABLET | Freq: Every day | ORAL | Status: DC
  Administered 2020-06-20 – 2020-06-24 (×5): 30 mg via ORAL
  Filled 2020-06-19 (×5): qty 1

## 2020-06-19 MED ORDER — INSULIN ASPART 100 UNIT/ML ~~LOC~~ SOLN
0.0000 [IU] | Freq: Three times a day (TID) | SUBCUTANEOUS | Status: DC
  Administered 2020-06-20 (×2): 5 [IU] via SUBCUTANEOUS
  Administered 2020-06-20: 3 [IU] via SUBCUTANEOUS
  Administered 2020-06-21: 11 [IU] via SUBCUTANEOUS
  Administered 2020-06-21: 8 [IU] via SUBCUTANEOUS
  Administered 2020-06-21 – 2020-06-22 (×2): 5 [IU] via SUBCUTANEOUS
  Administered 2020-06-22: 3 [IU] via SUBCUTANEOUS
  Administered 2020-06-22: 5 [IU] via SUBCUTANEOUS
  Administered 2020-06-23: 2 [IU] via SUBCUTANEOUS
  Administered 2020-06-24 (×2): 3 [IU] via SUBCUTANEOUS
  Administered 2020-06-24: 11 [IU] via SUBCUTANEOUS
  Administered 2020-06-25: 3 [IU] via SUBCUTANEOUS
  Administered 2020-06-25: 5 [IU] via SUBCUTANEOUS
  Administered 2020-06-26 (×3): 3 [IU] via SUBCUTANEOUS
  Administered 2020-06-27: 5 [IU] via SUBCUTANEOUS
  Administered 2020-06-27 – 2020-06-29 (×4): 3 [IU] via SUBCUTANEOUS
  Administered 2020-06-29: 5 [IU] via SUBCUTANEOUS

## 2020-06-19 MED ORDER — ACETAMINOPHEN 325 MG PO TABS
650.0000 mg | ORAL_TABLET | ORAL | Status: DC | PRN
  Administered 2020-06-23 – 2020-06-30 (×15): 650 mg via ORAL
  Filled 2020-06-19 (×15): qty 2

## 2020-06-19 MED ORDER — CLOPIDOGREL BISULFATE 75 MG PO TABS
75.0000 mg | ORAL_TABLET | Freq: Every day | ORAL | Status: DC
  Administered 2020-06-20 – 2020-06-30 (×11): 75 mg via ORAL
  Filled 2020-06-19 (×11): qty 1

## 2020-06-19 MED ORDER — HYDRALAZINE HCL 10 MG PO TABS: 10.0000 mg | ORAL_TABLET | Freq: Four times a day (QID) | ORAL | Status: DC | PRN

## 2020-06-19 MED ORDER — INSULIN ASPART 100 UNIT/ML ~~LOC~~ SOLN: 0.0000 [IU] | Freq: Three times a day (TID) | SUBCUTANEOUS | Status: DC

## 2020-06-19 MED ORDER — CARVEDILOL 3.125 MG PO TABS
3.1250 mg | ORAL_TABLET | Freq: Two times a day (BID) | ORAL | Status: DC
  Administered 2020-06-20 – 2020-06-30 (×21): 3.125 mg via ORAL
  Filled 2020-06-19 (×24): qty 1

## 2020-06-19 MED ORDER — APIXABAN 5 MG PO TABS
5.0000 mg | ORAL_TABLET | Freq: Two times a day (BID) | ORAL | Status: DC
  Administered 2020-06-19 – 2020-07-02 (×24): 5 mg via ORAL
  Filled 2020-06-19 (×25): qty 1

## 2020-06-19 MED ORDER — SODIUM CHLORIDE 0.9 % IV SOLN
250.0000 mL | INTRAVENOUS | Status: DC | PRN
  Administered 2020-07-03: 250 mL via INTRAVENOUS

## 2020-06-19 MED ORDER — SODIUM CHLORIDE 0.9% FLUSH
3.0000 mL | INTRAVENOUS | Status: DC | PRN
  Administered 2020-06-30: 3 mL via INTRAVENOUS

## 2020-06-19 NOTE — ED Notes (Signed)
Hospitalist paged to discuss neb tx for pt. Pt is constantly c/o shortness of breath.

## 2020-06-19 NOTE — H&P (Signed)
History and Physical    Vernon Briggs LGX:[id number] DOB: November 28, 1945 DOA: 06/16/2020  PCP: Dr. Dellis Anes (newly changed) Patient coming from: Home  Chief Complaint: SOB, LE edema  HPI: Vernon Briggs is a 74 y.o. male with medical history significant of DM2, H/O stroke, PAF, OSA, HTN, CAD with chronic HFrEF, GERD, anxiety, HLD, HTN who presented with worsening SOB and swelling.  Per patient and his wife, Vernon Briggs was recently in the hospital in September for renal failure and hyperkalemia.  He had some mild SOB at that time. Due to the issues with his kidneys, he had his lisinopril, spironolactone stopped and his torsemide halved to 20mg  daily.  His wife monitors his medications and can attest to these changes.  Since that time he has been weaker at home and has been slowly getting worse over the last week and a half.  Specifically he has been having worsening SOB, worsening lower extremity edema, decreased PO intake, worsening weakness and increasing blood pressure.  She takes his blood pressure daily and noted an increase to the 160s/70s at home.  He further has been having worsening orthopnea and increased PND.  He tried to use his home CPAP today to help and this did not improve his symptoms so they called EMS.  He was placed on oxygen and feels better.  He further has had a mild cough in the hospital and wheezing.  He denies chest pain, fever, chills, nausea, change in vision, headache, abdominal pain, recent falls.  He has had generalized weakness, chronic diarrhea (unchanged) and swelling.    ED Course: In the ED, he was found to have a stable Cr, elevated troponin at 73.  LFTs and BNP are pending.  CXR showed vascular congestion and left sided effusion.  EKG showed a V paced rhythm which was similar to previous.   Review of Systems: As per HPI otherwise all other systems reviewed and are negative.   Past Medical History:  Diagnosis Date  . AICD (automatic cardioverter/defibrillator)  present   . Anxiety   . Arthritis    "occasionally in my right hand" (04/02/2015)  . CAD in native artery 1999  . Cancer ([place])   . CHF (congestive heart failure) ([place])   . Depression   . GERD (gastroesophageal reflux disease)    "occasionally" (04/02/2015)  . History of hiatal hernia   . Hypercholesterolemia   . Hypertension   . Myocardial infarction ([place]) "several"  . OSA on CPAP   . PAF (paroxysmal atrial fibrillation) ([place])   . PTSD (post-traumatic stress disorder)   . Stroke ([place])    "I've had 2; my right leg & arm slightly weaker since" (04/02/2015)  . Type II diabetes mellitus ([place])     Past Surgical History:  Procedure Laterality Date  . BIOPSY  05/09/2020   Procedure: BIOPSY;  Surgeon: Ladene Artist, MD;  Location: [facility] ENDOSCOPY;  Service: Endoscopy;;  . [place]  . COLONOSCOPY WITH PROPOFOL N/A 05/09/2020   Procedure: COLONOSCOPY WITH PROPOFOL;  Surgeon: Ladene Artist, MD;  Location: [facility] ENDOSCOPY;  Service: Endoscopy;  Laterality: N/A;  . CORONARY ANGIOPLASTY WITH STENT PLACEMENT     "I've got 7 stents total" (04/02/2015)  . CORONARY ARTERY BYPASS GRAFT  1999   CABG X4; LIMA-LAD, SVG-OM1, SVG-RI, SVG-dRCA  . CORONARY STENT INTERVENTION N/A 11/24/2019   Procedure: CORONARY STENT INTERVENTION;  Surgeon: [name], MD;  Location: [facility] CV LAB;  Service: Cardiovascular;  Laterality: N/A;  .  INTRAVASCULAR ULTRASOUND/IVUS N/A 11/24/2019   Procedure: Intravascular Ultrasound/IVUS;  Surgeon: [name], MD;  Location: [place] CV LAB;  Service: Cardiovascular;  Laterality: N/A;  . LEFT HEART CATH N/A 11/24/2019   Procedure: Left Heart Cath;  Surgeon: [name], MD;  Location: [place] CV LAB;  Service: Cardiovascular;  Laterality: N/A;  . LEFT HEART CATH AND CORS/GRAFTS ANGIOGRAPHY N/A 11/21/2019   Procedure: LEFT HEART CATH AND CORS/GRAFTS ANGIOGRAPHY;  Surgeon: Burnell Blanks, MD;  Location: [place]  CV LAB;  Service: Cardiovascular;  Laterality: N/A;  . LEFT HEART CATHETERIZATION WITH CORONARY/GRAFT ANGIOGRAM N/A 09/14/2014   Procedure: LEFT HEART CATHETERIZATION WITH Beatrix Fetters;  Surgeon: Leonie Man, MD;  Location: [facility] CATH LAB;  Service: Cardiovascular;  Laterality: N/A;  . PERCUTANEOUS CORONARY STENT INTERVENTION (PCI-S) Left 09/14/2014   Procedure: PERCUTANEOUS CORONARY STENT INTERVENTION (PCI-S);  Surgeon: Leonie Man, MD;  Location: [facility] CATH LAB;  Service: Cardiovascular;  Laterality: Left;  . POLYPECTOMY  05/09/2020   Procedure: POLYPECTOMY;  Surgeon: Ladene Artist, MD;  Location: [facility] ENDOSCOPY;  Service: Endoscopy;;    Social History  reports that he has quit smoking. His smoking use included cigarettes. He has a 0.50 pack-year smoking history. He has never used smokeless tobacco. He reports current alcohol use. He reports that he does not use drugs.  Allergies  Allergen Reactions  . Tape Other (See Comments)    Causes skin irritation    Family History  Problem Relation Age of Onset  . Diabetes Mellitus II Mother   . Heart disease Father     Prior to Admission medications   Medication Sig Start Date End Date Taking? Authorizing Provider  acetaminophen (TYLENOL) 325 MG tablet Take 2 tablets (650 mg total) by mouth every 4 (four) hours as needed for headache or mild pain. Patient taking differently: Take 325-650 mg by mouth every 4 (four) hours as needed for headache or mild pain.  09/15/14   Erlene Quan, PA-C  apixaban (ELIQUIS) 5 MG TABS tablet Take 1 tablet (5 mg total) by mouth 2 (two) times daily. Start taking on 05/12/20 05/12/20   Oswald Hillock, MD  carvedilol (COREG) 3.125 MG tablet Take 1 tablet (3.125 mg total) by mouth 2 (two) times daily with a meal. 11/26/19 05/06/20  Amin, Jeanella Flattery, MD  Cholecalciferol (VITAMIN D-3) 25 MCG (1000 UT) CAPS Take 1,000 Units by mouth daily.    [provider]  cholestyramine (QUESTRAN) 4 g packet Take 1  packet (4 g total) by mouth daily at 2 PM. 05/10/20   Oswald Hillock, MD  clopidogrel (PLAVIX) 75 MG tablet Take 1 tablet (75 mg total) by mouth daily. Start taking on 05/14/20 05/14/20   Oswald Hillock, MD  FLUoxetine (PROZAC) 20 MG capsule Take 40 mg by mouth at bedtime.     [provider]  hydroxypropyl methylcellulose / hypromellose (ISOPTO TEARS / GONIOVISC) 2.5 % ophthalmic solution Place 1 drop into both eyes 2 (two) times daily as needed for dry eyes.     [provider]  insulin regular human CONCENTRATED (HUMULIN R U-500 KWIKPEN) 500 UNIT/ML kwikpen Inject 55-110 Units into the skin See admin instructions. Inject 110 units into the skin in the morning before breakfast and 55 units at bedtime    [provider]  isosorbide mononitrate (IMDUR) 30 MG 24 hr tablet Take 1 tablet (30 mg total) by mouth daily. 11/27/19   Amin, Jeanella Flattery, MD  ketoconazole (NIZORAL) 2 % cream  Apply 1 application topically daily as needed for irritation.    [provider]  loperamide (IMODIUM A-D) 2 MG tablet Take 2 mg by mouth daily as needed for diarrhea or loose stools.     [provider]  nitroGLYCERIN (NITROSTAT) 0.4 MG SL tablet Place 1 tablet (0.4 mg total) under the tongue every 5 (five) minutes x 3 doses as needed for chest pain. Patient not taking: Reported on 05/06/2020 09/15/14   Erlene Quan, PA-C  rosuvastatin (CRESTOR) 20 MG tablet Take 1 tablet (20 mg total) by mouth at bedtime. 11/26/19   Amin, Jeanella Flattery, MD  torsemide (DEMADEX) 20 MG tablet Take 1 tablet (20 mg total) by mouth daily. 05/10/20   Oswald Hillock, MD    Physical Exam: Constitutional: Sitting up in bed, tachypneic.  Vitals:   06/20/2020 2025  BP: (!) 194/101  Pulse: 70  Resp: 16  Temp: 98.2 F (36.8 C)  TempSrc: Oral  SpO2: 99%   Eyes:  lids and conjunctivae normal ENMT: Mucous membranes are moist. Normal dentition.  Neck: normal, supple Respiratory: Decreased breath sounds at bases,  crackles at mid lungs, wheezing  Cardiovascular: RR, NR, no murmur noted but difficult to appreciate heart sounds due to upper airway sounds.  He has 2+ pitting to knees.  No pre-sacral edema.  He has palpable pulses in DP.  Abdomen: + distention, soft, NT, difficult to hear bowel sounds Musculoskeletal: no clubbing / cyanosis. No contractures.  Normal muscle tone.  Skin: He has chronic skin changes to toes and nails.  He has multiple SK's on chest and back.  Neurologic: General weakness, but neuro grossly intact, able to move in bed independently Psychiatric: Normal judgment and insight. Alert and oriented x 3. Normal mood.    Labs on Admission: I have personally reviewed following labs and imaging studies  CBC: Recent Labs  Lab 06/09/2020 1730  WBC 8.6  HGB 13.0  HCT 40.7  MCV 101.5*  PLT 120*    Basic Metabolic Panel: Recent Labs  Lab 06/11/2020 1730  NA 143  K 3.9  CL 109  CO2 23  GLUCOSE 83  BUN 36*  CREATININE 1.66*  CALCIUM 9.1    GFR: CrCl cannot be calculated (Unknown ideal weight.).  Liver Function Tests: No results for input(s): AST, ALT, ALKPHOS, BILITOT, PROT, ALBUMIN in the last 168 hours.  Urine analysis:    Component Value Date/Time   COLORURINE YELLOW 05/05/2020 1949   APPEARANCEUR CLEAR 05/05/2020 1949   LABSPEC 1.009 05/05/2020 1949   PHURINE 5.0 05/05/2020 1949   GLUCOSEU NEGATIVE 05/05/2020 1949   HGBUR NEGATIVE 05/05/2020 1949   BILIRUBINUR NEGATIVE 05/05/2020 1949   KETONESUR NEGATIVE 05/05/2020 1949   PROTEINUR NEGATIVE 05/05/2020 1949   UROBILINOGEN 0.2 04/02/2015 1341   NITRITE NEGATIVE 05/05/2020 1949   LEUKOCYTESUR NEGATIVE 05/05/2020 1949    Radiological Exams on Admission: DG Chest 2 View  Result Date: 06/05/2020 CLINICAL DATA:  Shortness of breath EXAM: CHEST - 2 VIEW COMPARISON:  05/05/2020 FINDINGS: Cardiomegaly. Left pacing device remains in place unchanged. Prior CABG. Mild vascular congestion. Right base atelectasis. Small  left pleural effusion. IMPRESSION: Cardiomegaly, vascular congestion. Small left effusion. Right base atelectasis. Electronically Signed   By: Rolm Baptise M.D.   On: 06/30/2020 17:45    EKG: Independently reviewed. V paced rhythm, normal rate  Assessment/Plan Acute systolic (congestive) heart failure - Recently with decrease in medications including decreased torsemide, d/c of spironolactone leading to a slow accumulation of interstitial fluid,  worsening over past week - Admit to telemetry - IV lasix at 60mg  BID - Strict I/O, daily weights - Fluid restriction to 1826mL - Continue Carvedilol, crestor, plavix, imdur - Would consider Cardiology or HF team consult in the AM for recommendations about ARB or possibly entresto - Repeat TTE ordered - BNP and LFTs pending  S/P CABG x 12-1997 Cardiomyopathy, ischemic  Automatic implantable cardioverter-defibrillator in situ - Initial TnI 73, will trend - Telemetry - Continue plavix, crestor, coreg, imdur - Monitor for chest pain - EKG for any changes    Type II diabetes mellitus with complication - He is on Z-[id number], 110 units in the AM and 55 units in the PM, which will be continued - SSI - Heart/Carb modified diet    Hyperlipidemia with target LDL less than 70 - Continue crestor    Essential hypertension - BP has been slowly rising since stopping lisinopril - Continue carvedilol - Hydralazine PRN for SBP > 180    PAF (paroxysmal atrial fibrillation) - Currently V paced on monitor and EKG - Continue carvedilol - Continue eliquis    Weakness generalized - PT/OT - May benefit from cardiac rehab    CKD (chronic kidney disease) stage 3, GFR 30-59 ml/min - Renal function is stable from discharge, monitor daily while on diuresis  OSA on CPAP - CPAP at night  Chronic diarrhea - Continue cholestyramine - PRN loperamide.    DVT prophylaxis: Eliquis  Code Status:   DNR - confirmed with patient  Family Communication:  Wife Linda  at bedside  Disposition Plan:   Patient is from:  Home  Anticipated DC to:  Rehab  Anticipated DC date:  06/23/20  Anticipated DC barriers: Improved function, improved breathing  Consults called:  Will need CHF in the AM  Admission status:  Telemetry INP   Severity of Illness: The appropriate patient status for this patient is INPATIENT. Inpatient status is judged to be reasonable and necessary in order to provide the required intensity of service to ensure the patient's safety. The patient's presenting symptoms, physical exam findings, and initial radiographic and laboratory data in the context of their chronic comorbidities is felt to place them at high risk for further clinical deterioration. Furthermore, it is not anticipated that the patient will be medically stable for discharge from the hospital within 2 midnights of admission. The following factors support the patient status of inpatient.   " The patient's presenting symptoms include SOB, swelling. " The worrisome physical exam findings include LE edema, fluid on the lungs, elevated BP. " The initial radiographic and laboratory data are worrisome because of CXR with edema. " The chronic co-morbidities include CHF, DM2, CAD.   * I certify that at the point of admission it is my clinical judgment that the patient will require inpatient hospital care spanning beyond 2 midnights from the point of admission due to high intensity of service, high risk for further deterioration and high frequency of surveillance required.Gilles Chiquito MD [facility]  How to contact the [facility] Attending or Consulting provider [place] or covering provider during after hours [place], for this patient?   1. Check the care team in [facility] and look for a) attending/consulting [facility] provider listed and b) the [facility] team listed 2. Log into [email or web address] and use [place] universal password to access. If you do not have the password, please contact the  hospital operator. 3. Locate the [facility] provider you are looking for under  [facility] and page to a number that you can be directly reached. 4. If you still have difficulty reaching the provider, please page the [facility] (Director on Call) for the Hospitalists listed on amion for assistance.  06/28/2020, 8:49 PM

## 2020-06-19 NOTE — ED Provider Notes (Signed)
[facility] EMERGENCY DEPARTMENT Provider Note   CSN: [id number] Arrival date & time: 06/26/2020  1719     History Chief Complaint  Patient presents with  . Shortness of Breath    Vernon Briggs is a 74 y.o. male.  HPI Patient reports he has been getting increasing short of breath for several days.  does not use home oxygen at baseline.  He however reports that he has very limited physical capacity due to chronic shortness of breath.  He reports he mostly stays sedentary in a chair watching television or doing necessary activities to care for himself in his home.  He reports that his wife takes care of him.  She manages medications.  Patient denies he is experiencing any chest pain.  He denies he has had cough or fever.  He reports he has had increased swelling in his legs and his feet.  He reports he normally sleeps on his side in the bed.  He does not sleep propped up on pillows.  Patient does not know of any exposure to anyone with Covid.  He has not had fever chills or body aches.  Patient has not been vaccinated.  He is not aware of any medication changes however review of EMR indicates he was hospitalized and discharged September 6.  At that time Aldactone was discontinued.  Patient takes Eliquis for atrial fibrillation.    Past Medical History:  Diagnosis Date  . AICD (automatic cardioverter/defibrillator) present   . Anxiety   . Arthritis    "occasionally in my right hand" (04/02/2015)  . CAD in native artery 1999  . Cancer ([place])   . CHF (congestive heart failure) ([place])   . Depression   . GERD (gastroesophageal reflux disease)    "occasionally" (04/02/2015)  . History of hiatal hernia   . Hypercholesterolemia   . Hypertension   . Myocardial infarction ([place]) "several"  . OSA on CPAP   . PAF (paroxysmal atrial fibrillation) ([place])   . PTSD (post-traumatic stress disorder)   . Stroke ([place])    "I've had 2; my right leg & arm slightly weaker since"  (04/02/2015)  . Type II diabetes mellitus [facility])     Patient Active Problem List   Diagnosis Date Noted  . Benign neoplasm of ascending colon   . Benign neoplasm of transverse colon   . Benign neoplasm of descending colon   . Benign neoplasm of rectum   . Diarrhea   . Volume depletion   . Palliative care by specialist   . Goals of care, counseling/discussion   . Advanced directives, counseling/discussion   . Generalized weakness   . AKI (acute kidney injury) ([place]) 05/05/2020  . Hyperkalemia 05/05/2020  . CKD (chronic kidney disease) stage 3, GFR 30-59 ml/min (HCC) 05/05/2020  . Acute systolic heart failure ([place])   . CKD (chronic kidney disease) stage 2, GFR 60-89 ml/min 11/20/2019  . NSTEMI (non-ST elevated myocardial infarction) ([place]) 11/20/2019  . Obesity 11/20/2019  . Unstable angina ([place])   . Chest pain 04/02/2015  . Acute renal failure ([place]) 04/02/2015  . Weakness generalized 04/02/2015  . Cardiomyopathy, ischemic 09/15/2014  . CAD S/P CFX DES 09/14/14 09/15/2014  . PAF (paroxysmal atrial fibrillation) ([place]) 09/15/2014  . Chronic anticoagulation-Coumadin 09/15/2014  . S/P CABG x 12-1997   . Elevated troponin   . Abnormal nuclear stress test 09/11/2014    Class: Diagnosis of  . Acute coronary syndrome ([place]) 09/09/2014  . Hypertensive urgency 09/09/2014  . Essential hypertension   .  History of CVA (cerebrovascular accident) 10/20/2012  . TIA (transient ischemic attack) 10/20/2012  . Automatic implantable cardioverter-defibrillator in situ 10/20/2012  . Type II diabetes mellitus with complication ([place]) 97/98/9211  . Hyperlipidemia with target LDL less than 70 10/20/2012  . Thrombocytopenia-plts 90-70K 10/20/2012    Past Surgical History:  Procedure Laterality Date  . BIOPSY  05/09/2020   Procedure: BIOPSY;  Surgeon: Ladene Artist, MD;  Location: [facility] ENDOSCOPY;  Service: Endoscopy;;  . [place]  . COLONOSCOPY WITH PROPOFOL N/A  05/09/2020   Procedure: COLONOSCOPY WITH PROPOFOL;  Surgeon: Ladene Artist, MD;  Location: [facility] ENDOSCOPY;  Service: Endoscopy;  Laterality: N/A;  . CORONARY ANGIOPLASTY WITH STENT PLACEMENT     "I've got 7 stents total" (04/02/2015)  . CORONARY ARTERY BYPASS GRAFT  1999   CABG X4; LIMA-LAD, SVG-OM1, SVG-RI, SVG-dRCA  . CORONARY STENT INTERVENTION N/A 11/24/2019   Procedure: CORONARY STENT INTERVENTION;  Surgeon: [name], MD;  Location: [facility] CV LAB;  Service: Cardiovascular;  Laterality: N/A;  . INTRAVASCULAR ULTRASOUND/IVUS N/A 11/24/2019   Procedure: Intravascular Ultrasound/IVUS;  Surgeon: [name], MD;  Location: [facility] CV LAB;  Service: Cardiovascular;  Laterality: N/A;  . LEFT HEART CATH N/A 11/24/2019   Procedure: Left Heart Cath;  Surgeon: [name], MD;  Location: [facility] CV LAB;  Service: Cardiovascular;  Laterality: N/A;  . LEFT HEART CATH AND CORS/GRAFTS ANGIOGRAPHY N/A 11/21/2019   Procedure: LEFT HEART CATH AND CORS/GRAFTS ANGIOGRAPHY;  Surgeon: Burnell Blanks, MD;  Location: [facility] CV LAB;  Service: Cardiovascular;  Laterality: N/A;  . LEFT HEART CATHETERIZATION WITH CORONARY/GRAFT ANGIOGRAM N/A 09/14/2014   Procedure: LEFT HEART CATHETERIZATION WITH Beatrix Fetters;  Surgeon: Leonie Man, MD;  Location: [facility] CATH LAB;  Service: Cardiovascular;  Laterality: N/A;  . PERCUTANEOUS CORONARY STENT INTERVENTION (PCI-S) Left 09/14/2014   Procedure: PERCUTANEOUS CORONARY STENT INTERVENTION (PCI-S);  Surgeon: Leonie Man, MD;  Location: [facility] CATH LAB;  Service: Cardiovascular;  Laterality: Left;  . POLYPECTOMY  05/09/2020   Procedure: POLYPECTOMY;  Surgeon: Ladene Artist, MD;  Location: [facility] ENDOSCOPY;  Service: Endoscopy;;       Family History  Family history unknown: Yes    Social History   Tobacco Use  . Smoking status: Former Smoker    Packs/day: 0.50    Years: 1.00    Pack years: 0.50    Types: Cigarettes  .  Smokeless tobacco: Never Used  . Tobacco comment: "quit smoking cigarettes in the 1960's"  Substance Use Topics  . Alcohol use: Yes    Comment: 04/02/2015 "might have a beer and a mixed drink/month"  . Drug use: No    Home Medications Prior to Admission medications   Medication Sig Start Date End Date Taking? Authorizing Provider  acetaminophen (TYLENOL) 325 MG tablet Take 2 tablets (650 mg total) by mouth every 4 (four) hours as needed for headache or mild pain. Patient taking differently: Take 325-650 mg by mouth every 4 (four) hours as needed for headache or mild pain.  09/15/14   Erlene Quan, PA-C  apixaban (ELIQUIS) 5 MG TABS tablet Take 1 tablet (5 mg total) by mouth 2 (two) times daily. Start taking on 05/12/20 05/12/20   Oswald Hillock, MD  carvedilol (COREG) 3.125 MG tablet Take 1 tablet (3.125 mg total) by mouth 2 (two) times daily with a meal. 11/26/19 05/06/20  Amin, Jeanella Flattery, MD  Cholecalciferol (VITAMIN D-3) 25 MCG (1000 UT) CAPS  Take 1,000 Units by mouth daily.    [provider]  cholestyramine (QUESTRAN) 4 g packet Take 1 packet (4 g total) by mouth daily at 2 PM. 05/10/20   Oswald Hillock, MD  clopidogrel (PLAVIX) 75 MG tablet Take 1 tablet (75 mg total) by mouth daily. Start taking on 05/14/20 05/14/20   Oswald Hillock, MD  FLUoxetine (PROZAC) 20 MG capsule Take 40 mg by mouth at bedtime.     [provider]  hydroxypropyl methylcellulose / hypromellose (ISOPTO TEARS / GONIOVISC) 2.5 % ophthalmic solution Place 1 drop into both eyes 2 (two) times daily as needed for dry eyes.     [provider]  insulin regular human CONCENTRATED (HUMULIN R U-500 KWIKPEN) 500 UNIT/ML kwikpen Inject 55-110 Units into the skin See admin instructions. Inject 110 units into the skin in the morning before breakfast and 55 units at bedtime    [provider]  isosorbide mononitrate (IMDUR) 30 MG 24 hr tablet Take 1 tablet (30 mg total) by mouth daily. 11/27/19   Amin,  Jeanella Flattery, MD  ketoconazole (NIZORAL) 2 % cream Apply 1 application topically daily as needed for irritation.    [provider]  loperamide (IMODIUM A-D) 2 MG tablet Take 2 mg by mouth daily as needed for diarrhea or loose stools.     [provider]  nitroGLYCERIN (NITROSTAT) 0.4 MG SL tablet Place 1 tablet (0.4 mg total) under the tongue every 5 (five) minutes x 3 doses as needed for chest pain. Patient not taking: Reported on 05/06/2020 09/15/14   Erlene Quan, PA-C  rosuvastatin (CRESTOR) 20 MG tablet Take 1 tablet (20 mg total) by mouth at bedtime. 11/26/19   Amin, Jeanella Flattery, MD  torsemide (DEMADEX) 20 MG tablet Take 1 tablet (20 mg total) by mouth daily. 05/10/20   Oswald Hillock, MD    Allergies    Tape  Review of Systems   Review of Systems 10 systems reviewed and negative except as per HPI Physical Exam Updated Vital Signs There were no vitals taken for this visit.  Physical Exam Constitutional:      Comments: Alert and nontoxic.  No respiratory distress at rest.  HENT:     Head: Normocephalic and atraumatic.     Mouth/Throat:     Pharynx: Oropharynx is clear.  Eyes:     Extraocular Movements: Extraocular movements intact.  Cardiovascular:     Rate and Rhythm: Normal rate and regular rhythm.  Pulmonary:     Comments: No respiratory distress at rest.  Fine basilar crackle Abdominal:     General: There is no distension.     Palpations: Abdomen is soft.     Tenderness: There is no abdominal tenderness. There is no guarding.  Musculoskeletal:     Right lower leg: Edema present.     Left lower leg: Edema present.     Comments: 2+ pitting edema bilateral feet  Skin:    General: Skin is warm and dry.  Neurological:     General: No focal deficit present.     Mental Status: He is oriented to person, place, and time.     Coordination: Coordination normal.  Psychiatric:        Mood and Affect: Mood normal.     ED Results / Procedures / Treatments    Labs (all labs ordered are listed, but only abnormal results are displayed) Labs Reviewed  BASIC METABOLIC PANEL - Abnormal; Notable for the following components:  Result Value   BUN 36 (*)    Creatinine, Ser 1.66 (*)    GFR, Estimated 39 (*)    All other components within normal limits  CBC - Abnormal; Notable for the following components:   RBC 4.01 (*)    MCV 101.5 (*)    Platelets 120 (*)    All other components within normal limits  TROPONIN I (HIGH SENSITIVITY) - Abnormal; Notable for the following components:   Troponin I (High Sensitivity) 73 (*)    All other components within normal limits  RESPIRATORY PANEL BY RT PCR (FLU A&B, COVID)  HEPATIC FUNCTION PANEL  BRAIN NATRIURETIC PEPTIDE  LACTIC ACID, PLASMA  LACTIC ACID, PLASMA  PROTIME-INR  URINALYSIS, ROUTINE W REFLEX MICROSCOPIC  TROPONIN I (HIGH SENSITIVITY)    EKG EKG Interpretation  Date/Time:  Saturday June 19 2020 17:11:24 EDT Ventricular Rate:  73 PR Interval:    QRS Duration: 170 QT Interval:  468 QTC Calculation: 515 R Axis:   -73 Text Interpretation: Ventricular-paced rhythm Abnormal ECG agree, similar to previous Confirmed by Charlesetta Shanks [phone]) on 07/02/2020 7:29:07 PM   Radiology DG Chest 2 View  Result Date: 06/06/2020 CLINICAL DATA:  Shortness of breath EXAM: CHEST - 2 VIEW COMPARISON:  05/05/2020 FINDINGS: Cardiomegaly. Left pacing device remains in place unchanged. Prior CABG. Mild vascular congestion. Right base atelectasis. Small left pleural effusion. IMPRESSION: Cardiomegaly, vascular congestion. Small left effusion. Right base atelectasis. Electronically Signed   By: Rolm Baptise M.D.   On: 06/18/2020 17:45    Procedures Procedures (including critical care time)  Medications Ordered in ED Medications  furosemide (LASIX) injection 40 mg (has no administration in time range)    ED Course  I have reviewed the triage vital signs and the nursing notes.  Pertinent labs &  imaging results that were available during my care of the patient were reviewed by me and considered in my medical decision making (see chart for details).    MDM Rules/Calculators/A&P                         Patient presents with increasing shortness of breath for several days.  At baseline he has congestive heart failure with EF of about 25-30% by echo done 3\2021.  Chest x-ray shows vascular congestion and patient has pedal edema.  This time findings most suggestive of CHF exacerbation.  Due to diarrhea and volume loss approximately 1 month ago, patient was taken off Aldactone.  Patient is not sure which medications he takes because they are distributed by his wife.  Unclear if he has been back on the diuretic or not.  At this time will give Lasix 40 mg IV for volume overload with findings consistent with CHF.  Patient does not have chest pain.  EKG is paced.  First troponin was mildly elevated.  Patient will need admission with serial troponins and monitoring for CHF exacerbation.  Final Clinical Impression(s) / ED Diagnoses Final diagnoses:  Acute on chronic congestive heart failure, unspecified heart failure type [facility])    Rx / DC Orders ED Discharge Orders    None       Charlesetta Shanks, MD 06/08/2020 1951

## 2020-06-19 NOTE — ED Triage Notes (Signed)
Pt to triage via [facility] from home.  Reports SOB x 1 week.  On EMS arrival pt using his Bipap that he normally sleeps with.  Does not wear home O2.  Denies pain.  18g LAC. O2 @ 2liters on arrival.

## 2020-06-20 ENCOUNTER — Inpatient Hospital Stay (HOSPITAL_COMMUNITY): Payer: No Typology Code available for payment source

## 2020-06-20 DIAGNOSIS — I480 Paroxysmal atrial fibrillation: Secondary | ICD-10-CM | POA: Diagnosis not present

## 2020-06-20 DIAGNOSIS — E118 Type 2 diabetes mellitus with unspecified complications: Secondary | ICD-10-CM

## 2020-06-20 DIAGNOSIS — J9601 Acute respiratory failure with hypoxia: Secondary | ICD-10-CM | POA: Diagnosis present

## 2020-06-20 DIAGNOSIS — I5023 Acute on chronic systolic (congestive) heart failure: Secondary | ICD-10-CM

## 2020-06-20 DIAGNOSIS — N1832 Chronic kidney disease, stage 3b: Secondary | ICD-10-CM | POA: Diagnosis not present

## 2020-06-20 DIAGNOSIS — G4733 Obstructive sleep apnea (adult) (pediatric): Secondary | ICD-10-CM

## 2020-06-20 LAB — GLUCOSE, CAPILLARY
Glucose-Capillary: 247 mg/dL — ABNORMAL HIGH (ref 70–99)
Glucose-Capillary: 228 mg/dL — ABNORMAL HIGH (ref 70–99)

## 2020-06-20 LAB — BASIC METABOLIC PANEL
Anion gap: 13 (ref 5–15)
BUN: 31 mg/dL — ABNORMAL HIGH (ref 8–23)
CO2: 23 mmol/L (ref 22–32)
Calcium: 8.7 mg/dL — ABNORMAL LOW (ref 8.9–10.3)
Chloride: 107 mmol/L (ref 98–111)
Creatinine, Ser: 1.65 mg/dL — ABNORMAL HIGH (ref 0.61–1.24)
GFR, Estimated: 40 mL/min — ABNORMAL LOW (ref >60)
Glucose, Bld: 133 mg/dL — ABNORMAL HIGH (ref 70–99)
Potassium: 3.6 mmol/L (ref 3.5–5.1)
Sodium: 143 mmol/L (ref 135–145)

## 2020-06-20 LAB — ECHOCARDIOGRAM COMPLETE
Area-P 1/2: 2.81 cm2
S' Lateral: 4.58 cm
Weight: 4507.97 oz

## 2020-06-20 LAB — CBG MONITORING, ED
Glucose-Capillary: 175 mg/dL — ABNORMAL HIGH (ref 70–99)
Glucose-Capillary: 232 mg/dL — ABNORMAL HIGH (ref 70–99)

## 2020-06-20 MED ORDER — PERFLUTREN LIPID MICROSPHERE
1.0000 mL | INTRAVENOUS | Status: AC | PRN
  Administered 2020-06-20: 2 mL via INTRAVENOUS
  Filled 2020-06-20: qty 10

## 2020-06-20 MED ORDER — FUROSEMIDE 10 MG/ML IJ SOLN
20.0000 mg | Freq: Once | INTRAMUSCULAR | Status: AC
  Administered 2020-06-20: 20 mg via INTRAVENOUS
  Filled 2020-06-20: qty 2

## 2020-06-20 MED ORDER — FUROSEMIDE 10 MG/ML IJ SOLN
60.0000 mg | Freq: Two times a day (BID) | INTRAMUSCULAR | Status: DC
  Administered 2020-06-20 – 2020-06-21 (×3): 60 mg via INTRAVENOUS
  Filled 2020-06-20 (×3): qty 6

## 2020-06-20 MED ORDER — IPRATROPIUM-ALBUTEROL 0.5-2.5 (3) MG/3ML IN SOLN
3.0000 mL | Freq: Four times a day (QID) | RESPIRATORY_TRACT | Status: DC
  Administered 2020-06-20 – 2020-06-21 (×7): 3 mL via RESPIRATORY_TRACT
  Filled 2020-06-20 (×10): qty 3

## 2020-06-20 NOTE — Progress Notes (Signed)
Floor coverage overnight event  Notified by the patient's nurse that he has been complaining of shortness of breath.  Admitted this evening for acute exacerbation of chronic systolic CHF.  Please see H&P from Dr. Daryll Drown.   Patient was seen and examined at bedside.  Complained of chest shortness of breath.  Denied chest pain.  Tachypneic with respiratory rate in the upper 20s.  Not tachycardic.  Satting 99% on 2 L supplemental oxygen.  Bibasilar rales and mild end expiratory wheezing appreciated upon auscultation of the lungs.  +4 pitting edema bilateral lower extremities.  No documented history of asthma or COPD.  Not on any home inhalers.  -Patient has received IV Lasix 40 mg so far.  Will give additional IV Lasix 20 mg x 1 at this time, then continue diuresis with IV Lasix 60 mg twice daily starting in the morning. -DuoNebs every 6 hours

## 2020-06-20 NOTE — ED Notes (Signed)
Lunch Tray Ordered @ 1042. 

## 2020-06-20 NOTE — Assessment & Plan Note (Addendum)
--  stable, continue SSI, start U-500 insulin back

## 2020-06-20 NOTE — Progress Notes (Signed)
Respiratory paged for cpap. Wife will bring home cpap in the morning.

## 2020-06-20 NOTE — Progress Notes (Addendum)
PROGRESS NOTE  Vernon Briggs ALP:[id number] DOB: [birth year] DOA: 06/20/2020 PCP: [name], MD [facility]  Brief History   74 year old man 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 presented with worsening shortness of breath and swelling.  Admitted for acute on chronic systolic CHF  Recently hospitalized September for acute kidney injury and lisinopril, spironolactone was stopped and torsemide was decreased.   A & P  * Acute on chronic systolic CHF (congestive heart failure) ([place]) --In context of recently decreased medications including torsemide, discontinuation of spironolactone.  Followed by the [facility] in [place] but it is very difficult for him to get there.  His wife provides his care.  Hopes to establish with local cardiology. --Continue IV diuresis, daily BMP --Continue Crestor, Plavix, Imdur, beta-blocker --Follow-up repeat TTE --Consider Entresto --We will ask cardiology to see 10/18.  Acute hypoxemic respiratory failure (HCC) --Secondary to acute CHF.  Wean as tolerated.  Treat CHF.  Cardiomyopathy, ischemic --Troponin slightly elevated, but flat, secondary to demand ischemia from acute CHF.  EKG no acute changes. No further evaluation. --Continue Plavix, Crestor, carvedilol, Imdur  Type II diabetes mellitus with complication (HCC) --stable, continue SSI, restart U-500 insulin when CBG trends up  CKD (chronic kidney disease) stage 3, GFR 30-59 ml/min (HCC) --CKD stage IIIb. Stable, at baseline.  Monitor closely while on diuresis.  PAF (paroxysmal atrial fibrillation) (HCC) --paced rhythm, continue apixaban and carvedilol  OSA (obstructive sleep apnea) --continue CPAP QHS  Automatic implantable cardioverter-defibrillator in situ --Asymptomatic  Disposition Plan:  Discussion: Continue diuresis for acute CHF.  Given history of atrial fibrillation we will continue  beta-blocker.  Wean oxygen as tolerated.  Status is: Inpatient  Remains inpatient appropriate because:IV treatments appropriate due to intensity of illness or inability to take PO   Dispo: The patient is from: Home              Anticipated d/c is to: Home              Anticipated d/c date is: 3 days              Patient currently is not medically stable to d/c.  DVT prophylaxis:  apixaban (ELIQUIS) tablet 5 mg    Code Status: DNR Family Communication: wife at bedside  Murray Hodgkins, MD  [facility] Direct contact: see [email or web address] (further directions at bottom of note if needed) 7PM-7AM contact night coverage as at bottom of note 06/20/2020, 12:29 PM  LOS: 1 day   Significant Hospital Events   . 10/16 admit for CHF   Consults:  .    Procedures:  .   Significant Diagnostic Tests:  Marland Kitchen    Micro Data:  .    Antimicrobials:  .   Interval History/Subjective  CC: f/u SOB  Feels more short of breath this morning.  Reports orthopnea.  Nausea or vomiting.  Objective   Vitals:  Vitals:   06/20/20 1000 06/20/20 1145  BP: (!) 154/79   Pulse: 79 80  Resp: (!) 21 (!) 26  Temp:    SpO2: 95% 98%    Exam:  Physical Exam Vitals reviewed.  Constitutional:      General: He is not in acute distress.    Appearance: He is ill-appearing. He is not toxic-appearing.  Eyes:     General: No scleral icterus. Cardiovascular:     Rate and Rhythm: Normal rate and regular rhythm.  Heart sounds: No murmur heard.  No friction rub. No gallop.   Pulmonary:     Effort: No respiratory distress.     Breath sounds: No wheezing, rhonchi or rales.  Abdominal:     General: There is no distension.     Palpations: Abdomen is soft.     Tenderness: There is no abdominal tenderness.     Comments: obese  Musculoskeletal:     Cervical back: No rigidity.     Right lower leg: Edema present.     Left lower leg: Edema present.  Neurological:     Mental Status: He is alert.   Psychiatric:        Mood and Affect: Mood normal.        Behavior: Behavior normal.      I have personally reviewed the following:   Today's Data  . Creatinine stable, potassium within normal limits . Second troponin stable at 66, lactic acid within normal limits  Scheduled Meds: . apixaban  5 mg Oral BID  . carvedilol  3.125 mg Oral BID WC  . cholestyramine  4 g Oral Q1400  . clopidogrel  75 mg Oral Daily  . FLUoxetine  40 mg Oral QHS  . furosemide  60 mg Intravenous Q12H  . insulin aspart  0-15 Units Subcutaneous TID WC  . insulin aspart  0-5 Units Subcutaneous QHS  . insulin aspart  4 Units Subcutaneous TID WC  . ipratropium-albuterol  3 mL Nebulization Q6H  . isosorbide mononitrate  30 mg Oral Daily  . rosuvastatin  20 mg Oral QHS  . sodium chloride flush  3 mL Intravenous Q12H   Continuous Infusions: . sodium chloride      Principal Problem:   Acute on chronic systolic CHF (congestive heart failure) (HCC) Active Problems:   Acute hypoxemic respiratory failure (HCC)   Type II diabetes mellitus with complication (HCC)   Cardiomyopathy, ischemic   PAF (paroxysmal atrial fibrillation) (HCC)   CKD (chronic kidney disease) stage 3, GFR 30-59 ml/min (HCC)   Automatic implantable cardioverter-defibrillator in situ   Hyperlipidemia with target LDL less than 70   Essential hypertension   S/P CABG x 12-1997   Weakness generalized   OSA (obstructive sleep apnea)   LOS: 1 day   How to contact the [facility] Attending or Consulting provider 7A - 7P or covering provider during after hours [place], for this patient?  1. Check the care team in [facility] and look for a) attending/consulting TR[facility] provider listed and b) the [facility] team listed 2. Log into [email or web address] and use [place]'s universal password to access. If you do not have the password, please contact the hospital operator. 3. Locate the [facility] provider you are looking for under [facility] and page to a number that you can be  directly reached. 4. If you still have difficulty reaching the provider, please page the [facility] (Director on Call) for the Hospitalists listed on amion for assistance.

## 2020-06-20 NOTE — Progress Notes (Signed)
  Echocardiogram 2D Echocardiogram has been attempted. Patient receiving personal care. Will reattempt at later time.  Vernon Briggs G [name] 06/20/2020, 10:13 AM

## 2020-06-20 NOTE — Plan of Care (Signed)

## 2020-06-20 NOTE — Assessment & Plan Note (Addendum)
--  In context of recently decreased medications including torsemide, discontinuation of spironolactone.  Followed by the [facility] in [place] but it is very difficult for him to get there.  His wife provides his care.  Wants to establish with local cardiology. --fair UOP but void unrecorded. Will increase Lasix, continue daily BMP --continue Crestor, Plavix, Imdur, beta-blocker; consider Entresto and spironolactone pending cardiology input --TTE w/o sig change, LVEF 25-30% w/ regional [place] --cardiology consult today

## 2020-06-20 NOTE — ED Notes (Signed)
Pt moved to hospital bed for comfort. C/o increased work of breathing and sob, which improved with duoneb. Pt requesting nighttime CPAP. Urinal at bedside. [employer] provided.

## 2020-06-20 NOTE — ED Notes (Signed)
Pt sitting on edge of bed at this time for comfort. Tolerating well.

## 2020-06-20 NOTE — Assessment & Plan Note (Addendum)
--  paced rhythm, continue apixaban and carvedilol

## 2020-06-20 NOTE — Assessment & Plan Note (Addendum)
--  Troponin slightly elevated, but flat, secondary to demand ischemia from acute CHF.  EKG no acute changes. No further evaluation. --Continue Plavix, Crestor, carvedilol, Imdur

## 2020-06-20 NOTE — Progress Notes (Signed)
  Echocardiogram 2D Echocardiogram has been performed.  Vernon Briggs 06/20/2020, 11:55 AM

## 2020-06-20 NOTE — Progress Notes (Addendum)
Patient has arrived onto the unit from [facility]. Patient is a&0x4. Patient verbalizes "he wants to go home with wife" who just left for the day from the [facility]. Wife's phone number is taped to the side of the bed for patient's comfort. Patient's weight and VS has been taken. Telemonitor has been applied and CCMD has been notified about the patient's arrival. Bed alarm is on and call bell is within reach.

## 2020-06-20 NOTE — ED Notes (Signed)
Breakfast ordered 

## 2020-06-20 NOTE — Progress Notes (Signed)
Patient verbalizes he is "choking". Upon assessment, patient is coughing up mucous plug. Water given to help loosen the mucous plug. Patient verbalizes that he would like to sit up at the edge of the bed because "they let me do it while I was in the ED". Relayed to the patient that the patient is lethargic at this moment and it would be unsafe for him to sit on the edge of the bed without constant supervision. Alongside another RN, propped pillows against the patient's back and put the bed in chair mode to help the patient's breathe fuller and better. Patient's verbalizes that the position feels better.

## 2020-06-20 NOTE — Assessment & Plan Note (Addendum)
--  Secondary to acute CHF, stable, wean as tolerated.  Treating CHF.

## 2020-06-20 NOTE — ED Notes (Signed)
Report to Beverlee Nims, Therapist, sports. Wife updated on room and location. Patient on monitor and with all belongings. VSS. PIV SL.

## 2020-06-20 NOTE — Assessment & Plan Note (Addendum)
--  CKD stage IIIb. Stable.  Monitor closely while on diuresis.

## 2020-06-20 NOTE — Assessment & Plan Note (Signed)
Asymptomatic. 

## 2020-06-20 NOTE — Hospital Course (Addendum)
74 year old man 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 presented with worsening shortness of breath and swelling.  Admitted for acute on chronic systolic CHF.   Recently hospitalized September for acute kidney injury and lisinopril, spironolactone was stopped and torsemide was decreased.

## 2020-06-20 NOTE — Assessment & Plan Note (Signed)
--  continue CPAP QHS 

## 2020-06-21 DIAGNOSIS — N1832 Chronic kidney disease, stage 3b: Secondary | ICD-10-CM

## 2020-06-21 DIAGNOSIS — I5023 Acute on chronic systolic (congestive) heart failure: Secondary | ICD-10-CM

## 2020-06-21 DIAGNOSIS — J9601 Acute respiratory failure with hypoxia: Secondary | ICD-10-CM | POA: Diagnosis not present

## 2020-06-21 DIAGNOSIS — I10 Essential (primary) hypertension: Secondary | ICD-10-CM

## 2020-06-21 DIAGNOSIS — Z95810 Presence of automatic (implantable) cardiac defibrillator: Secondary | ICD-10-CM | POA: Diagnosis not present

## 2020-06-21 DIAGNOSIS — I480 Paroxysmal atrial fibrillation: Secondary | ICD-10-CM | POA: Diagnosis not present

## 2020-06-21 LAB — BASIC METABOLIC PANEL
Anion gap: 12 (ref 5–15)
BUN: 39 mg/dL — ABNORMAL HIGH (ref 8–23)
CO2: 25 mmol/L (ref 22–32)
Calcium: 8.8 mg/dL — ABNORMAL LOW (ref 8.9–10.3)
Chloride: 104 mmol/L (ref 98–111)
Creatinine, Ser: 1.72 mg/dL — ABNORMAL HIGH (ref 0.61–1.24)
GFR, Estimated: 38 mL/min — ABNORMAL LOW (ref >60)
Glucose, Bld: 227 mg/dL — ABNORMAL HIGH (ref 70–99)
Potassium: 3.8 mmol/L (ref 3.5–5.1)
Sodium: 141 mmol/L (ref 135–145)

## 2020-06-21 LAB — GLUCOSE, CAPILLARY
Glucose-Capillary: 202 mg/dL — ABNORMAL HIGH (ref 70–99)
Glucose-Capillary: 273 mg/dL — ABNORMAL HIGH (ref 70–99)
Glucose-Capillary: 314 mg/dL — ABNORMAL HIGH (ref 70–99)
Glucose-Capillary: 253 mg/dL — ABNORMAL HIGH (ref 70–99)

## 2020-06-21 MED ORDER — DAPAGLIFLOZIN PROPANEDIOL 10 MG PO TABS
10.0000 mg | ORAL_TABLET | Freq: Every day | ORAL | Status: DC
  Administered 2020-06-21 – 2020-06-30 (×10): 10 mg via ORAL
  Filled 2020-06-21 (×13): qty 1

## 2020-06-21 MED ORDER — FUROSEMIDE 10 MG/ML IJ SOLN
80.0000 mg | Freq: Two times a day (BID) | INTRAMUSCULAR | Status: DC
  Administered 2020-06-21 – 2020-06-22 (×2): 80 mg via INTRAVENOUS
  Filled 2020-06-21 (×2): qty 8

## 2020-06-21 MED ORDER — INSULIN REGULAR HUMAN (CONC) 500 UNIT/ML ~~LOC~~ SOPN
55.0000 [IU] | PEN_INJECTOR | Freq: Two times a day (BID) | SUBCUTANEOUS | Status: DC
  Administered 2020-06-21 – 2020-06-23 (×4): 55 [IU] via SUBCUTANEOUS
  Filled 2020-06-21 (×2): qty 3

## 2020-06-21 MED ORDER — HYDRALAZINE HCL 25 MG PO TABS
25.0000 mg | ORAL_TABLET | Freq: Three times a day (TID) | ORAL | Status: DC
  Administered 2020-06-21 – 2020-06-24 (×8): 25 mg via ORAL
  Filled 2020-06-21 (×9): qty 1

## 2020-06-21 NOTE — Consult Note (Addendum)
The patient has been seen in conjunction with Sande Rives, PAC. All aspects of care have been considered and discussed. The patient has been personally interviewed, examined, and all clinical data has been reviewed.   Acute on chronic combined systolic and diastolic heart failure with volume overload.  In late September diuretic intensity was decreased in the setting of diarrhea and acute kidney injury.  Spironolactone and ACE inhibitor therapy were also discontinued.  Plan more aggressive IV diuresis.  Plan to increase furosemide to 80 mg every 12 hours.  Had dapagliflozin 10 mg/day.  Add hydralazine 25 mg every 8 hours, holding for systolic blood pressure less than 100.  If he does not diurese with increased diuretic intensity, may need right heart cath and to consider advanced heart failure team consultation.  Daily basic metabolic panel.    Cardiology Consultation:   Patient ID: Vernon Briggs MRN: [id number]; DOB: February 09, 1946  Admit date: 06/12/2020 Date of Consult: 06/21/2020  Primary Care Provider: [name], MD [facility] Cardiologist: Fransico Him, MD  [place] Electrophysiologist:  None    Patient Profile:   Vernon Briggs is a 74 y.o. male with a history of CAD s/p CABG x4 (LIMA to LAD, SVG to [facility], SVG to OM1, and SVG to RI) in 1999 with multiple subsequent stenting (PCI x6 around 2011 at [facility] [place] to LCX in 2016, and most recently PCI/DES to left main in 11/2019), chronic combined CHF/ischemic cardiomyopathy with EF of 25-30% on Echo in 11/2018, s/p Biotronik ICD in 2013, paroxsymal atrial fibrillation on Eliquis, prior left frontal CVA, hypertension, hyperlipidemia, type 2 diabetes mellitus, CKD stage III, obstructive sleep apnea on CPAP, anxiety/depression, and PTSD who is being seen today for the evaluation of CHF at the request of Dr. Sarajane Jews.  History of Present Illness:   [name] is a 74 year old male with the above history.  Patient has a long history of CAD as documented above. He was most recently admitted to [facility] in 11/2019 with NSTEMI after presenting with 6 months of intermittent chest pain. Echo showed LVEF of 25-30% with akinesis of mid septum into apex consistent with prior infarct, mildly enlarged RV with mildly reduced systolic function, and mildly elevated PASP of 44.0 mmHg. Left heart cath showed severe left main stenosis with chronic occlusions of the proximal LAD, intermediate branch and SVG to intermediate branch, SVG to OM, and mid RCA. The left main supplies the circumflex only as the LAD is occluded and the mid and distal LAD fills from the patent LIMA graft. The progression of his left main was felt to be the culprit lesion. He was hydrated for a couple of days and then brought back to the cath lab for successful IVUS-guided PCI/DES to the left main and proximal LCX.   Patient was recently admitted from 05/05/2020 to 05/10/2020 for AKI, hyperkalemia, hypotension, and chronic diarrhea. Creatinine peaked at 3.31. Potassium peaked at 7.2 and normalized with IV fluids and Lokelma. Nephrology was consulted. AKI was felt to be due to dehydration from ongoing diarrhea. Nephrology recommended stopping Spironolactone and Lisinopril. GI was consulted for chronic diarrhea.His Torsemide was also decreased to 20mg  daily. Patient underwent colonoscopy which showed multiple polyps in the rectum, in the descending colon, in the transverse colon and in the ascending colon. Polyps were removed and biopsies taken. GI started patient on Cholestyramine 4mg  daily.   Patient presented to the ED on 07/02/2020 via EMS for reports of shortness of breath and generalized weakness.  In the ED, EKG showed ventricular paced rhythm with no acute changes. BNP elevated at 330. High-sensitivity troponin mildly elevated and flat at 73 >> 66. Chest x-ray showed cardiomegaly with vascular congestion, small left effusion, and right base atelectasis.  WBC 8.6, Hgb 13.0, Plts 120. Na 143, K 3.9, Glucose 83, BUN 36, Cr 1.66. LFTs normal. Urinalysis showed proteinuria but otherwise unremarkable. Lactic acid normal. Respiratory panel negative for COVID and Influenza. Patient admitted and started on IV Lasix. Cardiology consulted for further evaluation.  At the time of this evaluation, patient sitting on the edge of the bed in no acute distress. Patient reports steady decline since last admission with gradually worsening shortness of breath, stamina, and strength. He states he can barely due anything due to weakness. He describes shortness of breath all the time but especially with any type of activity. He describes orthopnea and states he sleeps on about a 10 degree incline. He also notes PND even with CPAP machine. He reports lower extremity edema as well as abdominal bloating and states he feels as big as he has every been. However, he is actually down about 15 lbs from where he was last admission. He states he is so weak that sometimes his legs give out on him and falls and has to call EMS to help get him back in bed. He is not very active. He states that he has gone to rehab facilities before which seem to help but as soon as he goes back home he declines again. He admits that he does not do recommended PT activities at home. No angina. No palpitations, lightheadedness, dizziness, or syncope. He notes a productive cough at times but no recent fevers or illnesses. He continues to have chronic diarrhea but this has improved on the Cholestyramine. No abnormal bleeding on the Plavix and Eliquis. He is compliant with all his medications and his wife assists with this.    Past Medical History:  Diagnosis Date  . AICD (automatic cardioverter/defibrillator) present   . Anxiety   . Arthritis    "occasionally in my right hand" (04/02/2015)  . CAD in native artery 1999  . Cancer ([place])   . CHF (congestive heart failure) ([place])   . Depression   . GERD  (gastroesophageal reflux disease)    "occasionally" (04/02/2015)  . History of hiatal hernia   . Hypercholesterolemia   . Hypertension   . Myocardial infarction ([place]) "several"  . OSA on CPAP   . PAF (paroxysmal atrial fibrillation) ([place])   . PTSD (post-traumatic stress disorder)   . Stroke ([place])    "I've had 2; my right leg & arm slightly weaker since" (04/02/2015)  . Type II diabetes mellitus ([place])     Past Surgical History:  Procedure Laterality Date  . BIOPSY  05/09/2020   Procedure: BIOPSY;  Surgeon: Ladene Artist, MD;  Location: [facility] ENDOSCOPY;  Service: Endoscopy;;  . [place]  . COLONOSCOPY WITH PROPOFOL N/A 05/09/2020   Procedure: COLONOSCOPY WITH PROPOFOL;  Surgeon: Ladene Artist, MD;  Location: [facility] ENDOSCOPY;  Service: Endoscopy;  Laterality: N/A;  . CORONARY ANGIOPLASTY WITH STENT PLACEMENT     "I've got 7 stents total" (04/02/2015)  . CORONARY ARTERY BYPASS GRAFT  1999   CABG X4; LIMA-LAD, SVG-OM1, SVG-RI, SVG-dRCA  . CORONARY STENT INTERVENTION N/A 11/24/2019   Procedure: CORONARY STENT INTERVENTION;  Surgeon: [name], MD;  Location: [place] CV LAB;  Service: Cardiovascular;  Laterality: N/A;  .  INTRAVASCULAR ULTRASOUND/IVUS N/A 11/24/2019   Procedure: Intravascular Ultrasound/IVUS;  Surgeon: [name], MD;  Location: [place] CV LAB;  Service: Cardiovascular;  Laterality: N/A;  . LEFT HEART CATH N/A 11/24/2019   Procedure: Left Heart Cath;  Surgeon: [name], MD;  Location: [place] CV LAB;  Service: Cardiovascular;  Laterality: N/A;  . LEFT HEART CATH AND CORS/GRAFTS ANGIOGRAPHY N/A 11/21/2019   Procedure: LEFT HEART CATH AND CORS/GRAFTS ANGIOGRAPHY;  Surgeon: Burnell Blanks, MD;  Location: [place] CV LAB;  Service: Cardiovascular;  Laterality: N/A;  . LEFT HEART CATHETERIZATION WITH CORONARY/GRAFT ANGIOGRAM N/A 09/14/2014   Procedure: LEFT HEART CATHETERIZATION WITH Beatrix Fetters;   Surgeon: Leonie Man, MD;  Location: [facility] CATH LAB;  Service: Cardiovascular;  Laterality: N/A;  . PERCUTANEOUS CORONARY STENT INTERVENTION (PCI-S) Left 09/14/2014   Procedure: PERCUTANEOUS CORONARY STENT INTERVENTION (PCI-S);  Surgeon: Leonie Man, MD;  Location: [facility] CATH LAB;  Service: Cardiovascular;  Laterality: Left;  . POLYPECTOMY  05/09/2020   Procedure: POLYPECTOMY;  Surgeon: Ladene Artist, MD;  Location: [facility] ENDOSCOPY;  Service: Endoscopy;;     Home Medications:  Prior to Admission medications   Medication Sig Start Date End Date Taking? Authorizing Provider  acetaminophen (TYLENOL) 325 MG tablet Take 2 tablets (650 mg total) by mouth every 4 (four) hours as needed for headache or mild pain. Patient taking differently: Take 325-650 mg by mouth every 4 (four) hours as needed for headache or mild pain.  09/15/14  Yes Kilroy, Doreene Burke, PA-C  apixaban (ELIQUIS) 5 MG TABS tablet Take 1 tablet (5 mg total) by mouth 2 (two) times daily. Start taking on 05/12/20 Patient taking differently: Take 5 mg by mouth 2 (two) times daily.  05/12/20  Yes Oswald Hillock, MD  carvedilol (COREG) 3.125 MG tablet Take 1 tablet (3.125 mg total) by mouth 2 (two) times daily with a meal. 11/26/19 06/04/2020 Yes Amin, Jeanella Flattery, MD  Cholecalciferol (VITAMIN D-3) 25 MCG (1000 UT) CAPS Take 1,000 Units by mouth daily.   Yes [provider]  cholestyramine (QUESTRAN) 4 g packet Take 1 packet (4 g total) by mouth daily at 2 PM. 05/10/20  Yes Darrick Meigs, Marge Duncans, MD  clopidogrel (PLAVIX) 75 MG tablet Take 1 tablet (75 mg total) by mouth daily. Start taking on 05/14/20 Patient taking differently: Take 75 mg by mouth daily.  05/14/20  Yes Oswald Hillock, MD  FLUoxetine (PROZAC) 20 MG tablet Take 40 mg by mouth at bedtime.   Yes [provider]  hydroxypropyl methylcellulose / hypromellose (ISOPTO TEARS / GONIOVISC) 2.5 % ophthalmic solution Place 1 drop into both eyes 2 (two) times daily as needed for dry eyes.    Yes  [provider]  insulin regular human CONCENTRATED (HUMULIN R U-500 KWIKPEN) 500 UNIT/ML kwikpen Inject 55-110 Units into the skin See admin instructions. Inject 110 units into the skin in the morning before breakfast and 55 units at bedtime   Yes [provider]  isosorbide mononitrate (IMDUR) 30 MG 24 hr tablet Take 1 tablet (30 mg total) by mouth daily. 11/27/19  Yes Amin, Jeanella Flattery, MD  ketoconazole (NIZORAL) 2 % cream Apply 1 application topically daily as needed for irritation (to affected areas).    Yes [provider]  nitroGLYCERIN (NITROSTAT) 0.4 MG SL tablet Place 1 tablet (0.4 mg total) under the tongue every 5 (five) minutes x 3 doses as needed for chest pain. 09/15/14  Yes Erlene Quan, PA-C  PRESCRIPTION MEDICATION See  admin instructions. CPAP- Use as directed during ANY time of rest   Yes [provider]  rosuvastatin (CRESTOR) 20 MG tablet Take 1 tablet (20 mg total) by mouth at bedtime. 11/26/19  Yes Amin, Jeanella Flattery, MD  torsemide (DEMADEX) 20 MG tablet Take 1 tablet (20 mg total) by mouth daily. 05/10/20  Yes Oswald Hillock, MD    Inpatient Medications: Scheduled Meds: . apixaban  5 mg Oral BID  . carvedilol  3.125 mg Oral BID WC  . cholestyramine  4 g Oral Q1400  . clopidogrel  75 mg Oral Daily  . FLUoxetine  40 mg Oral QHS  . furosemide  80 mg Intravenous Q12H  . insulin aspart  0-15 Units Subcutaneous TID WC  . insulin aspart  0-5 Units Subcutaneous QHS  . insulin aspart  4 Units Subcutaneous TID WC  . insulin regular human CONCENTRATED  55 Units Subcutaneous BID WC  . ipratropium-albuterol  3 mL Nebulization Q6H  . isosorbide mononitrate  30 mg Oral Daily  . rosuvastatin  20 mg Oral QHS  . sodium chloride flush  3 mL Intravenous Q12H   Continuous Infusions: . sodium chloride     PRN Meds: sodium chloride, acetaminophen, hydrALAZINE, loperamide, ondansetron (ZOFRAN) IV, sodium chloride flush  Allergies:    Allergies   Allergen Reactions  . Tape Other (See Comments)    Causes skin irritation- use sensitive tape ONLY!!    Social History:   Social History   Socioeconomic History  . Marital status: Married    Spouse name: Not on file  . Number of children: Not on file  . Years of education: Not on file  . Highest education level: Not on file  Occupational History  . Not on file  Tobacco Use  . Smoking status: Former Smoker    Packs/day: 0.50    Years: 1.00    Pack years: 0.50    Types: Cigarettes  . Smokeless tobacco: Never Used  . Tobacco comment: "quit smoking cigarettes in the 1960's"  Substance and Sexual Activity  . Alcohol use: Yes    Comment: 04/02/2015 "might have a beer and a mixed drink/month"  . Drug use: No  . Sexual activity: Yes  Other Topics Concern  . Not on file  Social History Narrative  . Not on file   Social Determinants of Health   Financial Resource Strain:   . Difficulty of Paying Living Expenses: Not on file  Food Insecurity:   . Worried About Charity fundraiser in the Last Year: Not on file  . Ran Out of Food in the Last Year: Not on file  Transportation Needs:   . Lack of Transportation (Medical): Not on file  . Lack of Transportation (Non-Medical): Not on file  Physical Activity:   . Days of Exercise per Week: Not on file  . Minutes of Exercise per Session: Not on file  Stress:   . Feeling of Stress : Not on file  Social Connections:   . Frequency of Communication with Friends and Family: Not on file  . Frequency of Social Gatherings with Friends and Family: Not on file  . Attends Religious Services: Not on file  . Active Member of Clubs or Organizations: Not on file  . Attends Archivist Meetings: Not on file  . Marital Status: Not on file  Intimate Partner Violence:   . Fear of Current or Ex-Partner: Not on file  . Emotionally Abused: Not on file  . Physically  Abused: Not on file  . Sexually Abused: Not on file    Family History:     Family History  Problem Relation Age of Onset  . Diabetes Mellitus II Mother   . Heart disease Father      ROS:  Please see the history of present illness.  All other ROS reviewed and negative.     Physical Exam/Data:   Vitals:   06/21/20 0348 06/21/20 0522 06/21/20 1157 06/21/20 1457  BP:  (!) 141/88 130/66   Pulse:  65 69   Resp:  (!) 22    Temp:  98 F (36.7 C)    TempSrc:  Oral    SpO2:  97% 98% 98%  Weight: 124.6 kg       Intake/Output Summary (Last 24 hours) at 06/21/2020 1513 Last data filed at 06/21/2020 0500 Gross per 24 hour  Intake 716 ml  Output 575 ml  Net 141 ml   Last 3 Weights 06/21/2020 06/20/2020 05/05/2020  Weight (lbs) 274 lb 11.2 oz 281 lb 12 oz 290 lb 9.1 oz  Weight (kg) 124.603 kg 127.8 kg 131.8 kg     Body mass index is 37.26 kg/m.  General: 74 y.o. male resting comfortably in no acute distress. HEENT: Normocephalic and atraumatic. Sclera clear. EOMs intact. Neck: Supple. No JVD. Heart: RRR. Distinct S1 and S2. No murmurs, gallops, or rubs. Radial and distal pedal pulses 2+ and equal bilaterally. Lungs: No increased work of breathing. Clear to ausculation bilaterally. No wheezes, rhonchi, or rales.  Abdomen: Soft, distended, and non-tender to palpation. Bowel sounds present. Extremities: 1-2+ pitting edema of bilateral lower extremities.  Skin: Warm and dry. Neuro: Alert and oriented x3. No focal deficits. Psych: Normal affect. Responds appropriately.  EKG:  The EKG was personally reviewed and demonstrates:  Ventricular paced with underlying sinus rhythm.  Telemetry:  Telemetry was personally reviewed and demonstrates:  Ventricular paced with underlying sinus rhythm. Rates in the 70's to 80's.  Relevant CV Studies:  Left Heart Catheterization 11/21/2019:  Prox RCA to Mid RCA lesion is 100% stenosed.  Prox Cx to Dist Cx lesion is 10% stenosed.  Mid LM lesion is 90% stenosed.  Ost LAD to Prox LAD lesion is 100% stenosed.  LIMA  graft was visualized by angiography and is normal in caliber.  SVG graft was not visualized.  Origin to Prox Graft lesion is 100% stenosed.  SVG graft was not visualized.  Origin to Prox Graft lesion is 100% stenosed.  SVG graft was not visualized.  Origin to Prox Graft lesion is 100% stenosed.   1. Severe left main stenosis. The left main supplies the Circumflex only as the LAD is occluded.  2. Chronic occlusion of the proximal LAD. The mid and distal LAD fills from the patent LIMA graft 3. Chronic occlusion of intermediate branch. Chronic occlusion of the SVG to intermediate branch 4. Chronic occlusion SVG to OM 5. Chronic occlusion mid RCA. The RCA fills from left to right collaterals supplied by the LAD through the LIMA. The SVG to the RCA is chronically occluded.   Recommendations: He has had progression of his left main disease. This is felt to be his culprit vessel. His left main supplies the Circumflex artery. The LAD is chronically occluded and fills from the patent LIMA graft. The RCA fills from left to right collaterals. I think he will need PCI of the left main lesion which is essentially a proximal Circumflex stenosis since the LAD is occluded and protected by  the graft. Given his CKD, this was a diagnostic only cath.  Will hydrate over the weekend. Given elevated LVEDP, will gently diurese.  Continue ASA and Plavix. Resume IV heparin 6 hours post sheath pull.  Will plan PCI/stenting of the left main artery on Monday. Will review films with the IC team. I do not think atherectomy is indicated prior to stenting.  _______________  Coronary Stent Intervention 11/24/2019: Conclusions: 1. Severe left coronary artery disease, including 90% mid/distal LMCA stenosis and chronic total occlusion of proximal LAD (supplied by patent LIMA-LAD graft). 2. Patent stents in the proximal through distal LCx. 3. Moderately elevated left-ventricular filling pressure. 4. Successful IVUS-guided  PCI to the LMCA and proximal LCx using a Synergy 3.5 x 24 mm drug-eluting stent with 0% residual stenosis and TIMI-3 flow.  Recommendations: 1. Indefinite dual antiplatelet therapy with aspirin and clopidogrel.  If apixaban is to be restarted, I would favor discontinuation of aspirin and use of apixaban and clopidogrel for at least 12 months. 2. Restart heparin infusion 2 hours after TR band removal, given history of paroxysmal atrial fibrillation with prior stroke. 3. Aggressive secondary prevention. 4. Restart diuresis; will given furosemide 80 mg IV x 1 now, with further dosing to be determined by output and renal function.  Optimize evidence-based heart failure therapy, as tolerated. _______________  Echocardiogram 06/20/2020: Impressions: 1. Left ventricular ejection fraction, by estimation, is 25 to 30%. The  left ventricle has severely decreased function. The left ventricle  demonstrates regional wall motion abnormalities (see scoring  diagram/findings for description). The left  ventricular internal cavity size was mildly dilated. Left ventricular  diastolic parameters are indeterminate.  2. Right ventricular systolic function is mildly reduced. The right  ventricular size is mildly enlarged. There is moderately elevated  pulmonary artery systolic pressure. The estimated right ventricular  systolic pressure is 25.3 mmHg.  3. Left atrial size was mildly dilated.  4. Right atrial size was moderately dilated.  5. The mitral valve is grossly normal. Moderate mitral valve  regurgitation. No evidence of mitral stenosis.  6. Tricuspid valve regurgitation is moderate to severe.  7. The aortic valve is calcified. There is mild calcification of the  aortic valve. Aortic valve regurgitation is mild. No aortic stenosis is  present.  8. Aortic dilatation noted. There is mild dilatation of the ascending  aorta, measuring 42 mm.  9. The inferior vena cava is dilated in size with  <50% respiratory  variability, suggesting right atrial pressure of 15 mmHg.   Laboratory Data:  High Sensitivity Troponin:   Recent Labs  Lab 06/09/2020 1730 06/11/2020 2033  TROPONINIHS 73* 66*     Chemistry Recent Labs  Lab 06/08/2020 1730 06/20/20 0500 06/21/20 0506  NA 143 143 141  K 3.9 3.6 3.8  CL 109 107 104  CO2 23 23 25   GLUCOSE 83 133* 227*  BUN 36* 31* 39*  CREATININE 1.66* 1.65* 1.72*  CALCIUM 9.1 8.7* 8.8*  GFRNONAA 39* 40* 38*  ANIONGAP 11 13 12     Recent Labs  Lab 06/08/2020 2033  PROT 6.3*  ALBUMIN 3.6  AST 21  ALT 13  ALKPHOS 87  BILITOT 1.0   Hematology Recent Labs  Lab 06/11/2020 1730  WBC 8.6  RBC 4.01*  HGB 13.0  HCT 40.7  MCV 101.5*  MCH 32.4  MCHC 31.9  RDW 14.3  PLT 120*   BNP Recent Labs  Lab 07/01/2020 2033  BNP 330.8*    DDimer No results for input(s): DDIMER in  the last 168 hours.   Radiology/Studies:  DG Chest 2 View  Result Date: 06/13/2020 CLINICAL DATA:  Shortness of breath EXAM: CHEST - 2 VIEW COMPARISON:  05/05/2020 FINDINGS: Cardiomegaly. Left pacing device remains in place unchanged. Prior CABG. Mild vascular congestion. Right base atelectasis. Small left pleural effusion. IMPRESSION: Cardiomegaly, vascular congestion. Small left effusion. Right base atelectasis. Electronically Signed   By: Rolm Baptise M.D.   On: 06/07/2020 17:45   ECHOCARDIOGRAM COMPLETE  Result Date: 06/20/2020    ECHOCARDIOGRAM REPORT   Patient Name:   KORREY SCHLEICHER Date of Exam: 06/20/2020 Medical Rec #:  [id number]       Height:       72.0 in Accession #:    [id number]      Weight:       281.7 lb Date of Birth:  Sep 14, 1945       BSA:          2.465 m Patient Age:    74 years        BP:           159/79 mmHg Patient Gender: M               HR:           73 bpm. Exam Location:  Inpatient Procedure: 2D Echo, Cardiac Doppler, Color Doppler and Intracardiac            Opacification Agent Indications:    I50.23 Acute on chronic systolic (congestive) heart  failure  History:        Patient has prior history of Echocardiogram examinations.                 Previous Myocardial Infarction and CAD, Defibrillator, Stroke,                 Arrythmias:Atrial Fibrillation; Risk Factors:Hypertension,                 Diabetes, Dyslipidemia, Sleep Apnea and GERD.  Sonographer:    Jonelle Sidle Dance Referring Phys: 31 EMILY B MULLEN  Sonographer Comments: Technically difficult study due to poor echo windows. IMPRESSIONS  1. Left ventricular ejection fraction, by estimation, is 25 to 30%. The left ventricle has severely decreased function. The left ventricle demonstrates regional wall motion abnormalities (see scoring diagram/findings for description). The left ventricular internal cavity size was mildly dilated. Left ventricular diastolic parameters are indeterminate.  2. Right ventricular systolic function is mildly reduced. The right ventricular size is mildly enlarged. There is moderately elevated pulmonary artery systolic pressure. The estimated right ventricular systolic pressure is 19.3 mmHg.  3. Left atrial size was mildly dilated.  4. Right atrial size was moderately dilated.  5. The mitral valve is grossly normal. Moderate mitral valve regurgitation. No evidence of mitral stenosis.  6. Tricuspid valve regurgitation is moderate to severe.  7. The aortic valve is calcified. There is mild calcification of the aortic valve. Aortic valve regurgitation is mild. No aortic stenosis is present.  8. Aortic dilatation noted. There is mild dilatation of the ascending aorta, measuring 42 mm.  9. The inferior vena cava is dilated in size with <50% respiratory variability, suggesting right atrial pressure of 15 mmHg. FINDINGS  Left Ventricle: Left ventricular ejection fraction, by estimation, is 25 to 30%. The left ventricle has severely decreased function. The left ventricle demonstrates regional wall motion abnormalities. Definity contrast agent was given IV to delineate the left  ventricular endocardial borders. The left ventricular internal cavity size was mildly  dilated. There is no left ventricular hypertrophy. Left ventricular diastolic parameters are indeterminate.  LV Wall Scoring: The mid and distal anterior septum, mid inferoseptal segment, and apical anterior segment are akinetic. Right Ventricle: The right ventricular size is mildly enlarged. No increase in right ventricular wall thickness. Right ventricular systolic function is mildly reduced. There is moderately elevated pulmonary artery systolic pressure. The tricuspid regurgitant velocity is 2.87 m/s, and with an assumed right atrial pressure of 15 mmHg, the estimated right ventricular systolic pressure is 64.4 mmHg. Left Atrium: Left atrial size was mildly dilated. Right Atrium: Right atrial size was moderately dilated. Pericardium: There is no evidence of pericardial effusion. Mitral Valve: The mitral valve is grossly normal. Moderate mitral valve regurgitation. No evidence of mitral valve stenosis. Tricuspid Valve: The tricuspid valve is normal in structure. Tricuspid valve regurgitation is moderate to severe. No evidence of tricuspid stenosis. Aortic Valve: The aortic valve is calcified. There is mild calcification of the aortic valve. Aortic valve regurgitation is mild. No aortic stenosis is present. Pulmonic Valve: The pulmonic valve was not well visualized. Pulmonic valve regurgitation is mild to moderate. No evidence of pulmonic stenosis. Aorta: Aortic dilatation noted. There is mild dilatation of the ascending aorta, measuring 42 mm. Venous: The inferior vena cava is dilated in size with less than 50% respiratory variability, suggesting right atrial pressure of 15 mmHg. IAS/Shunts: No atrial level shunt detected by color flow Doppler. Additional Comments: A pacer wire is visualized in the right atrium and right ventricle.  LEFT VENTRICLE PLAX 2D LVIDd:         5.88 cm  Diastology LVIDs:         4.58 cm  LV e' medial:     5.44 cm/s LV PW:         1.42 cm  LV E/e' medial:  16.3 LV IVS:        1.03 cm  LV e' lateral:   8.38 cm/s LVOT diam:     2.00 cm  LV E/e' lateral: 10.6 LV SV:         53 LV SV Index:   21 LVOT Area:     3.14 cm  RIGHT VENTRICLE            IVC RV Basal diam:  3.54 cm    IVC diam: 2.51 cm RV Mid diam:    2.75 cm RV S prime:     6.20 cm/s TAPSE (M-mode): 1.2 cm LEFT ATRIUM             Index       RIGHT ATRIUM           Index LA diam:        5.10 cm 2.07 cm/m  RA Area:     27.10 cm LA Vol (A2C):   95.5 ml 38.75 ml/m RA Volume:   94.30 ml  38.26 ml/m LA Vol (A4C):   67.7 ml 27.47 ml/m LA Biplane Vol: 82.3 ml 33.39 ml/m  AORTIC VALVE LVOT Vmax:   94.90 cm/s LVOT Vmean:  66.600 cm/s LVOT VTI:    0.168 m  AORTA Ao Root diam: 3.60 cm Ao Asc diam:  4.20 cm MITRAL VALVE               TRICUSPID VALVE MV Area (PHT): 2.81 cm    TR Peak grad:   32.9 mmHg MV Decel Time: 270 msec    TR Vmax:        287.00 cm/s MV E  velocity: 88.80 cm/s                            SHUNTS                            Systemic VTI:  0.17 m                            Systemic Diam: 2.00 cm Cherlynn Kaiser MD Electronically signed by Cherlynn Kaiser MD Signature Date/Time: 06/20/2020/2:26:11 PM    Final      Assessment and Plan:   Acute on Chronic Combined CHF with Acute Hypoxic Respiratory Failure 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 - BNP elevated in the 300's.  - Chest x-ray showed vascular congestion and small left pleural effusion. - Echo showed LVEF of 25-30% with akinesis of the distal anterior septum, mid inferoseptal segment and apical anterior segment (this has been seen on prior Echos). Also mildly enlarged RV with mildly reduced RV systolic function, moderately elevated PASP of 47.9 mmHg, moderate to severe TR, mild AI, and mild dilatation of the ascending aorta of 42 mm. - Started on IV Lasix which was increased to 80mg  twice daily this  afternoon. Only 575 mL of documented output in the past 24 hours. Net negative 1.1 L this admission. Weight 274 lbs today, down 7 lbs from yesterday. Weight down from 290 lbs during admission in 05/2020. Creatinine slightly up but relatively stable. - Lungs clear on exam but patient still has some lower extremity edema and abdominal bloating. - Continue IV Lasix 80mg  twice daily. - Continue Coreg 3.125mg  twice daily. - Previously on Lisinopril and Spironolactone but these were stopped due to AKI and hyperkalemia. Nephrology recommended avoiding ACEi and potassium sparing diuretics during admission in 05/2020. Do not think Entresto would be an option either then. - Will start Hydralazine 25mg  three times daily. Continue Imdur 30mg  daily. - Will add Farxiga 10mg  daily. - Continue to monitor daily weights, strict I/O's, and renal function. - May need right heart catheterization this admission.  New York Heart Association (NYHA) Functional Class NYHA Class III-IV  CAD Demand Ischemia - Long history of CAD. S/p CAD s/p CABG x4 (LIMA to LAD, SVG to [facility], SVG to OM1, and SVG to RI) in 1999 with multiple subsequent stenting (PCII x6 around 2011 at [facility], [place] to LCX in 2016, and most recently PCI/DES to left main in 11/2019). - High-sensitivity troponin minimally elevated and flat at 73 >> 66. Not consistent with ACS. Likely demand ischemia in setting of CHF.  - EKG was ventricular paced. - Echo as above. - No angina. - Continue Plavix 75mg . Not on Aspirin due to need for Eliquis. - Continue Coreg 3.125mg  twice daily, Imdur 30mg  daily, and Crestor 20mg  daily.  Paroxysmal Atrial Fibrillation - Maintaining sinus rhythm with ventricular pacing. - Continue Coreg as above. - Continue Eliquis 5mg  twice daily.  CHA2DS2-VASc Score = 8  This indicates a 10.8% annual risk of stroke. The patient's score is based upon: CHF History: 1 HTN History: 1 Diabetes History: 1 Stroke History: 2 Vascular  Disease History: 1 Age Score: 2 Gender Score: 0  Hypertension - BP mildly elevated at times but seems to be improving. - Continue Coreg and Imdur as above. - Will add Hydralazine as above.  Hyperlipidemia - Continue Crestor 20mg  daily.  Type 2 Diabetes Mellitus - On concentrated U-500 insulin at home. - Will add Farxiga 10mg  daily. - Management per primary team.  CKD Stage III - Creatinine 1.66 on admission and 1.72 today after diuresis. Baseline around 1.6 to 1.8. - Continue to monitor closely.  Deconditioning Generalized Weakness - PT/OT consulted and recommended home PT/OT.  Otherwise, primary team.    For questions or updates, please contact [place] Please consult [email or web address] for contact info under    Signed, Darreld Mclean, PA-C  06/21/2020 3:13 PM

## 2020-06-21 NOTE — Progress Notes (Signed)
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  Admitted for acute on chronic systolic CHF  Recently hospitalized September for acute kidney injury and lisinopril, spironolactone was stopped and torsemide was decreased.   A & P  * Acute on chronic systolic CHF (congestive heart failure) ([place]) --In context of recently decreased medications including torsemide, discontinuation of spironolactone.  Followed by the [facility] in [place] but it is very difficult for him to get there.  His wife provides his care.  Wants to establish with local cardiology. --fair UOP but void unrecorded. Will increase Lasix, continue daily BMP --continue Crestor, Plavix, Imdur, beta-blocker; consider Entresto and spironolactone pending cardiology input --TTE w/o sig change, LVEF 25-30% w/ regional WMA --cardiology consult today  Acute hypoxemic respiratory failure ([place]) --Secondary to acute CHF, stable, wean as tolerated.  Treating CHF.  Cardiomyopathy, ischemic --Troponin slightly elevated, but flat, secondary to demand ischemia from acute CHF.  EKG no acute changes. No further evaluation. --Continue Plavix, Crestor, carvedilol, Imdur  Type II diabetes mellitus with complication (HCC) --stable, continue SSI, start U-500 insulin back  CKD (chronic kidney disease) stage 3, GFR 30-59 ml/min (HCC) --CKD stage IIIb. Stable.  Monitor closely while on diuresis.  PAF (paroxysmal atrial fibrillation) (HCC) --paced rhythm, continue apixaban and carvedilol  OSA (obstructive sleep apnea) --continue CPAP QHS  Automatic implantable cardioverter-defibrillator in situ --Asymptomatic  Disposition Plan:  Discussion: Continue diuresis.   Cardiology consultation.  Status is: Inpatient  Remains inpatient appropriate because:IV treatments appropriate due to intensity of illness or inability to take PO   Dispo: The patient is from: Home              Anticipated d/c is to: Home              Anticipated d/c date is: 3 days              Patient currently is not medically stable to d/c.  DVT prophylaxis:  apixaban (ELIQUIS) tablet 5 mg    Code Status: DNR Family Communication: wife at bedside again 10/18.  Murray Hodgkins, MD  [facility] Direct contact: see [email or web address] (further directions at bottom of note if needed) 7PM-7AM contact night coverage as at bottom of note 06/21/2020, 3:12 PM  LOS: 2 days   Significant Hospital Events   . 10/16 admit for CHF   Consults:  .    Procedures:  .   Significant Diagnostic Tests:  Marland Kitchen    Micro Data:  .    Antimicrobials:  .   Interval History/Subjective  CC: f/u SOB  Slept very well. Breathing ok. Eating ok. Not urinating as much as usual.  Objective   Vitals:  Vitals:   06/21/20 1157 06/21/20 1457  BP: 130/66   Pulse: 69   Resp:    Temp:    SpO2: 98% 98%    Exam:  Physical Exam Vitals reviewed.  Constitutional:      General: He is not in acute distress.    Appearance: He is not ill-appearing or toxic-appearing.  Eyes:     General: No scleral icterus. Cardiovascular:     Rate and Rhythm: Normal rate and regular rhythm.     Heart sounds: No murmur heard.  No friction rub. No  gallop.   Pulmonary:     Effort: No tachypnea or respiratory distress.     Breath sounds: No wheezing, rhonchi or rales.  Abdominal:     General: There is no distension.     Palpations: Abdomen is soft.     Tenderness: There is no abdominal tenderness.     Comments: obese  Musculoskeletal:     Cervical back: No rigidity.     Right lower leg: Edema present.     Left lower leg: Edema present.  Neurological:     Mental Status: He is alert.  Psychiatric:        Mood  and Affect: Mood normal.        Behavior: Behavior normal.      I have personally reviewed the following:   Today's Data  . UOP 575+ . I/O -1.1 L . CBg stable . Creatinine stable at 1.72  Scheduled Meds: . apixaban  5 mg Oral BID  . carvedilol  3.125 mg Oral BID WC  . cholestyramine  4 g Oral Q1400  . clopidogrel  75 mg Oral Daily  . FLUoxetine  40 mg Oral QHS  . furosemide  80 mg Intravenous Q12H  . insulin aspart  0-15 Units Subcutaneous TID WC  . insulin aspart  0-5 Units Subcutaneous QHS  . insulin aspart  4 Units Subcutaneous TID WC  . insulin regular human CONCENTRATED  55 Units Subcutaneous BID WC  . ipratropium-albuterol  3 mL Nebulization Q6H  . isosorbide mononitrate  30 mg Oral Daily  . rosuvastatin  20 mg Oral QHS  . sodium chloride flush  3 mL Intravenous Q12H   Continuous Infusions: . sodium chloride      Principal Problem:   Acute on chronic systolic CHF (congestive heart failure) (HCC) Active Problems:   Acute hypoxemic respiratory failure (HCC)   Type II diabetes mellitus with complication (HCC)   Cardiomyopathy, ischemic   PAF (paroxysmal atrial fibrillation) (HCC)   CKD (chronic kidney disease) stage 3, GFR 30-59 ml/min (HCC)   Automatic implantable cardioverter-defibrillator in situ   Hyperlipidemia with target LDL less than 70   Essential hypertension   S/P CABG x 12-1997   Weakness generalized   OSA (obstructive sleep apnea)   LOS: 2 days   How to contact the [facility] Attending or Consulting provider 7A - 7P or covering provider during after hours [place], for this patient?  1. Check the care team in [facility] and look for a) attending/consulting TRH provider listed and b) the [facility] team listed 2. Log into [email or web address] and use [place] universal password to access. If you do not have the password, please contact the hospital operator. 3. Locate the [facility] provider you are looking for under [facility] and page to a number that you can be directly  reached. 4. If you still have difficulty reaching the provider, please page the [facility] (Director on Call) for the Hospitalists listed on amion for assistance.

## 2020-06-21 NOTE — Evaluation (Signed)
Physical Therapy Evaluation Patient Details Name: Vernon Briggs MRN: [id number] DOB: 08/09/1946 Today's Date: 06/21/2020   History of Present Illness  74 year old man admitted for worsening SOB and swelling (acute on chronic systolic CHF). 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  Clinical Impression  Pt seen due to above diagnosis; pt limited during session by cognition requiring increased time to process questions and commands; pt nervous about standing due to increased time spent in bed, pt motivated due to need to use bathroom during session; pt requiring mod A initially, but progressed to min A with use of RW during functional mobility tasks; pt educated on deficits in balance, strength, coordination, gait and endurance and verbalized understanding; Pt will benefit from skilled PT to address deficits to maximize independence with functional mobility prior to discharge.     Follow Up Recommendations Home health PT    Equipment Recommendations  None recommended by PT    Recommendations for Other Services       Precautions / Restrictions Precautions Precautions: Fall Restrictions Weight Bearing Restrictions: No      Mobility  Bed Mobility Overal bed mobility: Needs Assistance Bed Mobility: Sit to Supine       Sit to supine: Min assist   General bed mobility comments: use of bedrails  Transfers Overall transfer level: Needs assistance Equipment used: Rolling walker (2 wheeled) Transfers: Sit to/from [facility] Sit to Stand: Min assist;Mod assist Stand pivot transfers: Min assist       General transfer comment: initial sit>stand mod A; wtih continued trials min A; pt able to use grab bars to perform si>stand from toilet  Ambulation/Gait Ambulation/Gait assistance: Min guard Gait Distance (Feet): 15 Feet Assistive device: Rolling walker (2 wheeled)        General Gait Details: distance x 2 to the bathroom; increased weight bearing through B UEs; decreased velocity  Stairs            Wheelchair Mobility    Modified Rankin (Stroke Patients Only)       Balance Overall balance assessment: Needs assistance Sitting-balance support: Feet supported;No upper extremity supported Sitting balance-Leahy Scale: Good     Standing balance support: Bilateral upper extremity supported Standing balance-Leahy Scale: Poor                               Pertinent Vitals/Pain Pain Assessment: No/denies pain    Home Living Family/patient expects to be discharged to:: Private residence Living Arrangements: Spouse/significant other Available Help at Discharge: Family Type of Home: House Home Access: Stairs to enter;Ramped entrance Entrance Stairs-Rails: Left Entrance Stairs-Number of Steps: 4 Home Layout: One level Home Equipment: Youth worker - 2 wheels;Wheelchair - manual;Cane - quad;Grab bars - tub/shower;Shower seat;Hand held shower head;Shower seat - built in Additional Comments: pt has been having HHPT and has improved with ambulation    Prior Function Level of Independence: Independent         Comments: spouse assists as needed for safety and to assist with household needs     Hand Dominance   Dominant Hand: Left    Extremity/Trunk Assessment   Upper Extremity Assessment Upper Extremity Assessment: Defer to OT evaluation    Lower Extremity Assessment Lower Extremity Assessment: Generalized weakness    Cervical / Trunk Assessment Cervical / Trunk Assessment: Kyphotic  Communication   Communication: No difficulties  Cognition Arousal/Alertness:  Awake/alert Behavior During Therapy: WFL for tasks assessed/performed Overall Cognitive Status: Impaired/Different from baseline Area of Impairment: Orientation                 Orientation Level: Person;Place;Situation              General Comments: pt oriented to person, place and situation, pt with difficulty wtih time; pt just woken up from deep sleep stating he felt very out of it      General Comments      Exercises     Assessment/Plan    PT Assessment Patient needs continued PT services  PT Problem List Decreased strength;Decreased mobility;Decreased safety awareness;Decreased coordination;Decreased activity tolerance;Decreased balance       PT Treatment Interventions DME instruction;Therapeutic exercise;Gait training;Balance training;Functional mobility training;Therapeutic activities    PT Goals (Current goals can be found in the Care Plan section)  Acute Rehab PT Goals Patient Stated Goal: I want to go home PT Goal Formulation: With patient/family Time For Goal Achievement: 07/05/20 Potential to Achieve Goals: Good    Frequency Min 3X/week   Barriers to discharge        Co-evaluation               AM-PAC PT "6 Clicks" Mobility  Outcome Measure Help needed turning from your back to your side while in a flat bed without using bedrails?: A Little Help needed moving from lying on your back to sitting on the side of a flat bed without using bedrails?: A Little Help needed moving to and from a bed to a chair (including a wheelchair)?: A Little Help needed standing up from a chair using your arms (e.g., wheelchair or bedside chair)?: A Little Help needed to walk in hospital room?: A Little Help needed climbing 3-5 steps with a railing? : A Lot 6 Click Score: 17    End of Session Equipment Utilized During Treatment: Gait belt;Oxygen Activity Tolerance: Patient tolerated treatment well Patient left: in bed;with family/visitor present;with call bell/phone within reach;with bed alarm set Nurse Communication: Mobility status PT Visit Diagnosis: Unsteadiness on feet (R26.81);Other abnormalities of gait and mobility (R26.89);Muscle weakness (generalized) (M62.81);History of falling (Z91.81)     Time: 1017-1050 PT Time Calculation (min) (ACUTE ONLY): 33 min   Charges:   PT Evaluation $PT Eval Low Complexity: 1 Low $PT Eval Moderate Complexity: 1 Mod PT Treatments $Therapeutic Activity: 8-22 mins        Lyanne Co, DPT Acute Rehabilitation Services [phone]  [place] 06/21/2020, 12:04 PM

## 2020-06-21 NOTE — Progress Notes (Signed)
Gave nebulizer treatment a little early D/T patient wheezing. May need to consider PRN nebs as well. Thanks
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
## 2020-06-21 NOTE — Progress Notes (Signed)
Heart Failure Stewardship Pharmacist Progress Note   PCP: [name], MD PCP-Cardiologist: Fransico Him, MD    HPI:  74 yo M with PMH of CHF, CAD, type 2 diabetes, afib, and prior stroke. He presented to the ED on 06/08/2020 with acute on chronic CHF. ECHO done on 06/20/20 and LVEF is 25-30% (stable from prior ECHO in March 2021).  Current HF Medications: Furosemide 60 mg IV q12h Carvedilol 3.125 mg BID Imdur 30 mg daily  Prior to admission HF Medications: Torsemide 20 mg daily Carvedilol 3.125 mg BID Imdur 30 mg daily  Pertinent Lab Values: . Serum creatinine 1.72, BUN 39, Potassium 3.8, Sodium 141, BNP 330.8  Vital Signs: . Weight: 274 lbs (admission weight: 281 lbs) . Blood pressure: 140/80s  . Heart rate: 60s  Medication Assistance / Insurance Benefits Check: Does the patient have prescription insurance?  Yes Type of insurance plan: [place] Medicare; [place]  Outpatient Pharmacy:  [facility] Is the patient willing to use [place] at discharge? Yes Is the patient willing to transition their outpatient pharmacy to utilize a [facility] outpatient pharmacy?   Pending    Assessment: 1. Acute on chronic systolic CHF (EF 16-10%), due to ischemic cardiomyopathy. NYHA class II symptoms. - Continue furosemide 60 mg IV q12h - Continue carvedilol 3.125 mg BID - No ACE/ARB/ARNI (was previously on lisinopril but this was stopped due to AKI). Consider starting losartan vs Entresto with SCr relatively stable - Consider starting spironolactone if SCr remains stable - Continue Imdur 30 mg daily - Could add hydralazine if renal function does not allow other GDMT   Plan: 1) Medication changes recommended at this time: - Add losartan or Entresto  2) Patient assistance application(s): - None pending  3)  Education  - To be completed prior to discharge  Kerby Nora, PharmD, BCPS Heart Failure Stewardship Pharmacist Phone [phone]

## 2020-06-22 DIAGNOSIS — G4733 Obstructive sleep apnea (adult) (pediatric): Secondary | ICD-10-CM

## 2020-06-22 DIAGNOSIS — E118 Type 2 diabetes mellitus with unspecified complications: Secondary | ICD-10-CM | POA: Diagnosis not present

## 2020-06-22 DIAGNOSIS — I10 Essential (primary) hypertension: Secondary | ICD-10-CM | POA: Diagnosis not present

## 2020-06-22 DIAGNOSIS — J9601 Acute respiratory failure with hypoxia: Secondary | ICD-10-CM | POA: Diagnosis not present

## 2020-06-22 DIAGNOSIS — N1832 Chronic kidney disease, stage 3b: Secondary | ICD-10-CM | POA: Diagnosis not present

## 2020-06-22 DIAGNOSIS — I5023 Acute on chronic systolic (congestive) heart failure: Secondary | ICD-10-CM | POA: Diagnosis not present

## 2020-06-22 LAB — BASIC METABOLIC PANEL
Anion gap: 13 (ref 5–15)
BUN: 38 mg/dL — ABNORMAL HIGH (ref 8–23)
CO2: 25 mmol/L (ref 22–32)
Calcium: 8.9 mg/dL (ref 8.9–10.3)
Chloride: 101 mmol/L (ref 98–111)
Creatinine, Ser: 1.65 mg/dL — ABNORMAL HIGH (ref 0.61–1.24)
GFR, Estimated: 40 mL/min — ABNORMAL LOW (ref >60)
Glucose, Bld: 210 mg/dL — ABNORMAL HIGH (ref 70–99)
Potassium: 3.6 mmol/L (ref 3.5–5.1)
Sodium: 139 mmol/L (ref 135–145)

## 2020-06-22 LAB — GLUCOSE, CAPILLARY
Glucose-Capillary: 201 mg/dL — ABNORMAL HIGH (ref 70–99)
Glucose-Capillary: 228 mg/dL — ABNORMAL HIGH (ref 70–99)
Glucose-Capillary: 158 mg/dL — ABNORMAL HIGH (ref 70–99)
Glucose-Capillary: 71 mg/dL (ref 70–99)

## 2020-06-22 MED ORDER — FUROSEMIDE 10 MG/ML IJ SOLN
80.0000 mg | Freq: Three times a day (TID) | INTRAMUSCULAR | Status: AC
  Administered 2020-06-23 (×3): 80 mg via INTRAVENOUS
  Filled 2020-06-22 (×2): qty 8

## 2020-06-22 MED ORDER — FUROSEMIDE 10 MG/ML IJ SOLN
80.0000 mg | Freq: Three times a day (TID) | INTRAMUSCULAR | Status: DC
  Administered 2020-06-22: 80 mg via INTRAVENOUS
  Filled 2020-06-22 (×2): qty 8

## 2020-06-22 MED ORDER — IPRATROPIUM-ALBUTEROL 0.5-2.5 (3) MG/3ML IN SOLN
3.0000 mL | Freq: Three times a day (TID) | RESPIRATORY_TRACT | Status: DC
  Filled 2020-06-22: qty 3

## 2020-06-22 MED ORDER — IPRATROPIUM-ALBUTEROL 0.5-2.5 (3) MG/3ML IN SOLN
3.0000 mL | Freq: Three times a day (TID) | RESPIRATORY_TRACT | Status: DC | PRN
  Administered 2020-06-24: 3 mL via RESPIRATORY_TRACT
  Filled 2020-06-22: qty 3

## 2020-06-22 NOTE — Progress Notes (Signed)
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  Admitted for acute on chronic systolic CHF  Recently hospitalized September for acute kidney injury and lisinopril, spironolactone was stopped and torsemide was decreased.   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  Followed by the [facility] in [place] but it is very difficult for him to get there.  His wife provides his care.  Wants to establish with local cardiology. --improved diuresis on higher dose of Lasix, per cardiology increased to TID today --will continue Crestor, Plavix, Imdur, beta-blocker, Farxiga, hydralazine; based on last admit will not add ACE-I or spironolactone --TTE w/o sig change, LVEF 25-30% w/ regional [facility] --cardiology consult today  Acute hypoxemic respiratory failure ([place]) --Secondary to acute CHF, stable, wean as tolerated.  Treating CHF.  Cardiomyopathy, ischemic --Troponin slightly elevated, but flat, secondary to demand ischemia from acute CHF.  EKG no acute changes. No further evaluation. --Continue Plavix, Crestor, carvedilol, Imdur  Type II diabetes mellitus with complication (HCC) --overall stable, will continue SSI, continue U-500 insulin, can go up on AM dose if needed back to 110 units in the morning  CKD (chronic kidney disease) stage 3, GFR 30-59 ml/min (HCC) --CKD stage IIIb. Remains stable.  Will monitor closely while on diuresis.   PAF (paroxysmal atrial fibrillation) (HCC) --paced rhythm, continue apixaban and carvedilol  OSA  (obstructive sleep apnea) --continue CPAP QHS  Automatic implantable cardioverter-defibrillator in situ --Asymptomatic  Disposition Plan:  Discussion: Continue diuresis.  Cardiology consultation.  Status is: Inpatient  Remains inpatient appropriate because:IV treatments appropriate due to intensity of illness or inability to take PO   Dispo: The patient is from: Home              Anticipated d/c is to: Home w/ HHPT              Anticipated d/c date is: 3 days              Patient currently is not medically stable to d/c.  DVT prophylaxis:  apixaban (ELIQUIS) tablet 5 mg    Code Status: DNR Family Communication: wife at bedside again 10/19  Murray Hodgkins, MD  [facility] Direct contact: see [email or web address] (further directions at bottom of note if needed) 7PM-7AM contact night coverage as at bottom of note 06/22/2020, 4:52 PM  LOS: 3 days   Significant Hospital Events   . 10/16 admit for CHF   Consults:  .    Procedures:  .   Significant Diagnostic Tests:  Marland Kitchen    Micro Data:  .    Antimicrobials:  .   Interval History/Subjective  CC: f/u SOB  Feels poorly today, short of breath. Tolerating diet.  Objective   Vitals:  Vitals:   06/22/20 1421 06/22/20 1421  BP: 139/80 139/80  Pulse:  74  Resp:    Temp:    SpO2:  100%    Exam:  Physical Exam Vitals reviewed.  Constitutional:      General: He is not in acute distress.    Appearance: He is not ill-appearing or toxic-appearing.  Comments: Sitting on side of bed, very awake  Eyes:     General: No scleral icterus. Cardiovascular:     Rate and Rhythm: Normal rate and regular rhythm.     Heart sounds: No murmur heard.  No friction rub. No gallop.      Comments: Telemetry shows paced rhythm Pulmonary:     Effort: No tachypnea or respiratory distress.     Breath sounds: No wheezing, rhonchi or rales.  Abdominal:     General: There is no distension.     Comments: obese  Musculoskeletal:      Cervical back: No rigidity.     Right lower leg: Edema present.     Left lower leg: Edema present.     Comments: Perhaps slight improvement in LE edema.  Neurological:     Mental Status: He is alert.  Psychiatric:        Mood and Affect: Mood normal.        Behavior: Behavior normal.      I have personally reviewed the following:   Today's Data  . UOP 1400+ . I/O -1.7 L . CBG over stable . Creatinine stable at 1.65  Scheduled Meds: . apixaban  5 mg Oral BID  . carvedilol  3.125 mg Oral BID WC  . cholestyramine  4 g Oral Q1400  . clopidogrel  75 mg Oral Daily  . dapagliflozin propanediol  10 mg Oral Daily  . FLUoxetine  40 mg Oral QHS  . furosemide  80 mg Intravenous Q12H  . hydrALAZINE  25 mg Oral Q8H  . insulin aspart  0-15 Units Subcutaneous TID WC  . insulin aspart  0-5 Units Subcutaneous QHS  . insulin regular human CONCENTRATED  55 Units Subcutaneous BID WC  . isosorbide mononitrate  30 mg Oral Daily  . rosuvastatin  20 mg Oral QHS  . sodium chloride flush  3 mL Intravenous Q12H   Continuous Infusions: . sodium chloride      Principal Problem:   Acute on chronic systolic CHF (congestive heart failure) (HCC) Active Problems:   Acute hypoxemic respiratory failure (HCC)   Type II diabetes mellitus with complication (HCC)   Cardiomyopathy, ischemic   PAF (paroxysmal atrial fibrillation) (HCC)   CKD (chronic kidney disease) stage 3, GFR 30-59 ml/min (HCC)   Automatic implantable cardioverter-defibrillator in situ   Hyperlipidemia with target LDL less than 70   Essential hypertension   S/P CABG x 12-1997   Weakness generalized   OSA (obstructive sleep apnea)   LOS: 3 days   How to contact the [facility] Attending or Consulting provider 7A - 7P or covering provider during after hours [place], for this patient?  1. Check the care team in [facility] and look for a) attending/consulting TRH provider listed and b) the [facility] team listed 2. Log into [email or web address] and use [facility]'s universal password to access. If you do not have the password, please contact the hospital operator. 3. Locate the [facility] provider you are looking for under [facility] and page to a number that you can be directly reached. 4. If you still have difficulty reaching the provider, please page the [facility] (Director on Call) for the Hospitalists listed on amion for assistance.

## 2020-06-22 NOTE — Progress Notes (Signed)
Heart Failure Stewardship Pharmacist Progress Note   PCP: [name], MD PCP-Cardiologist: Fransico Him, MD    HPI:  74 yo M with PMH of CHF, CAD, type 2 diabetes, afib, and prior stroke. He presented to the ED on 06/20/2020 with acute on chronic CHF. ECHO done on 06/20/20 and LVEF is 25-30% (stable from prior ECHO in March 2021).  Current HF Medications: Furosemide 80 mg IV q12h Carvedilol 3.125 mg BID Farxiga 10 mg daily Imdur 30 mg daily Hydralazine 25 mg q8h  Prior to admission HF Medications: Torsemide 20 mg daily Carvedilol 3.125 mg BID Imdur 30 mg daily  Pertinent Lab Values: . Serum creatinine 1.65, BUN 38, Potassium 3.6, Sodium 139, BNP 330.8  Vital Signs: . Weight: 274 lbs (admission weight: 281 lbs) . Blood pressure: 140-150/70-90s  . Heart rate: 70s  Medication Assistance / Insurance Benefits Check: Does the patient have prescription insurance?  Yes Type of insurance plan: [place] Medicare; [place]  Outpatient Pharmacy:  [facility] Is the patient willing to use [place] at discharge? Yes Is the patient willing to transition their outpatient pharmacy to utilize a [facility] outpatient pharmacy?   Pending    Assessment: 1. Acute on chronic systolic CHF (EF 57-32%), due to ischemic cardiomyopathy. NYHA class II symptoms. - Continue furosemide 80 mg IV q12h - Continue carvedilol 3.125 mg BID - No ACE/ARB/ARNI (was previously on lisinopril but this was stopped due to AKI). Consider starting Entresto with SCr relatively stable - Consider starting spironolactone if SCr remains stable - Continue Farxiga 10 mg daily - Continue Imdur 30 mg daily - Continue hydralazine 25 mg q8h   Plan: 1) Medication changes recommended at this time: - If BP remains elevated and SCr stable in AM, consider adding Entresto  2) Patient assistance application(s): - None pending  3)  Education  - To be completed prior to  discharge  Kerby Nora, PharmD, BCPS Heart Failure Stewardship Pharmacist Phone [phone]

## 2020-06-22 NOTE — Plan of Care (Signed)
  Problem: Activity: Goal: Risk for activity intolerance will decrease Outcome: Progressing   Problem: Nutrition: Goal: Adequate nutrition will be maintained Outcome: Progressing   Problem: Coping: Goal: Level of anxiety will decrease Outcome: Progressing   

## 2020-06-22 NOTE — Progress Notes (Signed)
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  Admitted for acute on chronic systolic CHF    PT Comments    Pt fully participated in session with rest breaks needed; pt with decreased tolerance for standing compared to previous day; pt requiring seated rest breaks and increased cueing for breathing; pt continues to demonstrate deficits in balance, strength, endurance, gait and safety with RW; pt will benefit from skilled PT to address deficits and maximize independence with functional mobility prior to discharge.     Follow Up Recommendations  Home health PT     Equipment Recommendations  None recommended by PT    Recommendations for Other Services       Precautions / Restrictions Precautions Precautions: Fall Precaution Comments: chronic L knee pain/instability in WB position, h/o falls at home Restrictions Weight Bearing Restrictions: No    Mobility  Bed Mobility Overal bed mobility: Needs Assistance Bed Mobility: Supine to Sit;Sit to Supine     Supine to sit: Mod assist;HOB elevated Sit to supine: Supervision;HOB elevated   General bed mobility comments: use of bedrails  Transfers Overall transfer level: Needs assistance Equipment used: Rolling walker (2 wheeled) Transfers: Sit to/from Stand Sit to Stand: Min assist;Mod assist         General transfer comment: performed sit<>stand x 5 trials requiring mod A for 4 and 5; pt requiring cueing for hand placement for safety on RW; pt performed sit stepping to move up to the [facility] to improve postiioning/alignment in bed  Ambulation/Gait                 Stairs             Wheelchair Mobility     Modified Rankin (Stroke Patients Only)       Balance                                            Cognition Arousal/Alertness: Awake/alert Behavior During Therapy: [facility] for tasks assessed/performed                                          Exercises General Exercises - Lower Extremity Ankle Circles/Pumps: AROM;Both;15 reps;Seated Long Arc Quad: AROM;Both;10 reps;Seated Hip Flexion/Marching: AROM;Both;10 reps;Seated    General Comments General comments (skin integrity, edema, etc.): pt SOB during session on 3 L O2, O2 sats stable 98 or greater; pt given manual and verbal cueing for scapular retraction and to elevate head to improve breathing      Pertinent Vitals/Pain Pain Assessment: No/denies pain    Home Living                      Prior Function            PT Goals (current goals can now be found in the care plan section) Acute Rehab PT Goals Patient Stated Goal: I want to go home PT Goal Formulation: With patient/family Time For Goal Achievement: 07/05/20 Potential to Achieve Goals: Good Progress towards PT goals:  Progressing toward goals    Frequency    Min 3X/week      PT Plan Current plan remains appropriate    Co-evaluation              AM-PAC PT "6 Clicks" Mobility   Outcome Measure  Help needed turning from your back to your side while in a flat bed without using bedrails?: A Little Help needed moving from lying on your back to sitting on the side of a flat bed without using bedrails?: A Lot Help needed moving to and from a bed to a chair (including a wheelchair)?: A Little Help needed standing up from a chair using your arms (e.g., wheelchair or bedside chair)?: A Lot Help needed to walk in hospital room?: A Little Help needed climbing 3-5 steps with a railing? : A Lot 6 Click Score: 15    End of Session Equipment Utilized During Treatment: Gait belt;Oxygen Activity Tolerance: Patient  tolerated treatment well;Patient limited by fatigue Patient left: in bed;with family/visitor present;with call bell/phone within reach;with bed alarm set Nurse Communication: Mobility status PT Visit Diagnosis: Unsteadiness on feet (R26.81);Other abnormalities of gait and mobility (R26.89);Muscle weakness (generalized) (M62.81);History of falling (Z91.81)     Time: 8022-3361 PT Time Calculation (min) (ACUTE ONLY): 27 min  Charges:  $Therapeutic Exercise: 23-37 mins                     Lyanne Co, DPT Acute Rehabilitation Services [phone]   [place] 06/22/2020, 12:29 PM

## 2020-06-22 NOTE — Progress Notes (Signed)
Inpatient Diabetes Program Recommendations  AACE/ADA: New Consensus Statement on Inpatient Glycemic Control (2015)  Target Ranges:  Prepandial:   less than 140 mg/dL      Peak postprandial:   less than 180 mg/dL (1-2 hours)      Critically ill patients:  140 - 180 mg/dL   Lab Results  Component Value Date   GLUCAP 228 (H) 06/22/2020   HGBA1C 7.0 (H) 05/06/2020    Review of Glycemic Control Results for Vernon Briggs, Vernon Briggs (MRN [id number]) as of 06/22/2020 14:27  Ref. Range 06/21/2020 12:15 06/21/2020 16:15 06/21/2020 21:56 06/22/2020 06:21 06/22/2020 11:52  Glucose-Capillary Latest Ref Range: 70 - 99 mg/dL 273 (H) 314 (H) 253 (H) 201 (H) 228 (H)   Diabetes history:  DM2 Outpatient Diabetes medications:  U500 110 units qam & 55 units qpm Current orders for Inpatient glycemic control:  U500 55 units bid Novolog 0-15 units tid & 0-5 units qhs  Inpatient Diabetes Program Recommendations:     Might consider increasing U-500 to 60 units bid  Will continue to follow while inpatient.  Thank you, Reche Dixon, RN, BSN Diabetes Coordinator Inpatient Diabetes Program [phone] (team pager from 8a-5p)

## 2020-06-22 NOTE — TOC Initial Note (Signed)
Transition of Care [facility]) - Initial/Assessment Note    Patient Details  Name: Vernon Briggs MRN: [id number] Date of Birth: 1946-06-06  Transition of [facility]) CM/SW Contact:    Zenon Mayo, RN Phone Number: 06/22/2020, 4:19 PM  Clinical Narrative:                 Patient from home with wife, he has a ramp, walk in shower with grab bars, cpap, cane, motorized scooter, bsc, he also goes to [place] in [place], Dr there is [place].  Wife states he is active with [facility] for [place], [place], [place], wiould like ito continue with [facility].  NCM made [facility] aware . Will need resumption orders for HHRN, [place], HHOT.   Expected Discharge Plan: [place] Barriers to Discharge: Continued Medical Work up   Patient Goals and CMS Choice Patient states their goals for this hospitalization and ongoing recovery are:: get a lot better CMS Medicare.gov Compare Post Acute Care list provided to:: Patient Represenative (must comment) Choice offered to / list presented to : Adult Children  Expected Discharge Plan and Services Expected Discharge Plan: [place] In-house Referral: NA Discharge Planning Services: CM Consult Post Acute Care Choice: Home Health, Resumption of Svcs/PTA Provider Living arrangements for the past 2 months: Single Family Home                   DME Agency: NA       HH Arranged: RN, PT, OT, Disease Management, Social Work [employer] Agency: [facility] [employer] [facility] [employer] Date [place]: 06/22/20 Time [place]: 1618 Representative spoke with at [place]: [place] Arrangements/Services Living arrangements for the past 2 months: [place] Lives with:: Spouse Patient language and need for interpreter reviewed:: Yes Do you feel safe going back to the place where you live?: Yes      Need for Family Participation in Patient Care: Yes (Comment) Care giver support system in place?: Yes (comment) Current  home services: DME (has walker, cane, motorized scooter, bsc, ramp, walkin shower with grab bars , cpap) Criminal Activity/Legal Involvement Pertinent to Current Situation/Hospitalization: No - Comment as needed  Activities of Daily Living      Permission Sought/Granted                  Emotional Assessment Appearance:: Appears stated age Attitude/Demeanor/Rapport: Engaged Affect (typically observed): Appropriate Orientation: : Oriented to Self, Oriented to Place, Oriented to  Time, Oriented to Situation Alcohol / Substance Use: Not Applicable Psych Involvement: No (comment)  Admission diagnosis:  Acute systolic (congestive) heart failure (HCC) [I50.21] Acute on chronic congestive heart failure, unspecified heart failure type [facility]) [I50.9] Patient Active Problem List   Diagnosis Date Noted  . OSA (obstructive sleep apnea) 06/20/2020  . Acute hypoxemic respiratory failure ([place]) 06/20/2020  . Acute on chronic systolic CHF (congestive heart failure) ([place]) 07/01/2020  . Benign neoplasm of ascending colon   . Benign neoplasm of transverse colon   . Benign neoplasm of descending colon   . Benign neoplasm of rectum   . Diarrhea   . Volume depletion   . Palliative care by specialist   . Goals of care, counseling/discussion   . Advanced directives, counseling/discussion   . Generalized weakness   . AKI (acute kidney injury) ([place]) 05/05/2020  . Hyperkalemia 05/05/2020  . CKD (chronic kidney disease) stage 3, GFR 30-59 ml/min (HCC) 05/05/2020  . Acute systolic heart  failure ([place])   . CKD (chronic kidney disease) stage 2, GFR 60-89 ml/min 11/20/2019  . NSTEMI (non-ST elevated myocardial infarction) ([place]) 11/20/2019  . Obesity 11/20/2019  . Unstable angina ([place])   . Chest pain 04/02/2015  . Acute renal failure ([place]) 04/02/2015  . Weakness generalized 04/02/2015  . Cardiomyopathy, ischemic 09/15/2014  . CAD S/P CFX DES 09/14/14 09/15/2014  . PAF (paroxysmal atrial fibrillation)  ([place]) 09/15/2014  . Chronic anticoagulation-Coumadin 09/15/2014  . S/P CABG x 12-1997   . Elevated troponin   . Abnormal nuclear stress test 09/11/2014    Class: Diagnosis of  . Acute coronary syndrome ([place]) 09/09/2014  . Hypertensive urgency 09/09/2014  . Essential hypertension   . History of CVA (cerebrovascular accident) 10/20/2012  . TIA (transient ischemic attack) 10/20/2012  . Automatic implantable cardioverter-defibrillator in situ 10/20/2012  . Type II diabetes mellitus with complication ([place]) 67/59/1638  . Hyperlipidemia with target LDL less than 70 10/20/2012  . Thrombocytopenia-plts 90-70K 10/20/2012   PCP:  [name], MD Pharmacy:   [id number] - [place], [place] [address] [place] [address] Phone: [phone] Fax: [place], [place] [address] Phone: [phone] Fax: [phone]     Social Determinants of Health (SDOH) Interventions Food Insecurity Interventions: Intervention Not Indicated  Readmission Risk Interventions Readmission Risk Prevention Plan 06/22/2020  Transportation Screening Complete  HRI or Home Care Consult Complete  Social Work Consult for [place] Planning/Counseling Complete  Palliative Care Screening Not Applicable  Medication Review Press photographer) Complete  Some recent data might be hidden

## 2020-06-22 NOTE — Progress Notes (Signed)
Progress Note  Patient Name: Vernon Briggs Date of Encounter: 06/22/2020  [facility] Cardiologist: Fransico Him, MD   Subjective   He feels the same as yesterday.  Still with moderate dyspnea.  Sitting at bedside.  Wife is here with him today.  We had conversation.  She is very nice.  He is not having chest pain.  Notes the swelling is better than yesterday.  Inpatient Medications    Scheduled Meds: . apixaban  5 mg Oral BID  . carvedilol  3.125 mg Oral BID WC  . cholestyramine  4 g Oral Q1400  . clopidogrel  75 mg Oral Daily  . dapagliflozin propanediol  10 mg Oral Daily  . FLUoxetine  40 mg Oral QHS  . furosemide  80 mg Intravenous Q12H  . hydrALAZINE  25 mg Oral Q8H  . insulin aspart  0-15 Units Subcutaneous TID WC  . insulin aspart  0-5 Units Subcutaneous QHS  . insulin regular human CONCENTRATED  55 Units Subcutaneous BID WC  . isosorbide mononitrate  30 mg Oral Daily  . rosuvastatin  20 mg Oral QHS  . sodium chloride flush  3 mL Intravenous Q12H   Continuous Infusions: . sodium chloride     PRN Meds: sodium chloride, acetaminophen, ipratropium-albuterol, loperamide, ondansetron (ZOFRAN) IV, sodium chloride flush   Vital Signs    Vitals:   06/22/20 0759 06/22/20 0839 06/22/20 1135 06/22/20 1150  BP: (!) 139/97 (!) 150/77 136/72 129/65  Pulse: 81 78 75 73  Resp: 18 20 18 18   Temp: 98.5 F (36.9 C)   98 F (36.7 C)  TempSrc:      SpO2: 97% 100% 100% 100%  Weight:        Intake/Output Summary (Last 24 hours) at 06/22/2020 1332 Last data filed at 06/22/2020 1200 Gross per 24 hour  Intake 1090 ml  Output 3075 ml  Net -1985 ml   Last 3 Weights 06/21/2020 06/20/2020 05/05/2020  Weight (lbs) 274 lb 11.2 oz 281 lb 12 oz 290 lb 9.1 oz  Weight (kg) 124.603 kg 127.8 kg 131.8 kg      Telemetry    Ventricular pacing- Personally Reviewed  ECG    A new tracing is not performed- Personally Reviewed  Physical Exam  Marked obesity, particularly  abdominal GEN: No acute distress.   Neck: No JVD Cardiac: RRR, no murmurs, rubs, or gallops.  Respiratory: Clear to auscultation bilaterally. GI: Soft, nontender, non-distended  MS: No edema; No deformity. Neuro:  Nonfocal  Psych: Normal affect   Labs    High Sensitivity Troponin:   Recent Labs  Lab 06/16/2020 1730 06/22/2020 2033  TROPONINIHS 73* 66*      Chemistry Recent Labs  Lab 06/30/2020 1730 06/16/2020 2033 06/20/20 0500 06/21/20 0506 06/22/20 0204  NA   < >  --  143 141 139  K   < >  --  3.6 3.8 3.6  CL   < >  --  107 104 101  CO2   < >  --  23 25 25   GLUCOSE   < >  --  133* 227* 210*  BUN   < >  --  31* 39* 38*  CREATININE   < >  --  1.65* 1.72* 1.65*  CALCIUM   < >  --  8.7* 8.8* 8.9  PROT  --  6.3*  --   --   --   ALBUMIN  --  3.6  --   --   --  AST  --  21  --   --   --   ALT  --  13  --   --   --   ALKPHOS  --  87  --   --   --   BILITOT  --  1.0  --   --   --   GFRNONAA   < >  --  40* 38* 40*  ANIONGAP   < >  --  13 12 13    < > = values in this interval not displayed.     Hematology Recent Labs  Lab 06/17/2020 1730  WBC 8.6  RBC 4.01*  HGB 13.0  HCT 40.7  MCV 101.5*  MCH 32.4  MCHC 31.9  RDW 14.3  PLT 120*    BNP Recent Labs  Lab 06/23/2020 2033  BNP 330.8*     DDimer No results for input(s): DDIMER in the last 168 hours.   Radiology    No results found.  Cardiac Studies   2D Doppler echocardiogram 06/20/2020: IMPRESSIONS    1. Left ventricular ejection fraction, by estimation, is 25 to 30%. The  left ventricle has severely decreased function. The left ventricle  demonstrates regional wall motion abnormalities (see scoring  diagram/findings for description). The left  ventricular internal cavity size was mildly dilated. Left ventricular  diastolic parameters are indeterminate.  2. Right ventricular systolic function is mildly reduced. The right  ventricular size is mildly enlarged. There is moderately elevated  pulmonary  artery systolic pressure. The estimated right ventricular  systolic pressure is 09.3 mmHg.  3. Left atrial size was mildly dilated.  4. Right atrial size was moderately dilated.  5. The mitral valve is grossly normal. Moderate mitral valve  regurgitation. No evidence of mitral stenosis.  6. Tricuspid valve regurgitation is moderate to severe.  7. The aortic valve is calcified. There is mild calcification of the  aortic valve. Aortic valve regurgitation is mild. No aortic stenosis is  present.  8. Aortic dilatation noted. There is mild dilatation of the ascending  aorta, measuring 42 mm.  9. The inferior vena cava is dilated in size with <50% respiratory  variability, suggesting right atrial pressure of 15 mmHg.   Patient Profile     74 y.o. male CAD s/p CABG x4 (LIMA to LAD, SVG to [facility], SVG to OM1, and SVG to RI) in 1999 with multiple subsequent stenting (PCI x6 around [facility] at [facility], [place] to LCX in 2016, and most recently PCI/DES to left main in 11/2019), chronic combined CHF/ischemic cardiomyopathy with EF of 25-30% on Echo in 11/2018, s/p Biotronik ICD in 2013, paroxsymal atrial fibrillation on Eliquis, prior left frontal CVA, hypertension, hyperlipidemia, type 2 diabetes mellitus, CKD stage III, obstructive sleep apnea on CPAP, anxiety/depression, and PTSD who is admitted with volume overload.  Assessment & Plan    1. Chronic combined systolic and diastolic heart failure: Responding to higher dose IV Lasix.  Watch kidney function.  Will increase furosemide to every 8 hours for 3 doses depending upon kidney function. 2. 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 Stage IIIb CKD: Monitor kidney function very closely to avoid acute component.  He had acute on chronic kidney disease last admission resulting in discontinuation of spironolactone and ACE inhibitor therapy. 4. Morbid obesity: This is a significant  comorbidity 5. Obstructive sleep apnea: Should wear CPAP in hospital. 6. Primary hypertension: Still have room for diuresis.  Can optimize  hydralazine once volume neutral.     For questions or updates, please contact [place] Please consult [email or web address] for contact info under        Signed, Sinclair Grooms, MD  06/22/2020, 1:32 PM

## 2020-06-23 DIAGNOSIS — I509 Heart failure, unspecified: Secondary | ICD-10-CM

## 2020-06-23 DIAGNOSIS — Z95810 Presence of automatic (implantable) cardiac defibrillator: Secondary | ICD-10-CM | POA: Diagnosis not present

## 2020-06-23 DIAGNOSIS — I5023 Acute on chronic systolic (congestive) heart failure: Secondary | ICD-10-CM | POA: Diagnosis not present

## 2020-06-23 DIAGNOSIS — N1832 Chronic kidney disease, stage 3b: Secondary | ICD-10-CM | POA: Diagnosis not present

## 2020-06-23 LAB — BASIC METABOLIC PANEL
Anion gap: 12 (ref 5–15)
BUN: 35 mg/dL — ABNORMAL HIGH (ref 8–23)
CO2: 25 mmol/L (ref 22–32)
Calcium: 8.8 mg/dL — ABNORMAL LOW (ref 8.9–10.3)
Chloride: 103 mmol/L (ref 98–111)
Creatinine, Ser: 1.51 mg/dL — ABNORMAL HIGH (ref 0.61–1.24)
GFR, Estimated: 44 mL/min — ABNORMAL LOW (ref >60)
Glucose, Bld: 57 mg/dL — ABNORMAL LOW (ref 70–99)
Potassium: 3.3 mmol/L — ABNORMAL LOW (ref 3.5–5.1)
Sodium: 140 mmol/L (ref 135–145)

## 2020-06-23 LAB — GLUCOSE, CAPILLARY
Glucose-Capillary: 80 mg/dL (ref 70–99)
Glucose-Capillary: 64 mg/dL — ABNORMAL LOW (ref 70–99)
Glucose-Capillary: 140 mg/dL — ABNORMAL HIGH (ref 70–99)
Glucose-Capillary: 141 mg/dL — ABNORMAL HIGH (ref 70–99)
Glucose-Capillary: 117 mg/dL — ABNORMAL HIGH (ref 70–99)
Glucose-Capillary: 174 mg/dL — ABNORMAL HIGH (ref 70–99)

## 2020-06-23 MED ORDER — POTASSIUM CHLORIDE CRYS ER 20 MEQ PO TBCR
40.0000 meq | EXTENDED_RELEASE_TABLET | Freq: Once | ORAL | Status: AC
  Administered 2020-06-23: 40 meq via ORAL
  Filled 2020-06-23: qty 2

## 2020-06-23 MED ORDER — INSULIN REGULAR HUMAN (CONC) 500 UNIT/ML ~~LOC~~ SOPN
50.0000 [IU] | PEN_INJECTOR | Freq: Two times a day (BID) | SUBCUTANEOUS | Status: DC
  Administered 2020-06-24: 50 [IU] via SUBCUTANEOUS

## 2020-06-23 NOTE — Progress Notes (Signed)
Occupational Therapy Treatment Patient Details Name: Vernon Briggs MRN: [id number] DOB: 1946/06/16 Today's Date: 06/23/2020    History of present illness 74 year old man 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 presented with worsening shortness of breath and swelling.  Admitted for acute on chronic systolic CHF   OT comments  Patient with increased dyspnea with exertion this date.  Appears more confused, increased time for processing.  In addition, generalized weakness, fear of falling and decreased sit/stand balance contribute to declines with ADL and mobility status.  Patient remains on 3L of O2 via [place]: O2 sats remained at 98% despite patient complaining he could not breath.  HR up to 86 BPM.  Spouse states patient is having a hard day today.  Patient declined any additional therapies for the session.  Left up in recliner.  Nursing Tech informed of 200cc of urine output  OT will continue to follow in the acute setting.  Spouse states patient needs to transfer himself to go home.    Follow Up Recommendations  Supervision/Assistance - 24 hour;Home health OT    Equipment Recommendations  None recommended by OT    Recommendations for Other Services      Precautions / Restrictions Precautions Precautions: Fall Precaution Comments: h/o falls at home Restrictions Weight Bearing Restrictions: No       Mobility Bed Mobility Overal bed mobility: Needs Assistance       Supine to sit: Min assist;HOB elevated        Transfers Overall transfer level: Needs assistance Equipment used: Rolling walker (2 wheeled) Transfers: Sit to/from Stand Sit to Stand: Mod assist Stand pivot transfers: Min assist       General transfer comment: assist to guide RW.  Cues when to sit.    Balance   Sitting-balance support: Feet supported;No upper extremity supported Sitting balance-Leahy Scale: Fair      Standing balance support: Bilateral upper extremity supported Standing balance-Leahy Scale: Poor                                                 Cognition Arousal/Alertness: Awake/alert Behavior During Therapy: Flat affect Overall Cognitive Status: Within Functional Limits for tasks assessed                                 General Comments: spouse present.          Frequency  Min 2X/week        Progress Toward Goals  OT Goals(current goals can now be found in the care plan section)  Progress towards OT goals: Not progressing toward goals - comment (Increased SOB with exertion)  Acute Rehab OT Goals Patient Stated Goal: I want to go home OT Goal Formulation: With patient Time For Goal Achievement: 07/05/20 Potential to Achieve Goals: Fair  Plan      Co-evaluation                 AM-PAC OT "6 Clicks" Daily Activity     Outcome Measure   Help from another person eating meals?: A Little Help from another person taking care of personal grooming?: A Little Help from another person toileting, which includes using toliet, bedpan, or urinal?: A Lot Help from another person  bathing (including washing, rinsing, drying)?: A Lot Help from another person to put on and taking off regular upper body clothing?: A Lot Help from another person to put on and taking off regular lower body clothing?: A Lot 6 Click Score: 14    End of Session Equipment Utilized During Treatment: Rolling walker;Oxygen  OT Visit Diagnosis: Unsteadiness on feet (R26.81);Muscle weakness (generalized) (M62.81);Pain;History of falling (Z91.81);Hemiplegia and hemiparesis   Activity Tolerance Patient limited by fatigue   Patient Left in chair;with call bell/phone within reach;with chair alarm set;with family/visitor present   Nurse Communication Other (comment) (urine output)        Time: 1580-6386 OT Time Calculation (min): 16 min  Charges: OT General  Charges $OT Visit: 1 Visit OT Treatments $Therapeutic Activity: 8-22 mins  06/23/2020  Rich, OTR/L  Acute Rehabilitation Services  Office:  [phone]    Metta Clines 06/23/2020, 12:14 PM

## 2020-06-23 NOTE — Progress Notes (Signed)
Spent time with patient and spouse discussing goals of care. Patient was not participating in conversation but seemed to be listening, and spouse became extremely tearful when this was brought up. She is increasingly stressed with caring for him at home but has agreed to never place him in a facility. However, he is not currently able to care for himself and requires significant and frequent assistance to perform ADLs, and she has health issues of her own. She stated that they had a Hospice consult approximately 2 weeks ago but did not seem clear as to what Hospice could provide. This was discussed in further detail with her and patient, alleviating some concerns'; however, it does not appear spouse is emotionally ready to make this decision and patient does not participate in conversation. As such, it is difficult to get a clear understanding of patient's cognitive level, as he either chooses not to answer/participate, or he is unable to. Additionally, he is very restless and c/o SOB despite having 100% sat levels. To help alleviate the perception of air hunger, a fan was placed blowing into his face, which helped and allowed him to fall asleep. Will continue to support patient and family with diagnosis and current symptom management.

## 2020-06-23 NOTE — Progress Notes (Signed)
[facility] PROGRESS NOTE    Progress Note  Vernon Briggs  URK:[id number] DOB: 1946-05-16 DOA: 06/18/2020 PCP: [name], MD     Brief Narrative:   Vernon Briggs is an 74 y.o. 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  Assessment/Plan:   Acute respiratory failure with hypoxia secondarily to acute on chronic systolic CHF (congestive heart failure) ([place]) In the context of recently decreased torsemide and discontinuation of Aldactone. Still requiring supplemental oxygen to keep saturations greater than 90%. He was started on dose IV Lasix cardiology was consulted.  They recommended to continue Crestor Plavix Imdur beta-blocker Farxiga hydralazine. Previous admission recommended not to start ACE inhibitor Aldactone. TEE showed a significant change in EF to 25% akinetic wall. Urine output has significantly increased.  He still appears fluid overloaded in distress.  Elevated troponin's: Cardiology has been consulted high sensitive troponins were elevated not consistent with ACS, 2D echo akinetic wall. Continue Plavix and Eliquis.  Coreg Imdur and Crestor. Further management per cardiology  Diabetes mellitus type 2 with complications: Continue long-acting insulin U5 100+ sliding scale.  Episode of hypoglycemia this morning decreased U5 100.  Chronic kidney disease stage IIIb: Ending appears to be at baseline continue to monitor closely with diuresis.  During. Replete potassium orally.  Asthma atrial fibrillation: Paced rhythm, on Coreg and apixaban.  Obstructive sleep apnea: Continue CPAP.  DVT prophylaxis: Apixiban Family Communication:none Status is: Inpatient  Remains inpatient appropriate because:Hemodynamically unstable   Dispo: The patient is from: Home               Anticipated d/c is to: Home              Anticipated d/c date is: 3 days              Patient currently is not medically stable to d/c.        Code Status:     Code Status Orders  (From admission, onward)         Start     Ordered   06/14/2020 2152  Do not attempt resuscitation (DNR)  Continuous       Question Answer Comment  In the event of cardiac or respiratory ARREST Do not call a "code blue"   In the event of cardiac or respiratory ARREST Do not perform Intubation, CPR, defibrillation or ACLS   In the event of cardiac or respiratory ARREST Use medication by any route, position, wound care, and other measures to relive pain and suffering. May use oxygen, suction and manual treatment of airway obstruction as needed for comfort.      06/23/2020 2152        Code Status History    Date Active Date Inactive Code Status Order ID Comments User Context   05/05/2020 2342 05/10/2020 2144 DNR [id number]  Etta Quill, DO ED   11/25/2019 1237 11/26/2019 2031 DNR [id number]  Oswald Hillock, MD Inpatient   11/24/2019 1339 11/25/2019 1237 Full Code [id number]  [name], MD Inpatient   11/20/2019 0150 11/24/2019 1338 DNR [id number]  Rise Patience, MD Inpatient   04/02/2015 1725 04/05/2015 1557 DNR [id number]  Melton Alar, PA-C Inpatient   09/14/2014 0952 09/15/2014 1430 Full Code [id number]  Leonie Man, MD Inpatient   09/09/2014 0331 09/14/2014 0952 Full Code [id number]  Lamar Sprinkles, MD Inpatient  10/20/2012 1922 10/22/2012 1421 DNR [id number]  Marzetta Board, MD ED   Advance Care Planning Activity        IV Access:    Peripheral IV   Procedures and diagnostic studies:   No results found.   Medical Consultants:    None.  Anti-Infectives:   none  Subjective:    Vernon Briggs relates his breathing is not better still requiring oxygen to keep saturation greater than 90%  Objective:    Vitals:   06/22/20 2027 06/23/20 0653 06/23/20 0833 06/23/20  0834  BP: 127/73 (!) 149/85 (!) 159/87 (!) 159/87  Pulse: 71 80 85 85  Resp:  18  16  Temp: 98.3 F (36.8 C) 98.8 F (37.1 C)    TempSrc: Oral Oral    SpO2: 100% 100%  100%  Weight:  124.5 kg     SpO2: 100 % O2 Flow Rate (L/min): 2 L/min   Intake/Output Summary (Last 24 hours) at 06/23/2020 1206 Last data filed at 06/23/2020 1001 Gross per 24 hour  Intake 803 ml  Output 1300 ml  Net -497 ml   Filed Weights   06/20/20 0138 06/21/20 0348 06/23/20 0653  Weight: 127.8 kg 124.6 kg 124.5 kg    Exam: General exam: In no acute distress. Respiratory system: Good air movement and clear to auscultation. Cardiovascular system: S1 & S2 heard, RRR. No JVD. Gastrointestinal system: Abdomen is nondistended, soft and nontender.  Extremities: Trace edema Skin: No rashes, lesions or ulcers Psychiatry: Judgement and insight appear normal. Mood & affect appropriate.    Data Reviewed:    Labs: Basic Metabolic Panel: Recent Labs  Lab 06/06/2020 1730 07/02/2020 1730 06/20/20 0500 06/20/20 0500 06/21/20 0506 06/21/20 0506 06/22/20 0204 06/23/20 0450  NA 143  --  143  --  141  --  139 140  K 3.9   < > 3.6   < > 3.8   < > 3.6 3.3*  CL 109  --  107  --  104  --  101 103  CO2 23  --  23  --  25  --  25 25  GLUCOSE 83  --  133*  --  227*  --  210* 57*  BUN 36*  --  31*  --  39*  --  38* 35*  CREATININE 1.66*  --  1.65*  --  1.72*  --  1.65* 1.51*  CALCIUM 9.1  --  8.7*  --  8.8*  --  8.9 8.8*   < > = values in this interval not displayed.   GFR Estimated Creatinine Clearance: 56.7 mL/min (A) (by C-G formula based on SCr of 1.51 mg/dL (H)). Liver Function Tests: Recent Labs  Lab 06/06/2020 2033  AST 21  ALT 13  ALKPHOS 87  BILITOT 1.0  PROT 6.3*  ALBUMIN 3.6   No results for input(s): LIPASE, AMYLASE in the last 168 hours. No results for input(s): AMMONIA in the last 168 hours. Coagulation profile Recent Labs  Lab 06/27/2020 2033  INR 1.4*   COVID-19 Labs  No results for  input(s): DDIMER, FERRITIN, LDH, CRP in the last 72 hours.  Lab Results  Component Value Date   SARSCOV2NAA NEGATIVE 06/18/2020   [place] NEGATIVE 05/05/2020   [place] NEGATIVE 11/19/2019    CBC: Recent Labs  Lab 06/21/2020 1730  WBC 8.6  HGB 13.0  HCT 40.7  MCV 101.5*  PLT 120*   Cardiac Enzymes: No results for input(s): CKTOTAL, CKMB, CKMBINDEX, TROPONINI in the last  168 hours. BNP (last 3 results) No results for input(s): PROBNP in the last 8760 hours. CBG: Recent Labs  Lab 06/22/20 1635 06/22/20 2120 06/22/20 2305 06/23/20 0654 06/23/20 0831  GLUCAP 158* 80 71 64* 140*   D-Dimer: No results for input(s): DDIMER in the last 72 hours. Hgb A1c: No results for input(s): HGBA1C in the last 72 hours. Lipid Profile: No results for input(s): CHOL, HDL, LDLCALC, TRIG, CHOLHDL, LDLDIRECT in the last 72 hours. Thyroid function studies: No results for input(s): TSH, T4TOTAL, T3FREE, THYROIDAB in the last 72 hours.  Invalid input(s): FREET3 Anemia work up: No results for input(s): VITAMINB12, FOLATE, FERRITIN, TIBC, IRON, RETICCTPCT in the last 72 hours. Sepsis Labs: Recent Labs  Lab 06/18/2020 1730 06/17/2020 2033  WBC 8.6  --   LATICACIDVEN  --  1.0   Microbiology Recent Results (from the past 240 hour(s))  Respiratory Panel by RT PCR (Flu A&B, Covid) - Nasopharyngeal Swab     Status: None   Collection Time: 06/10/2020  8:33 PM   Specimen: Nasopharyngeal Swab  Result Value Ref Range Status   SARS Coronavirus 2 by RT PCR NEGATIVE NEGATIVE Final    Comment: (NOTE) SARS-CoV-2 target nucleic acids are NOT DETECTED.  The SARS-CoV-2 RNA is generally detectable in upper respiratoy specimens during the acute phase of infection. The lowest concentration of SARS-CoV-2 viral copies this assay can detect is 131 copies/mL. A negative result does not preclude SARS-Cov-2 infection and should not be used as the sole basis for treatment or other patient management  decisions. A negative result may occur with  improper specimen collection/handling, submission of specimen other than nasopharyngeal swab, presence of viral mutation(s) within the areas targeted by this assay, and inadequate number of viral copies (<131 copies/mL). A negative result must be combined with clinical observations, patient history, and epidemiological information. The expected result is Negative.  Fact Sheet for Patients:  PinkCheek.be  Fact Sheet for Healthcare Providers:  GravelBags.it  This test is no t yet approved or cleared by the [place] FDA and  has been authorized for detection and/or diagnosis of SARS-CoV-2 by FDA under an Emergency Use Authorization (EUA). This EUA will remain  in effect (meaning this test can be used) for the duration of the COVID-19 declaration under Section 564(b)(1) of the Act, 21 U.S.C. section 360bbb-3(b)(1), unless the authorization is terminated or revoked sooner.     Influenza A by PCR NEGATIVE NEGATIVE Final   Influenza B by PCR NEGATIVE NEGATIVE Final    Comment: (NOTE) The Xpert Xpress SARS-CoV-2/FLU/RSV assay is intended as an aid in  the diagnosis of influenza from Nasopharyngeal swab specimens and  should not be used as a sole basis for treatment. Nasal washings and  aspirates are unacceptable for Xpert Xpress SARS-CoV-2/FLU/RSV  testing.  Fact Sheet for Patients: PinkCheek.be  Fact Sheet for Healthcare Providers: GravelBags.it  This test is not yet approved or cleared by the [place] FDA and  has been authorized for detection and/or diagnosis of SARS-CoV-2 by  FDA under an Emergency Use Authorization (EUA). This EUA will remain  in effect (meaning this test can be used) for the duration of the  Covid-19 declaration under Section 564(b)(1) of the Act, 21  U.S.C. section 360bbb-3(b)(1), unless the  authorization is  terminated or revoked. Performed at [facility], [place] [address] [place], [place] [address]      Medications:   . apixaban  5 mg Oral BID  . carvedilol  3.125 mg  Oral BID WC  . cholestyramine  4 g Oral Q1400  . clopidogrel  75 mg Oral Daily  . dapagliflozin propanediol  10 mg Oral Daily  . FLUoxetine  40 mg Oral QHS  . furosemide  80 mg Intravenous Q8H  . hydrALAZINE  25 mg Oral Q8H  . insulin aspart  0-15 Units Subcutaneous TID WC  . insulin aspart  0-5 Units Subcutaneous QHS  . insulin regular human CONCENTRATED  55 Units Subcutaneous BID WC  . isosorbide mononitrate  30 mg Oral Daily  . potassium chloride  40 mEq Oral Once  . rosuvastatin  20 mg Oral QHS  . sodium chloride flush  3 mL Intravenous Q12H   Continuous Infusions: . sodium chloride        LOS: 4 days   Charlynne Cousins  [facility]  06/23/2020, 12:06 PM

## 2020-06-23 NOTE — Progress Notes (Signed)
Heart Failure Stewardship Pharmacist Progress Note   PCP: [name], MD PCP-Cardiologist: Fransico Him, MD    HPI:  74 yo M with PMH of CHF, CAD, type 2 diabetes, afib, and prior stroke. He presented to the ED on 06/27/2020 with acute on chronic CHF. ECHO done on 06/20/20 and LVEF is 25-30% (stable from prior ECHO in March 2021).  Current HF Medications: Furosemide 80 mg IV q8h Carvedilol 3.125 mg BID Farxiga 10 mg daily Imdur 30 mg daily Hydralazine 25 mg q8h  Prior to admission HF Medications: Torsemide 20 mg daily Carvedilol 3.125 mg BID Imdur 30 mg daily  Pertinent Lab Values: . Serum creatinine 1.51, BUN 35, Potassium 3.3, Sodium 140, BNP 330.8  Vital Signs: . Weight: 274 lbs (admission weight: 281 lbs) . Blood pressure: 150/80s  . Heart rate: 80s  Medication Assistance / Insurance Benefits Check: Does the patient have prescription insurance?  Yes Type of insurance plan: [place] Medicare; [place]  Outpatient Pharmacy:  Prior to admission outpatient pharmacy: [facility] Is the patient willing to use [place] at discharge? Yes   Assessment: 1. Acute on chronic systolic CHF (EF 50-38%), due to ischemic cardiomyopathy. NYHA class II symptoms. - Continue furosemide 80 mg IV q8h. Urine output 2.8L yesterday. Replace potassium. - Continue carvedilol 3.125 mg BID. Caution further increases at this time with aggressive diuresis. - No ACE/ARB/ARNI or spironolactone (was previously on lisinopril and spironolactone but these were stopped due to AKI last admission). - Continue Farxiga 10 mg daily - Continue Imdur 30 mg daily - Continue hydralazine 25 mg q8h. Could increase to 50 mg q8h for additional BP reduction.   Plan: 1) Medication changes recommended at this time: - Increase hydralazine to 50 mg q8h  2) Patient assistance application(s): - None pending  3)  Education  - To be completed prior to discharge  Kerby Nora, PharmD, BCPS Heart Failure  Stewardship Pharmacist Phone [phone]

## 2020-06-23 NOTE — Progress Notes (Signed)
Patient has home CPAP for use and will self place when ready.  

## 2020-06-23 NOTE — Progress Notes (Addendum)
The patient has been seen in conjunction with Alan Mulder, NP. All aspects of care have been considered and discussed. The patient has been personally interviewed, examined, and all clinical data has been reviewed.  Still volume overloaded based upon edema and neck veins.  BUN and creatinine are slightly improved. Continue diuresis at current level.   Progress Note  Patient Name: Vernon Briggs Date of Encounter: 06/23/2020  [facility] Cardiologist: Fransico Him, MD   Subjective   Feels his breathing is not much better today. Currently sitting up on the side of the bed with PT.   Attempting to wean from O2, currently down to 2L   Inpatient Medications    Scheduled Meds:  apixaban  5 mg Oral BID   carvedilol  3.125 mg Oral BID WC   cholestyramine  4 g Oral Q1400   clopidogrel  75 mg Oral Daily   dapagliflozin propanediol  10 mg Oral Daily   FLUoxetine  40 mg Oral QHS   furosemide  80 mg Intravenous Q8H   hydrALAZINE  25 mg Oral Q8H   insulin aspart  0-15 Units Subcutaneous TID WC   insulin aspart  0-5 Units Subcutaneous QHS   insulin regular human CONCENTRATED  55 Units Subcutaneous BID WC   isosorbide mononitrate  30 mg Oral Daily   rosuvastatin  20 mg Oral QHS   sodium chloride flush  3 mL Intravenous Q12H   Continuous Infusions:  sodium chloride     PRN Meds: sodium chloride, acetaminophen, ipratropium-albuterol, loperamide, ondansetron (ZOFRAN) IV, sodium chloride flush   Vital Signs    Vitals:   06/22/20 2027 06/23/20 0653 06/23/20 0833 06/23/20 0834  BP: 127/73 (!) 149/85 (!) 159/87 (!) 159/87  Pulse: 71 80 85 85  Resp:  18  16  Temp: 98.3 F (36.8 C) 98.8 F (37.1 C)    TempSrc: Oral Oral    SpO2: 100% 100%  100%  Weight:  124.5 kg      Intake/Output Summary (Last 24 hours) at 06/23/2020 1131 Last data filed at 06/23/2020 1001 Gross per 24 hour  Intake 803 ml  Output 1750 ml  Net -947 ml   Last 3 Weights 06/23/2020 06/21/2020 06/20/2020   Weight (lbs) 274 lb 8 oz 274 lb 11.2 oz 281 lb 12 oz  Weight (kg) 124.512 kg 124.603 kg 127.8 kg      Telemetry    Vpaced - Personally Reviewed  ECG    No new tracing.   Physical Exam  Pleasant older WM GEN: No acute distress.   Neck: No JVD Cardiac: RRR, no murmurs, rubs, or gallops.  Respiratory: Diminished in lower lobes.  GI: Soft, nontender, non-distended  MS: 2+ pitting edema to knees bilaterally; No deformity. Neuro:  Nonfocal  Psych: Normal affect   Labs    High Sensitivity Troponin:   Recent Labs  Lab 06/14/2020 1730 06/21/2020 2033  TROPONINIHS 73* 66*      Chemistry Recent Labs  Lab 07/02/2020 2033 06/20/20 0500 06/21/20 0506 06/22/20 0204 06/23/20 0450  NA  --    < > 141 139 140  K  --    < > 3.8 3.6 3.3*  CL  --    < > 104 101 103  CO2  --    < > 25 25 25   GLUCOSE  --    < > 227* 210* 57*  BUN  --    < > 39* 38* 35*  CREATININE  --    < >  1.72* 1.65* 1.51*  CALCIUM  --    < > 8.8* 8.9 8.8*  PROT 6.3*  --   --   --   --   ALBUMIN 3.6  --   --   --   --   AST 21  --   --   --   --   ALT 13  --   --   --   --   ALKPHOS 87  --   --   --   --   BILITOT 1.0  --   --   --   --   GFRNONAA  --    < > 38* 40* 44*  ANIONGAP  --    < > 12 13 12    < > = values in this interval not displayed.     Hematology Recent Labs  Lab 07/04/2020 1730  WBC 8.6  RBC 4.01*  HGB 13.0  HCT 40.7  MCV 101.5*  MCH 32.4  MCHC 31.9  RDW 14.3  PLT 120*    BNP Recent Labs  Lab 06/12/2020 2033  BNP 330.8*     DDimer No results for input(s): DDIMER in the last 168 hours.   Radiology    No results found.  Cardiac Studies   2D Doppler echocardiogram 06/20/2020: IMPRESSIONS     1. Left ventricular ejection fraction, by estimation, is 25 to 30%. The  left ventricle has severely decreased function. The left ventricle  demonstrates regional wall motion abnormalities (see scoring  diagram/findings for description). The left  ventricular internal cavity size  was mildly dilated. Left ventricular  diastolic parameters are indeterminate.   2. Right ventricular systolic function is mildly reduced. The right  ventricular size is mildly enlarged. There is moderately elevated  pulmonary artery systolic pressure. The estimated right ventricular  systolic pressure is 29.7 mmHg.   3. Left atrial size was mildly dilated.   4. Right atrial size was moderately dilated.   5. The mitral valve is grossly normal. Moderate mitral valve  regurgitation. No evidence of mitral stenosis.   6. Tricuspid valve regurgitation is moderate to severe.   7. The aortic valve is calcified. There is mild calcification of the  aortic valve. Aortic valve regurgitation is mild. No aortic stenosis is  present.   8. Aortic dilatation noted. There is mild dilatation of the ascending  aorta, measuring 42 mm.   9. The inferior vena cava is dilated in size with <50% respiratory  variability, suggesting right atrial pressure of 15 mmHg.    Patient Profile     74 y.o. male with PMH of CAD s/p CABG x4 (LIMA to LAD, SVG to [facility], SVG to OM1, and SVG to RI) in 1999 with multiple subsequent stenting (PCI x6 around 2011 at [facility], [place] to LCX in 2016, and most recently PCI/DES to left main in 11/2019), chronic combined CHF/ischemic cardiomyopathy with EF of 25-30% on Echo in 11/2018, s/p Biotronik ICD in 2013, paroxsymal atrial fibrillation on Eliquis, prior left frontal CVA, hypertension, hyperlipidemia, type 2 diabetes mellitus, CKD stage III, obstructive sleep apnea on CPAP, anxiety/depression, and PTSD who was admitted with volume overload.  Assessment & Plan    1. Acute on Chronic combined HF: Net -3.6L, weight trending down 281>>274lbs. Feels his breathing is not much better today. Still has significant volume noted on exam -- continue with IV diuresis 80mg  IV TID today as renal function in stable.   2. Demand Ischemia: Long history of CAD. S/p  CAD s/p CABG x4 (LIMA to LAD, SVG to  [facility], SVG to OM1, and SVG to RI) in 1999 with multiple subsequent stenting (PCII x6 around 2011 at [facility], [place] to LCX in 2016, and most recently PCI/DES to left main in 11/2019). -- High-sensitivity troponin minimally elevated and flat at 73 >> 66. Not consistent with ACS. Likely demand ischemia in setting of CHF.  -- Continue Plavix 75mg . Not on Aspirin due to need for Eliquis. Continue Coreg 3.125mg  twice daily, Imdur 30mg  daily, and Crestor 20mg  daily.   3. Paroxysmal Atrial Fibrillation: Maintaining sinus rhythm with ventricular pacing. -- Continue Coreg and Eliquis 5mg  twice daily.   4. Hypertension: intermittently elevated. -- Continue Coreg and Imdur along with Hydralazine.    5. Hyperlipidemia: -- Continue Crestor 20mg  daily.   6. Type 2 Diabetes Mellitus: -- On concentrated U-500 insulin at home. -- [place]ade 10mg  daily added this admission -- Management per primary team.   7. CKD Stage III: Creatinine improved to 1.5 this morning -- Continue to monitor closely.  8. OSA: on CPAP   For questions or updates, please contact [place] Please consult [email or web address] for contact info under        Signed, Reino Bellis, NP  06/23/2020, 11:31 AM

## 2020-06-24 DIAGNOSIS — J9601 Acute respiratory failure with hypoxia: Secondary | ICD-10-CM | POA: Diagnosis not present

## 2020-06-24 DIAGNOSIS — N1832 Chronic kidney disease, stage 3b: Secondary | ICD-10-CM | POA: Diagnosis not present

## 2020-06-24 DIAGNOSIS — I5023 Acute on chronic systolic (congestive) heart failure: Secondary | ICD-10-CM | POA: Diagnosis not present

## 2020-06-24 DIAGNOSIS — I10 Essential (primary) hypertension: Secondary | ICD-10-CM | POA: Diagnosis not present

## 2020-06-24 DIAGNOSIS — I509 Heart failure, unspecified: Secondary | ICD-10-CM | POA: Diagnosis not present

## 2020-06-24 DIAGNOSIS — Z95810 Presence of automatic (implantable) cardiac defibrillator: Secondary | ICD-10-CM | POA: Diagnosis not present

## 2020-06-24 LAB — BASIC METABOLIC PANEL
Anion gap: 12 (ref 5–15)
BUN: 36 mg/dL — ABNORMAL HIGH (ref 8–23)
CO2: 26 mmol/L (ref 22–32)
Calcium: 8.7 mg/dL — ABNORMAL LOW (ref 8.9–10.3)
Chloride: 98 mmol/L (ref 98–111)
Creatinine, Ser: 1.76 mg/dL — ABNORMAL HIGH (ref 0.61–1.24)
GFR, Estimated: 37 mL/min — ABNORMAL LOW (ref >60)
Glucose, Bld: 171 mg/dL — ABNORMAL HIGH (ref 70–99)
Potassium: 3.7 mmol/L (ref 3.5–5.1)
Sodium: 136 mmol/L (ref 135–145)

## 2020-06-24 LAB — CBC WITH DIFFERENTIAL/PLATELET
Abs Immature Granulocytes: 0.01 10*3/uL (ref 0.00–0.07)
Basophils Absolute: 0 10*3/uL (ref 0.0–0.1)
Basophils Relative: 0 %
Eosinophils Absolute: 0 10*3/uL (ref 0.0–0.5)
Eosinophils Relative: 0 %
HCT: 35.9 % — ABNORMAL LOW (ref 39.0–52.0)
Hemoglobin: 12.2 g/dL — ABNORMAL LOW (ref 13.0–17.0)
Immature Granulocytes: 0 %
Lymphocytes Relative: 10 %
Lymphs Abs: 0.3 10*3/uL — ABNORMAL LOW (ref 0.7–4.0)
MCH: 32.6 pg (ref 26.0–34.0)
MCHC: 34 g/dL (ref 30.0–36.0)
MCV: 96 fL (ref 80.0–100.0)
Monocytes Absolute: 0.1 10*3/uL (ref 0.1–1.0)
Monocytes Relative: 4 %
Neutro Abs: 2.7 10*3/uL (ref 1.7–7.7)
Neutrophils Relative %: 86 %
Platelets: 79 10*3/uL — ABNORMAL LOW (ref 150–400)
RBC: 3.74 MIL/uL — ABNORMAL LOW (ref 4.22–5.81)
RDW: 14.5 % (ref 11.5–15.5)
WBC: 3.2 10*3/uL — ABNORMAL LOW (ref 4.0–10.5)
nRBC: 0 % (ref 0.0–0.2)

## 2020-06-24 LAB — GLUCOSE, CAPILLARY
Glucose-Capillary: 192 mg/dL — ABNORMAL HIGH (ref 70–99)
Glucose-Capillary: 336 mg/dL — ABNORMAL HIGH (ref 70–99)
Glucose-Capillary: 169 mg/dL — ABNORMAL HIGH (ref 70–99)
Glucose-Capillary: 127 mg/dL — ABNORMAL HIGH (ref 70–99)

## 2020-06-24 MED ORDER — ISOSORBIDE MONONITRATE ER 60 MG PO TB24
60.0000 mg | ORAL_TABLET | Freq: Every day | ORAL | Status: DC
  Administered 2020-06-25 – 2020-06-30 (×6): 60 mg via ORAL
  Filled 2020-06-24 (×6): qty 1

## 2020-06-24 MED ORDER — INSULIN REGULAR HUMAN (CONC) 500 UNIT/ML ~~LOC~~ SOPN
50.0000 [IU] | PEN_INJECTOR | Freq: Two times a day (BID) | SUBCUTANEOUS | Status: DC
  Administered 2020-06-24 – 2020-06-30 (×12): 50 [IU] via SUBCUTANEOUS

## 2020-06-24 MED ORDER — HYDRALAZINE HCL 25 MG PO TABS
25.0000 mg | ORAL_TABLET | Freq: Four times a day (QID) | ORAL | Status: DC
  Administered 2020-06-24 – 2020-06-30 (×16): 25 mg via ORAL
  Filled 2020-06-24 (×19): qty 1

## 2020-06-24 MED ORDER — INSULIN ASPART 100 UNIT/ML ~~LOC~~ SOLN
6.0000 [IU] | Freq: Three times a day (TID) | SUBCUTANEOUS | Status: DC
  Administered 2020-06-24 – 2020-06-26 (×6): 6 [IU] via SUBCUTANEOUS

## 2020-06-24 MED ORDER — FUROSEMIDE 10 MG/ML IJ SOLN
80.0000 mg | Freq: Every day | INTRAMUSCULAR | Status: DC
  Administered 2020-06-24 – 2020-06-25 (×2): 80 mg via INTRAVENOUS
  Filled 2020-06-24 (×2): qty 8

## 2020-06-24 NOTE — Progress Notes (Signed)
Respiratory Therapist was notified due to the patient's increased restless and c/o SOB despite having 100% sat levels. RT came and assessed the patient. His oxygen was increased on his Cpap to 10 liters by RT. He was also repositioned for comfort. Will continue to monitor for patient safety.

## 2020-06-24 NOTE — Progress Notes (Signed)
Heart Failure Stewardship Pharmacist Progress Note   PCP: [name], MD PCP-Cardiologist: Fransico Him, MD    HPI:  74 yo M with PMH of CHF, CAD, type 2 diabetes, afib, and prior stroke. He presented to the ED on 06/30/2020 with acute on chronic CHF. ECHO done on 06/20/20 and LVEF is 25-30% (stable from prior ECHO in March 2021).  Current HF Medications: Carvedilol 3.125 mg BID Farxiga 10 mg daily Imdur 30 mg daily Hydralazine 25 mg q8h  Prior to admission HF Medications: Torsemide 20 mg daily Carvedilol 3.125 mg BID Imdur 30 mg daily  Pertinent Lab Values: . Serum creatinine 1.76, BUN 36, Potassium 3.7, Sodium 136, BNP 330.8  Vital Signs: . Weight: 270.95 lbs (admission weight: 281 lbs) . Blood pressure: 145/69 . Heart rate: 85  Medication Assistance / Insurance Benefits Check: Does the patient have prescription insurance?  Yes Type of insurance plan: [place] Medicare; [place]  Outpatient Pharmacy:  [facility] Is the patient willing to use [place] at discharge? Yes   Assessment: 1. Acute on chronic systolic CHF (EF 06-00%), due to ischemic cardiomyopathy. NYHA class II symptoms. - Completed furosemide and potassium yesterday - Continue carvedilol 3.125 mg BID. Caution further increases at this time with aggressive diuresis. - No ACE/ARB/ARNI or spironolactone (was previously on lisinopril and spironolactone but these were stopped due to AKI last admission). - Continue Farxiga 10 mg daily - Continue Imdur 30 mg daily - Continue hydralazine 25 mg q8h. Given completion of furosemide and increase blood pressure, could increase to 50 mg q8h   Plan: 1) Medication changes recommended at this time: - If no plans of adding furosemide back on, consider increasing hydralazine to 50 mg q8h  2) Patient assistance application(s): - None pending  3)  Education  - To be completed prior to discharge  Harriet Pho, PharmD PGY-1  Community Pharm[facility] Resident   06/24/2020 3:29 PM   Kerby Nora, PharmD, BCPS Heart Failure Stewardship Pharmacist Phone [phone]

## 2020-06-24 NOTE — Progress Notes (Signed)
Patient complained of shortness of breath, RT gave patient PRN treatment and increased oxygen on CPAP to 10L for patient comfort. Patient states that he can breathe a little better. All other vitals currently stable, no distress noted.

## 2020-06-24 NOTE — Progress Notes (Signed)
[facility] PROGRESS NOTE    Progress Note  Jeramy Dimmick  HQP:[id number] DOB: 1945/11/23 DOA: 06/25/2020 PCP: [name], MD     Brief Narrative:   Vernon Briggs is an 74 y.o. 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  Assessment/Plan:   Acute respiratory failure with hypoxia secondarily to acute on chronic systolic CHF (congestive heart failure): In the context of recently decreased torsemide and discontinuation of Aldactone. Cardiology has been consulted, started on IV Lasix on admission he is negative about 4 L. We will continue Crestor, Plavix, Imdur, beta-blocker, Farxiga and hydralazine. On his previous admission it was recommended not to start an ACE inhibitor Aldactone. TTE showed a significant change in his EF to 25% with akinetic wall. Patient seems very anxious, I returned to try to wean him off he starts complaining that he cannot breathe although his saturations heart rate and respiratory rate stayed the same. Try to wean him to room air.  Elevated troponin's: Continue Plavix, Eliquis, Coreg, indoor and Crestor. Likely demand ischemia in the setting of acute systolic heart failure.  Diabetes mellitus type 2 with complications: Continue long-acting insulin U5 100+ sliding scale.  Episode of hypoglycemia this morning decreased U5 100.  Chronic kidney disease stage IIIb: Ending appears to be at baseline continue to monitor closely with diuresis.  During. Replete potassium orally.  Asthma atrial fibrillation: Paced rhythm, on Coreg and apixaban.  Obstructive sleep apnea: Continue CPAP.  DVT prophylaxis: Apixiban Family Communication:none Status is: Inpatient  Remains inpatient appropriate because:Hemodynamically unstable   Dispo: The patient is from:  Home              Anticipated d/c is to: Home              Anticipated d/c date is: 3 days              Patient currently is not medically stable to d/c.        Code Status:     Code Status Orders  (From admission, onward)         Start     Ordered   06/18/2020 2152  Do not attempt resuscitation (DNR)  Continuous       Question Answer Comment  In the event of cardiac or respiratory ARREST Do not call a "code blue"   In the event of cardiac or respiratory ARREST Do not perform Intubation, CPR, defibrillation or ACLS   In the event of cardiac or respiratory ARREST Use medication by any route, position, wound care, and other measures to relive pain and suffering. May use oxygen, suction and manual treatment of airway obstruction as needed for comfort.      06/25/2020 2152        Code Status History    Date Active Date Inactive Code Status Order ID Comments User Context   05/05/2020 2342 05/10/2020 2144 DNR [id number]  Etta Quill, DO ED   11/25/2019 1237 11/26/2019 2031 DNR [id number]  Oswald Hillock, MD Inpatient   11/24/2019 1339 11/25/2019 1237 Full Code [id number]  [name], MD Inpatient   11/20/2019 0150 11/24/2019 1338 DNR [id number]  Rise Patience, MD Inpatient   04/02/2015 1725 04/05/2015 1557 DNR [id number]  Karen Kitchens Inpatient   09/14/2014 0952 09/15/2014 1430 Full Code [id number]  Leonie Man, MD Inpatient  09/09/2014 0331 09/14/2014 0952 Full Code [id number]  Lamar Sprinkles, MD Inpatient   10/20/2012 1922 10/22/2012 1421 DNR [id number]  Marzetta Board, MD ED   Advance Care Planning Activity        IV Access:    Peripheral IV   Procedures and diagnostic studies:   No results found.   Medical Consultants:    None.  Anti-Infectives:   none  Subjective:    Madelin Headings he relates his breathing is not better, he relates that the time they try to bring the oxygen down he does not like it he likes to feel the sensation of the oxygen  through his nose.    Objective:    Vitals:   06/24/20 0354 06/24/20 0457 06/24/20 0839 06/24/20 0839  BP:   (!) 147/72 (!) 147/72  Pulse:  85 85 85  Resp:  20    Temp:      TempSrc:      SpO2:  98%  100%  Weight: 122.9 kg      SpO2: 100 % O2 Flow Rate (L/min): 10 L/min   Intake/Output Summary (Last 24 hours) at 06/24/2020 0936 Last data filed at 06/24/2020 0639 Gross per 24 hour  Intake 998 ml  Output 1825 ml  Net -827 ml   Filed Weights   06/21/20 0348 06/23/20 0653 06/24/20 0354  Weight: 124.6 kg 124.5 kg 122.9 kg    Exam: General exam: In no acute distress. Respiratory system: Good air movement and clear to auscultation. Cardiovascular system: S1 & S2 heard, RRR. No JVD. Gastrointestinal system: Abdomen is nondistended, soft and nontender.  Extremities: 2+ edema. Skin: No rashes, lesions or ulcers Psychiatry: Judgement and insight appear normal. Mood & affect appropriate.   Data Reviewed:    Labs: Basic Metabolic Panel: Recent Labs  Lab 06/20/20 0500 06/20/20 0500 06/21/20 0506 06/21/20 0506 06/22/20 0204 06/22/20 0204 06/23/20 0450 06/24/20 0314  NA 143  --  141  --  139  --  140 136  K 3.6   < > 3.8   < > 3.6   < > 3.3* 3.7  CL 107  --  104  --  101  --  103 98  CO2 23  --  25  --  25  --  25 26  GLUCOSE 133*  --  227*  --  210*  --  57* 171*  BUN 31*  --  39*  --  38*  --  35* 36*  CREATININE 1.65*  --  1.72*  --  1.65*  --  1.51* 1.76*  CALCIUM 8.7*  --  8.8*  --  8.9  --  8.8* 8.7*   < > = values in this interval not displayed.   GFR Estimated Creatinine Clearance: 48.3 mL/min (A) (by C-G formula based on SCr of 1.76 mg/dL (H)). Liver Function Tests: Recent Labs  Lab 06/13/2020 2033  AST 21  ALT 13  ALKPHOS 87  BILITOT 1.0  PROT 6.3*  ALBUMIN 3.6   No results for input(s): LIPASE, AMYLASE in the last 168 hours. No results for input(s): AMMONIA in the last 168 hours. Coagulation profile Recent Labs  Lab 06/09/2020 2033  INR 1.4*    COVID-19 Labs  No results for input(s): DDIMER, FERRITIN, LDH, CRP in the last 72 hours.  Lab Results  Component Value Date   SARSCOV2NAA NEGATIVE 06/04/2020   [place] NEGATIVE 05/05/2020   [place] NEGATIVE 11/19/2019    CBC: Recent Labs  Lab 06/07/2020 1730  WBC 8.6  HGB 13.0  HCT 40.7  MCV 101.5*  PLT 120*   Cardiac Enzymes: No results for input(s): CKTOTAL, CKMB, CKMBINDEX, TROPONINI in the last 168 hours. BNP (last 3 results) No results for input(s): PROBNP in the last 8760 hours. CBG: Recent Labs  Lab 06/23/20 0831 06/23/20 1226 06/23/20 1633 06/23/20 2215 06/24/20 0610  GLUCAP 140* 141* 117* 174* 192*   D-Dimer: No results for input(s): DDIMER in the last 72 hours. Hgb A1c: No results for input(s): HGBA1C in the last 72 hours. Lipid Profile: No results for input(s): CHOL, HDL, LDLCALC, TRIG, CHOLHDL, LDLDIRECT in the last 72 hours. Thyroid function studies: No results for input(s): TSH, T4TOTAL, T3FREE, THYROIDAB in the last 72 hours.  Invalid input(s): FREET3 Anemia work up: No results for input(s): VITAMINB12, FOLATE, FERRITIN, TIBC, IRON, RETICCTPCT in the last 72 hours. Sepsis Labs: Recent Labs  Lab 06/07/2020 1730 07/01/2020 2033  WBC 8.6  --   LATICACIDVEN  --  1.0   Microbiology Recent Results (from the past 240 hour(s))  Respiratory Panel by RT PCR (Flu A&B, Covid) - Nasopharyngeal Swab     Status: None   Collection Time: 06/22/2020  8:33 PM   Specimen: Nasopharyngeal Swab  Result Value Ref Range Status   SARS Coronavirus 2 by RT PCR NEGATIVE NEGATIVE Final    Comment: (NOTE) SARS-CoV-2 target nucleic acids are NOT DETECTED.  The SARS-CoV-2 RNA is generally detectable in upper respiratoy specimens during the acute phase of infection. The lowest concentration of SARS-CoV-2 viral copies this assay can detect is 131 copies/mL. A negative result does not preclude SARS-Cov-2 infection and should not be used as the sole basis for  treatment or other patient management decisions. A negative result may occur with  improper specimen collection/handling, submission of specimen other than nasopharyngeal swab, presence of viral mutation(s) within the areas targeted by this assay, and inadequate number of viral copies (<131 copies/mL). A negative result must be combined with clinical observations, patient history, and epidemiological information. The expected result is Negative.  Fact Sheet for Patients:  PinkCheek.be  Fact Sheet for Healthcare Providers:  GravelBags.it  This test is no t yet approved or cleared by the [place] FDA and  has been authorized for detection and/or diagnosis of SARS-CoV-2 by FDA under an Emergency Use Authorization (EUA). This EUA will remain  in effect (meaning this test can be used) for the duration of the COVID-19 declaration under Section 564(b)(1) of the Act, 21 U.S.C. section 360bbb-3(b)(1), unless the authorization is terminated or revoked sooner.     Influenza A by PCR NEGATIVE NEGATIVE Final   Influenza B by PCR NEGATIVE NEGATIVE Final    Comment: (NOTE) The Xpert Xpress SARS-CoV-2/FLU/RSV assay is intended as an aid in  the diagnosis of influenza from Nasopharyngeal swab specimens and  should not be used as a sole basis for treatment. Nasal washings and  aspirates are unacceptable for Xpert Xpress SARS-CoV-2/FLU/RSV  testing.  Fact Sheet for Patients: PinkCheek.be  Fact Sheet for Healthcare Providers: GravelBags.it  This test is not yet approved or cleared by the [place] FDA and  has been authorized for detection and/or diagnosis of SARS-CoV-2 by  FDA under an Emergency Use Authorization (EUA). This EUA will remain  in effect (meaning this test can be used) for the duration of the  Covid-19 declaration under Section 564(b)(1) of the Act, 21   U.S.C. section 360bbb-3(b)(1), unless the authorization is  terminated or revoked. Performed at [facility]  Lab, [address], [place], [place] [address]      Medications:   . apixaban  5 mg Oral BID  . carvedilol  3.125 mg Oral BID WC  . cholestyramine  4 g Oral Q1400  . clopidogrel  75 mg Oral Daily  . dapagliflozin propanediol  10 mg Oral Daily  . FLUoxetine  40 mg Oral QHS  . hydrALAZINE  25 mg Oral Q8H  . insulin aspart  0-15 Units Subcutaneous TID WC  . insulin aspart  0-5 Units Subcutaneous QHS  . insulin regular human CONCENTRATED  50 Units Subcutaneous BID WC  . isosorbide mononitrate  30 mg Oral Daily  . rosuvastatin  20 mg Oral QHS  . sodium chloride flush  3 mL Intravenous Q12H   Continuous Infusions: . sodium chloride        LOS: 5 days   Charlynne Cousins  [facility]  06/24/2020, 9:36 AM

## 2020-06-24 NOTE — Progress Notes (Signed)
Patient verbalizes he has a headache at 1200 and was feeling hot and wanted a cup of water. Provided the patient with prn tylenol medication and a cup of water. Patient's wife calls the nurses station at 1300 relaying that the patient is feeling hot. Oral temp is 101.6. Paged MD to notify. Placed two ice packs onto the patient and placed fan on the bedside table facing the patient and thermostat on lowest setting.

## 2020-06-24 NOTE — Progress Notes (Signed)
Progress Note  Patient Name: Vernon Briggs Date of Encounter: 06/24/2020  [facility] Cardiologist: Fransico Him, MD   Subjective   No issues with breathing.  Has all his CPAP device.  Feet are dangling from the bed and he is lying catty corner.  Now having issues with diarrhea.  He has been having this at home and also had a during the prior admission.  Inpatient Medications    Scheduled Meds: . apixaban  5 mg Oral BID  . carvedilol  3.125 mg Oral BID WC  . cholestyramine  4 g Oral Q1400  . clopidogrel  75 mg Oral Daily  . dapagliflozin propanediol  10 mg Oral Daily  . FLUoxetine  40 mg Oral QHS  . hydrALAZINE  25 mg Oral Q8H  . insulin aspart  0-15 Units Subcutaneous TID WC  . insulin aspart  0-5 Units Subcutaneous QHS  . insulin aspart  6 Units Subcutaneous TID WC  . insulin regular human CONCENTRATED  50 Units Subcutaneous BID WC  . isosorbide mononitrate  30 mg Oral Daily  . rosuvastatin  20 mg Oral QHS  . sodium chloride flush  3 mL Intravenous Q12H   Continuous Infusions: . sodium chloride     PRN Meds: sodium chloride, acetaminophen, ipratropium-albuterol, loperamide, ondansetron (ZOFRAN) IV, sodium chloride flush   Vital Signs    Vitals:   06/24/20 0457 06/24/20 0839 06/24/20 0839 06/24/20 1105  BP:  (!) 147/72 (!) 147/72 (!) 145/69  Pulse: 85 85 85 85  Resp: 20   20  Temp:    100 F (37.8 C)  TempSrc:    Oral  SpO2: 98%  100% 99%  Weight:        Intake/Output Summary (Last 24 hours) at 06/24/2020 1247 Last data filed at 06/24/2020 1111 Gross per 24 hour  Intake 1418 ml  Output 1300 ml  Net 118 ml   Last 3 Weights 06/24/2020 06/23/2020 06/21/2020  Weight (lbs) 270 lb 15.1 oz 274 lb 8 oz 274 lb 11.2 oz  Weight (kg) 122.9 kg 124.512 kg 124.603 kg      Telemetry    Paced rhythm- Personally Reviewed  ECG    A new tracing is not performed- Personally Reviewed  Physical Exam  Morbid obesity GEN: No acute distress.   Neck: No  JVD Cardiac: RRR, no murmurs, rubs, or gallops.  Respiratory: Clear to auscultation bilaterally. GI: Soft, nontender, non-distended  MS:  1+ edema from the dorsum of the feet to the mid shin no deformity. Neuro:   Somewhat lethargic Psych: Normal affect   Labs    High Sensitivity Troponin:   Recent Labs  Lab 06/16/2020 1730 06/05/2020 2033  TROPONINIHS 73* 66*      Chemistry Recent Labs  Lab 07/02/2020 2033 06/20/20 0500 06/22/20 0204 06/23/20 0450 06/24/20 0314  NA  --    < > 139 140 136  K  --    < > 3.6 3.3* 3.7  CL  --    < > 101 103 98  CO2  --    < > _0 GLUCOSE  --    < > 210* 57* 171*  BUN  --    < > 38* 35* 36*  CREATININE  --    < > 1.65* 1.51* 1.76*  CALCIUM  --    < > 8.9 8.8* 8.7*  PROT 6.3*  --   --   --   --   ALBUMIN 3.6  --   --   --   --  AST 21  --   --   --   --   ALT 13  --   --   --   --   ALKPHOS 87  --   --   --   --   BILITOT 1.0  --   --   --   --   GFRNONAA  --    < > 40* 44* 37*  ANIONGAP  --    < > _0 < > = values in this interval not displayed.     Hematology Recent Labs  Lab 06/11/2020 1730  WBC 8.6  RBC 4.01*  HGB 13.0  HCT 40.7  MCV 101.5*  MCH 32.4  MCHC 31.9  RDW 14.3  PLT 120*    BNP Recent Labs  Lab 06/25/2020 2033  BNP 330.8*     DDimer No results for input(s): DDIMER in the last 168 hours.   Radiology    No results found.  Cardiac Studies   No new data  Patient Profile     74 y.o. male CAD s/p CABG x4 (LIMA to LAD, SVG to [facility], SVG to OM1, and SVG to RI) in 1999 with multiple subsequent stenting (PCI x6 around 2011 at [facility], [place] to LCX in 2016, and most recently PCI/DES to left main in 11/2019), chronic combined CHF/ischemic cardiomyopathy with EF of 25-30% on Echo in 11/2018, s/p Biotronik ICD in 2013, paroxsymal atrial fibrillation on Eliquis, prior left frontal CVA, hypertension, hyperlipidemia, type 2 diabetes mellitus, CKD stage III, obstructive sleep apnea on CPAP,  anxiety/depression, and PTSDwho is admitted with volume overload.  Assessment & Plan    1. Acute on chronic combined systolic and diastolic heart failure: Because of the increase in creatinine, diuretic therapy is being paused today.  Will uptitrate hydralazine and long-acting nitrates slightly.  Will decrease furosemide to 80 mg IV once today. 2. CKD IIIB: Decrease the intensity of diuretic therapy.  Follow-up be met in a.m. 3. Primary hypertension: Hydralazine and Imdur have been slightly increased.     For questions or updates, please contact [place] Please consult [email or web address] for contact info under        Signed, Sinclair Grooms, MD  06/24/2020, 12:47 PM

## 2020-06-24 NOTE — Plan of Care (Signed)
°  Problem: Clinical Measurements: °Goal: Respiratory complications will improve °Outcome: Progressing °Goal: Cardiovascular complication will be avoided °Outcome: Progressing °  °Problem: Activity: °Goal: Risk for activity intolerance will decrease °Outcome: Progressing °  °

## 2020-06-24 NOTE — Care Management Important Message (Signed)
Important Message  Patient Details  Name: Vernon Briggs MRN: [id number] Date of Birth: [birth year]   Medicare Important Message Given:  Yes     Vernon Briggs 06/24/2020, 8:39 AM

## 2020-06-24 NOTE — Progress Notes (Signed)
Physical Therapy Treatment Patient Details Name: Vernon Briggs MRN: [id number] DOB: 1946/07/25 Today's Date: 06/24/2020    History of Present Illness 74 year old man 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 presented with worsening shortness of breath and swelling.  Admitted for acute on chronic systolic CHF    PT Comments    Pt making slow progress towards physical therapy goals, limited this session by diarrhea, self limiting behaviors, and fatigue. Pt requiring moderate assist for transfers to and from bedside commode; able to participate in a few seated exercises before requesting to lie back down. Pt/pt wife report pt wife cannot provide current level of assist needed for patient. Updated d/c recommendations to SNF.     Follow Up Recommendations  SNF     Equipment Recommendations  None recommended by PT    Recommendations for Other Services       Precautions / Restrictions Precautions Precautions: Fall Restrictions Weight Bearing Restrictions: No    Mobility  Bed Mobility Overal bed mobility: Needs Assistance Bed Mobility: Sit to Supine       Sit to supine: Mod assist   General bed mobility comments: ModA for LE negotiation back into bed  Transfers Overall transfer level: Needs assistance Equipment used: Rolling walker (2 wheeled) Transfers: Sit to/from [facility] Sit to Stand: Mod assist Stand pivot transfers: Mod assist       General transfer comment: ModA to rise to standing and pivot from [facility] to bed, cues for sequencing  Ambulation/Gait             General Gait Details: deferred by patient   Stairs             Wheelchair Mobility    Modified Rankin (Stroke Patients Only)       Balance Overall balance assessment: Needs assistance Sitting-balance support: Feet supported;No upper extremity supported Sitting balance-Leahy  Scale: Fair     Standing balance support: Bilateral upper extremity supported;During functional activity Standing balance-Leahy Scale: Poor                              Cognition Arousal/Alertness: Awake/alert Behavior During Therapy: Flat affect Overall Cognitive Status: Impaired/Different from baseline Area of Impairment: Problem solving;Safety/judgement                         Safety/Judgement: Decreased awareness of safety;Decreased awareness of deficits   Problem Solving: Difficulty sequencing;Requires verbal cues General Comments: Pt very demanding and self limiting, often using one word responses to make a request i.e. stating, "pillow."       Exercises General Exercises - Lower Extremity Long Arc Quad: Both;5 reps;Seated Hip Flexion/Marching: Both;5 reps;Seated    General Comments        Pertinent Vitals/Pain Pain Assessment: Faces Faces Pain Scale: Hurts little more Pain Location: L knee Pain Descriptors / Indicators: Grimacing;Sore;Aching Pain Intervention(s): Monitored during session    Home Living                      Prior Function            PT Goals (current goals can now be found in the care plan section) Acute Rehab PT Goals Patient Stated Goal: feel better PT Goal Formulation: With patient/family Time For Goal Achievement: 07/05/20 Potential to Achieve Goals: Good Progress towards PT goals:  Not progressing toward goals - comment    Frequency    Min 3X/week      PT Plan Discharge plan needs to be updated    Co-evaluation              AM-PAC PT "6 Clicks" Mobility   Outcome Measure  Help needed turning from your back to your side while in a flat bed without using bedrails?: A Little Help needed moving from lying on your back to sitting on the side of a flat bed without using bedrails?: A Lot Help needed moving to and from a bed to a chair (including a wheelchair)?: A Lot Help needed standing up  from a chair using your arms (e.g., wheelchair or bedside chair)?: A Lot Help needed to walk in hospital room?: A Lot Help needed climbing 3-5 steps with a railing? : Total 6 Click Score: 12    End of Session Equipment Utilized During Treatment: Oxygen Activity Tolerance: Patient limited by fatigue Patient left: in bed;with call bell/phone within reach;with family/visitor present Nurse Communication: Mobility status PT Visit Diagnosis: Unsteadiness on feet (R26.81);Other abnormalities of gait and mobility (R26.89);Muscle weakness (generalized) (M62.81);History of falling (Z91.81)     Time: 8937-3428 PT Time Calculation (min) (ACUTE ONLY): 15 min  Charges:  $Therapeutic Activity: 8-22 mins                     Vernon Briggs, PT, DPT Acute Rehabilitation Services Pager [phone] Office [phone]    Deno Etienne 06/24/2020, 2:26 PM

## 2020-06-25 DIAGNOSIS — E785 Hyperlipidemia, unspecified: Secondary | ICD-10-CM

## 2020-06-25 DIAGNOSIS — I5023 Acute on chronic systolic (congestive) heart failure: Secondary | ICD-10-CM | POA: Diagnosis not present

## 2020-06-25 DIAGNOSIS — J9601 Acute respiratory failure with hypoxia: Secondary | ICD-10-CM | POA: Diagnosis not present

## 2020-06-25 DIAGNOSIS — Z95810 Presence of automatic (implantable) cardiac defibrillator: Secondary | ICD-10-CM | POA: Diagnosis not present

## 2020-06-25 DIAGNOSIS — N183 Chronic kidney disease, stage 3 unspecified: Secondary | ICD-10-CM

## 2020-06-25 DIAGNOSIS — I10 Essential (primary) hypertension: Secondary | ICD-10-CM | POA: Diagnosis not present

## 2020-06-25 DIAGNOSIS — N1832 Chronic kidney disease, stage 3b: Secondary | ICD-10-CM | POA: Diagnosis not present

## 2020-06-25 LAB — GLUCOSE, CAPILLARY
Glucose-Capillary: 107 mg/dL — ABNORMAL HIGH (ref 70–99)
Glucose-Capillary: 207 mg/dL — ABNORMAL HIGH (ref 70–99)
Glucose-Capillary: 184 mg/dL — ABNORMAL HIGH (ref 70–99)
Glucose-Capillary: 198 mg/dL — ABNORMAL HIGH (ref 70–99)

## 2020-06-25 LAB — BASIC METABOLIC PANEL
Anion gap: 13 (ref 5–15)
BUN: 42 mg/dL — ABNORMAL HIGH (ref 8–23)
CO2: 25 mmol/L (ref 22–32)
Calcium: 8.6 mg/dL — ABNORMAL LOW (ref 8.9–10.3)
Chloride: 98 mmol/L (ref 98–111)
Creatinine, Ser: 1.63 mg/dL — ABNORMAL HIGH (ref 0.61–1.24)
GFR, Estimated: 43 mL/min — ABNORMAL LOW (ref >60)
Glucose, Bld: 204 mg/dL — ABNORMAL HIGH (ref 70–99)
Potassium: 3.4 mmol/L — ABNORMAL LOW (ref 3.5–5.1)
Sodium: 136 mmol/L (ref 135–145)

## 2020-06-25 MED ORDER — GERHARDT'S BUTT CREAM
TOPICAL_CREAM | Freq: Three times a day (TID) | CUTANEOUS | Status: DC
  Administered 2020-06-25: 1 via TOPICAL
  Filled 2020-06-25: qty 1

## 2020-06-25 NOTE — Progress Notes (Addendum)
Patient IV site infiltrated/phlebitis on RN assessment, site is red, warm and painful to touch. IV remove, tegaderm applied to site. Will continue to monitor site. MD aware.

## 2020-06-25 NOTE — Progress Notes (Signed)
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  Admitted for acute on chronic systolic CHF   OT comments  Patient limited by stools this date.  OT assisted with hygiene bed level with patient rolling side to side.  Able to sit patient on edge of bed, he assisted with changing gown, unable to assist with changing socks.  Patient stating he needed to move more, but declined mobility.  Lab entering the room and patient returned to bed.  Barriers to independence: generalized weakness, fair sit balance, decreased activity tolerance and declines to cognition from baseline, all contribute to decreased independence with ADL and toilet/functional mobility.  Patient will continue to be follow in the acute setting.  SNF has been recommended unless his mobility improves.    Follow Up Recommendations  SNF;Supervision/Assistance - 24 hour    Equipment Recommendations  3 in 1 bedside commode;Tub/shower bench    Recommendations for Other Services      Precautions / Restrictions Precautions Precautions: Fall Restrictions Weight Bearing Restrictions: No Other Position/Activity Restrictions: loose stools       Mobility Bed Mobility   Bed Mobility: Rolling Rolling: Modified independent (Device/Increase time)                                  Balance Overall balance assessment: Needs assistance Sitting-balance support: Feet supported;No upper extremity supported Sitting balance-Leahy Scale: Fair                                     ADL either performed or assessed with clinical judgement   ADL                   Upper Body Dressing : Minimal  assistance;Sitting           Toileting- Clothing Manipulation and Hygiene: Moderate assistance;Bed level                                 Cognition Arousal/Alertness: Awake/alert Behavior During Therapy: Anxious Overall Cognitive Status: Impaired/Different from baseline                               Problem Solving: Slow processing;Requires verbal cues          Exercises      Prior Functioning/Environment              Frequency  Min 2X/week        Progress Toward Goals  OT Goals(current goals can now be found in the care plan section)  Progress towards OT goals: Progressing toward goals  Acute Rehab OT Goals Patient Stated Goal: I need to move more OT Goal Formulation: With patient Time For Goal Achievement: 07/05/20 Potential to Achieve Goals: [place] Discharge plan needs to be updated    Co-evaluation                 AM-PAC OT "6 Clicks" Daily Activity     Outcome Measure  Help from another person eating meals?: A Little Help from another person taking care of personal grooming?: A Little Help from another person toileting, which includes using toliet, bedpan, or urinal?: A Lot Help from another person bathing (including washing, rinsing, drying)?: A Lot Help from another person to put on and taking off regular upper body clothing?: A Little Help from another person to put on and taking off regular lower body clothing?: A Lot 6 Click Score: 15    End of Session    OT Visit Diagnosis: Unsteadiness on feet (R26.81);Muscle weakness (generalized) (M62.81);Pain;History of falling (Z91.81);Hemiplegia and hemiparesis   Activity Tolerance Patient limited by fatigue   Patient Left with call bell/phone within reach;in bed;Other (comment) (lab in the room for blood draw)   Nurse Communication          Time: 0277-4128 OT Time Calculation (min): 19 min  Charges:    06/25/2020  Denice Paradise, OTR/L  Acute  Rehabilitation Services  Office:  [phone]    Metta Clines 06/25/2020, 10:44 AM

## 2020-06-25 NOTE — Progress Notes (Signed)
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  Admitted for acute on chronic systolic CHF    PT Comments    Pt very self limiting this session.  He has no reason given to not participate with OOB activities just reports he is tired.  Deferred activity to bed level to promote strengthening of B LEs to carryover with functional acitivities he required active assisted ROM to complete exercises.  Continue to recommend snf placement at this time.     Follow Up Recommendations  SNF     Equipment Recommendations  None recommended by PT    Recommendations for Other Services       Precautions / Restrictions Precautions Precautions: Fall Precaution Comments: h/o falls at home Restrictions Weight Bearing Restrictions: No    Mobility  Bed Mobility Overal bed mobility: Needs Assistance Bed Mobility: Rolling Rolling: Supervision         General bed mobility comments: Multiple bouts of rolling to R and L to change pads and scoot in supine to HOB.  Transfers Overall transfer level:  (Refused OOB.)                  Ambulation/Gait                 Stairs             Wheelchair Mobility    Modified Rankin (Stroke Patients Only)       Balance                                            Cognition Arousal/Alertness: Awake/alert Behavior During Therapy: Anxious Overall Cognitive Status: Impaired/Different from baseline Area of Impairment: Problem solving;Safety/judgement;Orientation                 Orientation Level: Disoriented to;Time;Situation       Safety/Judgement: Decreased awareness of safety;Decreased awareness  of deficits   Problem Solving: Slow processing;Requires verbal cues General Comments: Pt very self limiting, unclear if he is slow to process or if this is behavioral.      Exercises General Exercises - Lower Extremity Ankle Circles/Pumps: AROM;Both;10 reps;Supine Heel Slides: AROM;Both;10 reps;Supine;AAROM Hip ABduction/ADduction: AROM;Both;10 reps;Supine Straight Leg Raises: AROM;Both;10 reps;Supine;AAROM    General Comments        Pertinent Vitals/Pain Pain Assessment: No/denies pain (reports just feeling tired.)    Home Living                      Prior Function            PT Goals (current goals can now be found in the care plan section) Acute Rehab PT Goals Potential to Achieve Goals: Good Progress towards PT goals: Progressing toward goals    Frequency    Min 3X/week      PT Plan Current plan remains appropriate    Co-evaluation              AM-PAC PT "6 Clicks" Mobility   Outcome Measure  Help needed turning from your back to your side while in a flat bed without using bedrails?: A Little  Help needed moving from lying on your back to sitting on the side of a flat bed without using bedrails?: A Lot Help needed moving to and from a bed to a chair (including a wheelchair)?: A Lot Help needed standing up from a chair using your arms (e.g., wheelchair or bedside chair)?: A Lot Help needed to walk in hospital room?: A Lot Help needed climbing 3-5 steps with a railing? : Total 6 Click Score: 12    End of Session Equipment Utilized During Treatment: Gait belt;Oxygen Activity Tolerance: Patient limited by fatigue Patient left: in bed;with call bell/phone within reach;with family/visitor present Nurse Communication: Mobility status PT Visit Diagnosis: Unsteadiness on feet (R26.81);Other abnormalities of gait and mobility (R26.89);Muscle weakness (generalized) (M62.81);History of falling (Z91.81)     Time: 5056-9794 PT Time Calculation (min)  (ACUTE ONLY): 21 min  Charges:  $Therapeutic Exercise: 8-22 mins                     Erasmo Leventhal , PTA Acute Rehabilitation Services Pager [phone] Office [phone]     My [place] Eli Hose 06/25/2020, 3:05 PM

## 2020-06-25 NOTE — Progress Notes (Addendum)
The patient has been seen in conjunction with Reino Bellis, NP. All aspects of care have been considered and discussed. The patient has been personally interviewed, examined, and all clinical data has been reviewed.   He is having multiple clinical issues simultaneously.  Had low-grade fever yesterday.  Episodes of diarrhea are increasing.  He has diuresed some while here in the hospital.  Furosemide intravenous regimen was decreased to 80 mg once a day yesterday because of creatinine bump to 1.7.  Be met is not yet available today.  Heart failure therapy includes carvedilol 3.125 mg twice daily; isosorbide mononitrate 60 mg/day; hydralazine 25 mg every 6 hours; dapagliflozin 10 mg/day; had acute on chronic kidney injury with ACE inhibitor and spironolactone during last admission with advice from nephrology to avoid in the future.  Depending upon today's creatinine, he should receive furosemide either 80 mg once or twice.  He still appears to be volume overloaded to me, so I feel further aggressive diuresis as tolerated by kidney function and blood pressure is in order.  Complicated patient with EF 25% and multiple high risk co-morbidities. Guarded prognosis.   Progress Note  Patient Name: Vernon Briggs Date of Encounter: 06/25/2020  [facility] Cardiologist: Fransico Him, MD   Subjective   Feels terrible today, very discouraged. Sitting up in chair.   Still having episodes of diarrhea. RN just placed on RA and maintaining sats thus far  Inpatient Medications    Scheduled Meds: . apixaban  5 mg Oral BID  . carvedilol  3.125 mg Oral BID WC  . cholestyramine  4 g Oral Q1400  . clopidogrel  75 mg Oral Daily  . dapagliflozin propanediol  10 mg Oral Daily  . FLUoxetine  40 mg Oral QHS  . furosemide  80 mg Intravenous Daily  . hydrALAZINE  25 mg Oral Q6H  . insulin aspart  0-15 Units Subcutaneous TID WC  . insulin aspart  0-5 Units Subcutaneous QHS  . insulin aspart  6  Units Subcutaneous TID WC  . insulin regular human CONCENTRATED  50 Units Subcutaneous BID WC  . isosorbide mononitrate  60 mg Oral Daily  . rosuvastatin  20 mg Oral QHS  . sodium chloride flush  3 mL Intravenous Q12H   Continuous Infusions: . sodium chloride     PRN Meds: sodium chloride, acetaminophen, ipratropium-albuterol, loperamide, ondansetron (ZOFRAN) IV, sodium chloride flush   Vital Signs    Vitals:   06/25/20 0403 06/25/20 0826 06/25/20 1030 06/25/20 1035  BP: 139/75 (!) 148/63    Pulse: 73 72    Resp: 18 18    Temp:  98.3 F (36.8 C)    TempSrc:  Oral    SpO2: 98% 100% 100% 100%  Weight:        Intake/Output Summary (Last 24 hours) at 06/25/2020 1047 Last data filed at 06/25/2020 0837 Gross per 24 hour  Intake 723 ml  Output 450 ml  Net 273 ml   Last 3 Weights 06/24/2020 06/23/2020 06/21/2020  Weight (lbs) 270 lb 15.1 oz 274 lb 8 oz 274 lb 11.2 oz  Weight (kg) 122.9 kg 124.512 kg 124.603 kg      Telemetry    Vpaced - Personally Reviewed  ECG    No new tracing  Physical Exam   GEN: No acute distress.   Neck: No JVD Cardiac: RRR, no murmurs, rubs, or gallops.  Respiratory: Clear to auscultation bilaterally. GI: Soft, nontender, non-distended  MS: 1+ LE edema bilaterally; No deformity. Neuro:  Nonfocal  Psych: Normal affect   Labs    High Sensitivity Troponin:   Recent Labs  Lab 06/10/2020 1730 06/26/2020 2033  TROPONINIHS 73* 66*      Chemistry Recent Labs  Lab 06/07/2020 2033 06/20/20 0500 06/22/20 0204 06/23/20 0450 06/24/20 0314  NA  --    < > 139 140 136  K  --    < > 3.6 3.3* 3.7  CL  --    < > 101 103 98  CO2  --    < > _0 GLUCOSE  --    < > 210* 57* 171*  BUN  --    < > 38* 35* 36*  CREATININE  --    < > 1.65* 1.51* 1.76*  CALCIUM  --    < > 8.9 8.8* 8.7*  PROT 6.3*  --   --   --   --   ALBUMIN 3.6  --   --   --   --   AST 21  --   --   --   --   ALT 13  --   --   --   --   ALKPHOS 87  --   --   --   --    BILITOT 1.0  --   --   --   --   GFRNONAA  --    < > 40* 44* 37*  ANIONGAP  --    < > _1 < > = values in this interval not displayed.     Hematology Recent Labs  Lab 06/20/2020 1730 06/24/20 1350  WBC 8.6 3.2*  RBC 4.01* 3.74*  HGB 13.0 12.2*  HCT 40.7 35.9*  MCV 101.5* 96.0  MCH 32.4 32.6  MCHC 31.9 34.0  RDW 14.3 14.5  PLT 120* 79*    BNP Recent Labs  Lab 06/24/2020 2033  BNP 330.8*     DDimer No results for input(s): DDIMER in the last 168 hours.   Radiology    No results found.  Cardiac Studies   N/a   Patient Profile     74 y.o. male with PMH of CAD s/p CABG x4 (LIMA to LAD, SVG to [facility], SVG to OM1, and SVG to RI) in 1999 with multiple subsequent stenting (PCI x6 around 2011 at [facility], [place] to LCX in 2016, and most recently PCI/DES to left main in 11/2019), chronic combined CHF/ischemic cardiomyopathy with EF of 25-30% on Echo in 11/2018, s/p Biotronik ICD in 2013, paroxsymal atrial fibrillation on Eliquis, prior left frontal CVA, hypertension, hyperlipidemia, type 2 diabetes mellitus, CKD stage III, obstructive sleep apnea on CPAP, anxiety/depression, and PTSDwho was admitted with volume overload.   Assessment & Plan    1. Acute on Chronic combined HF: Net -3.2L, weight trending down 281>>270lbs. Overall volume status has improved.  -- currently on IV lasix 66m daily. BMET pending  2. Demand Ischemia: Long history of CAD.  -- High-sensitivity troponin minimally elevated and flat at 73 >> 66.Not consistent with ACS. Likely demand ischemia in setting of CHF.No chest pain -- Continue Plavix 724m Not on Aspirin due to need for Eliquis. Continue Coreg 3.1254mwice daily, Imdur 5m70mily, and Crestor 20mg50mly.  3. Paroxysmal Atrial Fibrillation: Maintaining sinus rhythm with ventricular pacing. -- Continue Coreg and Eliquis 5mg t57me daily.  4. Hypertension: intermittently elevated. -- Continue Coregand Imdur along with Hydralazine.    5. Hyperlipidemia: -- Continue Crestor 20mg d21m.  6. Type 2 Diabetes Mellitus: On concentrated U-500 insulin at home. -- Farxiga 24m daily added this admission -- Management per primary team.  7. CKD Stage III: Creatinine 1.7 yesterday. BMET pending -- Continue to monitor closely.  8. OSA: on CPAP  9. Diarrhea: reports having episode prior to admission but seem worse now. Management per primary  For questions or updates, please contact CLos AngelesPlease consult [email or web address] for contact info under        Signed, LReino Bellis NP  06/25/2020, 10:47 AM

## 2020-06-25 NOTE — Progress Notes (Signed)
  Mobility Specialist Criteria Algorithm Info.  SATURATION QUALIFICATIONS: (This note is used to comply with regulatory documentation for home oxygen)  Patient Saturations on Room Air at Rest = 100%  Patient Saturations on Room Air while Ambulating = 97%  Patient Saturations on 0 Liters of oxygen while Ambulating = n/a%  Please briefly explain why patient needs home oxygen:  Mobility Team:  [facility] elevated:Self regulated Activity: Ambulated in room;Transferred:  Bed to chair Range of motion: Active;All extremities Level of assistance: Minimal assist, patient does 75% or more (Min G ambulating) Assistive device: Front wheel walker Minutes sitting in chair:  Minutes stood: 2 minutes Minutes ambulated: 2 minutes Distance ambulated (ft): 10 ft Mobility response: Tolerated well Bed Position: Chair  Received pt lying supine in bed, pleasant and agreeable to participate in mobility this morning. Complained of 10/10 pain in his buttock/sacral area. RN aware of bottom being bright red and irritated. Although he's in pain, he's motivated to get up, and was agreeable to participate stating he needed to get out of bed so he can go home. Pt got to the EOB and stood with minimal HHA and cues for hand placement on RW. Ambulated in room min guard for approximately 10 ft to recliner chair. Overall pt tolerated ambulation well and is now seated in recliner chair with all needs met.   06/25/2020 10:20 AM

## 2020-06-25 NOTE — Progress Notes (Signed)
[facility] PROGRESS NOTE    Progress Note  Vernon Briggs  VVO:[id number] DOB: Apr 04, 1946 DOA: 06/07/2020 PCP: [name], MD     Brief Narrative:   Vernon Briggs is an 74 y.o. 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  Assessment/Plan:   Acute respiratory failure with hypoxia secondarily to acute on chronic systolic CHF (congestive heart failure): In the context of recently decreased torsemide and discontinuation of Aldactone. Cardiology has been consulted, started on IV Lasix on admission, today is about 3 L negative his diuresis slowing down basic metabolic panel is pending this morning. We will continue Crestor, Plavix, Imdur, beta-blocker, Farxiga and hydralazine. On his previous admission it was recommended not to start an ACE inhibitor Aldactone. Continue to try to wean patient off oxygen. Further management per cardiology. He spiked a mild temperature on 06/23/2020, it was likely due to thrombophlebitis, the IV site was discontinued the swelling has come down he has no leukocytosis and he has been afebrile.  Elevated troponin's: Continue Plavix, Eliquis, Coreg, indoor and Crestor. Likely demand ischemia in the setting of acute systolic heart failure.  Diabetes mellitus type 2 with complications: Continue long-acting insulin U5 100+ sliding scale.  Episode of hypoglycemia this morning decreased U5 100.  Chronic kidney disease stage IIIb: Ending appears to be at baseline continue to monitor closely with diuresis. Basic metabolic panel is pending.  Asthma atrial fibrillation: Paced rhythm, on Coreg and apixaban.  Obstructive sleep apnea: Continue CPAP.  DVT prophylaxis: Apixiban Family Communication:none Status is: Inpatient  Remains inpatient appropriate  because:Hemodynamically unstable   Dispo: The patient is from: Home              Anticipated d/c is to: Home              Anticipated d/c date is: 1 days              Patient currently is not medically stable to d/c.   Code Status:     Code Status Orders  (From admission, onward)         Start     Ordered   06/30/2020 2152  Do not attempt resuscitation (DNR)  Continuous       Question Answer Comment  In the event of cardiac or respiratory ARREST Do not call a "code blue"   In the event of cardiac or respiratory ARREST Do not perform Intubation, CPR, defibrillation or ACLS   In the event of cardiac or respiratory ARREST Use medication by any route, position, wound care, and other measures to relive pain and suffering. May use oxygen, suction and manual treatment of airway obstruction as needed for comfort.      06/14/2020 2152        Code Status History    Date Active Date Inactive Code Status Order ID Comments User Context   05/05/2020 2342 05/10/2020 2144 DNR [id number]  Etta Quill, DO ED   11/25/2019 1237 11/26/2019 2031 DNR [id number]  Oswald Hillock, MD Inpatient   11/24/2019 1339 11/25/2019 1237 Full Code [id number]  [name], MD Inpatient   11/20/2019 0150 11/24/2019 1338 DNR [id number]  Rise Patience, MD Inpatient   04/02/2015 1725 04/05/2015 1557 DNR [id number]  Karen Kitchens Inpatient   09/14/2014 0952 09/15/2014 1430 Full Code [id number]  Leonie Man, MD Inpatient  09/09/2014 0331 09/14/2014 0952 Full Code [id number]  Lamar Sprinkles, MD Inpatient   10/20/2012 1922 10/22/2012 1421 DNR [id number]  Marzetta Board, MD ED   Advance Care Planning Activity        IV Access:    Peripheral IV   Procedures and diagnostic studies:   No results found.   Medical Consultants:    None.  Anti-Infectives:   none  Subjective:    Vernon Briggs he relates his breathing is not better.  He relates every time he is taken off the oxygen he feels short of  breath.  Objective:    Vitals:   06/24/20 2200 06/25/20 0100 06/25/20 0403 06/25/20 0826  BP: 135/71 138/66 139/75 (!) 148/63  Pulse: 75 72 73 72  Resp: (!) 22 (!) 22 18 18   Temp: 98.3 F (36.8 C) 98 F (36.7 C)  98.3 F (36.8 C)  TempSrc: Oral Oral  Oral  SpO2: 98% 95% 98% 100%  Weight:       SpO2: 100 % O2 Flow Rate (L/min): 3 L/min   Intake/Output Summary (Last 24 hours) at 06/25/2020 0849 Last data filed at 06/25/2020 0837 Gross per 24 hour  Intake 1143 ml  Output 450 ml  Net 693 ml   Filed Weights   06/21/20 0348 06/23/20 0653 06/24/20 0354  Weight: 124.6 kg 124.5 kg 122.9 kg    Exam: General exam: In no acute distress. Respiratory system: Good air movement and clear to auscultation. Cardiovascular system: S1 & S2 heard, RRR. No JVD. Gastrointestinal system: Abdomen is nondistended, soft and nontender.  Extremities: 2+ lower extremity edema. Skin: No rashes, lesions or ulcers  Data Reviewed:    Labs: Basic Metabolic Panel: Recent Labs  Lab 06/20/20 0500 06/20/20 0500 06/21/20 0506 06/21/20 0506 06/22/20 0204 06/22/20 0204 06/23/20 0450 06/24/20 0314  NA 143  --  141  --  139  --  140 136  K 3.6   < > 3.8   < > 3.6   < > 3.3* 3.7  CL 107  --  104  --  101  --  103 98  CO2 23  --  25  --  25  --  25 26  GLUCOSE 133*  --  227*  --  210*  --  57* 171*  BUN 31*  --  39*  --  38*  --  35* 36*  CREATININE 1.65*  --  1.72*  --  1.65*  --  1.51* 1.76*  CALCIUM 8.7*  --  8.8*  --  8.9  --  8.8* 8.7*   < > = values in this interval not displayed.   GFR Estimated Creatinine Clearance: 48.3 mL/min (A) (by C-G formula based on SCr of 1.76 mg/dL (H)). Liver Function Tests: Recent Labs  Lab 06/14/2020 2033  AST 21  ALT 13  ALKPHOS 87  BILITOT 1.0  PROT 6.3*  ALBUMIN 3.6   No results for input(s): LIPASE, AMYLASE in the last 168 hours. No results for input(s): AMMONIA in the last 168 hours. Coagulation profile Recent Labs  Lab 06/21/2020 2033  INR  1.4*   COVID-19 Labs  No results for input(s): DDIMER, FERRITIN, LDH, CRP in the last 72 hours.  Lab Results  Component Value Date   SARSCOV2NAA NEGATIVE 06/07/2020   SARSCOV2NAA NEGATIVE 05/05/2020   [place] NEGATIVE 11/19/2019    CBC: Recent Labs  Lab 06/14/2020 1730 06/24/20 1350  WBC 8.6 3.2*  NEUTROABS  --  2.7  HGB 13.0 12.2*  HCT 40.7 35.9*  MCV 101.5* 96.0  PLT 120* 79*   Cardiac Enzymes: No results for input(s): CKTOTAL, CKMB, CKMBINDEX, TROPONINI in the last 168 hours. BNP (last 3 results) No results for input(s): PROBNP in the last 8760 hours. CBG: Recent Labs  Lab 06/24/20 0610 06/24/20 1107 06/24/20 1711 06/24/20 2039 06/25/20 0545  GLUCAP 192* 336* 169* 127* 107*   D-Dimer: No results for input(s): DDIMER in the last 72 hours. Hgb A1c: No results for input(s): HGBA1C in the last 72 hours. Lipid Profile: No results for input(s): CHOL, HDL, LDLCALC, TRIG, CHOLHDL, LDLDIRECT in the last 72 hours. Thyroid function studies: No results for input(s): TSH, T4TOTAL, T3FREE, THYROIDAB in the last 72 hours.  Invalid input(s): FREET3 Anemia work up: No results for input(s): VITAMINB12, FOLATE, FERRITIN, TIBC, IRON, RETICCTPCT in the last 72 hours. Sepsis Labs: Recent Labs  Lab 06/13/2020 1730 06/14/2020 2033 06/24/20 1350  WBC 8.6  --  3.2*  LATICACIDVEN  --  1.0  --    Microbiology Recent Results (from the past 240 hour(s))  Respiratory Panel by RT PCR (Flu A&B, Covid) - Nasopharyngeal Swab     Status: None   Collection Time: 06/23/2020  8:33 PM   Specimen: Nasopharyngeal Swab  Result Value Ref Range Status   SARS Coronavirus 2 by RT PCR NEGATIVE NEGATIVE Final    Comment: (NOTE) SARS-CoV-2 target nucleic acids are NOT DETECTED.  The SARS-CoV-2 RNA is generally detectable in upper respiratoy specimens during the acute phase of infection. The lowest concentration of SARS-CoV-2 viral copies this assay can detect is 131 copies/mL. A negative result  does not preclude SARS-Cov-2 infection and should not be used as the sole basis for treatment or other patient management decisions. A negative result may occur with  improper specimen collection/handling, submission of specimen other than nasopharyngeal swab, presence of viral mutation(s) within the areas targeted by this assay, and inadequate number of viral copies (<131 copies/mL). A negative result must be combined with clinical observations, patient history, and epidemiological information. The expected result is Negative.  Fact Sheet for Patients:  PinkCheek.be  Fact Sheet for Healthcare Providers:  GravelBags.it  This test is no t yet approved or cleared by the [place] FDA and  has been authorized for detection and/or diagnosis of SARS-CoV-2 by FDA under an Emergency Use Authorization (EUA). This EUA will remain  in effect (meaning this test can be used) for the duration of the COVID-19 declaration under Section 564(b)(1) of the Act, 21 U.S.C. section 360bbb-3(b)(1), unless the authorization is terminated or revoked sooner.     Influenza A by PCR NEGATIVE NEGATIVE Final   Influenza B by PCR NEGATIVE NEGATIVE Final    Comment: (NOTE) The Xpert Xpress SARS-CoV-2/FLU/RSV assay is intended as an aid in  the diagnosis of influenza from Nasopharyngeal swab specimens and  should not be used as a sole basis for treatment. Nasal washings and  aspirates are unacceptable for Xpert Xpress SARS-CoV-2/FLU/RSV  testing.  Fact Sheet for Patients: PinkCheek.be  Fact Sheet for Healthcare Providers: GravelBags.it  This test is not yet approved or cleared by the [place] FDA and  has been authorized for detection and/or diagnosis of SARS-CoV-2 by  FDA under an Emergency Use Authorization (EUA). This EUA will remain  in effect (meaning this test can be used) for  the duration of the  Covid-19 declaration under Section 564(b)(1) of the Act, 21  U.S.C. section 360bbb-3(b)(1), unless the authorization is  terminated or revoked. Performed at  [facility], [place] [address], [place], [place] [address]      Medications:   . apixaban  5 mg Oral BID  . carvedilol  3.125 mg Oral BID WC  . cholestyramine  4 g Oral Q1400  . clopidogrel  75 mg Oral Daily  . dapagliflozin propanediol  10 mg Oral Daily  . FLUoxetine  40 mg Oral QHS  . furosemide  80 mg Intravenous Daily  . hydrALAZINE  25 mg Oral Q6H  . insulin aspart  0-15 Units Subcutaneous TID WC  . insulin aspart  0-5 Units Subcutaneous QHS  . insulin aspart  6 Units Subcutaneous TID WC  . insulin regular human CONCENTRATED  50 Units Subcutaneous BID WC  . isosorbide mononitrate  60 mg Oral Daily  . rosuvastatin  20 mg Oral QHS  . sodium chloride flush  3 mL Intravenous Q12H   Continuous Infusions: . sodium chloride        LOS: 6 days   Charlynne Cousins  [facility]  06/25/2020, 8:49 AM

## 2020-06-26 DIAGNOSIS — I5023 Acute on chronic systolic (congestive) heart failure: Secondary | ICD-10-CM | POA: Diagnosis not present

## 2020-06-26 DIAGNOSIS — J9601 Acute respiratory failure with hypoxia: Secondary | ICD-10-CM | POA: Diagnosis not present

## 2020-06-26 DIAGNOSIS — N183 Chronic kidney disease, stage 3 unspecified: Secondary | ICD-10-CM | POA: Diagnosis not present

## 2020-06-26 LAB — CBC WITH DIFFERENTIAL/PLATELET
Abs Immature Granulocytes: 0.01 10*3/uL (ref 0.00–0.07)
Basophils Absolute: 0 10*3/uL (ref 0.0–0.1)
Basophils Relative: 0 %
Eosinophils Absolute: 0 10*3/uL (ref 0.0–0.5)
Eosinophils Relative: 1 %
HCT: 34.8 % — ABNORMAL LOW (ref 39.0–52.0)
Hemoglobin: 11.9 g/dL — ABNORMAL LOW (ref 13.0–17.0)
Immature Granulocytes: 0 %
Lymphocytes Relative: 15 %
Lymphs Abs: 0.8 10*3/uL (ref 0.7–4.0)
MCH: 32.1 pg (ref 26.0–34.0)
MCHC: 34.2 g/dL (ref 30.0–36.0)
MCV: 93.8 fL (ref 80.0–100.0)
Monocytes Absolute: 0.8 10*3/uL (ref 0.1–1.0)
Monocytes Relative: 16 %
Neutro Abs: 3.3 10*3/uL (ref 1.7–7.7)
Neutrophils Relative %: 68 %
Platelets: 80 10*3/uL — ABNORMAL LOW (ref 150–400)
RBC: 3.71 MIL/uL — ABNORMAL LOW (ref 4.22–5.81)
RDW: 14.6 % (ref 11.5–15.5)
WBC: 5 10*3/uL (ref 4.0–10.5)
nRBC: 0 % (ref 0.0–0.2)

## 2020-06-26 LAB — BASIC METABOLIC PANEL
Anion gap: 11 (ref 5–15)
BUN: 46 mg/dL — ABNORMAL HIGH (ref 8–23)
CO2: 24 mmol/L (ref 22–32)
Calcium: 8.4 mg/dL — ABNORMAL LOW (ref 8.9–10.3)
Chloride: 99 mmol/L (ref 98–111)
Creatinine, Ser: 1.71 mg/dL — ABNORMAL HIGH (ref 0.61–1.24)
GFR, Estimated: 41 mL/min — ABNORMAL LOW (ref >60)
Glucose, Bld: 183 mg/dL — ABNORMAL HIGH (ref 70–99)
Potassium: 3.3 mmol/L — ABNORMAL LOW (ref 3.5–5.1)
Sodium: 134 mmol/L — ABNORMAL LOW (ref 135–145)

## 2020-06-26 LAB — GLUCOSE, CAPILLARY
Glucose-Capillary: 176 mg/dL — ABNORMAL HIGH (ref 70–99)
Glucose-Capillary: 203 mg/dL — ABNORMAL HIGH (ref 70–99)
Glucose-Capillary: 188 mg/dL — ABNORMAL HIGH (ref 70–99)
Glucose-Capillary: 172 mg/dL — ABNORMAL HIGH (ref 70–99)
Glucose-Capillary: 99 mg/dL (ref 70–99)

## 2020-06-26 MED ORDER — POTASSIUM CHLORIDE CRYS ER 20 MEQ PO TBCR
40.0000 meq | EXTENDED_RELEASE_TABLET | Freq: Two times a day (BID) | ORAL | Status: AC
  Administered 2020-06-26 (×2): 40 meq via ORAL
  Filled 2020-06-26 (×2): qty 2

## 2020-06-26 MED ORDER — TORSEMIDE 20 MG PO TABS: 40.0000 mg | ORAL_TABLET | Freq: Every day | ORAL | Status: DC

## 2020-06-26 MED ORDER — TORSEMIDE 20 MG PO TABS: 40.0000 mg | ORAL_TABLET | Freq: Two times a day (BID) | ORAL | Status: DC

## 2020-06-26 MED ORDER — INSULIN ASPART 100 UNIT/ML ~~LOC~~ SOLN
10.0000 [IU] | Freq: Three times a day (TID) | SUBCUTANEOUS | Status: DC
  Administered 2020-06-26 – 2020-07-02 (×13): 10 [IU] via SUBCUTANEOUS

## 2020-06-26 MED ORDER — TORSEMIDE 20 MG PO TABS
40.0000 mg | ORAL_TABLET | Freq: Every day | ORAL | Status: DC
  Administered 2020-06-26 – 2020-06-30 (×5): 40 mg via ORAL
  Filled 2020-06-26 (×6): qty 2

## 2020-06-26 NOTE — Progress Notes (Signed)
Progress Note  Patient Name: Vernon Briggs Date of Encounter: 06/26/2020  [facility] Cardiologist: Fransico Him, MD   Subjective   Needs to have a bowel movement.  Reports 1-2 episodes per day.  Denies chest pain and breathing is stable.   Inpatient Medications    Scheduled Meds: . apixaban  5 mg Oral BID  . carvedilol  3.125 mg Oral BID WC  . cholestyramine  4 g Oral Q1400  . clopidogrel  75 mg Oral Daily  . dapagliflozin propanediol  10 mg Oral Daily  . FLUoxetine  40 mg Oral QHS  . furosemide  80 mg Intravenous Daily  . Gerhardt's butt cream   Topical TID  . hydrALAZINE  25 mg Oral Q6H  . insulin aspart  0-15 Units Subcutaneous TID WC  . insulin aspart  0-5 Units Subcutaneous QHS  . insulin aspart  6 Units Subcutaneous TID WC  . insulin regular human CONCENTRATED  50 Units Subcutaneous BID WC  . isosorbide mononitrate  60 mg Oral Daily  . rosuvastatin  20 mg Oral QHS  . sodium chloride flush  3 mL Intravenous Q12H   Continuous Infusions: . sodium chloride     PRN Meds: sodium chloride, acetaminophen, ipratropium-albuterol, loperamide, ondansetron (ZOFRAN) IV, sodium chloride flush   Vital Signs    Vitals:   06/26/20 0036 06/26/20 0254 06/26/20 0459 06/26/20 0844  BP: (!) 118/55  (!) 110/47 107/68  Pulse: 74  65 71  Resp: 18  18   Temp: 99.5 F (37.5 C)  97.9 F (36.6 C)   TempSrc: Oral  Oral   SpO2: 97%  98%   Weight:  121.7 kg      Intake/Output Summary (Last 24 hours) at 06/26/2020 0911 Last data filed at 06/26/2020 0032 Gross per 24 hour  Intake 120 ml  Output 1800 ml  Net -1680 ml   Last 3 Weights 06/26/2020 06/24/2020 06/23/2020  Weight (lbs) 268 lb 4.8 oz 270 lb 15.1 oz 274 lb 8 oz  Weight (kg) 121.7 kg 122.9 kg 124.512 kg      Telemetry    VP - Personally Reviewed  ECG    n/a- Personally Reviewed  Physical Exam   VS:  BP 107/68   Pulse 71   Temp 97.9 F (36.6 C) (Oral)   Resp 18   Wt 121.7 kg   SpO2 98%   BMI 36.39  kg/m  , BMI Body mass index is 36.39 kg/m. GENERAL:  Well appearing HEENT: Pupils equal round and reactive, fundi not visualized, oral mucosa unremarkable NECK:  No jugular venous distention, waveform within normal limits, carotid upstroke brisk and symmetric, no bruits LUNGS:  Poor air movement.  No crackles.  HEART:  RRR.  PMI not displaced or sustained,S1 and S2 within normal limits, no S3, no S4, no clicks, no rubs, no murmurs ABD:  Flat, positive bowel sounds normal in frequency in pitch, no bruits, no rebound, no guarding, no midline pulsatile mass, no hepatomegaly, no splenomegaly EXT:  2 plus pulses throughout, trace LE edema, no cyanosis no clubbing SKIN:  No rashes no nodules NEURO:  Cranial nerves II through XII grossly intact, motor grossly intact throughout PSYCH:  Cognitively intact, oriented to person place and time   Labs    High Sensitivity Troponin:   Recent Labs  Lab 07/01/2020 1730 06/24/2020 2033  TROPONINIHS 73* 66*      Chemistry Recent Labs  Lab 06/05/2020 2033 06/20/20 0500 06/24/20 0314 06/25/20 0957 06/26/20 0444  NA  --    < > 136 136 134*  K  --    < > 3.7 3.4* 3.3*  CL  --    < > 98 98 99  CO2  --    < > 26 25 24   GLUCOSE  --    < > 171* 204* 183*  BUN  --    < > 36* 42* 46*  CREATININE  --    < > 1.76* 1.63* 1.71*  CALCIUM  --    < > 8.7* 8.6* 8.4*  PROT 6.3*  --   --   --   --   ALBUMIN 3.6  --   --   --   --   AST 21  --   --   --   --   ALT 13  --   --   --   --   ALKPHOS 87  --   --   --   --   BILITOT 1.0  --   --   --   --   GFRNONAA  --    < > 37* 43* 41*  ANIONGAP  --    < > 12 13 11    < > = values in this interval not displayed.     Hematology Recent Labs  Lab 06/28/2020 1730 06/24/20 1350  WBC 8.6 3.2*  RBC 4.01* 3.74*  HGB 13.0 12.2*  HCT 40.7 35.9*  MCV 101.5* 96.0  MCH 32.4 32.6  MCHC 31.9 34.0  RDW 14.3 14.5  PLT 120* 79*    BNP Recent Labs  Lab 06/08/2020 2033  BNP 330.8*     DDimer No results for input(s):  DDIMER in the last 168 hours.   Radiology    No results found.  Cardiac Studies   Echo 06/20/20:  1. Left ventricular ejection fraction, by estimation, is 25 to 30%. The  left ventricle has severely decreased function. The left ventricle  demonstrates regional wall motion abnormalities (see scoring  diagram/findings for description). The left  ventricular internal cavity size was mildly dilated. Left ventricular  diastolic parameters are indeterminate.  2. Right ventricular systolic function is mildly reduced. The right  ventricular size is mildly enlarged. There is moderately elevated  pulmonary artery systolic pressure. The estimated right ventricular  systolic pressure is 85.4 mmHg.  3. Left atrial size was mildly dilated.  4. Right atrial size was moderately dilated.  5. The mitral valve is grossly normal. Moderate mitral valve  regurgitation. No evidence of mitral stenosis.  6. Tricuspid valve regurgitation is moderate to severe.  7. The aortic valve is calcified. There is mild calcification of the  aortic valve. Aortic valve regurgitation is mild. No aortic stenosis is  present.  8. Aortic dilatation noted. There is mild dilatation of the ascending  aorta, measuring 42 mm.  9. The inferior vena cava is dilated in size with <50% respiratory  variability, suggesting right atrial pressure of 15 mmHg.   Patient Profile     74 y.o. male with CAD s/p CABG LIMA to LAD, SVG to dRCA, SVG to OM1, and SVG to RI) in 1999, multiple PCI's, chronic systolic and diastolic heart failure, LVEF 25 to 30%, status post [place] ICD, paroxysmal atrial fibrillation, prior stroke, hypertension, hyperlipidemia, diabetes, CKD 3, OSA on CPAP, anxiety/depression and PTSD admitted with acute on chronic systolic congestive heart failure.  Assessment & Plan    # Acute on chronic systolic congestive heart failure: # Hypertension: BP  stable on carvedilol, Imdur, and hydralazine.  Diuresing  with lasix 80mg  IV daily.  Switched to oral torsemide at higher dose today.  He is net -1.5 L in the last 24 hours and net -5 L since admission.  # Demand ischemia: High-sensitivity troponin was minimally elevated and flat at 73--> 66. Continue carvedilol and Plavix.  #Paroxysmal atrial fibrillation: Continue Eliquis and carvedilol.  Currently VP.  # Hypertension: BP stable on carvedilol, Imdur and torsemide.  Switched to torsemide today.  Home dose increased from 20mg  to 40mg .  Will continue to monitor I/O.  # CKD 3: Started on Farxiga this admission.  Agree with this addition. Creatinine stable with diuresis.  # OSA: Continue CPAP.  # Diarrhea: Per primary.  # Fever: Thought ot be due to thrombophlebitis.  Also has diarrhea.      For questions or updates, please contact [place] Please consult [email or web address] for contact info under        Signed, Skeet Latch, MD  06/26/2020, 9:11 AM

## 2020-06-26 NOTE — Progress Notes (Signed)
Pt is using home cpap machine. Pt wants to put it on when he is ready for bed.

## 2020-06-26 NOTE — Plan of Care (Signed)
  Problem: Education: Goal: Knowledge of General Education information will improve Description: Including pain rating scale, medication(s)/side effects and non-pharmacologic comfort measures Outcome: Progressing   Problem: Pain Managment: Goal: General experience of comfort will improve Outcome: Progressing   Problem: Safety: Goal: Ability to remain free from injury will improve Outcome: Progressing   Problem: Skin Integrity: Goal: Risk for impaired skin integrity will decrease Outcome: Progressing   Problem: Cardiac: Goal: Ability to achieve and maintain adequate cardiopulmonary perfusion will improve Outcome: Progressing

## 2020-06-26 NOTE — Plan of Care (Signed)
  Problem: Clinical Measurements: Goal: Diagnostic test results will improve Outcome: Progressing Goal: Respiratory complications will improve Outcome: Progressing Note: Patient tolerating room air greater than 24 hrs   Problem: Health Behavior/Discharge Planning: Goal: Ability to manage health-related needs will improve Outcome: Not Progressing   Problem: Coping: Goal: Level of anxiety will decrease Outcome: Not Progressing   Problem: Safety: Goal: Ability to remain free from injury will improve Outcome: Not Progressing

## 2020-06-26 NOTE — Progress Notes (Signed)
[facility] PROGRESS NOTE    Progress Note  Vernon Briggs  SEG:[id number] DOB: 1946-02-02 DOA: 07/02/2020 PCP: [name], MD     Brief Narrative:   Vernon Briggs is an 74 y.o. 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  Assessment/Plan:   Acute respiratory failure with hypoxia secondarily to acute on chronic systolic CHF (congestive heart failure): In the context of recently decreased torsemide and discontinuation of Aldactone. We will continue Crestor, Plavix, Imdur, beta-blocker, Farxiga and hydralazine. On his previous admission it was recommended not to start an ACE inhibitor Aldactone. Continue to try to wean patient off oxygen. Further management per cardiology. He spiked a mild temperature on 06/23/2020, has remained afebrile since then, will go ahead and check a CBC with differential. We will go ahead and discussed with cardiology try to transition him to oral diuretic therapy, at home he was on torsemide. He relates his breathing is not better but he has been satting greater than 94% on room air. This morning he refused to take the BiPAP off.  Elevated troponin's: Continue Plavix, Eliquis, Coreg, indoor and Crestor. Likely demand ischemia in the setting of acute systolic heart failure.  Diabetes mellitus type 2 with complications: Continue long-acting insulin U5 100+ sliding scale.  Episode of hypoglycemia this morning decreased U5 100.  Chronic kidney disease stage IIIb: Ending appears to be at baseline continue to monitor closely with diuresis. Creatinine today 1.7.  See above for further details.  Asthma atrial fibrillation: Paced rhythm, on Coreg and apixaban.  Obstructive sleep apnea: Continue CPAP.  DVT prophylaxis: Apixiban Family  Communication:none Status is: Inpatient  Remains inpatient appropriate because:Hemodynamically unstable   Dispo: The patient is from: Home              Anticipated d/c is to: Skilled nursing facility              Anticipated d/c date is: 1 days              Patient currently is not medically stable to d/c.   Code Status:     Code Status Orders  (From admission, onward)         Start     Ordered   06/12/2020 2152  Do not attempt resuscitation (DNR)  Continuous       Question Answer Comment  In the event of cardiac or respiratory ARREST Do not call a "code blue"   In the event of cardiac or respiratory ARREST Do not perform Intubation, CPR, defibrillation or ACLS   In the event of cardiac or respiratory ARREST Use medication by any route, position, wound care, and other measures to relive pain and suffering. May use oxygen, suction and manual treatment of airway obstruction as needed for comfort.      06/04/2020 2152        Code Status History    Date Active Date Inactive Code Status Order ID Comments User Context   05/05/2020 [id number] 05/10/2020 2144 DNR [id number]  Etta Quill, DO ED   11/25/2019 1237 11/26/2019 2031 DNR [id number]  Oswald Hillock, MD Inpatient   11/24/2019 1339 11/25/2019 1237 Full Code [id number]  [name], MD Inpatient   11/20/2019 0150 11/24/2019 1338 DNR [id number]  Rise Patience, MD Inpatient   04/02/2015 1725 04/05/2015 1557 DNR [id number]  Melton Alar, PA-C  Inpatient   09/14/2014 0952 09/15/2014 1430 Full Code [id number]  Leonie Man, MD Inpatient   09/09/2014 0331 09/14/2014 0952 Full Code [id number]  Lamar Sprinkles, MD Inpatient   10/20/2012 1922 10/22/2012 1421 DNR [id number]  Marzetta Board, MD ED   Advance Care Planning Activity        IV Access:    Peripheral IV   Procedures and diagnostic studies:   No results found.   Medical Consultants:    None.  Anti-Infectives:   none  Subjective:    Vernon Briggs he relates his  breathing is not better but he continues to sat greater than 90% on room air  Objective:    Vitals:   06/26/20 0036 06/26/20 0254 06/26/20 0459 06/26/20 0844  BP: (!) 118/55  (!) 110/47 107/68  Pulse: 74  65 71  Resp: 18  18   Temp: 99.5 F (37.5 C)  97.9 F (36.6 C)   TempSrc: Oral  Oral   SpO2: 97%  98%   Weight:  121.7 kg     SpO2: 98 % O2 Flow Rate (L/min): 2 L/min   Intake/Output Summary (Last 24 hours) at 06/26/2020 0955 Last data filed at 06/26/2020 0032 Gross per 24 hour  Intake 120 ml  Output 1800 ml  Net -1680 ml   Filed Weights   06/23/20 0653 06/24/20 0354 06/26/20 0254  Weight: 124.5 kg 122.9 kg 121.7 kg    Exam: General exam: In no acute distress. Respiratory system: Good air movement and clear to auscultation. Cardiovascular system: S1 & S2 heard, RRR. No JVD. Gastrointestinal system: Abdomen is nondistended, soft and nontender.  Extremities: Trace edema. Skin: No rashes, lesions or ulcers Psychiatry: Judgement and insight appear normal. Mood & affect appropriate.  Data Reviewed:    Labs: Basic Metabolic Panel: Recent Labs  Lab 06/22/20 0204 06/22/20 0204 06/23/20 0450 06/23/20 0450 06/24/20 0314 06/24/20 0314 06/25/20 0957 06/26/20 0444  NA 139  --  140  --  136  --  136 134*  K 3.6   < > 3.3*   < > 3.7   < > 3.4* 3.3*  CL 101  --  103  --  98  --  98 99  CO2 25  --  25  --  26  --  25 24  GLUCOSE 210*  --  57*  --  171*  --  204* 183*  BUN 38*  --  35*  --  36*  --  42* 46*  CREATININE 1.65*  --  1.51*  --  1.76*  --  1.63* 1.71*  CALCIUM 8.9  --  8.8*  --  8.7*  --  8.6* 8.4*   < > = values in this interval not displayed.   GFR Estimated Creatinine Clearance: 49.5 mL/min (A) (by C-G formula based on SCr of 1.71 mg/dL (H)). Liver Function Tests: Recent Labs  Lab 06/24/2020 2033  AST 21  ALT 13  ALKPHOS 87  BILITOT 1.0  PROT 6.3*  ALBUMIN 3.6   No results for input(s): LIPASE, AMYLASE in the last 168 hours. No results for  input(s): AMMONIA in the last 168 hours. Coagulation profile Recent Labs  Lab 06/17/2020 2033  INR 1.4*   COVID-19 Labs  No results for input(s): DDIMER, FERRITIN, LDH, CRP in the last 72 hours.  Lab Results  Component Value Date   SARSCOV2NAA NEGATIVE 06/12/2020   [place] NEGATIVE 05/05/2020   [place] NEGATIVE 11/19/2019    CBC: Recent Labs  Lab  06/30/2020 1730 06/24/20 1350  WBC 8.6 3.2*  NEUTROABS  --  2.7  HGB 13.0 12.2*  HCT 40.7 35.9*  MCV 101.5* 96.0  PLT 120* 79*   Cardiac Enzymes: No results for input(s): CKTOTAL, CKMB, CKMBINDEX, TROPONINI in the last 168 hours. BNP (last 3 results) No results for input(s): PROBNP in the last 8760 hours. CBG: Recent Labs  Lab 06/25/20 1144 06/25/20 1639 06/25/20 2104 06/26/20 0557 06/26/20 0734  GLUCAP 207* 184* 198* 176* 203*   D-Dimer: No results for input(s): DDIMER in the last 72 hours. Hgb A1c: No results for input(s): HGBA1C in the last 72 hours. Lipid Profile: No results for input(s): CHOL, HDL, LDLCALC, TRIG, CHOLHDL, LDLDIRECT in the last 72 hours. Thyroid function studies: No results for input(s): TSH, T4TOTAL, T3FREE, THYROIDAB in the last 72 hours.  Invalid input(s): FREET3 Anemia work up: No results for input(s): VITAMINB12, FOLATE, FERRITIN, TIBC, IRON, RETICCTPCT in the last 72 hours. Sepsis Labs: Recent Labs  Lab 06/13/2020 1730 06/17/2020 2033 06/24/20 1350  WBC 8.6  --  3.2*  LATICACIDVEN  --  1.0  --    Microbiology Recent Results (from the past 240 hour(s))  Respiratory Panel by RT PCR (Flu A&B, Covid) - Nasopharyngeal Swab     Status: None   Collection Time: 06/16/2020  8:33 PM   Specimen: Nasopharyngeal Swab  Result Value Ref Range Status   SARS Coronavirus 2 by RT PCR NEGATIVE NEGATIVE Final    Comment: (NOTE) SARS-CoV-2 target nucleic acids are NOT DETECTED.  The SARS-CoV-2 RNA is generally detectable in upper respiratoy specimens during the acute phase of infection. The  lowest concentration of SARS-CoV-2 viral copies this assay can detect is 131 copies/mL. A negative result does not preclude SARS-Cov-2 infection and should not be used as the sole basis for treatment or other patient management decisions. A negative result may occur with  improper specimen collection/handling, submission of specimen other than nasopharyngeal swab, presence of viral mutation(s) within the areas targeted by this assay, and inadequate number of viral copies (<131 copies/mL). A negative result must be combined with clinical observations, patient history, and epidemiological information. The expected result is Negative.  Fact Sheet for Patients:  PinkCheek.be  Fact Sheet for Healthcare Providers:  GravelBags.it  This test is no t yet approved or cleared by the [place] FDA and  has been authorized for detection and/or diagnosis of SARS-CoV-2 by FDA under an Emergency Use Authorization (EUA). This EUA will remain  in effect (meaning this test can be used) for the duration of the COVID-19 declaration under Section 564(b)(1) of the Act, 21 U.S.C. section 360bbb-3(b)(1), unless the authorization is terminated or revoked sooner.     Influenza A by PCR NEGATIVE NEGATIVE Final   Influenza B by PCR NEGATIVE NEGATIVE Final    Comment: (NOTE) The Xpert Xpress SARS-CoV-2/FLU/RSV assay is intended as an aid in  the diagnosis of influenza from Nasopharyngeal swab specimens and  should not be used as a sole basis for treatment. Nasal washings and  aspirates are unacceptable for Xpert Xpress SARS-CoV-2/FLU/RSV  testing.  Fact Sheet for Patients: PinkCheek.be  Fact Sheet for Healthcare Providers: GravelBags.it  This test is not yet approved or cleared by the [place] FDA and  has been authorized for detection and/or diagnosis of SARS-CoV-2 by  FDA under  an Emergency Use Authorization (EUA). This EUA will remain  in effect (meaning this test can be used) for the duration of the  Covid-19 declaration under Section  564(b)(1) of the Act, 21  U.S.C. section 360bbb-3(b)(1), unless the authorization is  terminated or revoked. Performed at [facility], [place] [address] [place], [place] [address]      Medications:   . apixaban  5 mg Oral BID  . carvedilol  3.125 mg Oral BID WC  . cholestyramine  4 g Oral Q1400  . clopidogrel  75 mg Oral Daily  . dapagliflozin propanediol  10 mg Oral Daily  . FLUoxetine  40 mg Oral QHS  . Gerhardt's butt cream   Topical TID  . hydrALAZINE  25 mg Oral Q6H  . insulin aspart  0-15 Units Subcutaneous TID WC  . insulin aspart  0-5 Units Subcutaneous QHS  . insulin aspart  6 Units Subcutaneous TID WC  . insulin regular human CONCENTRATED  50 Units Subcutaneous BID WC  . isosorbide mononitrate  60 mg Oral Daily  . potassium chloride  40 mEq Oral BID  . rosuvastatin  20 mg Oral QHS  . sodium chloride flush  3 mL Intravenous Q12H  . torsemide  40 mg Oral Daily   Continuous Infusions: . sodium chloride        LOS: 7 days   Charlynne Cousins  [facility]  06/26/2020, 9:55 AM

## 2020-06-26 NOTE — Progress Notes (Signed)
Patient repeatedly calling for assistance to be more comfortable in the bed and expressing he cannot breathe. Patient assisted to the chair and after sitting for 30 minutes patient insistent on returning to bed. Patient then calling out to sit on the edge of bed as he cannot tolerate laying down. After helping to EOB patient reminded he is not exhibiting safety awareness and cannot be left along while on the EOB. Patient helped back to bed and repositioned. After 15 minutes patient is again calling out expressing he is unable to breathe. Nurse tech at the bedside checked HR=71, RR=20 and Oxygen saturation on RA=98%. Patient given further education on the need to avoid using supplemental oxygen just for comfort measures when it is not clinically indicated. Patient given more education on safety compliance, oxygen use and asked to continue to call for assistance as needed. Bed alarm on and call bell within reach.

## 2020-06-27 DIAGNOSIS — I10 Essential (primary) hypertension: Secondary | ICD-10-CM | POA: Diagnosis not present

## 2020-06-27 DIAGNOSIS — N1832 Chronic kidney disease, stage 3b: Secondary | ICD-10-CM | POA: Diagnosis not present

## 2020-06-27 DIAGNOSIS — N183 Chronic kidney disease, stage 3 unspecified: Secondary | ICD-10-CM | POA: Diagnosis not present

## 2020-06-27 DIAGNOSIS — J9601 Acute respiratory failure with hypoxia: Secondary | ICD-10-CM | POA: Diagnosis not present

## 2020-06-27 DIAGNOSIS — I5023 Acute on chronic systolic (congestive) heart failure: Secondary | ICD-10-CM | POA: Diagnosis not present

## 2020-06-27 DIAGNOSIS — Z95810 Presence of automatic (implantable) cardiac defibrillator: Secondary | ICD-10-CM | POA: Diagnosis not present

## 2020-06-27 LAB — CBC WITH DIFFERENTIAL/PLATELET
Abs Immature Granulocytes: 0.03 10*3/uL (ref 0.00–0.07)
Basophils Absolute: 0 10*3/uL (ref 0.0–0.1)
Basophils Relative: 0 %
Eosinophils Absolute: 0.1 10*3/uL (ref 0.0–0.5)
Eosinophils Relative: 1 %
HCT: 35.8 % — ABNORMAL LOW (ref 39.0–52.0)
Hemoglobin: 12.3 g/dL — ABNORMAL LOW (ref 13.0–17.0)
Immature Granulocytes: 1 %
Lymphocytes Relative: 15 %
Lymphs Abs: 0.9 10*3/uL (ref 0.7–4.0)
MCH: 31.9 pg (ref 26.0–34.0)
MCHC: 34.4 g/dL (ref 30.0–36.0)
MCV: 93 fL (ref 80.0–100.0)
Monocytes Absolute: 0.7 10*3/uL (ref 0.1–1.0)
Monocytes Relative: 12 %
Neutro Abs: 4.3 10*3/uL (ref 1.7–7.7)
Neutrophils Relative %: 71 %
Platelets: 82 10*3/uL — ABNORMAL LOW (ref 150–400)
RBC: 3.85 MIL/uL — ABNORMAL LOW (ref 4.22–5.81)
RDW: 14.5 % (ref 11.5–15.5)
WBC: 6 10*3/uL (ref 4.0–10.5)
nRBC: 0 % (ref 0.0–0.2)

## 2020-06-27 LAB — BASIC METABOLIC PANEL
Anion gap: 13 (ref 5–15)
BUN: 53 mg/dL — ABNORMAL HIGH (ref 8–23)
CO2: 24 mmol/L (ref 22–32)
Calcium: 8.6 mg/dL — ABNORMAL LOW (ref 8.9–10.3)
Chloride: 100 mmol/L (ref 98–111)
Creatinine, Ser: 1.6 mg/dL — ABNORMAL HIGH (ref 0.61–1.24)
GFR, Estimated: 44 mL/min — ABNORMAL LOW (ref >60)
Glucose, Bld: 98 mg/dL (ref 70–99)
Potassium: 3.6 mmol/L (ref 3.5–5.1)
Sodium: 137 mmol/L (ref 135–145)

## 2020-06-27 LAB — GLUCOSE, CAPILLARY
Glucose-Capillary: 104 mg/dL — ABNORMAL HIGH (ref 70–99)
Glucose-Capillary: 217 mg/dL — ABNORMAL HIGH (ref 70–99)
Glucose-Capillary: 189 mg/dL — ABNORMAL HIGH (ref 70–99)
Glucose-Capillary: 76 mg/dL (ref 70–99)

## 2020-06-27 MED ORDER — FUROSEMIDE 10 MG/ML IJ SOLN
80.0000 mg | Freq: Once | INTRAMUSCULAR | Status: AC
  Administered 2020-06-27: 80 mg via INTRAVENOUS
  Filled 2020-06-27: qty 8

## 2020-06-27 NOTE — Progress Notes (Signed)
Progress Note  Patient Name: Vernon Briggs Date of Encounter: 06/27/2020  [facility] Cardiologist: Fransico Him, MD   Subjective   Reports that he is chronically tired and too weak to sit in the chair.  Inpatient Medications    Scheduled Meds: . apixaban  5 mg Oral BID  . carvedilol  3.125 mg Oral BID WC  . cholestyramine  4 g Oral Q1400  . clopidogrel  75 mg Oral Daily  . dapagliflozin propanediol  10 mg Oral Daily  . FLUoxetine  40 mg Oral QHS  . Gerhardt's butt cream   Topical TID  . hydrALAZINE  25 mg Oral Q6H  . insulin aspart  0-15 Units Subcutaneous TID WC  . insulin aspart  0-5 Units Subcutaneous QHS  . insulin aspart  10 Units Subcutaneous TID WC  . insulin regular human CONCENTRATED  50 Units Subcutaneous BID WC  . isosorbide mononitrate  60 mg Oral Daily  . rosuvastatin  20 mg Oral QHS  . sodium chloride flush  3 mL Intravenous Q12H  . torsemide  40 mg Oral Daily   Continuous Infusions: . sodium chloride     PRN Meds: sodium chloride, acetaminophen, ipratropium-albuterol, loperamide, ondansetron (ZOFRAN) IV, sodium chloride flush   Vital Signs    Vitals:   06/26/20 1730 06/26/20 1929 06/27/20 0030 06/27/20 0614  BP: (!) 151/71 130/69 140/71 136/62  Pulse: 79 73  66  Resp:  20  19  Temp:  98.4 F (36.9 C)  98 F (36.7 C)  TempSrc:  Oral  Oral  SpO2:  98%  100%  Weight:    121.1 kg    Intake/Output Summary (Last 24 hours) at 06/27/2020 0844 Last data filed at 06/27/2020 0617 Gross per 24 hour  Intake 1173 ml  Output 1600 ml  Net -427 ml   Last 3 Weights 06/27/2020 06/26/2020 06/24/2020  Weight (lbs) 266 lb 15.6 oz 268 lb 4.8 oz 270 lb 15.1 oz  Weight (kg) 121.1 kg 121.7 kg 122.9 kg      Telemetry    VP - Personally Reviewed  ECG    n/a- Personally Reviewed  Physical Exam   VS:  BP 136/62 (BP Location: Left Arm)   Pulse 66   Temp 98 F (36.7 C) (Oral)   Resp 19   Wt 121.1 kg   SpO2 100%   BMI 36.21 kg/m  , BMI Body  mass index is 36.21 kg/m. GENERAL:  Chronically ill-appearing HEENT: Pupils equal round and reactive, fundi not visualized, oral mucosa unremarkable NECK:  No jugular venous distention, waveform within normal limits, carotid upstroke brisk and symmetric, no bruits LUNGS:  Poor air movement.  No crackles.  HEART:  RRR.  PMI not displaced or sustained,S1 and S2 within normal limits, no S3, no S4, no clicks, no rubs, no murmurs ABD:  Flat, positive bowel sounds normal in frequency in pitch, no bruits, no rebound, no guarding, no midline pulsatile mass, no hepatomegaly, no splenomegaly EXT:  2 plus pulses throughout, 1+ LE edema, no cyanosis no clubbing SKIN:  No rashes no nodules NEURO:  Cranial nerves II through XII grossly intact, motor grossly intact throughout PSYCH:  Cognitively intact, oriented to person place and time   Labs    High Sensitivity Troponin:   Recent Labs  Lab 06/06/2020 1730 06/15/2020 2033  TROPONINIHS 73* 66*      Chemistry Recent Labs  Lab 06/25/20 0957 06/26/20 0444 06/27/20 0452  NA 136 134* 137  K 3.4* 3.3*  3.6  CL 98 99 100  CO2 25 24 24   GLUCOSE 204* 183* 98  BUN 42* 46* 53*  CREATININE 1.63* 1.71* 1.60*  CALCIUM 8.6* 8.4* 8.6*  GFRNONAA 43* 41* 44*  ANIONGAP 13 11 13      Hematology Recent Labs  Lab 06/24/20 1350 06/26/20 1047 06/27/20 0452  WBC 3.2* 5.0 6.0  RBC 3.74* 3.71* 3.85*  HGB 12.2* 11.9* 12.3*  HCT 35.9* 34.8* 35.8*  MCV 96.0 93.8 93.0  MCH 32.6 32.1 31.9  MCHC 34.0 34.2 34.4  RDW 14.5 14.6 14.5  PLT 79* 80* 82*    BNP No results for input(s): BNP, PROBNP in the last 168 hours.   DDimer No results for input(s): DDIMER in the last 168 hours.   Radiology    No results found.  Cardiac Studies   Echo 06/20/20:  1. Left ventricular ejection fraction, by estimation, is 25 to 30%. The  left ventricle has severely decreased function. The left ventricle  demonstrates regional wall motion abnormalities (see scoring   diagram/findings for description). The left  ventricular internal cavity size was mildly dilated. Left ventricular  diastolic parameters are indeterminate.  2. Right ventricular systolic function is mildly reduced. The right  ventricular size is mildly enlarged. There is moderately elevated  pulmonary artery systolic pressure. The estimated right ventricular  systolic pressure is 94.4 mmHg.  3. Left atrial size was mildly dilated.  4. Right atrial size was moderately dilated.  5. The mitral valve is grossly normal. Moderate mitral valve  regurgitation. No evidence of mitral stenosis.  6. Tricuspid valve regurgitation is moderate to severe.  7. The aortic valve is calcified. There is mild calcification of the  aortic valve. Aortic valve regurgitation is mild. No aortic stenosis is  present.  8. Aortic dilatation noted. There is mild dilatation of the ascending  aorta, measuring 42 mm.  9. The inferior vena cava is dilated in size with <50% respiratory  variability, suggesting right atrial pressure of 15 mmHg.   Patient Profile     74 y.o. male with CAD s/p CABG LIMA to LAD, SVG to dRCA, SVG to OM1, and SVG to RI) in 1999, multiple PCI's, chronic systolic and diastolic heart failure, LVEF 25 to 30%, status post [place] ICD, paroxysmal atrial fibrillation, prior stroke, hypertension, hyperlipidemia, diabetes, CKD 3, OSA on CPAP, anxiety/depression and PTSD admitted with acute on chronic systolic congestive heart failure.  Assessment & Plan    # Acute on chronic systolic congestive heart failure: # Hypertension: BP stable on carvedilol, Imdur, and hydralazine.  Diuresing with lasix 80mg  IV daily.  He was switched to oral torsemide at higher dose and was net -400 mL yesterday.  He has a little more LE edema.  Will order TED hose and give a dose of lasix 80mg  IV x1 this afternoon.  He doesn't have BP or renal function to allow titration of his GDMT.  If BP and renal function allow,  he would benefit from [place].  Consider vericiguat, though he cannot be on this with Imdur.  # Demand ischemia: High-sensitivity troponin was minimally elevated and flat at 73--> 66. Continue carvedilol and Plavix.  #Paroxysmal atrial fibrillation: Continue Eliquis and carvedilol.  Currently VP.  # Hypertension: BP stable on carvedilol, hydralazine, Imdur and torsemide.  Home dose of torsemide was increased from 20mg  to 40mg .  Will continue to monitor I/O.  # CKD 3: Started on Farxiga this admission.  Agree with this addition. Creatinine stable with diuresis.  #  OSA: Continue CPAP.  # Diarrhea: Per primary.  # Fever: Thought ot be due to thrombophlebitis.  Also has diarrhea.      For questions or updates, please contact [place] Please consult [email or web address] for contact info under        Signed, Skeet Latch, MD  06/27/2020, 8:44 AM

## 2020-06-27 NOTE — Progress Notes (Signed)
[facility] PROGRESS NOTE    Progress Note  Vernon Briggs  HUT:[id number] DOB: 08-Oct-1945 DOA: 06/08/2020 PCP: [name], MD     Brief Narrative:   Vernon Briggs is an 74 y.o. 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  Assessment/Plan:   Acute respiratory failure with hypoxia secondarily to acute on chronic systolic CHF (congestive heart failure): In the context of recently decreased torsemide and discontinuation of Aldactone. We will continue Crestor, Plavix, Imdur, beta-blocker, Farxiga and hydralazine. On his previous admission it was recommended not to start an ACE inhibitor Aldactone. Continue to try to wean patient off oxygen. Further management per cardiology. Transition to his oral torsemide Awaiting skilled nursing facility placement.  Elevated troponin's: Continue Plavix, Eliquis, Coreg, indoor and Crestor. Likely demand ischemia in the setting of acute systolic heart failure.  Diabetes mellitus type 2 with complications: Continue long-acting insulin U5 100+ sliding scale.  Episode of hypoglycemia this morning decreased U5 100.  Chronic kidney disease stage IIIb: Ending appears to be at baseline continue to monitor closely with diuresis. Creatinine today 1.7.  See above for further details.  Asthma atrial fibrillation: Paced rhythm, on Coreg and apixaban.  Obstructive sleep apnea: Continue CPAP.  DVT prophylaxis: Apixiban Family Communication:none Status is: Inpatient  Remains inpatient appropriate because:Hemodynamically unstable   Dispo: The patient is from: Home              Anticipated d/c is to: Skilled nursing facility              Anticipated d/c date is: 1 days              Patient currently is not medically stable to d/c.   Code Status:      Code Status Orders  (From admission, onward)         Start     Ordered   06/28/2020 2152  Do not attempt resuscitation (DNR)  Continuous       Question Answer Comment  In the event of cardiac or respiratory ARREST Do not call a "code blue"   In the event of cardiac or respiratory ARREST Do not perform Intubation, CPR, defibrillation or ACLS   In the event of cardiac or respiratory ARREST Use medication by any route, position, wound care, and other measures to relive pain and suffering. May use oxygen, suction and manual treatment of airway obstruction as needed for comfort.      06/26/2020 2152        Code Status History    Date Active Date Inactive Code Status Order ID Comments User Context   05/05/2020 2342 05/10/2020 2144 DNR [id number]  Etta Quill, DO ED   11/25/2019 1237 11/26/2019 2031 DNR [id number]  Oswald Hillock, MD Inpatient   11/24/2019 1339 11/25/2019 1237 Full Code [id number]  [name], MD Inpatient   11/20/2019 0150 11/24/2019 1338 DNR [id number]  Rise Patience, MD Inpatient   04/02/2015 1725 04/05/2015 1557 DNR [id number]  Melton Alar, PA-C Inpatient   09/14/2014 0952 09/15/2014 1430 Full Code [id number]  Leonie Man, MD Inpatient   09/09/2014 0331 09/14/2014 0952 Full Code [id number]  Lamar Sprinkles, MD Inpatient   10/20/2012 1922 10/22/2012 1421 DNR [id number]  Marzetta Board, MD ED   Advance Care Planning Activity        IV Access:  Peripheral IV   Procedures and diagnostic studies:   No results found.   Medical Consultants:    None.  Anti-Infectives:   none  Subjective:    Vernon Briggs no new complaints this morning.  Objective:    Vitals:   06/26/20 1929 06/27/20 0030 06/27/20 0614 06/27/20 0918  BP: 130/69 140/71 136/62 128/65  Pulse: 73  66 70  Resp: 20  19 18   Temp: 98.4 F (36.9 C)  98 F (36.7 C) 99.2 F (37.3 C)  TempSrc: Oral  Oral Oral  SpO2: 98%  100% 99%  Weight:   121.1 kg    SpO2: 99 % O2 Flow Rate  (L/min): 2 L/min   Intake/Output Summary (Last 24 hours) at 06/27/2020 0941 Last data filed at 06/27/2020 0900 Gross per 24 hour  Intake 933 ml  Output 1550 ml  Net -617 ml   Filed Weights   06/24/20 0354 06/26/20 0254 06/27/20 0614  Weight: 122.9 kg 121.7 kg 121.1 kg    Exam: General exam: In no acute distress. Respiratory system: Good air movement and clear to auscultation. Cardiovascular system: S1 & S2 heard, RRR. No JVD. Gastrointestinal system: Abdomen is nondistended, soft and nontender.  Extremities: No pedal edema. Skin: No rashes, lesions or ulcers  Data Reviewed:    Labs: Basic Metabolic Panel: Recent Labs  Lab 06/23/20 0450 06/23/20 0450 06/24/20 0314 06/24/20 0314 06/25/20 0957 06/25/20 0957 06/26/20 0444 06/27/20 0452  NA 140  --  136  --  136  --  134* 137  K 3.3*   < > 3.7   < > 3.4*   < > 3.3* 3.6  CL 103  --  98  --  98  --  99 100  CO2 25  --  26  --  25  --  24 24  GLUCOSE 57*  --  171*  --  204*  --  183* 98  BUN 35*  --  36*  --  42*  --  46* 53*  CREATININE 1.51*  --  1.76*  --  1.63*  --  1.71* 1.60*  CALCIUM 8.8*  --  8.7*  --  8.6*  --  8.4* 8.6*   < > = values in this interval not displayed.   GFR Estimated Creatinine Clearance: 52.8 mL/min (A) (by C-G formula based on SCr of 1.6 mg/dL (H)). Liver Function Tests: No results for input(s): AST, ALT, ALKPHOS, BILITOT, PROT, ALBUMIN in the last 168 hours. No results for input(s): LIPASE, AMYLASE in the last 168 hours. No results for input(s): AMMONIA in the last 168 hours. Coagulation profile No results for input(s): INR, PROTIME in the last 168 hours. COVID-19 Labs  No results for input(s): DDIMER, FERRITIN, LDH, CRP in the last 72 hours.  Lab Results  Component Value Date   SARSCOV2NAA NEGATIVE 07/02/2020   [place] NEGATIVE 05/05/2020   [place] NEGATIVE 11/19/2019    CBC: Recent Labs  Lab 06/24/20 1350 06/26/20 1047 06/27/20 0452  WBC 3.2* 5.0 6.0  NEUTROABS  2.7 3.3 4.3  HGB 12.2* 11.9* 12.3*  HCT 35.9* 34.8* 35.8*  MCV 96.0 93.8 93.0  PLT 79* 80* 82*   Cardiac Enzymes: No results for input(s): CKTOTAL, CKMB, CKMBINDEX, TROPONINI in the last 168 hours. BNP (last 3 results) No results for input(s): PROBNP in the last 8760 hours. CBG: Recent Labs  Lab 06/26/20 0734 06/26/20 1232 06/26/20 1705 06/26/20 2035 06/27/20 0615  GLUCAP 203* 188* 172* 99 104*   D-Dimer: No  results for input(s): DDIMER in the last 72 hours. Hgb A1c: No results for input(s): HGBA1C in the last 72 hours. Lipid Profile: No results for input(s): CHOL, HDL, LDLCALC, TRIG, CHOLHDL, LDLDIRECT in the last 72 hours. Thyroid function studies: No results for input(s): TSH, T4TOTAL, T3FREE, THYROIDAB in the last 72 hours.  Invalid input(s): FREET3 Anemia work up: No results for input(s): VITAMINB12, FOLATE, FERRITIN, TIBC, IRON, RETICCTPCT in the last 72 hours. Sepsis Labs: Recent Labs  Lab 06/24/20 1350 06/26/20 1047 06/27/20 0452  WBC 3.2* 5.0 6.0   Microbiology Recent Results (from the past 240 hour(s))  Respiratory Panel by RT PCR (Flu A&B, Covid) - Nasopharyngeal Swab     Status: None   Collection Time: 07/02/2020  8:33 PM   Specimen: Nasopharyngeal Swab  Result Value Ref Range Status   SARS Coronavirus 2 by RT PCR NEGATIVE NEGATIVE Final    Comment: (NOTE) SARS-CoV-2 target nucleic acids are NOT DETECTED.  The SARS-CoV-2 RNA is generally detectable in upper respiratoy specimens during the acute phase of infection. The lowest concentration of SARS-CoV-2 viral copies this assay can detect is 131 copies/mL. A negative result does not preclude SARS-Cov-2 infection and should not be used as the sole basis for treatment or other patient management decisions. A negative result may occur with  improper specimen collection/handling, submission of specimen other than nasopharyngeal swab, presence of viral mutation(s) within the areas targeted by this assay,  and inadequate number of viral copies (<131 copies/mL). A negative result must be combined with clinical observations, patient history, and epidemiological information. The expected result is Negative.  Fact Sheet for Patients:  PinkCheek.be  Fact Sheet for Healthcare Providers:  GravelBags.it  This test is no t yet approved or cleared by the [place] FDA and  has been authorized for detection and/or diagnosis of SARS-CoV-2 by FDA under an Emergency Use Authorization (EUA). This EUA will remain  in effect (meaning this test can be used) for the duration of the COVID-19 declaration under Section 564(b)(1) of the Act, 21 U.S.C. section 360bbb-3(b)(1), unless the authorization is terminated or revoked sooner.     Influenza A by PCR NEGATIVE NEGATIVE Final   Influenza B by PCR NEGATIVE NEGATIVE Final    Comment: (NOTE) The Xpert Xpress SARS-CoV-2/FLU/RSV assay is intended as an aid in  the diagnosis of influenza from Nasopharyngeal swab specimens and  should not be used as a sole basis for treatment. Nasal washings and  aspirates are unacceptable for Xpert Xpress SARS-CoV-2/FLU/RSV  testing.  Fact Sheet for Patients: PinkCheek.be  Fact Sheet for Healthcare Providers: GravelBags.it  This test is not yet approved or cleared by the [place] FDA and  has been authorized for detection and/or diagnosis of SARS-CoV-2 by  FDA under an Emergency Use Authorization (EUA). This EUA will remain  in effect (meaning this test can be used) for the duration of the  Covid-19 declaration under Section 564(b)(1) of the Act, 21  U.S.C. section 360bbb-3(b)(1), unless the authorization is  terminated or revoked. Performed at [facility], [place] [address], [place], [place] [address]      Medications:   . apixaban  5 mg Oral BID  . carvedilol  3.125 mg Oral BID WC  .  cholestyramine  4 g Oral Q1400  . clopidogrel  75 mg Oral Daily  . dapagliflozin propanediol  10 mg Oral Daily  . FLUoxetine  40 mg Oral QHS  . furosemide  80 mg Intravenous Once  . Gerhardt's  butt cream   Topical TID  . hydrALAZINE  25 mg Oral Q6H  . insulin aspart  0-15 Units Subcutaneous TID WC  . insulin aspart  0-5 Units Subcutaneous QHS  . insulin aspart  10 Units Subcutaneous TID WC  . insulin regular human CONCENTRATED  50 Units Subcutaneous BID WC  . isosorbide mononitrate  60 mg Oral Daily  . rosuvastatin  20 mg Oral QHS  . sodium chloride flush  3 mL Intravenous Q12H  . torsemide  40 mg Oral Daily   Continuous Infusions: . sodium chloride        LOS: 8 days   Charlynne Cousins  [facility]  06/27/2020, 9:41 AM

## 2020-06-27 NOTE — Progress Notes (Signed)
Pt uses home cpap machine. Pt wants to put cpap on when he is ready for bed.

## 2020-06-28 DIAGNOSIS — I10 Essential (primary) hypertension: Secondary | ICD-10-CM | POA: Diagnosis not present

## 2020-06-28 DIAGNOSIS — J9601 Acute respiratory failure with hypoxia: Secondary | ICD-10-CM | POA: Diagnosis not present

## 2020-06-28 DIAGNOSIS — I5023 Acute on chronic systolic (congestive) heart failure: Secondary | ICD-10-CM | POA: Diagnosis not present

## 2020-06-28 DIAGNOSIS — N183 Chronic kidney disease, stage 3 unspecified: Secondary | ICD-10-CM | POA: Diagnosis not present

## 2020-06-28 LAB — CBC WITH DIFFERENTIAL/PLATELET
Abs Immature Granulocytes: 0.08 10*3/uL — ABNORMAL HIGH (ref 0.00–0.07)
Basophils Absolute: 0 10*3/uL (ref 0.0–0.1)
Basophils Relative: 0 %
Eosinophils Absolute: 0 10*3/uL (ref 0.0–0.5)
Eosinophils Relative: 0 %
HCT: 34.7 % — ABNORMAL LOW (ref 39.0–52.0)
Hemoglobin: 11.8 g/dL — ABNORMAL LOW (ref 13.0–17.0)
Immature Granulocytes: 1 %
Lymphocytes Relative: 3 %
Lymphs Abs: 0.4 10*3/uL — ABNORMAL LOW (ref 0.7–4.0)
MCH: 31.9 pg (ref 26.0–34.0)
MCHC: 34 g/dL (ref 30.0–36.0)
MCV: 93.8 fL (ref 80.0–100.0)
Monocytes Absolute: 0.4 10*3/uL (ref 0.1–1.0)
Monocytes Relative: 4 %
Neutro Abs: 9.9 10*3/uL — ABNORMAL HIGH (ref 1.7–7.7)
Neutrophils Relative %: 92 %
Platelets: 101 10*3/uL — ABNORMAL LOW (ref 150–400)
RBC: 3.7 MIL/uL — ABNORMAL LOW (ref 4.22–5.81)
RDW: 14.6 % (ref 11.5–15.5)
WBC: 10.8 10*3/uL — ABNORMAL HIGH (ref 4.0–10.5)
nRBC: 0 % (ref 0.0–0.2)

## 2020-06-28 LAB — BASIC METABOLIC PANEL
Anion gap: 12 (ref 5–15)
BUN: 52 mg/dL — ABNORMAL HIGH (ref 8–23)
CO2: 24 mmol/L (ref 22–32)
Calcium: 8.2 mg/dL — ABNORMAL LOW (ref 8.9–10.3)
Chloride: 100 mmol/L (ref 98–111)
Creatinine, Ser: 1.92 mg/dL — ABNORMAL HIGH (ref 0.61–1.24)
GFR, Estimated: 36 mL/min — ABNORMAL LOW (ref >60)
Glucose, Bld: 185 mg/dL — ABNORMAL HIGH (ref 70–99)
Potassium: 3.5 mmol/L (ref 3.5–5.1)
Sodium: 136 mmol/L (ref 135–145)

## 2020-06-28 LAB — GLUCOSE, CAPILLARY
Glucose-Capillary: 151 mg/dL — ABNORMAL HIGH (ref 70–99)
Glucose-Capillary: 172 mg/dL — ABNORMAL HIGH (ref 70–99)
Glucose-Capillary: 283 mg/dL — ABNORMAL HIGH (ref 70–99)
Glucose-Capillary: 221 mg/dL — ABNORMAL HIGH (ref 70–99)

## 2020-06-28 LAB — MAGNESIUM: Magnesium: 2.1 mg/dL (ref 1.7–2.4)

## 2020-06-28 MED ORDER — TRAMADOL HCL 50 MG PO TABS
50.0000 mg | ORAL_TABLET | Freq: Four times a day (QID) | ORAL | Status: DC | PRN
  Administered 2020-06-28 – 2020-06-30 (×7): 50 mg via ORAL
  Filled 2020-06-28 (×8): qty 1

## 2020-06-28 MED ORDER — DAPAGLIFLOZIN PROPANEDIOL 10 MG PO TABS: 10.0000 mg | ORAL_TABLET | Freq: Every day | ORAL | Status: AC

## 2020-06-28 MED ORDER — TORSEMIDE 20 MG PO TABS: 40.0000 mg | ORAL_TABLET | Freq: Every day | ORAL | Status: AC

## 2020-06-28 MED ORDER — POTASSIUM CHLORIDE CRYS ER 10 MEQ PO TBCR
10.0000 meq | EXTENDED_RELEASE_TABLET | Freq: Once | ORAL | Status: AC
  Administered 2020-06-28: 10 meq via ORAL
  Filled 2020-06-28: qty 1

## 2020-06-28 MED ORDER — MAGNESIUM SULFATE IN D5W 1-5 GM/100ML-% IV SOLN
1.0000 g | Freq: Once | INTRAVENOUS | Status: AC
  Administered 2020-06-28: 1 g via INTRAVENOUS
  Filled 2020-06-28: qty 100

## 2020-06-28 MED ORDER — HYDRALAZINE HCL 25 MG PO TABS: 25.0000 mg | ORAL_TABLET | Freq: Four times a day (QID) | ORAL | Status: AC

## 2020-06-28 NOTE — Discharge Summary (Addendum)
Physician Discharge Summary  Vernon Briggs JGG:[id number] DOB: October 10, 1945 DOA: 06/14/2020  PCP: [name], MD  Admit date: 06/18/2020 Discharge date: 06/28/2020  Admitted From: Home Disposition:  SNF  Recommendations for Outpatient Follow-up:  1. Follow up with PCP in 1-2 weeks 2. Please obtain BMP/CBC in one week   Home Health:No Equipment/Devices:None  Discharge Condition:Stable CODE STATUS:DNR Diet recommendation: Heart Healthy   Brief/Interim Summary: 74 y.o. 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  Discharge Diagnoses:  Principal Problem:   Acute on chronic systolic CHF (congestive heart failure) (HCC) Active Problems:   Automatic implantable cardioverter-defibrillator in situ   Type II diabetes mellitus with complication (HCC)   Hyperlipidemia with target LDL less than 70   Essential hypertension   S/P CABG x 12-1997   Cardiomyopathy, ischemic   PAF (paroxysmal atrial fibrillation) (HCC)   Weakness generalized   CKD (chronic kidney disease) stage 3, GFR 30-59 ml/min (HCC)   OSA (obstructive sleep apnea)   Acute hypoxemic respiratory failure (HCC)  Acute respiratory failure with hypoxia secondary to acute on chronic systolic heart failure: In the setting of noncompliance with his medication. He was continued on Crestor, Plavix, indoor, beta-blocker, Farxiga and hydralazine. On previous admission he was recommended not to start an ACE inhibitor or Aldactone. Patient was weaned off oxygen once he was diuresed he was transitioned to oral torsemide.  Physical therapy evaluated the patient recommended skilled nursing facility.  Elevated troponins: No change made to his medication likely demand ischemia in the setting of acute systolic heart failure.  Diabetes mellitus  type 2 with complications no changes to his insulin regimen.  Chronic kidney disease stage IIIb: His creatinine appears to be at baseline.  Chronic atrial fibrillation: Paced rhythm continue Coreg and apixaban.  Obstructive sleep apnea continue CPAP  Discharge Instructions  Discharge Instructions    Diet - low sodium heart healthy   Complete by: As directed    Increase activity slowly   Complete by: As directed    No wound care   Complete by: As directed      Allergies as of 06/28/2020      Reactions   Tape Other (See Comments)   Causes skin irritation- use sensitive tape ONLY!!      Medication List    STOP taking these medications   hydroxypropyl methylcellulose / hypromellose 2.5 % ophthalmic solution Commonly known as: ISOPTO TEARS / GONIOVISC   ketoconazole 2 % cream Commonly known as: NIZORAL     TAKE these medications   acetaminophen 325 MG tablet Commonly known as: TYLENOL Take 2 tablets (650 mg total) by mouth every 4 (four) hours as needed for headache or mild pain. What changed: how much to take   apixaban 5 MG Tabs tablet Commonly known as: Eliquis Take 1 tablet (5 mg total) by mouth 2 (two) times daily. Start taking on 05/12/20 What changed: additional instructions   carvedilol 3.125 MG tablet Commonly known as: COREG Take 1 tablet (3.125 mg total) by mouth 2 (two) times daily with a meal.   cholestyramine 4 g packet Commonly known as: QUESTRAN Take 1 packet (4 g total) by mouth daily at 2 PM.   clopidogrel 75 MG tablet Commonly known as: PLAVIX Take 1 tablet (75 mg total) by mouth daily. Start taking on 05/14/20 What changed: additional instructions   dapagliflozin  propanediol 10 MG Tabs tablet Commonly known as: FARXIGA Take 1 tablet (10 mg total) by mouth daily.   FLUoxetine 20 MG tablet Commonly known as: PROZAC Take 40 mg by mouth at bedtime.   HumuLIN R U-500 KwikPen 500 UNIT/ML kwikpen Generic drug: insulin regular human  CONCENTRATED Inject 55-110 Units into the skin See admin instructions. Inject 110 units into the skin in the morning before breakfast and 55 units at bedtime   hydrALAZINE 25 MG tablet Commonly known as: APRESOLINE Take 1 tablet (25 mg total) by mouth every 6 (six) hours.   isosorbide mononitrate 30 MG 24 hr tablet Commonly known as: IMDUR Take 1 tablet (30 mg total) by mouth daily.   nitroGLYCERIN 0.4 MG SL tablet Commonly known as: NITROSTAT Place 1 tablet (0.4 mg total) under the tongue every 5 (five) minutes x 3 doses as needed for chest pain.   PRESCRIPTION MEDICATION See admin instructions. CPAP- Use as directed during ANY time of rest   rosuvastatin 20 MG tablet Commonly known as: CRESTOR Take 1 tablet (20 mg total) by mouth at bedtime.   torsemide 20 MG tablet Commonly known as: DEMADEX Take 2 tablets (40 mg total) by mouth daily. What changed: how much to take   Vitamin D-3 25 MCG (1000 UT) Caps Take 1,000 Units by mouth daily.       Follow-up Information    [facility] Follow up.   Specialty: Home Health Services Why: [facility], HHPT, HHOT Contact information: [address] [place] [address] [phone]        Isaiah Serge, NP Follow up on 07/16/2020.   Specialties: Cardiology, Radiology Why: at 10:45am for your follow up appt.  Contact information: [employer] [place] [employer] [address] [place] [address] [phone]              Allergies  Allergen Reactions   Tape Other (See Comments)    Causes skin irritation- use sensitive tape ONLY!!    Consultations:  Cardiology   Procedures/Studies: DG Chest 2 View  Result Date: 07/02/2020 CLINICAL DATA:  Shortness of breath EXAM: CHEST - 2 VIEW COMPARISON:  05/05/2020 FINDINGS: Cardiomegaly. Left pacing device remains in place unchanged. Prior CABG. Mild vascular congestion. Right base atelectasis. Small left pleural effusion. IMPRESSION: Cardiomegaly, vascular congestion. Small left  effusion. Right base atelectasis. Electronically Signed   By: Rolm Baptise M.D.   On: 06/30/2020 17:45   ECHOCARDIOGRAM COMPLETE  Result Date: 06/20/2020    ECHOCARDIOGRAM REPORT   Patient Name:   Vernon Briggs Date of Exam: 06/20/2020 Medical Rec #:  [id number]       Height:       72.0 in Accession #:    [id number]      Weight:       281.7 lb Date of Birth:  1945/09/07       BSA:          2.465 m Patient Age:    9 years        BP:           159/79 mmHg Patient Gender: M               HR:           73 bpm. Exam Location:  Inpatient Procedure: 2D Echo, Cardiac Doppler, Color Doppler and Intracardiac            Opacification Agent Indications:    I50.23 Acute on chronic systolic (congestive) heart failure  History:        Patient has prior history of Echocardiogram examinations.                 Previous Myocardial Infarction and CAD, Defibrillator, Stroke,                 Arrythmias:Atrial Fibrillation; Risk Factors:Hypertension,                 Diabetes, Dyslipidemia, Sleep Apnea and GERD.  Sonographer:    Jonelle Sidle Dance Referring Phys: 10 EMILY B MULLEN  Sonographer Comments: Technically difficult study due to poor echo windows. IMPRESSIONS  1. Left ventricular ejection fraction, by estimation, is 25 to 30%. The left ventricle has severely decreased function. The left ventricle demonstrates regional wall motion abnormalities (see scoring diagram/findings for description). The left ventricular internal cavity size was mildly dilated. Left ventricular diastolic parameters are indeterminate.  2. Right ventricular systolic function is mildly reduced. The right ventricular size is mildly enlarged. There is moderately elevated pulmonary artery systolic pressure. The estimated right ventricular systolic pressure is 31.4 mmHg.  3. Left atrial size was mildly dilated.  4. Right atrial size was moderately dilated.  5. The mitral valve is grossly normal. Moderate mitral valve regurgitation. No evidence of mitral  stenosis.  6. Tricuspid valve regurgitation is moderate to severe.  7. The aortic valve is calcified. There is mild calcification of the aortic valve. Aortic valve regurgitation is mild. No aortic stenosis is present.  8. Aortic dilatation noted. There is mild dilatation of the ascending aorta, measuring 42 mm.  9. The inferior vena cava is dilated in size with <50% respiratory variability, suggesting right atrial pressure of 15 mmHg. FINDINGS  Left Ventricle: Left ventricular ejection fraction, by estimation, is 25 to 30%. The left ventricle has severely decreased function. The left ventricle demonstrates regional wall motion abnormalities. Definity contrast agent was given IV to delineate the left ventricular endocardial borders. The left ventricular internal cavity size was mildly dilated. There is no left ventricular hypertrophy. Left ventricular diastolic parameters are indeterminate.  LV Wall Scoring: The mid and distal anterior septum, mid inferoseptal segment, and apical anterior segment are akinetic. Right Ventricle: The right ventricular size is mildly enlarged. No increase in right ventricular wall thickness. Right ventricular systolic function is mildly reduced. There is moderately elevated pulmonary artery systolic pressure. The tricuspid regurgitant velocity is 2.87 m/s, and with an assumed right atrial pressure of 15 mmHg, the estimated right ventricular systolic pressure is 97.0 mmHg. Left Atrium: Left atrial size was mildly dilated. Right Atrium: Right atrial size was moderately dilated. Pericardium: There is no evidence of pericardial effusion. Mitral Valve: The mitral valve is grossly normal. Moderate mitral valve regurgitation. No evidence of mitral valve stenosis. Tricuspid Valve: The tricuspid valve is normal in structure. Tricuspid valve regurgitation is moderate to severe. No evidence of tricuspid stenosis. Aortic Valve: The aortic valve is calcified. There is mild calcification of the aortic  valve. Aortic valve regurgitation is mild. No aortic stenosis is present. Pulmonic Valve: The pulmonic valve was not well visualized. Pulmonic valve regurgitation is mild to moderate. No evidence of pulmonic stenosis. Aorta: Aortic dilatation noted. There is mild dilatation of the ascending aorta, measuring 42 mm. Venous: The inferior vena cava is dilated in size with less than 50% respiratory variability, suggesting right atrial pressure of 15 mmHg. IAS/Shunts: No atrial level shunt detected by color flow Doppler. Additional Comments: A pacer wire is visualized in the right atrium and right ventricle.  LEFT VENTRICLE PLAX 2D LVIDd:         5.88 cm  Diastology LVIDs:         4.58 cm  LV e' medial:    5.44 cm/s LV PW:         1.42 cm  LV E/e' medial:  16.3 LV IVS:        1.03 cm  LV e' lateral:   8.38 cm/s LVOT diam:     2.00 cm  LV E/e' lateral: 10.6 LV SV:         53 LV SV Index:   21 LVOT Area:     3.14 cm  RIGHT VENTRICLE            IVC RV Basal diam:  3.54 cm    IVC diam: 2.51 cm RV Mid diam:    2.75 cm RV S prime:     6.20 cm/s TAPSE (M-mode): 1.2 cm LEFT ATRIUM             Index       RIGHT ATRIUM           Index LA diam:        5.10 cm 2.07 cm/m  RA Area:     27.10 cm LA Vol (A2C):   95.5 ml 38.75 ml/m RA Volume:   94.30 ml  38.26 ml/m LA Vol (A4C):   67.7 ml 27.47 ml/m LA Biplane Vol: 82.3 ml 33.39 ml/m  AORTIC VALVE LVOT Vmax:   94.90 cm/s LVOT Vmean:  66.600 cm/s LVOT VTI:    0.168 m  AORTA Ao Root diam: 3.60 cm Ao Asc diam:  4.20 cm MITRAL VALVE               TRICUSPID VALVE MV Area (PHT): 2.81 cm    TR Peak grad:   32.9 mmHg MV Decel Time: 270 msec    TR Vmax:        287.00 cm/s MV E velocity: 88.80 cm/s                            SHUNTS                            Systemic VTI:  0.17 m                            Systemic Diam: 2.00 cm Cherlynn Kaiser MD Electronically signed by Cherlynn Kaiser MD Signature Date/Time: 06/20/2020/2:26:11 PM    Final    (Echo, Carotid, EGD, Colonoscopy, ERCP)     Subjective: Relates his breathing is close to baseline.  Discharge Exam: Vitals:   06/28/20 0645 06/28/20 1137  BP:  (!) 86/40  Pulse: 76 68  Resp:  16  Temp: 98.8 F (37.1 C) 98.4 F (36.9 C)  SpO2: 97% 98%   Vitals:   06/28/20 0500 06/28/20 0533 06/28/20 0645 06/28/20 1137  BP:  (!) 124/49  (!) 86/40  Pulse:  91 76 68  Resp:  (!) 22  16  Temp:  99.7 F (37.6 C) 98.8 F (37.1 C) 98.4 F (36.9 C)  TempSrc:  Oral  Oral  SpO2:  95% 97% 98%  Weight: 118.3 kg       General: Pt is alert, awake, not in acute distress Cardiovascular: RRR, S1/S2 +, no rubs, no gallops Respiratory: CTA bilaterally, no wheezing,  no rhonchi Abdominal: Soft, NT, ND, bowel sounds + Extremities: no edema, no cyanosis    The results of significant diagnostics from this hospitalization (including imaging, microbiology, ancillary and laboratory) are listed below for reference.     Microbiology: Recent Results (from the past 240 hour(s))  Respiratory Panel by RT PCR (Flu A&B, Covid) - Nasopharyngeal Swab     Status: None   Collection Time: 07/02/2020  8:33 PM   Specimen: Nasopharyngeal Swab  Result Value Ref Range Status   SARS Coronavirus 2 by RT PCR NEGATIVE NEGATIVE Final    Comment: (NOTE) SARS-CoV-2 target nucleic acids are NOT DETECTED.  The SARS-CoV-2 RNA is generally detectable in upper respiratoy specimens during the acute phase of infection. The lowest concentration of SARS-CoV-2 viral copies this assay can detect is 131 copies/mL. A negative result does not preclude SARS-Cov-2 infection and should not be used as the sole basis for treatment or other patient management decisions. A negative result may occur with  improper specimen collection/handling, submission of specimen other than nasopharyngeal swab, presence of viral mutation(s) within the areas targeted by this assay, and inadequate number of viral copies (<131 copies/mL). A negative result must be combined with  clinical observations, patient history, and epidemiological information. The expected result is Negative.  Fact Sheet for Patients:  PinkCheek.be  Fact Sheet for Healthcare Providers:  GravelBags.it  This test is no t yet approved or cleared by the [place] FDA and  has been authorized for detection and/or diagnosis of SARS-CoV-2 by FDA under an Emergency Use Authorization (EUA). This EUA will remain  in effect (meaning this test can be used) for the duration of the COVID-19 declaration under Section 564(b)(1) of the Act, 21 U.S.C. section 360bbb-3(b)(1), unless the authorization is terminated or revoked sooner.     Influenza A by PCR NEGATIVE NEGATIVE Final   Influenza B by PCR NEGATIVE NEGATIVE Final    Comment: (NOTE) The Xpert Xpress SARS-CoV-2/FLU/RSV assay is intended as an aid in  the diagnosis of influenza from Nasopharyngeal swab specimens and  should not be used as a sole basis for treatment. Nasal washings and  aspirates are unacceptable for Xpert Xpress SARS-CoV-2/FLU/RSV  testing.  Fact Sheet for Patients: PinkCheek.be  Fact Sheet for Healthcare Providers: GravelBags.it  This test is not yet approved or cleared by the [place] FDA and  has been authorized for detection and/or diagnosis of SARS-CoV-2 by  FDA under an Emergency Use Authorization (EUA). This EUA will remain  in effect (meaning this test can be used) for the duration of the  Covid-19 declaration under Section 564(b)(1) of the Act, 21  U.S.C. section 360bbb-3(b)(1), unless the authorization is  terminated or revoked. Performed at [facility], [place] [address] [place], [place] [address]      Labs: BNP (last 3 results) Recent Labs    06/04/2020 2033  BNP 417.4*   Basic Metabolic Panel: Recent Labs  Lab 06/23/20 0450 06/24/20 0314 06/25/20 0957 06/26/20 0444  06/27/20 0452  NA 140 136 136 134* 137  K 3.3* 3.7 3.4* 3.3* 3.6  CL 103 98 98 99 100  CO2 25 26 25 24 24   GLUCOSE 57* 171* 204* 183* 98  BUN 35* 36* 42* 46* 53*  CREATININE 1.51* 1.76* 1.63* 1.71* 1.60*  CALCIUM 8.8* 8.7* 8.6* 8.4* 8.6*   Liver Function Tests: No results for input(s): AST, ALT, ALKPHOS, BILITOT, PROT, ALBUMIN in the last 168 hours. No results for input(s): LIPASE, AMYLASE in the  last 168 hours. No results for input(s): AMMONIA in the last 168 hours. CBC: Recent Labs  Lab 06/24/20 1350 06/26/20 1047 06/27/20 0452 06/28/20 1102  WBC 3.2* 5.0 6.0 10.8*  NEUTROABS 2.7 3.3 4.3 9.9*  HGB 12.2* 11.9* 12.3* 11.8*  HCT 35.9* 34.8* 35.8* 34.7*  MCV 96.0 93.8 93.0 93.8  PLT 79* 80* 82* 101*   Cardiac Enzymes: No results for input(s): CKTOTAL, CKMB, CKMBINDEX, TROPONINI in the last 168 hours. BNP: Invalid input(s): POCBNP CBG: Recent Labs  Lab 06/27/20 0615 06/27/20 1209 06/27/20 1625 06/27/20 2146 06/28/20 0601  GLUCAP 104* 217* 189* 76 151*   D-Dimer No results for input(s): DDIMER in the last 72 hours. Hgb A1c No results for input(s): HGBA1C in the last 72 hours. Lipid Profile No results for input(s): CHOL, HDL, LDLCALC, TRIG, CHOLHDL, LDLDIRECT in the last 72 hours. Thyroid function studies No results for input(s): TSH, T4TOTAL, T3FREE, THYROIDAB in the last 72 hours.  Invalid input(s): FREET3 Anemia work up No results for input(s): VITAMINB12, FOLATE, FERRITIN, TIBC, IRON, RETICCTPCT in the last 72 hours. Urinalysis    Component Value Date/Time   COLORURINE YELLOW 06/16/2020 2030   [place] 06/04/2020 2030   [place] 1.014 06/07/2020 2030   [place] 5.0 06/22/2020 2030   GLUCOSEU NEGATIVE 07/04/2020 2030   [place] NEGATIVE 07/02/2020 2030   [place] 06/05/2020 2030   [place] 06/21/2020 2030   PROTEINUR 100 (A) 06/26/2020 2030   UROBILINOGEN 0.2 04/02/2015 1341   NITRITE NEGATIVE 06/09/2020 2030   LEUKOCYTESUR  NEGATIVE 06/04/2020 2030   Sepsis Labs Invalid input(s): PROCALCITONIN,  WBC,  LACTICIDVEN Microbiology Recent Results (from the past 240 hour(s))  Respiratory Panel by RT PCR (Flu A&B, Covid) - Nasopharyngeal Swab     Status: None   Collection Time: 06/20/2020  8:33 PM   Specimen: Nasopharyngeal Swab  Result Value Ref Range Status   SARS Coronavirus 2 by RT PCR NEGATIVE NEGATIVE Final    Comment: (NOTE) SARS-CoV-2 target nucleic acids are NOT DETECTED.  The SARS-CoV-2 RNA is generally detectable in upper respiratoy specimens during the acute phase of infection. The lowest concentration of SARS-CoV-2 viral copies this assay can detect is 131 copies/mL. A negative result does not preclude SARS-Cov-2 infection and should not be used as the sole basis for treatment or other patient management decisions. A negative result may occur with  improper specimen collection/handling, submission of specimen other than nasopharyngeal swab, presence of viral mutation(s) within the areas targeted by this assay, and inadequate number of viral copies (<131 copies/mL). A negative result must be combined with clinical observations, patient history, and epidemiological information. The expected result is Negative.  Fact Sheet for Patients:  PinkCheek.be  Fact Sheet for Healthcare Providers:  GravelBags.it  This test is no t yet approved or cleared by the [place] FDA and  has been authorized for detection and/or diagnosis of SARS-CoV-2 by FDA under an Emergency Use Authorization (EUA). This EUA will remain  in effect (meaning this test can be used) for the duration of the COVID-19 declaration under Section 564(b)(1) of the Act, 21 U.S.C. section 360bbb-3(b)(1), unless the authorization is terminated or revoked sooner.     Influenza A by PCR NEGATIVE NEGATIVE Final   Influenza B by PCR NEGATIVE NEGATIVE Final    Comment: (NOTE) The  Xpert Xpress SARS-CoV-2/FLU/RSV assay is intended as an aid in  the diagnosis of influenza from Nasopharyngeal swab specimens and  should not be used as a sole basis for treatment.  Nasal washings and  aspirates are unacceptable for Xpert Xpress SARS-CoV-2/FLU/RSV  testing.  Fact Sheet for Patients: PinkCheek.be  Fact Sheet for Healthcare Providers: GravelBags.it  This test is not yet approved or cleared by the [place] FDA and  has been authorized for detection and/or diagnosis of SARS-CoV-2 by  FDA under an Emergency Use Authorization (EUA). This EUA will remain  in effect (meaning this test can be used) for the duration of the  Covid-19 declaration under Section 564(b)(1) of the Act, 21  U.S.C. section 360bbb-3(b)(1), unless the authorization is  terminated or revoked. Performed at [facility], [place] [address], [place], [place] [address]      Time coordinating discharge: Over 30 minutes  SIGNED:   Charlynne Cousins, MD  [facility] 06/28/2020, 12:05 PM Pager   If 7PM-7AM, please contact night-coverage [email or web address] Password [email or web address]

## 2020-06-28 NOTE — Progress Notes (Signed)
Physical Therapy Treatment Patient Details Name: Vernon Briggs MRN: [id number] DOB: 23-Jan-1946 Today's Date: 06/28/2020    History of Present Illness 74 year old man 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 presented with worsening shortness of breath and swelling.  Admitted for acute on chronic systolic CHF    PT Comments    Pt seen asleep in bed with CPAP and wife present with wife upset due to pt's lack of participation in therapy; case manager present to discuss discharge planning, wife and pt requiring increased education from therapist regarding recommendation to SNF to increase independence with strength with functional mobility prior to d/c home; wife stated she can't help pt due to medical issues; following education pt and wife agreeable to SNF; attempted to perform therapy with pt, pt became very agitated b/c he didn't have any milk and refused to participate in session; pt continues to be limited in strength, endurance, coordination and will benefit from skilled PT to address deficits     Follow Up Recommendations  SNF     Equipment Recommendations  None recommended by PT    Recommendations for Other Services       Precautions / Restrictions Restrictions Weight Bearing Restrictions: No    Mobility  Bed Mobility                  Transfers                    Ambulation/Gait                 Stairs             Wheelchair Mobility    Modified Rankin (Stroke Patients Only)       Balance                                            Cognition                                              Exercises      General Comments        Pertinent Vitals/Pain      Home Living                      Prior Function            PT Goals (current goals can now be found in the care plan section) Acute  Rehab PT Goals Patient Stated Goal: I need to move more PT Goal Formulation: With patient/family Time For Goal Achievement: 07/05/20 Potential to Achieve Goals: Fair Progress towards PT goals: Not progressing toward goals - comment (pt agitated he didn't have milk and refused to participate)    Frequency    Min 2X/week      PT Plan Frequency needs to be updated    Co-evaluation              AM-PAC PT "6 Clicks" Mobility   Outcome Measure                   End of Session   Activity Tolerance: Treatment limited secondary to agitation;Patient limited by lethargy  Nurse Communication: Mobility status PT Visit Diagnosis: Unsteadiness on feet (R26.81);Other abnormalities of gait and mobility (R26.89);Muscle weakness (generalized) (M62.81);History of falling (Z91.81)     Time: 2158-7276 PT Time Calculation (min) (ACUTE ONLY): 13 min  Charges:  $Therapeutic Activity: 8-22 mins                     Lyanne Co, DPT Acute Rehabilitation Services [phone]   [place] 06/28/2020, 11:22 AM

## 2020-06-28 NOTE — TOC Progression Note (Addendum)
Transition of Care [facility]) - Progression Note    Patient Details  Name: Vernon Briggs MRN: [id number] Date of Birth: 1946-03-10  Transition of Care [facility]) CM/SW Contact  Zenon Mayo, RN Phone Number: 06/28/2020, 12:26 PM  Clinical Narrative:    NCM spoke with wife and patient at bedside this am.  Wife states she has cancer and she is sick,  Patient states he would like to go to SNF instead of home with [facility] due to wife being sick.  NCM informed CSW of this information , she came to speak with wife and patient about SNF.  This NCM notified Helene Kelp with [facility] to cancel [facility] services.   Expected Discharge Plan: [place] Barriers to Discharge: Continued Medical Work up  Expected Discharge Plan and Services Expected Discharge Plan: [place] In-house Referral: NA Discharge Planning Services: CM Consult Post Acute Care Choice: [place], Resumption of Svcs/PTA Provider Living arrangements for the past 2 months: Single Family Home Expected Discharge Date: 06/28/20                 DME Agency: NA       HH Arranged: RN, PT, OT, Disease Management, Social Work [employer] Agency: [facility] [facility] [employer] Date [place]: 06/22/20 Time [place]: 1618 Representative spoke with at [place]: [place] ([place]) Interventions Food Insecurity Interventions: Intervention Not Indicated  Readmission Risk Interventions Readmission Risk Prevention Plan 06/22/2020  Transportation Screening Complete  HRI or [place] Complete  Social Work Consult for [place] Planning/Counseling [place] Not Applicable  Medication Review Press photographer) Complete  Some recent data might be hidden

## 2020-06-28 NOTE — Progress Notes (Signed)
Heart Failure Stewardship Pharmacist Progress Note   PCP: [name], MD PCP-Cardiologist: Fransico Him, MD    HPI:  74 yo M with PMH of CHF, CAD, type 2 diabetes, afib, and prior stroke. He presented to the ED on 06/11/2020 with acute on chronic CHF. ECHO done on 06/20/20 and LVEF is 25-30% (stable from prior ECHO in March 2021).  Current HF Medications: Torsemide 40 mg daily Carvedilol 3.125 mg BID Farxiga 10 mg daily Imdur 60 mg daily Hydralazine 25 mg q6h  Prior to admission HF Medications: Torsemide 20 mg daily Carvedilol 3.125 mg BID Imdur 30 mg daily  Pertinent Lab Values:  Serum creatinine 1.6, BUN 53, Potassium 3.6, Sodium 137, BNP 330.8  Vital Signs:  Weight: 260 lbs (admission weight: 281 lbs)  Blood pressure: ranges from 86/40 to 164/78   Heart rate: 60-80s  Medication Assistance / Insurance Benefits Check: Does the patient have prescription insurance?  Yes Type of insurance plan: [place] [employer]; [place]  Outpatient Pharmacy:  [facility] Is the patient willing to use [place] at discharge? Yes   Assessment: 1. Acute on chronic systolic CHF (EF 48-01%), due to ischemic cardiomyopathy. NYHA class II symptoms. Discharge to SNF today. - Continue torsemide 40 mg daily - Continue carvedilol 3.125 mg BID - No ACE/ARB/ARNI or spironolactone (was previously on lisinopril and spironolactone but these were stopped due to AKI last admission). - Continue Farxiga 10 mg daily - Continue Imdur 60 mg daily - Continue hydralazine 25 mg q6h. Could decrease frequency to q8h if concerned for hypotension   Plan: 1) Medication changes recommended at this time: - BP low on last check - could decrease hydralazine to 25 mg q8h on discharge  Kerby Nora, PharmD, BCPS Heart Failure Stewardship Pharmacist Phone [phone]

## 2020-06-28 NOTE — TOC Initial Note (Signed)
Transition of Care [facility]) - Initial/Assessment Note    Patient Details  Name: Vernon Briggs MRN: [id number] Date of Birth: 21-Jun-1946  Transition of Care [facility]) CM/SW Contact:    Loreta Ave, [place] Phone Number: 06/28/2020, 3:21 PM  Clinical Narrative:                 CSW received consult for possible SNF placement at time of discharge. CSW spoke with patient and his wife [name] at bedside regarding PT recommendation of SNF placement at time of discharge. Patient reported that patient's spouse is currently unable to care for patient at their home given patient's current physical needs and fall risk. Patient expressed understanding of PT recommendation and is agreeable to SNF placement at time of discharge. Patient reports preference for a SNF near the [place] area. CSW discussed insurance authorization process and provided Medicare SNF ratings list. Patient has not received the COVID vaccines. Patient expressed being hopeful for rehab and to feel better soon. No further questions reported at this time. CSW to continue to follow and assist with discharge planning needs.  Expected Discharge Plan: Skilled Nursing Facility Barriers to Discharge: Insurance Authorization, Continued Medical Work up   Patient Goals and CMS Choice Patient states their goals for this hospitalization and ongoing recovery are:: get a lot better CMS Medicare.gov Compare Post Acute Care list provided to:: Patient Represenative (must comment) (Spouse) Choice offered to / list presented to : Adult Children  Expected Discharge Plan and Services Expected Discharge Plan: [place] In-house Referral: Clinical Social Work Discharge Planning Services: CM Consult Post Acute Care Choice: Home Health, Resumption of Svcs/PTA Provider Living arrangements for the past 2 months: Single Family Home Expected Discharge Date: 06/28/20                 DME Agency: NA       HH Arranged: RN, PT, OT, Disease  Management, Social Work [employer] [facility]: [facility] [employer] Date [place]: 06/22/20 Time [place]: 1618 Representative spoke with at [place]: [place] Arrangements/Services Living arrangements for the past 2 months: [place] with:: Spouse Patient language and need for interpreter reviewed:: Yes Do you feel safe going back to the place where you live?: No   Need more help  Need for Family Participation in Patient Care: Yes (Comment) Care giver support system in place?: Yes (comment) Current home services: DME (has walker, cane, motorized scooter, bsc, ramp, walkin shower with grab bars , cpap) Criminal Activity/Legal Involvement Pertinent to Current Situation/Hospitalization: No - Comment as needed  Activities of Daily Living   ADL Screening (condition at time of admission) Patient's cognitive ability adequate to safely complete daily activities?: No Is the patient deaf or have difficulty hearing?: No Does the patient have difficulty seeing, even when wearing glasses/contacts?: No Does the patient have difficulty concentrating, remembering, or making decisions?: Yes Patient able to express need for assistance with ADLs?: Yes Does the patient have difficulty dressing or bathing?: Yes Independently performs ADLs?: No Does the patient have difficulty walking or climbing stairs?: Yes Weakness of Legs: Both Weakness of Arms/Hands: None  Permission Sought/Granted Permission sought to share information with : Case Manager, Customer service manager, Family Supports Permission granted to share information with : Yes, Release of Information Signed  Share Information with NAME: Amrom Ore  Permission granted to share info w AGENCY: SNFs  Permission granted to share info w Relationship: Wife  Permission granted  to share info w Contact Information: [phone]  Emotional Assessment Appearance:: Appears  stated age Attitude/Demeanor/Rapport: Engaged Affect (typically observed): Appropriate Orientation: : Oriented to Self, Oriented to Place, Oriented to  Time, Oriented to Situation Alcohol / Substance Use: Not Applicable Psych Involvement: No (comment)  Admission diagnosis:  Acute systolic (congestive) heart failure (HCC) [I50.21] Acute on chronic congestive heart failure, unspecified heart failure type [facility]) [I50.9] Patient Active Problem List   Diagnosis Date Noted  . OSA (obstructive sleep apnea) 06/20/2020  . Acute hypoxemic respiratory failure ([place]) 06/20/2020  . Acute on chronic systolic CHF (congestive heart failure) ([place]) 06/29/2020  . Benign neoplasm of ascending colon   . Benign neoplasm of transverse colon   . Benign neoplasm of descending colon   . Benign neoplasm of rectum   . Diarrhea   . Volume depletion   . Palliative care by specialist   . Goals of care, counseling/discussion   . Advanced directives, counseling/discussion   . Generalized weakness   . AKI (acute kidney injury) ([place]) 05/05/2020  . Hyperkalemia 05/05/2020  . CKD (chronic kidney disease) stage 3, GFR 30-59 ml/min (HCC) 05/05/2020  . Acute systolic heart failure ([place])   . CKD (chronic kidney disease) stage 2, GFR 60-89 ml/min 11/20/2019  . NSTEMI (non-ST elevated myocardial infarction) ([place]) 11/20/2019  . Obesity 11/20/2019  . Unstable angina ([place])   . Chest pain 04/02/2015  . Acute renal failure ([place]) 04/02/2015  . Weakness generalized 04/02/2015  . Cardiomyopathy, ischemic 09/15/2014  . CAD S/P CFX DES 09/14/14 09/15/2014  . PAF (paroxysmal atrial fibrillation) ([place]) 09/15/2014  . Chronic anticoagulation-Coumadin 09/15/2014  . S/P CABG x 12-1997   . Elevated troponin   . Abnormal nuclear stress test 09/11/2014    Class: Diagnosis of  . Acute coronary syndrome ([place]) 09/09/2014  . Hypertensive urgency 09/09/2014  . Essential hypertension   . History of CVA (cerebrovascular accident) 10/20/2012   . TIA (transient ischemic attack) 10/20/2012  . Automatic implantable cardioverter-defibrillator in situ 10/20/2012  . Type II diabetes mellitus with complication ([place]) 88/87/5797  . Hyperlipidemia with target LDL less than 70 10/20/2012  . Thrombocytopenia-plts 90-70K 10/20/2012   PCP:  [name], MD Pharmacy[facility]   [id number] [place] [place] [address] [place] [address] Phone: [phone] Fax: [place], [place] [address] [place] [address] Phone: [phone] Fax: [phone]     Social Determinants of Health (SDOH) Interventions Food Insecurity Interventions: Intervention Not Indicated  Readmission Risk Interventions Readmission Risk Prevention Plan 06/22/2020  Transportation Screening Complete  HRI or Home Care Consult Complete  Social Work Consult for [place] Planning/Counseling Complete  Palliative Care Screening Not Applicable  Medication Review Press photographer) Complete  Some recent data might be hidden

## 2020-06-28 NOTE — Progress Notes (Signed)
Received report this AM that patient had short run of VTach; AICD in place. Additionally, 6 beats of wide QRS this am with no additional ectopy. Patient has hydralazine scheduled but it was held twice today as his SBP dropped into the upper 70s for most of the day after his first dose; up to 116 after dinner. Please monitor for continued need. Patient has been complaining of neck and head pain all day despite tylenol. New order for Tramadol received and given to patient. When went to follow up, patient was relaxed and sleeping; however, as soon as he noted someone was in the room he began crying out "Hurt! Hurt!" Patient does not speak in full sentences but demands things with one word. However, when speaking to the patient he will, in fact, speak in full sentences; there is a discrepancy between how he presents and what he is actually capable of. This evening he was being extremely harsh with his wife; I got stern with him and told him he needs to get himself together and understand that we are working in his best interest. I also told him that he is capable of doing more for himself than he does and I insisted he do so, which he then began doing so. I urged his wife to go home as she is extremely overwhelmed with him, in tears the entire day when she is here. She did leave and he calmed down significantly. I urged spouse to find support and to have the difficult discussions with the patient about what he wants, then to exam what is feasible.

## 2020-06-28 NOTE — NC FL2 (Signed)
[place] LEVEL OF CARE SCREENING TOOL     IDENTIFICATION  Patient Name: Vernon Briggs Birthdate: Jun 24, 1946 Sex: male Admission Date (Current Location): 06/26/2020  [facility] and [place] Number:  Herbalist and Address:  [facility], [place] [address] [place], [place] [address]      Provider Number: [id number]  Attending Physician Name and Address:  Aileen Fass, Tammi Klippel, MD  Relative Name and Phone Number:  Jyair Kiraly [phone]    Current Level of Care: Hospital Recommended Level of Care: [place] Prior Approval Number:    Date Approved/Denied:   PASRR Number: [id number]  Discharge Plan: SNF    Current Diagnoses: Patient Active Problem List   Diagnosis Date Noted   OSA (obstructive sleep apnea) 06/20/2020   Acute hypoxemic respiratory failure ([place]) 06/20/2020   Acute on chronic systolic CHF (congestive heart failure) ([place]) 06/14/2020   Benign neoplasm of ascending colon    Benign neoplasm of transverse colon    Benign neoplasm of descending colon    Benign neoplasm of rectum    Diarrhea    Volume depletion    Palliative care by specialist    Goals of care, counseling/discussion    Advanced directives, counseling/discussion    Generalized weakness    AKI (acute kidney injury) ([place]) 05/05/2020   Hyperkalemia 05/05/2020   CKD (chronic kidney disease) stage 3, GFR 30-59 ml/min (HCC) 10/62/6948   Acute systolic heart failure (HCC)    CKD (chronic kidney disease) stage 2, GFR 60-89 ml/min 11/20/2019   NSTEMI (non-ST elevated myocardial infarction) ([place]) 11/20/2019   Obesity 11/20/2019   Unstable angina (HCC)    Chest pain 04/02/2015   Acute renal failure ([place]) 04/02/2015   Weakness generalized 04/02/2015   Cardiomyopathy, ischemic 09/15/2014   CAD S/P CFX DES 09/14/14 09/15/2014   PAF (paroxysmal atrial fibrillation) ([place]) 09/15/2014   Chronic  anticoagulation-Coumadin 09/15/2014   S/P CABG x 12-1997    Elevated troponin    Abnormal nuclear stress test 09/11/2014   Acute coronary syndrome ([place]) 09/09/2014   Hypertensive urgency 09/09/2014   Essential hypertension    History of CVA (cerebrovascular accident) 10/20/2012   TIA (transient ischemic attack) 10/20/2012   Automatic implantable cardioverter-defibrillator in situ 10/20/2012   Type II diabetes mellitus with complication ([place]) 54/62/7035   Hyperlipidemia with target LDL less than 70 10/20/2012   Thrombocytopenia-plts 90-70K 10/20/2012    Orientation RESPIRATION BLADDER Height & Weight     Self, Time, Situation, Place  Other (Comment) (CPAP-Nasal Mask) Continent Weight: 260 lb 12.9 oz (118.3 kg) Height:     BEHAVIORAL SYMPTOMS/MOOD NEUROLOGICAL BOWEL NUTRITION STATUS      Continent Diet (See dc summary)  AMBULATORY STATUS COMMUNICATION OF NEEDS Skin   Extensive Assist Verbally Other (Comment) (Open wound 06/26/20 elbow anterior)                       Personal Care Assistance Level of Assistance  Bathing, Feeding, Dressing Bathing Assistance: Limited assistance Feeding assistance: Limited assistance Dressing Assistance: Limited assistance     Functional Limitations Info  Sight, Hearing, Speech Sight Info: Impaired Hearing Info: Impaired Speech Info: Adequate    SPECIAL CARE FACTORS FREQUENCY  PT (By licensed PT), OT (By licensed OT)     PT Frequency: 5x week OT Frequency: 5x week            Contractures Contractures Info: Not present    Additional Factors Info  Code Status,  Allergies, Psychotropic, Insulin Sliding Scale Code Status Info: DNR Allergies Info: Tape Psychotropic Info: FLUoxetine (PROZAC) capsule 40 mg at bedtime Insulin Sliding Scale Info: insulin aspart (novoLOG) injection 0-15 Units 3x daily with meals, insulin aspart (novoLOG) injection 0-5 Units 3x daily with meals, insulin regular human CONCENTRATED (HUMULIN R)  500 UNIT/ML kwikpen 50 Units 2x daily       Current Medications (06/28/2020):  This is the current hospital active medication list Current Facility-Administered Medications  Medication Dose Route Frequency Provider Last Rate Last Admin   0.9 %  sodium chloride infusion  250 mL Intravenous PRN Sid Falcon, MD       acetaminophen (TYLENOL) tablet 650 mg  650 mg Oral Q4H PRN Gilles Chiquito B, MD   650 mg at 06/28/20 1533   apixaban (ELIQUIS) tablet 5 mg  5 mg Oral BID Gilles Chiquito B, MD   5 mg at 06/28/20 0909   carvedilol (COREG) tablet 3.125 mg  3.125 mg Oral BID WC Opyd, Ilene Qua, MD   3.125 mg at 06/28/20 0909   cholestyramine (QUESTRAN) packet 4 g  4 g Oral Q1400 Gilles Chiquito B, MD   4 g at 06/27/20 1423   clopidogrel (PLAVIX) tablet 75 mg  75 mg Oral Daily Gilles Chiquito B, MD   75 mg at 06/28/20 0908   dapagliflozin propanediol (FARXIGA) tablet 10 mg  10 mg Oral Daily Sande Rives E, PA-C   10 mg at 06/28/20 0908   FLUoxetine (PROZAC) capsule 40 mg  40 mg Oral QHS Gilles Chiquito B, MD   40 mg at 06/27/20 2213   Gerhardt's butt cream   Topical TID Charlynne Cousins, MD   Given at 06/28/20 1536   hydrALAZINE (APRESOLINE) tablet 25 mg  25 mg Oral Q6H Opyd, Ilene Qua, MD   25 mg at 06/28/20 0537   insulin aspart (novoLOG) injection 0-15 Units  0-15 Units Subcutaneous TID WC Gilles Chiquito B, MD   3 Units at 06/27/20 1645   insulin aspart (novoLOG) injection 0-5 Units  0-5 Units Subcutaneous QHS Gilles Chiquito B, MD   3 Units at 06/21/20 2222   insulin aspart (novoLOG) injection 10 Units  10 Units Subcutaneous TID WC Charlynne Cousins, MD   10 Units at 06/28/20 1300   insulin regular human CONCENTRATED (HUMULIN R) 500 UNIT/ML kwikpen 50 Units  50 Units Subcutaneous BID WC Charlynne Cousins, MD   50 Units at 06/28/20 0910   ipratropium-albuterol (DUONEB) 0.5-2.5 (3) MG/3ML nebulizer solution 3 mL  3 mL Nebulization TID PRN Samuella Cota, MD   3 mL at 06/24/20  0449   isosorbide mononitrate (IMDUR) 24 hr tablet 60 mg  60 mg Oral Daily Belva Crome, MD   60 mg at 06/28/20 2353   loperamide (IMODIUM) capsule 2 mg  2 mg Oral Daily PRN Sid Falcon, MD       ondansetron [facility]) injection 4 mg  4 mg Intravenous Q6H PRN Gilles Chiquito B, MD       rosuvastatin (CRESTOR) tablet 20 mg  20 mg Oral QHS Gilles Chiquito B, MD   20 mg at 06/27/20 2213   sodium chloride flush (NS) 0.9 % injection 3 mL  3 mL Intravenous Q12H Gilles Chiquito B, MD   3 mL at 06/28/20 0915   sodium chloride flush (NS) 0.9 % injection 3 mL  3 mL Intravenous PRN Sid Falcon, MD       torsemide [facility]) tablet 40 mg  40 mg Oral Daily Charlynne Cousins, MD   40 mg at 06/28/20 0908     Discharge Medications: Please see discharge summary for a list of discharge medications.  Relevant Imaging Results:  Relevant Lab Results:   Additional Information SSN [id number]  Loreta Ave, LCSWA

## 2020-06-28 NOTE — Care Management Important Message (Signed)
Important Message  Patient Details  Name: Vernon Briggs MRN: [id number] Date of Birth: 12-25-1945   Medicare Important Message Given:  Yes     Shelda Altes 06/28/2020, 9:26 AM

## 2020-06-28 NOTE — Progress Notes (Addendum)
   06/28/20 0005  Provider Notification  Provider Name/Title Dr Myna Hidalgo  Date Provider Notified 06/28/20  Time Provider Notified 0005  Notification Type Page  Notification Reason Other (Comment) (Hold hydaralzine po for midnight d/t BP 93/44)   Update: HR and BP parameters has been added by Dr Myna Hidalgo. Vernon Briggs

## 2020-06-28 NOTE — Progress Notes (Addendum)
   06/28/20 0334  Provider Notification  Provider Name/Title Dr Myna Hidalgo  Date Provider Notified 06/28/20  Time Provider Notified [phone]  Notification Type Page  Notification Reason Other (Comment) (11 beats of vtach)    Update: MD ordered BMP, mg 1 gr PIV x1, K 18meq po x1.

## 2020-06-29 DIAGNOSIS — I5023 Acute on chronic systolic (congestive) heart failure: Secondary | ICD-10-CM | POA: Diagnosis not present

## 2020-06-29 LAB — GLUCOSE, CAPILLARY
Glucose-Capillary: 219 mg/dL — ABNORMAL HIGH (ref 70–99)
Glucose-Capillary: 170 mg/dL — ABNORMAL HIGH (ref 70–99)
Glucose-Capillary: 193 mg/dL — ABNORMAL HIGH (ref 70–99)
Glucose-Capillary: 130 mg/dL — ABNORMAL HIGH (ref 70–99)

## 2020-06-29 LAB — RESPIRATORY PANEL BY RT PCR (FLU A&B, COVID)
Influenza A by PCR: NEGATIVE
Influenza B by PCR: NEGATIVE
SARS Coronavirus 2 by RT PCR: NEGATIVE

## 2020-06-29 MED ORDER — DICLOFENAC SODIUM 1 % EX GEL
2.0000 g | Freq: Four times a day (QID) | CUTANEOUS | Status: DC
  Administered 2020-06-29 – 2020-07-02 (×11): 2 g via TOPICAL
  Filled 2020-06-29 (×2): qty 100

## 2020-06-29 NOTE — Progress Notes (Signed)
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  Admitted for acute on chronic systolic CHF    PT Comments    Pt was originally canceled but agreed shortly after to therapy and was seen for gait to the chair.  Required a lot of sequencing much less so than actual physical assist.  Used RW for support, cued direction and safety of hand placement.  Pt is up in chair with some complaints of chronic pain, repositioned his arms and legs with pillows and legrest.  Follow acutely as tolerated for goals of PT.   Follow Up Recommendations  SNF     Equipment Recommendations  None recommended by PT    Recommendations for Other Services       Precautions / Restrictions Precautions Precautions: Fall Precaution Comments: falls at home Restrictions Weight Bearing Restrictions: No    Mobility  Bed Mobility Overal bed mobility: Needs Assistance Bed Mobility: Rolling;Supine to Sit Rolling: Min guard   Supine to sit: Min guard;Min assist     General bed mobility comments: pt was prompted to use LUE to reach for bed rail and then physically cued for sequence  Transfers Overall transfer level: Needs assistance Equipment used: Rolling walker (2 wheeled);1 person hand held assist Transfers: Sit to/from Stand Sit to Stand: Mod assist Stand pivot transfers: Mod assist       General transfer comment: mod with cued hand placement and cues for capturing initial standing balance  Ambulation/Gait Ambulation/Gait assistance: Min guard;Min assist Gait Distance (Feet): 6 Feet Assistive device: Rolling walker (2 wheeled) Gait Pattern/deviations: Step-to pattern;Decreased stride length;Wide  base of support;Trunk flexed Gait velocity: reduced Gait velocity interpretation: <1.31 ft/sec, indicative of household ambulator General Gait Details: shifting feet and densely cued for direction and use of walker   Stairs             Wheelchair Mobility    Modified Rankin (Stroke Patients Only)       Balance Overall balance assessment: Needs assistance Sitting-balance support: Feet supported Sitting balance-Leahy Scale: Fair     Standing balance support: Bilateral upper extremity supported;During functional activity Standing balance-Leahy Scale: Poor Standing balance comment: RW was his previous device at home but does not voluntarily use it                            Cognition Arousal/Alertness: Awake/alert Behavior During Therapy: Anxious Overall Cognitive Status: Impaired/Different from baseline Area of Impairment: Problem solving;Awareness;Safety/judgement;Following commands;Memory;Attention;Orientation                 Orientation Level: Situation;Time Current Attention Level: Selective Memory: Decreased recall of precautions;Decreased short-term memory Following Commands: Follows one step commands inconsistently;Follows one step commands with increased time Safety/Judgement: Decreased awareness of safety;Decreased awareness of deficits Awareness: Intellectual Problem Solving: Slow processing;Requires verbal cues;Requires tactile cues General Comments: self limiting but is actually very capable of moving with encouragement of staff      Exercises      General Comments General comments (skin integrity, edema, etc.): Pt is up to transfer to chair with cues and assist with lines, very limited in his presentation but when actually moving is quite able to assist  Pertinent Vitals/Pain Pain Assessment: Faces Faces Pain Scale: Hurts even more Pain Location: generalized including neck and LE's Pain Descriptors / Indicators:  Guarding;Grimacing Pain Intervention(s): Monitored during session;Premedicated before session;Repositioned    Home Living                      Prior Function            PT Goals (current goals can now be found in the care plan section) Acute Rehab PT Goals Patient Stated Goal: to try to walk Progress towards PT goals: Progressing toward goals    Frequency    Min 2X/week      PT Plan Current plan remains appropriate    Co-evaluation              AM-PAC PT "6 Clicks" Mobility   Outcome Measure  Help needed turning from your back to your side while in a flat bed without using bedrails?: A Little Help needed moving from lying on your back to sitting on the side of a flat bed without using bedrails?: A Lot Help needed moving to and from a bed to a chair (including a wheelchair)?: A Lot Help needed standing up from a chair using your arms (e.g., wheelchair or bedside chair)?: A Lot Help needed to walk in hospital room?: A Lot Help needed climbing 3-5 steps with a railing? : Total 6 Click Score: 12    End of Session Equipment Utilized During Treatment: Gait belt;Oxygen Activity Tolerance: Treatment limited secondary to agitation;Patient limited by lethargy Patient left: with call bell/phone within reach;in chair;with nursing/sitter in room Nurse Communication: Mobility status PT Visit Diagnosis: Unsteadiness on feet (R26.81);Other abnormalities of gait and mobility (R26.89);Muscle weakness (generalized) (M62.81);History of falling (Z91.81)     Time: 9794-8016 PT Time Calculation (min) (ACUTE ONLY): 30 min  Charges:  $Gait Training: 8-22 mins $Therapeutic Activity: 8-22 mins                   Ramond Dial 06/29/2020, 5:54 PM  Mee Hives, PT MS Acute Rehab Dept. Number: [place] and [place]

## 2020-06-29 NOTE — Progress Notes (Signed)
Patient complains of being unable to move his right arm and that his side feels weak. Patient lifted both arms and showed no signs of weakness. Patients' speech was baseline, was able to answer questions, and could identify stimuli on the left and right arms and legs. Patient did not have facial droop or slurred speech.

## 2020-06-29 NOTE — Progress Notes (Signed)
Patient is A&O x 3 but continues to use one word to explain his needs, such as "Hurt", "Pain", "Go", "Milk", "Bed", "Chair", etc... Additionally, patient continues to choose to be incontinent despite staff frequently offering the urinal and bedside commode; he occasionally uses these tools. Bilateral lungs are clear but diminished throughout, bowel sounds active with patient having daily or almost daily BMS. Edema to BLE is trace to 1+. Patient continues to complain of neck and shoulder pain; new order for voltaren gel obtained and initiated today. This, combined with the PRN tramadol, appear to be providing some relief, as patient has been able to sleep, despite his insistence he has not; I have walked in on him several times after providing these two interventions were administered and found him sound asleep. Patient also continues to have improved BP; noon hydralazine not given due to normotensive SBP reading of 116; yesterday when this med was given patient's SBP dropped into the 70s and did not return to normal until the evening. Will continue to monitor for continued need/appropriateness of this med. Patient continues to exhibit 'behaviors' which worsen in the presence of his wife. For instance, patient screams out in pain when someone touches him near his shoulders or instructs him to adjust his position, but if unsolicited, he will move his neck and reposition himself with ease and without complaint. It has been found that a stern approach works better with this patient than an overly sympathetic one. Plan is to DC to a SNF for rehab; wife needs to make a decision on facility. PCR/respiratory panel completed and all were negative. Will continue to monitor.

## 2020-06-29 NOTE — Progress Notes (Signed)
Heart Failure Stewardship Pharmacist Progress Note   PCP: [name], MD PCP-Cardiologist: Fransico Him, MD    HPI:  74 yo M with PMH of CHF, CAD, type 2 diabetes, afib, and prior stroke. He presented to the ED on 07/01/2020 with acute on chronic CHF. ECHO done on 06/20/20 and LVEF is 25-30% (stable from prior ECHO in March 2021).  Current HF Medications: Torsemide 40 mg daily Carvedilol 3.125 mg BID Farxiga 10 mg daily Imdur 60 mg daily Hydralazine 25 mg q6h  Prior to admission HF Medications: Torsemide 20 mg daily Carvedilol 3.125 mg BID Imdur 30 mg daily  Pertinent Lab Values: . Serum creatinine 1.92, BUN 52, Potassium 3.5, Sodium 136, BNP 330.8  Vital Signs: . Weight: 253 lbs (admission weight: 281 lbs) . Blood pressure: 100-140/70s  . Heart rate: 70s  Assessment: 1. Acute on chronic systolic CHF (EF 30-07%), due to ischemic cardiomyopathy. NYHA class II symptoms. Discharge pending placement to SNF. - Continue torsemide 40 mg daily - Continue carvedilol 3.125 mg BID - No ACE/ARB/ARNI or spironolactone (was previously on lisinopril and spironolactone but these were stopped due to AKI last admission). - Continue Farxiga 10 mg daily - Continue Imdur 60 mg daily - Continue hydralazine 25 mg q6h  Plan: 1) Medication changes recommended at this time: - None at this time  Kerby Nora, PharmD, BCPS Heart Failure Stewardship Pharmacist Phone [phone]

## 2020-06-29 NOTE — Progress Notes (Signed)
PT Cancellation Note  Patient Details Name: Vernon Briggs MRN: [id number] DOB: 22-Mar-1946   Cancelled Treatment:    Reason Eval/Treat Not Completed: Other (comment).  Request was made to have a PT session bt pt declined. Offered options for scaling visit to bedside vs in bed to do ex, and pt declined all options.  Sent message to team, spoke with nursing.  Follow up as time and pt allow.   Ramond Dial 06/29/2020, 4:38 PM   Mee Hives, PT MS Acute Rehab Dept. Number: [place] and [place]

## 2020-06-29 NOTE — Progress Notes (Signed)
Patient was resting quietly with eyes closed, possibly asleep, after administering PRN APAP 650mg  and application of Voltaren Gel at around 1400. Just now patient was heard yelling: "Pain! Pain!" He continues to offer no additional descriptions other than "Pain, hurt". He does not offer descriptors and will agree with any suggestion that is put forth. Wife is at bedside and offered that he sometimes gets "cluster" headaches of a sort, but that improve with "fast-acting 500mg  Tylenol". I assured her we are providing him with 650mg  acetaminophen and tramadol. Both of which are stronger than the 500mg . I then asked if patient talks like this at home, in one work 'phrases' or if he was more conversational. She said he was conversational at home and able to self perform ADLs with minimal difficulty with assist of walker. During this conversation, it was apparent that the spouse has an unrealistic perception and expectations of the current situation and attempts at clarifying and explaining only caused increased stress and an emotional response. Therefore, the appropriate needed support was provided.

## 2020-06-29 NOTE — Progress Notes (Signed)
1800 Hydralazine dose held for SBP of 90. This is after patient received routine scheduled coreg 3.125; SBP went from 116 to 90. MD notified

## 2020-06-29 NOTE — Progress Notes (Signed)
[facility] PROGRESS NOTE    Progress Note  Vernon Briggs  TGG:[id number] DOB: 11/24/1945 DOA: 06/15/2020 PCP: [name], MD     Brief Narrative:   Vernon Briggs is an 74 y.o. 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  Assessment/Plan:   Acute respiratory failure with hypoxia secondarily to acute on chronic systolic CHF (congestive heart failure): In the context of recently decreased torsemide and discontinuation of Aldactone. We will continue Crestor, Plavix, Imdur, beta-blocker, Farxiga and hydralazine. Patient is medically stable for transfer awaiting transfer to skilled nursing facility.  Elevated troponin's: Continue Plavix, Eliquis, Coreg, indoor and Crestor. Likely demand ischemia in the setting of acute systolic heart failure.  Diabetes mellitus type 2 with complications: Continue long-acting insulin U5 100+ sliding scale.  Episode of hypoglycemia this morning decreased U5 100.  Chronic kidney disease stage IIIb: Ending appears to be at baseline continue to monitor closely with diuresis. Creatinine today 1.7.  See above for further details.  Asthma atrial fibrillation: Paced rhythm, on Coreg and apixaban.  Obstructive sleep apnea: Continue CPAP.  DVT prophylaxis: Apixiban Family Communication:none Status is: Inpatient  Remains inpatient appropriate because:Hemodynamically unstable   Dispo: The patient is from: Home              Anticipated d/c is to: Skilled nursing facility              Anticipated d/c date is: 1 days              Patient currently is medically stable to d/c.  Awaiting skilled nursing facility placement.   Code Status:     Code Status Orders  (From admission, onward)         Start     Ordered   06/14/2020 2152  Do not attempt  resuscitation (DNR)  Continuous       Question Answer Comment  In the event of cardiac or respiratory ARREST Do not call a "code blue"   In the event of cardiac or respiratory ARREST Do not perform Intubation, CPR, defibrillation or ACLS   In the event of cardiac or respiratory ARREST Use medication by any route, position, wound care, and other measures to relive pain and suffering. May use oxygen, suction and manual treatment of airway obstruction as needed for comfort.      06/18/2020 2152        Code Status History    Date Active Date Inactive Code Status Order ID Comments User Context   05/05/2020 2342 05/10/2020 2144 DNR [id number]  Etta Quill, DO ED   11/25/2019 1237 11/26/2019 2031 DNR [id number]  Oswald Hillock, MD Inpatient   11/24/2019 1339 11/25/2019 1237 Full Code [id number]  [name], MD Inpatient   11/20/2019 0150 11/24/2019 1338 DNR [id number]  Rise Patience, MD Inpatient   04/02/2015 1725 04/05/2015 1557 DNR [id number]  Melton Alar, PA-C Inpatient   09/14/2014 0952 09/15/2014 1430 Full Code [id number]  Leonie Man, MD Inpatient   09/09/2014 0331 09/14/2014 0952 Full Code [id number]  Lamar Sprinkles, MD Inpatient   10/20/2012 1922 10/22/2012 1421 DNR [id number]  Marzetta Board, MD ED   Advance Care Planning Activity        IV Access:    Peripheral IV   Procedures and diagnostic studies:   No results found.   Medical Consultants:  None.  Anti-Infectives:   none  Subjective:    Vernon Briggs no new complaints this morning.  Objective:    Vitals:   06/28/20 1757 06/28/20 2114 06/28/20 2345 06/29/20 0529  BP: 116/64 (!) 115/45 (!) 103/43 128/65  Pulse:  72  69  Resp:  18  20  Temp:  97.8 F (36.6 C)  98.4 F (36.9 C)  TempSrc:  Oral  Oral  SpO2:  100%  98%  Weight:    115.1 kg   SpO2: 98 % O2 Flow Rate (L/min): 2 L/min   Intake/Output Summary (Last 24 hours) at 06/29/2020 0901 Last data filed at 06/29/2020 0500 Gross per 24 hour   Intake 90 ml  Output 350 ml  Net -260 ml   Filed Weights   06/27/20 0614 06/28/20 0500 06/29/20 0529  Weight: 121.1 kg 118.3 kg 115.1 kg    Exam: General exam: In no acute distress. Respiratory system: Good air movement and clear to auscultation. Cardiovascular system: S1 & S2 heard, RRR. No JVD. Gastrointestinal system: Abdomen is nondistended, soft and nontender.  Extremities: No pedal edema. Skin: No rashes, lesions or ulcers  Data Reviewed:    Labs: Basic Metabolic Panel: Recent Labs  Lab 06/24/20 0314 06/24/20 0314 06/25/20 0957 06/25/20 0957 06/26/20 0444 06/26/20 0444 06/27/20 0452 06/28/20 1102  NA 136  --  136  --  134*  --  137 136  K 3.7   < > 3.4*   < > 3.3*   < > 3.6 3.5  CL 98  --  98  --  99  --  100 100  CO2 26  --  25  --  24  --  24 24  GLUCOSE 171*  --  204*  --  183*  --  98 185*  BUN 36*  --  42*  --  46*  --  53* 52*  CREATININE 1.76*  --  1.63*  --  1.71*  --  1.60* 1.92*  CALCIUM 8.7*  --  8.6*  --  8.4*  --  8.6* 8.2*  MG  --   --   --   --   --   --   --  2.1   < > = values in this interval not displayed.   GFR Estimated Creatinine Clearance: 42.9 mL/min (A) (by C-G formula based on SCr of 1.92 mg/dL (H)). Liver Function Tests: No results for input(s): AST, ALT, ALKPHOS, BILITOT, PROT, ALBUMIN in the last 168 hours. No results for input(s): LIPASE, AMYLASE in the last 168 hours. No results for input(s): AMMONIA in the last 168 hours. Coagulation profile No results for input(s): INR, PROTIME in the last 168 hours. COVID-19 Labs  No results for input(s): DDIMER, FERRITIN, LDH, CRP in the last 72 hours.  Lab Results  Component Value Date   SARSCOV2NAA NEGATIVE 07/02/2020   [place] NEGATIVE 05/05/2020   [place] NEGATIVE 11/19/2019    CBC: Recent Labs  Lab 06/24/20 1350 06/26/20 1047 06/27/20 0452 06/28/20 1102  WBC 3.2* 5.0 6.0 10.8*  NEUTROABS 2.7 3.3 4.3 9.9*  HGB 12.2* 11.9* 12.3* 11.8*  HCT 35.9* 34.8* 35.8*  34.7*  MCV 96.0 93.8 93.0 93.8  PLT 79* 80* 82* 101*   Cardiac Enzymes: No results for input(s): CKTOTAL, CKMB, CKMBINDEX, TROPONINI in the last 168 hours. BNP (last 3 results) No results for input(s): PROBNP in the last 8760 hours. CBG: Recent Labs  Lab 06/28/20 0601 06/28/20 1215 06/28/20 1732 06/28/20 2227 06/29/20 3614  GLUCAP 151* 172* 283* 221* 219*   D-Dimer: No results for input(s): DDIMER in the last 72 hours. Hgb A1c: No results for input(s): HGBA1C in the last 72 hours. Lipid Profile: No results for input(s): CHOL, HDL, LDLCALC, TRIG, CHOLHDL, LDLDIRECT in the last 72 hours. Thyroid function studies: No results for input(s): TSH, T4TOTAL, T3FREE, THYROIDAB in the last 72 hours.  Invalid input(s): FREET3 Anemia work up: No results for input(s): VITAMINB12, FOLATE, FERRITIN, TIBC, IRON, RETICCTPCT in the last 72 hours. Sepsis Labs: Recent Labs  Lab 06/24/20 1350 06/26/20 1047 06/27/20 0452 06/28/20 1102  WBC 3.2* 5.0 6.0 10.8*   Microbiology Recent Results (from the past 240 hour(s))  Respiratory Panel by RT PCR (Flu A&B, Covid) - Nasopharyngeal Swab     Status: None   Collection Time: 06/07/2020  8:33 PM   Specimen: Nasopharyngeal Swab  Result Value Ref Range Status   SARS Coronavirus 2 by RT PCR NEGATIVE NEGATIVE Final    Comment: (NOTE) SARS-CoV-2 target nucleic acids are NOT DETECTED.  The SARS-CoV-2 RNA is generally detectable in upper respiratoy specimens during the acute phase of infection. The lowest concentration of SARS-CoV-2 viral copies this assay can detect is 131 copies/mL. A negative result does not preclude SARS-Cov-2 infection and should not be used as the sole basis for treatment or other patient management decisions. A negative result may occur with  improper specimen collection/handling, submission of specimen other than nasopharyngeal swab, presence of viral mutation(s) within the areas targeted by this assay, and inadequate number  of viral copies (<131 copies/mL). A negative result must be combined with clinical observations, patient history, and epidemiological information. The expected result is Negative.  Fact Sheet for Patients:  PinkCheek.be  Fact Sheet for Healthcare Providers:  GravelBags.it  This test is no t yet approved or cleared by the [place] FDA and  has been authorized for detection and/or diagnosis of SARS-CoV-2 by FDA under an Emergency Use Authorization (EUA). This EUA will remain  in effect (meaning this test can be used) for the duration of the COVID-19 declaration under Section 564(b)(1) of the Act, 21 U.S.C. section 360bbb-3(b)(1), unless the authorization is terminated or revoked sooner.     Influenza A by PCR NEGATIVE NEGATIVE Final   Influenza B by PCR NEGATIVE NEGATIVE Final    Comment: (NOTE) The Xpert Xpress SARS-CoV-2/FLU/RSV assay is intended as an aid in  the diagnosis of influenza from Nasopharyngeal swab specimens and  should not be used as a sole basis for treatment. Nasal washings and  aspirates are unacceptable for Xpert Xpress SARS-CoV-2/FLU/RSV  testing.  Fact Sheet for Patients: PinkCheek.be  Fact Sheet for Healthcare Providers: GravelBags.it  This test is not yet approved or cleared by the [place] FDA and  has been authorized for detection and/or diagnosis of SARS-CoV-2 by  FDA under an Emergency Use Authorization (EUA). This EUA will remain  in effect (meaning this test can be used) for the duration of the  Covid-19 declaration under Section 564(b)(1) of the Act, 21  U.S.C. section 360bbb-3(b)(1), unless the authorization is  terminated or revoked. Performed at [facility], [place] [address], [place], [place] [address]      Medications:   . apixaban  5 mg Oral BID  . carvedilol  3.125 mg Oral BID WC  . cholestyramine  4 g  Oral Q1400  . clopidogrel  75 mg Oral Daily  . dapagliflozin propanediol  10 mg Oral Daily  . FLUoxetine  40 mg  Oral QHS  . Gerhardt's butt cream   Topical TID  . hydrALAZINE  25 mg Oral Q6H  . insulin aspart  0-15 Units Subcutaneous TID WC  . insulin aspart  0-5 Units Subcutaneous QHS  . insulin aspart  10 Units Subcutaneous TID WC  . insulin regular human CONCENTRATED  50 Units Subcutaneous BID WC  . isosorbide mononitrate  60 mg Oral Daily  . rosuvastatin  20 mg Oral QHS  . sodium chloride flush  3 mL Intravenous Q12H  . torsemide  40 mg Oral Daily   Continuous Infusions: . sodium chloride        LOS: 10 days   Charlynne Cousins  [facility]  06/29/2020, 9:01 AM

## 2020-06-30 ENCOUNTER — Inpatient Hospital Stay (HOSPITAL_COMMUNITY): Payer: No Typology Code available for payment source

## 2020-06-30 LAB — GLUCOSE, CAPILLARY
Glucose-Capillary: 56 mg/dL — ABNORMAL LOW (ref 70–99)
Glucose-Capillary: 105 mg/dL — ABNORMAL HIGH (ref 70–99)
Glucose-Capillary: 90 mg/dL (ref 70–99)
Glucose-Capillary: 79 mg/dL (ref 70–99)

## 2020-06-30 MED ORDER — BUTALBITAL-APAP-CAFFEINE 50-325-40 MG PO TABS
1.0000 | ORAL_TABLET | Freq: Four times a day (QID) | ORAL | Status: DC | PRN
  Administered 2020-06-30 (×2): 1 via ORAL
  Filled 2020-06-30 (×2): qty 1

## 2020-06-30 MED ORDER — HYDRALAZINE HCL 25 MG PO TABS
12.5000 mg | ORAL_TABLET | Freq: Three times a day (TID) | ORAL | Status: DC
  Administered 2020-06-30 (×2): 12.5 mg via ORAL
  Filled 2020-06-30 (×4): qty 1

## 2020-06-30 MED ORDER — LORAZEPAM 2 MG/ML IJ SOLN
0.5000 mg | INTRAMUSCULAR | Status: DC | PRN
  Administered 2020-06-30 – 2020-07-01 (×5): 0.5 mg via INTRAVENOUS
  Filled 2020-06-30 (×6): qty 1

## 2020-06-30 NOTE — Progress Notes (Signed)
After denying head pain for me and the physician, patient complained to wife on continues and persistent head painwhen she arrived. Situation was escalted to MD; orders for head CT, fioracet and ativan PRN received. CT complete, waiting for read results. Ativan and fioricet given and patient sleeping soundly after returning from CT. Will continue to support patient and family.

## 2020-06-30 NOTE — Discharge Instructions (Signed)

## 2020-06-30 NOTE — Progress Notes (Signed)
Inpatient Diabetes Program Recommendations  AACE/ADA: New Consensus Statement on Inpatient Glycemic Control (2015)  Target Ranges:  Prepandial:   less than 140 mg/dL      Peak postprandial:   less than 180 mg/dL (1-2 hours)      Critically ill patients:  140 - 180 mg/dL   Lab Results  Component Value Date   GLUCAP 105 (H) 06/30/2020   HGBA1C 7.0 (H) 05/06/2020    Review of Glycemic Control Results for Vernon Briggs, Vernon Briggs (MRN [id number]) as of 06/30/2020 09:11  Ref. Range 06/29/2020 12:03 06/29/2020 17:38 06/29/2020 21:11 06/30/2020 06:16 06/30/2020 06:45  Glucose-Capillary Latest Ref Range: 70 - 99 mg/dL 170 (H) 193 (H) 130 (H) 56 (L) 105 (H)   Note:    It appears patient was hypoglycemic this morning due to receiving Novolog 10 units meal coverage last evening but ate 0% of his dinner.  Please hold meals coverage if patient does not eat at least 50% of meal.  Will continue to follow while inpatient.  Thank you, Reche Dixon, RN, BSN Diabetes Coordinator Inpatient Diabetes Program [phone] (team pager from 8a-5p)

## 2020-06-30 NOTE — Progress Notes (Signed)
Patient continued to have behaviors today, refusing to work with OT, refusing to eat, complaining of pain- today, mostly in the right elbow. Patient did not have any reported injury to cause this acute injury and it is difficult to discern true debility because of his unreliable behavior. There is no swelling in the elbow, no redness, no warmth. CT of head was negative for any acute process, which made spouse feel better. However, she continues to be tearful, insisting something is wrong with him, not wanting to accept his decline. She knows that she cannot care for him at home in this state but is unable to afford long term care or private care givers. Hospice was discussed in detail with the spouse, trying to help her understand that they won't be "controlling" his care as in not considering his or her wishes, but she cannot seem to get past this view. She was highly  Encouraged to accept their help; she stated that they cant be there 24 hours so its not the care he needs. I explained to her that nobody is going to be able to provide care like that for free, so she should be encouraged to take the in home help that she can get, emphasizing there will be someone to help bathe him, transfer him to a chair if he so desired, manage his meds closely, have RN visits approximately twice weekly, and have all the DME that she needs. She continued to skirt around this part of the available support, concentrating on what they WONT have; she tends to do this in conversation and I frequently have to bring her back to focus on "the next step". I encouraged her to go home tonight and truly think about the options in front of her, and while they may not be perfect, they are better than managing this alone. She agreed to do so.

## 2020-06-30 NOTE — Progress Notes (Signed)
CSW received a call from wife of pt inquiring if pt could use his VA benefits since insurance denied SNF placement. CSW reached out to [facility] were pt receives care and was directed to [employer] [phone] ext [phone], not available. CSW sent an email to [email or web address]. CSW will continue to follow.

## 2020-06-30 NOTE — Progress Notes (Addendum)
Heart Failure Stewardship Pharmacist Progress Note   PCP: [name], MD PCP-Cardiologist: Fransico Him, MD    HPI:  74 yo M with PMH of CHF, CAD, type 2 diabetes, afib, and prior stroke. He presented to the ED on 07/02/2020 with acute on chronic CHF. ECHO done on 06/20/20 and LVEF is 25-30% (stable from prior ECHO in March 2021).  Current HF Medications: Torsemide 40 mg daily Carvedilol 3.125 mg BID Farxiga 10 mg daily Imdur 60 mg daily Hydralazine 25 mg q6h  Prior to admission HF Medications: Torsemide 20 mg daily Carvedilol 3.125 mg BID Imdur 30 mg daily  Pertinent Lab Values: . As of 10/25: Serum creatinine 1.92, BUN 52, Potassium 3.5, Sodium 136, BNP 330.8  Vital Signs: . Weight: 253 lbs (admission weight: 281 lbs) . Blood pressure: 90-110/70s  . Heart rate: 60s  Assessment: 1. Acute on chronic systolic CHF (EF 49-75%), due to ischemic cardiomyopathy. NYHA class II symptoms. Discharge pending placement to SNF. - Continue torsemide 40 mg daily - Continue carvedilol 3.125 mg BID - No ACE/ARB/ARNI or spironolactone (was previously on lisinopril and spironolactone but these were stopped due to AKI last admission). - Continue Farxiga 10 mg daily - Continue Imdur 60 mg daily - Hydralazine 25 mg q6h - 3 doses held yesterday for SBP <110. Consider reducing to 12.5 mg q8h and reassess BP tolerability.   Plan: 1) Medication changes recommended at this time: - Reduce hydralazine to 12.5 mg q8h  Kerby Nora, PharmD, BCPS Heart Failure Stewardship Pharmacist Phone [phone]

## 2020-06-30 NOTE — Progress Notes (Signed)
PROGRESS NOTE    Vernon Briggs  YQM:[id number] DOB: Apr 12, 1946 DOA: 06/06/2020 PCP: [name], MD   Brief Narrative:  HPI on 06/20/2020 by Dr. Gilles Chiquito Andra Matsuo is a 74 y.o. male with medical history significant of DM2, H/O stroke, PAF, OSA, HTN, CAD with chronic HFrEF, GERD, anxiety, HLD, HTN who presented with worsening SOB and swelling.  Per patient and his wife, Mr. Duerson was recently in the hospital in September for renal failure and hyperkalemia.  He had some mild SOB at that time. Due to the issues with his kidneys, he had his lisinopril, spironolactone stopped and his torsemide halved to 20mg  daily.  His wife monitors his medications and can attest to these changes.  Since that time he has been weaker at home and has been slowly getting worse over the last week and a half.  Specifically he has been having worsening SOB, worsening lower extremity edema, decreased PO intake, worsening weakness and increasing blood pressure.  She takes his blood pressure daily and noted an increase to the 160s/70s at home.  He further has been having worsening orthopnea and increased PND.  He tried to use his home CPAP today to help and this did not improve his symptoms so they called EMS.  He was placed on oxygen and feels better.  He further has had a mild cough in the hospital and wheezing.  He denies chest pain, fever, chills, nausea, change in vision, headache, abdominal pain, recent falls.  He has had generalized weakness, chronic diarrhea (unchanged) and swelling.    Interim history Patient admitted with respiratory failure, CHF exacerbation.  Now complaining of headache, pending CT scan.  Pending possible SNF placement. Assessment & Plan   Acute respiratory failure with hypoxia secondary acute on chronic systolic heart failure exacerbation -Thought to be secondary to noncompliance with medications -Patient required oxygen upon admission -He had presented with generalized weakness  as well as swelling and shortness of breath -Patient was placed on IV Lasix, 80 mg -Cardiology consulted and appreciated -Patient was weaned off of oxygen and now on room air -He has been transitioned to oral torsemide  Elevated troponin -Suspect demand ischemia secondary to the above -Continue Plavix, Eliquis, Coreg, Imdur, Crestor  Diabetes mellitus, type II -Continue Farxiga, Insulin sliding scale as well as Humulin, CBG monitoring  Chronic kidney disease, stage IIIb -Creatinine appears stable, continue to monitor  Paroxysmal atrial fibrillation -Continue Eliquis, Coreg  Essential hypertension -Stable -continue Coreg, hydralazine, Demadex, Imdur  Obstructive sleep apnea -Continue CPAP  Fever -Patient had fever approximately 1 week ago -UA and CXR were unremarkable for infection -Thought to be secondary to thrombophlebitis and chronic diarrhea -Patient has been afebrile for more than 72 hours  Headache -Per wife, patient has had a headache for 3 days -Patient tells me his headache starts in the back of his head.  Denies any previous head injury, nausea or vomiting, sensitivity. -Suspect this is more tension type headache -Given his history of CVA, will obtain CT head -Have ordered Fioricet along with K pad -Continue to monitor closely  Physical deconditioning -PT and OT recommending SNF -Patient has been unable to work with PT and OT given his pain and headache -Hopefully as his headache improves, he will be able to work more with therapy  DVT Prophylaxis Eliquis  Code Status: DNR  Family Communication: Wife via phone  Disposition Plan:  Status is: Inpatient  Remains inpatient appropriate because:Ongoing diagnostic testing needed not appropriate for outpatient work up and  Unsafe d/c plan   Dispo: The patient is from: Home              Anticipated d/c is to: SNF              Anticipated d/c date is: 2 days              Patient currently is not medically  stable to d/c.   Consultants Cardiology  Procedures  Echocardiogram  Antibiotics   Anti-infectives (From admission, onward)   None      Subjective:   Ryaan Caponi seen and examined today.  Patient initially with no complaints this morning.  Was not very conversant.  Denied chest pain or shortness of breath, abdominal pain, nausea or vomiting.  Stated that he did want a get into the chair. Upon discussion with his wife, patient had been complaining of headache for the last 3 to 4 days.  Objective:   Vitals:   06/29/20 1000 06/29/20 1100 06/29/20 1813 06/30/20 0602  BP: (!) 144/76 116/63 (!) 90/48 (!) 111/52  Pulse:  79  68  Resp:  20  16  Temp:  98.2 F (36.8 C)  97.7 F (36.5 C)  TempSrc:  Oral  Oral  SpO2:    93%  Weight:        Intake/Output Summary (Last 24 hours) at 06/30/2020 1216 Last data filed at 06/30/2020 0600 Gross per 24 hour  Intake 363 ml  Output 650 ml  Net -287 ml   Filed Weights   06/27/20 0614 06/28/20 0500 06/29/20 0529  Weight: 121.1 kg 118.3 kg 115.1 kg    Exam  General: Well developed, chronically ill appearing, NAD  HEENT: NCAT, mucous membranes moist.   Cardiovascular: S1 S2 auscultated, irregular  Respiratory: Diminished breath sounds however clear  Abdomen: Soft, obese, nontender, nondistended, + bowel sounds  Extremities: warm dry without cyanosis clubbing or edema  Neuro: AAOx3, nonfocal  Psych: flat   Data Reviewed: I have personally reviewed following labs and imaging studies  CBC: Recent Labs  Lab 06/24/20 1350 06/26/20 1047 06/27/20 0452 06/28/20 1102  WBC 3.2* 5.0 6.0 10.8*  NEUTROABS 2.7 3.3 4.3 9.9*  HGB 12.2* 11.9* 12.3* 11.8*  HCT 35.9* 34.8* 35.8* 34.7*  MCV 96.0 93.8 93.0 93.8  PLT 79* 80* 82* 497*   Basic Metabolic Panel: Recent Labs  Lab 06/24/20 0314 06/25/20 0957 06/26/20 0444 06/27/20 0452 06/28/20 1102  NA 136 136 134* 137 136  K 3.7 3.4* 3.3* 3.6 3.5  CL 98 98 99 100 100  CO2  26 25 24 24 24   GLUCOSE 171* 204* 183* 98 185*  BUN 36* 42* 46* 53* 52*  CREATININE 1.76* 1.63* 1.71* 1.60* 1.92*  CALCIUM 8.7* 8.6* 8.4* 8.6* 8.2*  MG  --   --   --   --  2.1   GFR: Estimated Creatinine Clearance: 42.9 mL/min (A) (by C-G formula based on SCr of 1.92 mg/dL (H)). Liver Function Tests: No results for input(s): AST, ALT, ALKPHOS, BILITOT, PROT, ALBUMIN in the last 168 hours. No results for input(s): LIPASE, AMYLASE in the last 168 hours. No results for input(s): AMMONIA in the last 168 hours. Coagulation Profile: No results for input(s): INR, PROTIME in the last 168 hours. Cardiac Enzymes: No results for input(s): CKTOTAL, CKMB, CKMBINDEX, TROPONINI in the last 168 hours. BNP (last 3 results) No results for input(s): PROBNP in the last 8760 hours. HbA1C: No results for input(s): HGBA1C in the last 72 hours. CBG:  Recent Labs  Lab 06/29/20 1203 06/29/20 1738 06/29/20 2111 06/30/20 0616 06/30/20 0645  GLUCAP 170* 193* 130* 56* 105*   Lipid Profile: No results for input(s): CHOL, HDL, LDLCALC, TRIG, CHOLHDL, LDLDIRECT in the last 72 hours. Thyroid Function Tests: No results for input(s): TSH, T4TOTAL, FREET4, T3FREE, THYROIDAB in the last 72 hours. Anemia Panel: No results for input(s): VITAMINB12, FOLATE, FERRITIN, TIBC, IRON, RETICCTPCT in the last 72 hours. Urine analysis:    Component Value Date/Time   COLORURINE YELLOW 06/09/2020 2030   [place] 06/04/2020 2030   [place] 1.014 06/23/2020 2030   [place] 5.0 06/10/2020 2030   [place] 07/02/2020 2030   [place] 06/28/2020 2030   [place] 06/14/2020 2030   [place] 06/05/2020 2030   PROTEINUR 100 (A) 06/08/2020 2030   UROBILINOGEN 0.2 04/02/2015 1341   NITRITE NEGATIVE 07/02/2020 2030   LEUKOCYTESUR NEGATIVE 06/29/2020 2030   Sepsis Labs: @LABRCNTIP (procalcitonin:4,lacticidven:4)  ) Recent Results (from the past 240 hour(s))  Respiratory Panel by RT  PCR (Flu A&B, Covid) - Nasopharyngeal Swab     Status: None   Collection Time: 06/29/20 12:52 PM   Specimen: Nasopharyngeal Swab  Result Value Ref Range Status   SARS Coronavirus 2 by RT PCR NEGATIVE NEGATIVE Final    Comment: (NOTE) SARS-CoV-2 target nucleic acids are NOT DETECTED.  The SARS-CoV-2 RNA is generally detectable in upper respiratoy specimens during the acute phase of infection. The lowest concentration of SARS-CoV-2 viral copies this assay can detect is 131 copies/mL. A negative result does not preclude SARS-Cov-2 infection and should not be used as the sole basis for treatment or other patient management decisions. A negative result may occur with  improper specimen collection/handling, submission of specimen other than nasopharyngeal swab, presence of viral mutation(s) within the areas targeted by this assay, and inadequate number of viral copies (<131 copies/mL). A negative result must be combined with clinical observations, patient history, and epidemiological information. The expected result is Negative.  Fact Sheet for Patients:  PinkCheek.be  Fact Sheet for Healthcare Providers:  GravelBags.it  This test is no t yet approved or cleared by the [place] FDA and  has been authorized for detection and/or diagnosis of SARS-CoV-2 by FDA under an Emergency Use Authorization (EUA). This EUA will remain  in effect (meaning this test can be used) for the duration of the COVID-19 declaration under Section 564(b)(1) of the Act, 21 U.S.C. section 360bbb-3(b)(1), unless the authorization is terminated or revoked sooner.     Influenza A by PCR NEGATIVE NEGATIVE Final   Influenza B by PCR NEGATIVE NEGATIVE Final    Comment: (NOTE) The Xpert Xpress SARS-CoV-2/FLU/RSV assay is intended as an aid in  the diagnosis of influenza from Nasopharyngeal swab specimens and  should not be used as a sole basis for  treatment. Nasal washings and  aspirates are unacceptable for Xpert Xpress SARS-CoV-2/FLU/RSV  testing.  Fact Sheet for Patients: PinkCheek.be  Fact Sheet for Healthcare Providers: GravelBags.it  This test is not yet approved or cleared by the [place] FDA and  has been authorized for detection and/or diagnosis of SARS-CoV-2 by  FDA under an Emergency Use Authorization (EUA). This EUA will remain  in effect (meaning this test can be used) for the duration of the  Covid-19 declaration under Section 564(b)(1) of the Act, 21  U.S.C. section 360bbb-3(b)(1), unless the authorization is  terminated or revoked. Performed at [facility], [place] [address], [place], [place] [address]  Radiology Studies: CT HEAD WO CONTRAST  Result Date: 06/30/2020 CLINICAL DATA:  Headache, new or worsening. EXAM: CT HEAD WITHOUT CONTRAST TECHNIQUE: Contiguous axial images were obtained from the base of the skull through the vertex without intravenous contrast. COMPARISON:  Prior head CT 02/01/2017. FINDINGS: Brain: Moderate generalized cerebral atrophy. Redemonstrated chronic cortically based infarct within the anterolateral left frontal lobe. Redemonstrated chronic lacunar infarcts within the left basal ganglia and anterior limb of the left internal capsule (series 3, image 17). A chronic lacunar infarct within the genu of the right internal capsule is new as compared to the prior head CT of 02/01/2017. Background mild ill-defined hypoattenuation within the cerebral white matter which is nonspecific, but compatible with chronic small vessel ischemic disease. There is no acute intracranial hemorrhage. No acute demarcated cortical infarct is identified. No extra-axial fluid collection. No evidence of intracranial mass. No midline shift. Vascular: No hyperdense vessel.  Atherosclerotic calcifications Skull: Normal. Negative for fracture or focal  lesion. Sinuses/Orbits: Visualized orbits show no acute finding. Mild ethmoid sinus mucosal thickening. Left mastoid effusion IMPRESSION: No evidence of acute intracranial abnormality. Redemonstrated chronic left frontal lobe cortically based infarct. Redemonstrated chronic lacunar infarcts within the left basal ganglia and anterior limb of left internal capsule. A chronic lacunar infarct within the genu of the right internal capsule is new as compared to the prior head CT of 02/01/2017. Stable background moderate cerebral atrophy and mild chronic small vessel ischemic disease. Mild ethmoid sinus mucosal thickening. Left mastoid effusion. Electronically Signed   By: Kellie Simmering DO   On: 06/30/2020 11:50     Scheduled Meds: . apixaban  5 mg Oral BID  . carvedilol  3.125 mg Oral BID WC  . cholestyramine  4 g Oral Q1400  . clopidogrel  75 mg Oral Daily  . dapagliflozin propanediol  10 mg Oral Daily  . diclofenac Sodium  2 g Topical QID  . FLUoxetine  40 mg Oral QHS  . Gerhardt's butt cream   Topical TID  . hydrALAZINE  12.5 mg Oral Q8H  . insulin aspart  0-15 Units Subcutaneous TID WC  . insulin aspart  0-5 Units Subcutaneous QHS  . insulin aspart  10 Units Subcutaneous TID WC  . insulin regular human CONCENTRATED  50 Units Subcutaneous BID WC  . isosorbide mononitrate  60 mg Oral Daily  . rosuvastatin  20 mg Oral QHS  . sodium chloride flush  3 mL Intravenous Q12H  . torsemide  40 mg Oral Daily   Continuous Infusions: . sodium chloride       LOS: 11 days   Time Spent in minutes   45 minutes  Launa Goedken D.O. on 06/30/2020 at 12:16 PM  Between 7am to 7pm - Please see pager noted on [email or web address]  After 7pm go to [email or web address]  And look for the night coverage person covering for me after hours  [facility] Office  [phone]

## 2020-06-30 NOTE — Progress Notes (Signed)
Occupational Therapy Treatment Patient Details Name: Vernon Briggs MRN: [id number] DOB: 23-Sep-1945 Today's Date: 06/30/2020    History of present illness 74 year old man 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 presented with worsening shortness of breath and swelling.  Admitted for acute on chronic systolic CHF   OT comments  Patient declining all out of bed activities.  Patient limited by pain to his right elbow this time.  He has diffuse complaints of pain on a daily basis.  Patient with increased agitation this date, and yelling out.  Nursing in the room to administer pain meds and apply arthritis med to elbow.  Patient with limited participation.  OT will continue to follow, but it may be appropriate to decrease his frequency in the near term.  SNF and 24 hour assist.    Follow Up Recommendations  SNF;Supervision/Assistance - 24 hour    Equipment Recommendations  Wheelchair (measurements OT);Wheelchair cushion (measurements OT);3 in 1 bedside commode    Recommendations for Other Services      Precautions / Restrictions Precautions Precautions: Fall Restrictions Weight Bearing Restrictions: No       Mobility Bed Mobility Overal bed mobility: Needs Assistance Bed Mobility: Rolling Rolling: Mod assist                                   Vision       Perception     Praxis      Cognition Arousal/Alertness: Awake/alert Behavior During Therapy: Agitated;Restless;Anxious                           Following Commands: Follows one step commands inconsistently;Follows one step commands with increased time Safety/Judgement: Decreased awareness of safety;Decreased awareness of deficits              Exercises     Shoulder Instructions       General Comments      Pertinent Vitals/ Pain       Pain Assessment: Faces Pain Location: R elbow Pain  Descriptors / Indicators: Guarding;Grimacing Pain Intervention(s): Patient requesting pain meds-RN notified;Repositioned                                                          Frequency  Min 2X/week        Progress Toward Goals  OT Goals(current goals can now be found in the care plan section)        Plan      Co-evaluation                 AM-PAC OT "6 Clicks" Daily Activity     Outcome Measure   Help from another person eating meals?: A Lot Help from another person taking care of personal grooming?: A Lot Help from another person toileting, which includes using toliet, bedpan, or urinal?: A Lot Help from another person bathing (including washing, rinsing, drying)?: A Lot Help from another person to put on and taking off regular upper body clothing?: A Lot Help from another person to put on and taking off regular lower body clothing?: A Lot 6 Click Score: 12    End  of Session    OT Visit Diagnosis: Unsteadiness on feet (R26.81);Muscle weakness (generalized) (M62.81);Pain;History of falling (Z91.81);Hemiplegia and hemiparesis Pain - Right/Left: Right Pain - part of body: Arm   Activity Tolerance Patient limited by pain   Patient Left with call bell/phone within reach;in bed;Other (comment)   Nurse Communication Patient requests pain meds        Time: 1610-9604 OT Time Calculation (min): 15 min  Charges: OT General Charges $OT Visit: 1 Visit OT Treatments $Therapeutic Activity: 8-22 mins  06/30/2020  Rich, OTR/L  Acute Rehabilitation Services  Office:  [phone]    Metta Clines 06/30/2020, 8:59 AM

## 2020-06-30 NOTE — Progress Notes (Signed)
Patient complaining of headache, PRN tylenol given. Pt complaining of left arm weakness. Pt was able to move right arm slightly and put pressure against RNs hands when his arm was lifted. Pt is able to move other extremities but complains of pain when touched and refused further questions and neuro checks.   Pts CBG was 55 and he was given orange juice. Will recheck in 15 minutes.

## 2020-06-30 NOTE — Progress Notes (Signed)
Patient complaining of right arm pain this morning at the elbow. Denies pain in the neck and shoulders today, only the elbow. Refused to get up out of bed and work with therapy, refused to eat breakfast because of the pain. There have been no events such as falls or bumping of the elbow that have been reported. Patient does state that he had previously broken his elbow but denies this is arthritic pain. When attempting to touch the arm, patient pulls away, even though not touching in the elbow space. Additionally, patient able to hold up arm for short periods, able to grip.

## 2020-07-01 ENCOUNTER — Inpatient Hospital Stay (HOSPITAL_COMMUNITY): Payer: No Typology Code available for payment source

## 2020-07-01 LAB — MAGNESIUM: Magnesium: 1.9 mg/dL (ref 1.7–2.4)

## 2020-07-01 LAB — BASIC METABOLIC PANEL
Anion gap: 15 (ref 5–15)
BUN: 73 mg/dL — ABNORMAL HIGH (ref 8–23)
CO2: 22 mmol/L (ref 22–32)
Calcium: 8.2 mg/dL — ABNORMAL LOW (ref 8.9–10.3)
Chloride: 96 mmol/L — ABNORMAL LOW (ref 98–111)
Creatinine, Ser: 2.46 mg/dL — ABNORMAL HIGH (ref 0.61–1.24)
GFR, Estimated: 26 mL/min — ABNORMAL LOW (ref >60)
Glucose, Bld: 127 mg/dL — ABNORMAL HIGH (ref 70–99)
Potassium: 4 mmol/L (ref 3.5–5.1)
Sodium: 133 mmol/L — ABNORMAL LOW (ref 135–145)

## 2020-07-01 LAB — GLUCOSE, CAPILLARY
Glucose-Capillary: 167 mg/dL — ABNORMAL HIGH (ref 70–99)
Glucose-Capillary: 228 mg/dL — ABNORMAL HIGH (ref 70–99)
Glucose-Capillary: 264 mg/dL — ABNORMAL HIGH (ref 70–99)
Glucose-Capillary: 253 mg/dL — ABNORMAL HIGH (ref 70–99)
Glucose-Capillary: 282 mg/dL — ABNORMAL HIGH (ref 70–99)

## 2020-07-01 LAB — BLOOD GAS, ARTERIAL
Acid-base deficit: 2.4 mmol/L — ABNORMAL HIGH (ref 0.0–2.0)
Bicarbonate: 20.7 mmol/L (ref 20.0–28.0)
FIO2: 100
O2 Saturation: 98.7 %
Patient temperature: 37
pCO2 arterial: 28.4 mmHg — ABNORMAL LOW (ref 32.0–48.0)
pH, Arterial: 7.475 — ABNORMAL HIGH (ref 7.350–7.450)
pO2, Arterial: 114 mmHg — ABNORMAL HIGH (ref 83.0–108.0)

## 2020-07-01 MED ORDER — INSULIN ASPART 100 UNIT/ML ~~LOC~~ SOLN
0.0000 [IU] | SUBCUTANEOUS | Status: DC
  Administered 2020-07-01 – 2020-07-02 (×5): 5 [IU] via SUBCUTANEOUS
  Administered 2020-07-02 (×2): 3 [IU] via SUBCUTANEOUS
  Administered 2020-07-02 – 2020-07-03 (×2): 5 [IU] via SUBCUTANEOUS
  Administered 2020-07-03: 7 [IU] via SUBCUTANEOUS

## 2020-07-01 MED ORDER — SODIUM CHLORIDE 0.9 % IV SOLN: INTRAVENOUS | Status: DC

## 2020-07-01 MED ORDER — INSULIN DETEMIR 100 UNIT/ML ~~LOC~~ SOLN
11.0000 [IU] | Freq: Two times a day (BID) | SUBCUTANEOUS | Status: DC
  Administered 2020-07-01 – 2020-07-02 (×2): 11 [IU] via SUBCUTANEOUS
  Filled 2020-07-01 (×4): qty 0.11

## 2020-07-01 MED ORDER — BUTALBITAL-APAP-CAFFEINE 50-325-40 MG PO TABS: 1.0000 | ORAL_TABLET | Freq: Four times a day (QID) | ORAL | Status: DC | PRN

## 2020-07-01 NOTE — Progress Notes (Signed)
Patient's spouse at bedside.  Nurse spoke with pt's spouse regarding plan of care.

## 2020-07-01 NOTE — Progress Notes (Signed)
While changing patients linens and cleaning patient, he suddenly stopped breathing about 10 seconds. Noted apneic breaths. Placed on nonrebreather. Notified MD. Orders for ABG and chest xray put in.  BP (!) 115/50 (BP Location: Left Arm)   Pulse 75   Temp 97.8 F (36.6 C) (Oral)   Resp (!) 40   Wt 115.1 kg   SpO2 100%   BMI 34.41 kg/m

## 2020-07-01 NOTE — Progress Notes (Signed)
   07/01/20 1500  Urine Characteristics  Bladder Scan Volume (mL) 15 mL  Hygiene Peri care;Bath     07/01/20 1745  Urine Characteristics  Bladder Scan Volume (mL) 0 mL   Condom cath in place. No output at this time.

## 2020-07-01 NOTE — Progress Notes (Signed)
   07/01/20 1330  Mobility  Activity Contraindicated/medical hold (Per RN pt is not appropriate for mobility at this time )

## 2020-07-01 NOTE — Progress Notes (Signed)
Inpatient Diabetes Program Recommendations  AACE/ADA: New Consensus Statement on Inpatient Glycemic Control (2015)  Target Ranges:  Prepandial:   less than 140 mg/dL      Peak postprandial:   less than 180 mg/dL (1-2 hours)      Critically ill patients:  140 - 180 mg/dL   Lab Results  Component Value Date   GLUCAP 228 (H) 07/01/2020   HGBA1C 7.0 (H) 05/06/2020    Review of Glycemic Control Results for Vernon Briggs, Vernon Briggs (MRN [id number]) as of 07/01/2020 11:34  Ref. Range 06/30/2020 17:29 06/30/2020 21:16 07/01/2020 06:34 07/01/2020 12:01  Glucose-Capillary Latest Ref Range: 70 - 99 mg/dL 90 79 167 (H) 228 (H)   Diabetes history: Type 2 DM Outpatient Diabetes medications: U-500 55-110 units BID Current orders for Inpatient glycemic control: Novolog 0-15 units TID, Novolog 0-5 units QHS, Farxiga 10 mg QD, Novolog 10 units TID, U-500 50 units BID  Inpatient Diabetes Program Recommendations:    Noted hypoglycemia of 56 mg/dL on 10/27, renal status and poor oral intake. Usually, meal coverage not ordered in addition with U-500.   At this time, consider: -Novolog 0-15 units Q4H -Discontinuing Novolog 10 units TID -May want to consider changing U-500 to Levemir BID based on weight based dosage.  Secure chat sent to Md.   Thanks, Bronson Curb, MSN, RNC-OB Diabetes Coordinator [phone] (8a-5p)

## 2020-07-01 NOTE — Social Work (Signed)
CSW spoke with wife of pt, will take pt home. Working on the home to make it more comfortable for pt. MD made aware and will dc when pt medically stable.

## 2020-07-01 NOTE — Progress Notes (Signed)
Heart Failure Stewardship Pharmacist Progress Note   PCP: [name], MD PCP-Cardiologist: Fransico Him, MD    HPI:  74 yo M with PMH of CHF, CAD, type 2 diabetes, afib, and prior stroke. He presented to the ED on 07/02/2020 with acute on chronic CHF. ECHO done on 06/20/20 and LVEF is 25-30% (stable from prior ECHO in March 2021).  Current HF Medications: Carvedilol 3.125 mg BID Farxiga 10 mg daily Imdur 60 mg daily Hydralazine 12.5 mg q8h  Prior to admission HF Medications: Torsemide 20 mg daily Carvedilol 3.125 mg BID Imdur 30 mg daily  Pertinent Lab Values: . Serum creatinine 2.46, BUN 52, Potassium 3.5, Sodium 136, BNP 330.8  Vital Signs: . Weight: 253 lbs (admission weight: 281 lbs) . Blood pressure: 110-150/70s  . Heart rate: 70-80s  Assessment: 1. Acute on chronic systolic CHF (EF 84-53%), due to ischemic cardiomyopathy. NYHA class II symptoms. SNF denied by [facility]; discharge now home with HH. - Holding torsemide 40 mg daily due to increase in SCr - Continue carvedilol 3.125 mg BID - No ACE/ARB/ARNI or spironolactone (was previously on lisinopril and spironolactone but these were stopped due to AKI last admission). - Continue Farxiga 10 mg daily - Continue Imdur 60 mg daily - Continue hydralazine 12.5 mg q8h  Plan: 1) Medication changes recommended at this time: - Agree with holding torsemide as above. May need to consider additional decreases of BP lowering agents pending response to holding torsemide and giving NS @ 50 mL/hr.  Kerby Nora, PharmD, BCPS Heart Failure Stewardship Pharmacist Phone [phone]

## 2020-07-01 NOTE — Progress Notes (Signed)
Nurse attempted to give medications to pt this am, pt had trouble swallowing his water. Medications were not given.   Patient is not eating breakfast, Suction is set-up at bedside.    Pt was anxious this am.  Ativan prn iv given.   Pt's spouse at bedside requesting to speak with nurse. Nurse answered pt's spouse questions.   Call bell within reach. Bed alarm on.

## 2020-07-01 NOTE — Evaluation (Signed)
Clinical/Bedside Swallow Evaluation Patient Details  Name: Vernon Briggs MRN: [id number] Date of Birth: 09/01/1946  Today's Date: 07/01/2020 Time: SLP Start Time (ACUTE ONLY): 6440 SLP Stop Time (ACUTE ONLY): 1505 SLP Time Calculation (min) (ACUTE ONLY): 15 min  Past Medical History:  Past Medical History:  Diagnosis Date   AICD (automatic cardioverter/defibrillator) present    Anxiety    Arthritis    "occasionally in my right hand" (04/02/2015)   CAD in native artery 1999   Cancer [facility])    CHF (congestive heart failure) ([place])    Depression    GERD (gastroesophageal reflux disease)    "occasionally" (04/02/2015)   History of hiatal hernia    Hypercholesterolemia    Hypertension    Myocardial infarction ([place]) "several"   OSA on CPAP    PAF (paroxysmal atrial fibrillation) ([place])    PTSD (post-traumatic stress disorder)    Stroke ([place])    "I've had 2; my right leg & arm slightly weaker since" (04/02/2015)   Type II diabetes mellitus ([place])    Past Surgical History:  Past Surgical History:  Procedure Laterality Date   BIOPSY  05/09/2020   Procedure: BIOPSY;  Surgeon: Ladene Artist, MD;  Location: [place];  Service: Endoscopy;;   CARDIAC DEFIBRILLATOR PLACEMENT     [place]   COLONOSCOPY WITH PROPOFOL N/A 05/09/2020   Procedure: COLONOSCOPY WITH PROPOFOL;  Surgeon: Ladene Artist, MD;  Location: [facility] ENDOSCOPY;  Service: Endoscopy;  Laterality: N/A;   CORONARY ANGIOPLASTY WITH STENT PLACEMENT     "I've got 7 stents total" (04/02/2015)   CORONARY ARTERY BYPASS GRAFT  1999   CABG X4; LIMA-LAD, SVG-OM1, SVG-RI, SVG-dRCA   CORONARY STENT INTERVENTION N/A 11/24/2019   Procedure: CORONARY STENT INTERVENTION;  Surgeon: [name], MD;  Location: [place] CV LAB;  Service: Cardiovascular;  Laterality: N/A;   INTRAVASCULAR ULTRASOUND/IVUS N/A 11/24/2019   Procedure: Intravascular Ultrasound/IVUS;  Surgeon: [name], MD;  Location: [place]  CV LAB;  Service: Cardiovascular;  Laterality: N/A;   LEFT HEART CATH N/A 11/24/2019   Procedure: Left Heart Cath;  Surgeon: [name], MD;  Location: [place] CV LAB;  Service: Cardiovascular;  Laterality: N/A;   LEFT HEART CATH AND CORS/GRAFTS ANGIOGRAPHY N/A 11/21/2019   Procedure: LEFT HEART CATH AND CORS/GRAFTS ANGIOGRAPHY;  Surgeon: Burnell Blanks, MD;  Location: [place] CV LAB;  Service: Cardiovascular;  Laterality: N/A;   LEFT HEART CATHETERIZATION WITH CORONARY/GRAFT ANGIOGRAM N/A 09/14/2014   Procedure: LEFT HEART CATHETERIZATION WITH Beatrix Fetters;  Surgeon: Leonie Man, MD;  Location: [facility] CATH LAB;  Service: Cardiovascular;  Laterality: N/A;   PERCUTANEOUS CORONARY STENT INTERVENTION (PCI-S) Left 09/14/2014   Procedure: PERCUTANEOUS CORONARY STENT INTERVENTION (PCI-S);  Surgeon: Leonie Man, MD;  Location: [facility] CATH LAB;  Service: Cardiovascular;  Laterality: Left;   POLYPECTOMY  05/09/2020   Procedure: POLYPECTOMY;  Surgeon: Ladene Artist, MD;  Location: [facility] ENDOSCOPY;  Service: Endoscopy;;   HPI:  74 y.o. male with medical history significant of DM2, H/O stroke, PAF, OSA, HTN, CAD with chronic HFrEF, GERD, anxiety, HLD, HTN who presented with worsening SOB and swelling.  Per patient and his wife, Vernon Briggs was recently in the hospital in September for renal failure and hyperkalemia.  He had some mild SOB at that time. Due to the issues with his kidneys, he had his lisinopril, spironolactone stopped and his torsemide halved to 20mg  daily.  His wife monitors his medications and can attest to these changes.  Since that  time he has been weaker at home and has been slowly getting worse over the last week and a half. Patient admitted with respiratory failure, CHF exacerbation.  Now complaining of headache, CT head obtained and unremarkable for acute findings.  Overnight, patient became, patient placed on nonrebreather and on supplemental oxygen.  ABG  unremarkable, chest x-ray obtained with no acute infection.     Assessment / Plan / Recommendation Clinical Impression  Clinical swallow evaluation requested as nurse reports overt coughing with thin liquids this am. Pt very lethargic this afternoon despite extensive oral care (xerostomia noted). Nurse did report recent Ativan administered via alternative means. Single ice chip extracted after oral holding and no palpable swallow. Recommend NPO with medicines via alternative means. SLP to closely monitor for PO readiness.   SLP Visit Diagnosis: Dysphagia, unspecified (R13.10)    Aspiration Risk  Severe aspiration risk;Risk for inadequate nutrition/hydration    Diet Recommendation   NPO  Medication Administration: Via alternative means    Other  Recommendations Oral Care Recommendations: Oral care QID   Follow up Recommendations        Frequency and Duration min 2x/week  1 week       Prognosis Prognosis for Safe Diet Advancement: Fair Barriers to Reach Goals: Time post onset;Severity of deficits      Swallow Study   General Date of Onset: 07/04/2020 HPI: 74 y.o. male with medical history significant of DM2, H/O stroke, PAF, OSA, HTN, CAD with chronic HFrEF, GERD, anxiety, HLD, HTN who presented with worsening SOB and swelling.  Per patient and his wife, Vernon Briggs was recently in the hospital in September for renal failure and hyperkalemia.  He had some mild SOB at that time. Due to the issues with his kidneys, he had his lisinopril, spironolactone stopped and his torsemide halved to 20mg  daily.  His wife monitors his medications and can attest to these changes.  Since that time he has been weaker at home and has been slowly getting worse over the last week and a half. Patient admitted with respiratory failure, CHF exacerbation.  Now complaining of headache, CT head obtained and unremarkable for acute findings.  Overnight, patient became, patient placed on nonrebreather and on  supplemental oxygen.  ABG unremarkable, chest x-ray obtained with no acute infection.   Type of Study: Bedside Swallow Evaluation Previous Swallow Assessment: none on file Diet Prior to this Study: Regular;Thin liquids Temperature Spikes Noted: No Respiratory Status: Nasal cannula History of Recent Intubation: No Behavior/Cognition: Lethargic/Drowsy;Doesn't follow directions Oral Cavity Assessment: Dry Oral Care Completed by SLP: Yes Oral Cavity - Dentition: Adequate natural dentition Self-Feeding Abilities: Total assist Patient Positioning: Upright in bed Baseline Vocal Quality: Low vocal intensity;Not observed Volitional Cough: Cognitively unable to elicit Volitional Swallow: Unable to elicit    Oral/Motor/Sensory Function Overall Oral Motor/Sensory Function: Generalized oral weakness   Ice Chips Ice chips: Impaired Presentation: Spoon Oral Phase Impairments: Poor awareness of bolus Oral Phase Functional Implications: Oral holding Pharyngeal Phase Impairments: Unable to trigger swallow   Thin Liquid Thin Liquid: Not tested    Nectar Thick Nectar Thick Liquid: Not tested   Honey Thick Honey Thick Liquid: Not tested   Puree Puree: Not tested   Solid     Solid: Not tested      Rashed Edler E Sevon Rotert MA, CCC-SLP Acute Rehabilitation Services  07/01/2020,3:28 PM

## 2020-07-01 NOTE — Progress Notes (Signed)
Pt did not eat lunch. Pt is unable to drink water at this time. Suction set-up at bedside. Spouse at bedside.

## 2020-07-01 NOTE — Progress Notes (Signed)
PROGRESS NOTE    [name]  WCH:[id number] DOB: 08/07/1946 DOA: 06/17/2020 PCP: [name], MD   Brief Narrative:  HPI on 06/14/2020 by Dr. Gilles Chiquito Vernon Briggs is a 74 y.o. male with medical history significant of DM2, H/O stroke, PAF, OSA, HTN, CAD with chronic HFrEF, GERD, anxiety, HLD, HTN who presented with worsening SOB and swelling.  Per patient and his wife, Vernon Briggs was recently in the hospital in September for renal failure and hyperkalemia.  He had some mild SOB at that time. Due to the issues with his kidneys, he had his lisinopril, spironolactone stopped and his torsemide halved to 20mg  daily.  His wife monitors his medications and can attest to these changes.  Since that time he has been weaker at home and has been slowly getting worse over the last week and a half.  Specifically he has been having worsening SOB, worsening lower extremity edema, decreased PO intake, worsening weakness and increasing blood pressure.  She takes his blood pressure daily and noted an increase to the 160s/70s at home.  He further has been having worsening orthopnea and increased PND.  He tried to use his home CPAP today to help and this did not improve his symptoms so they called EMS.  He was placed on oxygen and feels better.  He further has had a mild cough in the hospital and wheezing.  He denies chest pain, fever, chills, nausea, change in vision, headache, abdominal pain, recent falls.  He has had generalized weakness, chronic diarrhea (unchanged) and swelling.    Interim history Patient admitted with respiratory failure, CHF exacerbation.  Now complaining of headache, CT head obtained and unremarkable for acute findings.  Overnight, patient became, patient placed on nonrebreather and on supplemental oxygen.  ABG unremarkable, chest x-ray obtained with no acute infection. Assessment & Plan   Acute respiratory failure with hypoxia secondary acute on chronic systolic heart failure  exacerbation -Thought to be secondary to noncompliance with medications -Patient required oxygen upon admission -He had presented with generalized weakness as well as swelling and shortness of breath -Patient was placed on IV Lasix, 80 mg -Cardiology consulted and appreciated -He has been transitioned to oral torsemide-which has been held due to AKI -Patient was weaned to room air however overnight became dyspneic and somewhat apneic.  He was placed on nonrebreather.  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, stage IIIb -Suspect secondary to poor oral intake along with medications -Torsemide currently held -Will place on gentle IV fluids and monitor BMP  Paroxysmal atrial fibrillation -Continue Eliquis, Coreg  Essential hypertension -Stable -continue Coreg, hydralazine, Demadex, Imdur  Obstructive sleep apnea -Continue CPAP- however he has declined to use it -was apneic overnight, and placed on NRB, now on 4L supplemental O2  Fever -Patient had fever approximately 1 week ago -UA and CXR were unremarkable for infection -Thought to be secondary to thrombophlebitis and chronic diarrhea -Patient has been afebrile for more than 72 hours  Headache -Per wife, patient has had a headache for 3 days -Patient tells me his headache starts in the back of his head.  Denies any previous head injury, nausea or vomiting, sensitivity. -Suspect this is more tension type headache -Given his history of CVA, CT  head obtained and was unremarkable for acute findings -Have ordered Fioricet along with K pad -Continue to monitor closely  Physical deconditioning -PT and OT recommending SNF -Patient has been unable  to work with PT and OT given his pain and headache -Hopefully as his headache improves, he will be able to work more with therapy -SNF was declined by [place] [facility] -planning for patient to be discharged to home with home health services  Goals of care -Patient currently DNR however continues to have minimal improvement -Currently not eating or drinking much -Have asked for nursing to document amount of intake -Have consulted palliative care for further goals of care discussion -Feel patient may benefit from possible discussion of hospice  Poor oral intake -Will consult nutrition -As above, have asked for nursing to document patient's oral intake  Prolonged QT -EKG obtained overnight as patient was not feeling well and stated he had pain -Remarkable for ACS finding -However does show a prolonged QT -Potassium 4, will obtain magnesium level  DVT Prophylaxis Eliquis  Code Status: DNR  Family Communication: Wife via phone  Disposition Plan:  Status is: Inpatient  Remains inpatient appropriate because:Ongoing diagnostic testing needed not appropriate for outpatient work up and Unsafe d/c plan   Dispo: The patient is from: Home              Anticipated d/c is to: SNF vs Home w/ [facility]              Anticipated d/c date is: 2 days              Patient currently is not medically stable to d/c.   Consultants Cardiology  Procedures  Echocardiogram  Antibiotics   Anti-infectives (From admission, onward)   None      Subjective:   Vernon Briggs seen and examined today.  Patient only groaning this morning and not very conversant.  Wants to be left alone.    Objective:   Vitals:   07/01/20 0617 07/01/20 0634 07/01/20 0736 07/01/20 0948  BP: (!) 112/49 (!) 158/67 (!) 115/50   Pulse: 72 85 75   Resp: 20 (!) 40    Temp: 97.8 F (36.6 C)     TempSrc: Oral     SpO2: 91% 100% 100% 94%  Weight:        Intake/Output Summary (Last 24 hours) at 07/01/2020 1227 Last data  filed at 07/01/2020 0620 Gross per 24 hour  Intake 123 ml  Output 300 ml  Net -177 ml   Filed Weights   06/27/20 0614 06/28/20 0500 06/29/20 0529  Weight: 121.1 kg 118.3 kg 115.1 kg   Exam  General: Well developed, ill-appearing, NAD  HEENT: NCAT,  mucous membranes moist.   Cardiovascular: S1 S2 auscultated, irregular  Respiratory: Diminished breath sounds  Abdomen: Soft, obese, nontender, nondistended, + bowel sounds  Extremities: warm dry without cyanosis clubbing  Data Reviewed: I have personally reviewed following labs and imaging studies  CBC: Recent Labs  Lab 06/24/20 1350 06/26/20 1047 06/27/20 0452 06/28/20 1102  WBC 3.2* 5.0 6.0 10.8*  NEUTROABS 2.7 3.3 4.3 9.9*  HGB 12.2* 11.9* 12.3* 11.8*  HCT 35.9* 34.8* 35.8* 34.7*  MCV 96.0 93.8 93.0 93.8  PLT 79* 80* 82* 505*   Basic Metabolic Panel: Recent Labs  Lab 06/25/20 0957 06/26/20 0444 06/27/20 0452 06/28/20 1102 07/01/20 0257  NA 136 134* 137 136 133*  K 3.4* 3.3* 3.6 3.5 4.0  CL 98 99 100 100 96*  CO2 25  24 24 24 22   GLUCOSE 204* 183* 98 185* 127*  BUN 42* 46* 53* 52* 73*  CREATININE 1.63* 1.71* 1.60* 1.92* 2.46*  CALCIUM 8.6* 8.4* 8.6* 8.2* 8.2*  MG  --   --   --  2.1  --    GFR: Estimated Creatinine Clearance: 33.5 mL/min (A) (by C-G formula based on SCr of 2.46 mg/dL (H)). Liver Function Tests: No results for input(s): AST, ALT, ALKPHOS, BILITOT, PROT, ALBUMIN in the last 168 hours. No results for input(s): LIPASE, AMYLASE in the last 168 hours. No results for input(s): AMMONIA in the last 168 hours. Coagulation Profile: No results for input(s): INR, PROTIME in the last 168 hours. Cardiac Enzymes: No results for input(s): CKTOTAL, CKMB, CKMBINDEX, TROPONINI in the last 168 hours. BNP (last 3 results) No results for input(s): PROBNP in the last 8760 hours. HbA1C: No results for input(s): HGBA1C in the last 72 hours. CBG: Recent Labs  Lab 06/30/20 0645 06/30/20 1729 06/30/20 2116  07/01/20 0634 07/01/20 1201  GLUCAP 105* 90 79 167* 228*   Lipid Profile: No results for input(s): CHOL, HDL, LDLCALC, TRIG, CHOLHDL, LDLDIRECT in the last 72 hours. Thyroid Function Tests: No results for input(s): TSH, T4TOTAL, FREET4, T3FREE, THYROIDAB in the last 72 hours. Anemia Panel: No results for input(s): VITAMINB12, FOLATE, FERRITIN, TIBC, IRON, RETICCTPCT in the last 72 hours. Urine analysis:    Component Value Date/Time   COLORURINE YELLOW 06/14/2020 2030   [place] 07/04/2020 2030   [place] 1.014 06/30/2020 2030   [place] 5.0 06/18/2020 2030   [place] 07/01/2020 2030   [place] 06/18/2020 2030   [place] 06/18/2020 2030   [place] 06/04/2020 2030   PROTEINUR 100 (A) 06/24/2020 2030   UROBILINOGEN 0.2 04/02/2015 1341   NITRITE NEGATIVE 06/30/2020 2030   LEUKOCYTESUR NEGATIVE 06/15/2020 2030   Sepsis Labs: @LABRCNTIP (procalcitonin:4,lacticidven:4)  ) Recent Results (from the past 240 hour(s))  Respiratory Panel by RT PCR (Flu A&B, Covid) - Nasopharyngeal Swab     Status: None   Collection Time: 06/29/20 12:52 PM   Specimen: Nasopharyngeal Swab  Result Value Ref Range Status   SARS Coronavirus 2 by RT PCR NEGATIVE NEGATIVE Final    Comment: (NOTE) SARS-CoV-2 target nucleic acids are NOT DETECTED.  The SARS-CoV-2 RNA is generally detectable in upper respiratoy specimens during the acute phase of infection. The lowest concentration of SARS-CoV-2 viral copies this assay can detect is 131 copies/mL. A negative result does not preclude SARS-Cov-2 infection and should not be used as the sole basis for treatment or other patient management decisions. A negative result may occur with  improper specimen collection/handling, submission of specimen other than nasopharyngeal swab, presence of viral mutation(s) within the areas targeted by this assay, and inadequate number of viral copies (<131 copies/mL). A negative result  must be combined with clinical observations, patient history, and epidemiological information. The expected result is Negative.  Fact Sheet for Patients:  PinkCheek.be  Fact Sheet for Healthcare Providers:  GravelBags.it  This test is no t yet approved or cleared by the [place] FDA and  has been authorized for detection and/or diagnosis of SARS-CoV-2 by FDA under an Emergency Use Authorization (EUA). This EUA will remain  in effect (meaning this test can be used) for the duration of the COVID-19 declaration under Section 564(b)(1) of the Act, 21 U.S.C. section 360bbb-3(b)(1), unless the authorization is terminated or revoked sooner.     Influenza A by PCR NEGATIVE NEGATIVE Final   Influenza  B by PCR NEGATIVE NEGATIVE Final    Comment: (NOTE) The Xpert Xpress SARS-CoV-2/FLU/RSV assay is intended as an aid in  the diagnosis of influenza from Nasopharyngeal swab specimens and  should not be used as a sole basis for treatment. Nasal washings and  aspirates are unacceptable for Xpert Xpress SARS-CoV-2/FLU/RSV  testing.  Fact Sheet for Patients: PinkCheek.be  Fact Sheet for Healthcare Providers: GravelBags.it  This test is not yet approved or cleared by the [place] FDA and  has been authorized for detection and/or diagnosis of SARS-CoV-2 by  FDA under an Emergency Use Authorization (EUA). This EUA will remain  in effect (meaning this test can be used) for the duration of the  Covid-19 declaration under Section 564(b)(1) of the Act, 21  U.S.C. section 360bbb-3(b)(1), unless the authorization is  terminated or revoked. Performed at [facility], [place] [address], [place], [place] [address]       Radiology Studies: CT HEAD WO CONTRAST  Result Date: 06/30/2020 CLINICAL DATA:  Headache, new or worsening. EXAM: CT HEAD WITHOUT CONTRAST TECHNIQUE:  Contiguous axial images were obtained from the base of the skull through the vertex without intravenous contrast. COMPARISON:  Prior head CT 02/01/2017. FINDINGS: Brain: Moderate generalized cerebral atrophy. Redemonstrated chronic cortically based infarct within the anterolateral left frontal lobe. Redemonstrated chronic lacunar infarcts within the left basal ganglia and anterior limb of the left internal capsule (series 3, image 17). A chronic lacunar infarct within the genu of the right internal capsule is new as compared to the prior head CT of 02/01/2017. Background mild ill-defined hypoattenuation within the cerebral white matter which is nonspecific, but compatible with chronic small vessel ischemic disease. There is no acute intracranial hemorrhage. No acute demarcated cortical infarct is identified. No extra-axial fluid collection. No evidence of intracranial mass. No midline shift. Vascular: No hyperdense vessel.  Atherosclerotic calcifications Skull: Normal. Negative for fracture or focal lesion. Sinuses/Orbits: Visualized orbits show no acute finding. Mild ethmoid sinus mucosal thickening. Left mastoid effusion IMPRESSION: No evidence of acute intracranial abnormality. Redemonstrated chronic left frontal lobe cortically based infarct. Redemonstrated chronic lacunar infarcts within the left basal ganglia and anterior limb of left internal capsule. A chronic lacunar infarct within the genu of the right internal capsule is new as compared to the prior head CT of 02/01/2017. Stable background moderate cerebral atrophy and mild chronic small vessel ischemic disease. Mild ethmoid sinus mucosal thickening. Left mastoid effusion. Electronically Signed   By: Kellie Simmering DO   On: 06/30/2020 11:50   DG CHEST PORT 1 VIEW  Result Date: 07/01/2020 CLINICAL DATA:  Apnea. EXAM: PORTABLE CHEST 1 VIEW COMPARISON:  06/06/2020 FINDINGS: Similar enlarged cardiac silhouette. Calcific atherosclerosis of the aorta similar  position of a dual lead left subclavian approach cardiac rhythm maintenance device. Prior CABG with median sternotomy. Low lung volumes. No visible pleural effusions or pneumothorax on this limited AP portable semi erect. IMPRESSION: Low lung volumes without evidence of acute cardiopulmonary disease. Electronically Signed   By: Margaretha Sheffield MD   On: 07/01/2020 08:08     Scheduled Meds: . apixaban  5 mg Oral BID  . carvedilol  3.125 mg Oral BID WC  . cholestyramine  4 g Oral Q1400  . clopidogrel  75 mg Oral Daily  . dapagliflozin propanediol  10 mg Oral Daily  . diclofenac Sodium  2 g Topical QID  . FLUoxetine  40 mg Oral QHS  . Gerhardt's butt cream   Topical TID  .  hydrALAZINE  12.5 mg Oral Q8H  . insulin aspart  0-15 Units Subcutaneous TID WC  . insulin aspart  0-5 Units Subcutaneous QHS  . insulin aspart  10 Units Subcutaneous TID WC  . insulin regular human CONCENTRATED  50 Units Subcutaneous BID WC  . isosorbide mononitrate  60 mg Oral Daily  . rosuvastatin  20 mg Oral QHS  . sodium chloride flush  3 mL Intravenous Q12H   Continuous Infusions: . sodium chloride    . sodium chloride 50 mL/hr at 07/01/20 0837     LOS: 12 days   Time Spent in minutes   45 minutes  Terrilyn Tyner D.O. on 07/01/2020 at 12:27 PM  Between 7am to 7pm - Please see pager noted on [email or web address]  After 7pm go to [email or web address]  And look for the night coverage person covering for me after hours  [facility] Office  [phone]

## 2020-07-01 NOTE — Progress Notes (Signed)
PT Cancellation Note  Patient Details Name: Vernon Briggs MRN: [id number] DOB: March 29, 1946   Cancelled Treatment:    Reason Eval/Treat Not Completed: Medical issues which prohibited therapy Pt confused, restless, somewhat apneic breathing at rest. Will hold therapy currently and follow up for when pt is able to participate. Discussed with DO.   Wyona Almas, PT, DPT Acute Rehabilitation Services Pager [phone] Office [phone]    Deno Etienne 07/01/2020, 2:16 PM

## 2020-07-01 NOTE — Progress Notes (Addendum)
Initial Nutrition Assessment  DOCUMENTATION CODES:   Obesity unspecified  INTERVENTION:   -RD will follow for diet advancement and add supplements as appropriate -If pt remains unable to take PO's and is within goals of care, consider initiation of temporary nutrition support (ex cortrak):   Initiate Osmolite 1.5 @ 20 ml/hr and increase by 10 ml every 8 hours to goal rate of 60 ml/hr.   45 ml Prosource TF BID    Tube feeding regimen provides 2280 kcal (100% of needs), 123 grams of protein, and 1097 ml of H2O.   -If feedings are initiated, monitor Mg, K, and Phos daily and replete as needed due to high refeeding risk  NUTRITION DIAGNOSIS:   Inadequate oral intake related to inability to eat as evidenced by NPO status.  GOAL:   Patient will meet greater than or equal to 90% of their needs  MONITOR:   Diet advancement, Labs, Weight trends, Skin, I & O's  REASON FOR ASSESSMENT:   Rounds    ASSESSMENT:   Vernon Briggs is a 74 y.o. male with medical history significant of DM2, H/O stroke, PAF, OSA, HTN, CAD with chronic HFrEF, GERD, anxiety, HLD, HTN who presented with worsening SOB and swelling.  Pt admitted with CHF.   10/28- s/p BSE- recommend NPO  Reviewed I/O's: -177 ml x 24 hours and -7.1 L since admission  UOP: 300 ml x 24 hours  Case discussed with pharmacist, who is requesting that RD evaluate pt in light of poor oral intake.   Case discussed extensively with RN, who reports that pt is very lethargic and unable to swallow. He is currently NPO. She reports that pt has been unable to consume meals or take medications secondary to mental status. RN confirmed plan for SLP evaluation and palliative care consult. RN requests that RD hold off on exam at this time, as pt wife is very anxious right now.  Reviewed wt hx; pt has experienced a 12.7% wt loss over the past 2 months, which is significant for time frame.   Palliative care consult pending to discuss goals of  care.   Lab Results  Component Value Date   HGBA1C 7.0 (H) 05/06/2020   PTA DM medications are 110 units U-500 daily and 55 units U-500 daily.   Labs reviewed: CBGS: 228 (inpatient orders for glycemic control are 10 mg dapagliflozin propanediol daily, 0-9 units insulin aspart every 4 hours, 10 units insulin aspart TID with meals, and 11 units insulin detemir BID).    Diet Order:   Diet Order            Diet NPO time specified  Diet effective now           Diet - low sodium heart healthy                 EDUCATION NEEDS:   No education needs have been identified at this time  Skin:  Skin Assessment: Skin Integrity Issues: Skin Integrity Issues:: Other (Comment) Other: rt elbow IV site with phlebitis  Last BM:  06/30/20  Height:   Ht Readings from Last 1 Encounters:  05/05/20 6' (1.829 m)    Weight:   Wt Readings from Last 1 Encounters:  06/29/20 115.1 kg    Ideal Body Weight:  80.9 kg  BMI:  Body mass index is 34.41 kg/m.  Estimated Nutritional Needs:   Kcal:  2200-2400  Protein:  120-135 grams  Fluid:  2 L    Loistine Chance, RD,  LDN, [place] Registered Dietitian II Certified Diabetes Care and Education Specialist Please refer to [facility] for RD and/or RD on-call/weekend/after hours pager

## 2020-07-02 ENCOUNTER — Inpatient Hospital Stay (HOSPITAL_COMMUNITY): Payer: No Typology Code available for payment source

## 2020-07-02 DIAGNOSIS — R410 Disorientation, unspecified: Secondary | ICD-10-CM

## 2020-07-02 DIAGNOSIS — N179 Acute kidney failure, unspecified: Secondary | ICD-10-CM

## 2020-07-02 DIAGNOSIS — Z515 Encounter for palliative care: Secondary | ICD-10-CM

## 2020-07-02 LAB — VITAMIN B12: Vitamin B-12: 434 pg/mL (ref 180–914)

## 2020-07-02 LAB — URINALYSIS, ROUTINE W REFLEX MICROSCOPIC
Bilirubin Urine: NEGATIVE
Glucose, UA: 50 mg/dL — AB
Ketones, ur: NEGATIVE mg/dL
Nitrite: NEGATIVE
Protein, ur: 100 mg/dL — AB
RBC / HPF: >50 RBC/hpf — ABNORMAL HIGH (ref 0–5)
Specific Gravity, Urine: 1.016 (ref 1.005–1.030)
WBC, UA: >50 WBC/hpf — ABNORMAL HIGH (ref 0–5)
pH: 5 (ref 5.0–8.0)

## 2020-07-02 LAB — FOLATE: Folate: 12 ng/mL (ref >5.9)

## 2020-07-02 LAB — BASIC METABOLIC PANEL
Anion gap: 17 — ABNORMAL HIGH (ref 5–15)
BUN: 111 mg/dL — ABNORMAL HIGH (ref 8–23)
CO2: 19 mmol/L — ABNORMAL LOW (ref 22–32)
Calcium: 8.1 mg/dL — ABNORMAL LOW (ref 8.9–10.3)
Chloride: 97 mmol/L — ABNORMAL LOW (ref 98–111)
Creatinine, Ser: 4.42 mg/dL — ABNORMAL HIGH (ref 0.61–1.24)
GFR, Estimated: 13 mL/min — ABNORMAL LOW (ref >60)
Glucose, Bld: 301 mg/dL — ABNORMAL HIGH (ref 70–99)
Potassium: 4.6 mmol/L (ref 3.5–5.1)
Sodium: 133 mmol/L — ABNORMAL LOW (ref 135–145)

## 2020-07-02 LAB — CBC
HCT: 36.1 % — ABNORMAL LOW (ref 39.0–52.0)
Hemoglobin: 12 g/dL — ABNORMAL LOW (ref 13.0–17.0)
MCH: 31.2 pg (ref 26.0–34.0)
MCHC: 33.2 g/dL (ref 30.0–36.0)
MCV: 93.8 fL (ref 80.0–100.0)
Platelets: 121 10*3/uL — ABNORMAL LOW (ref 150–400)
RBC: 3.85 MIL/uL — ABNORMAL LOW (ref 4.22–5.81)
RDW: 15.1 % (ref 11.5–15.5)
WBC: 16.2 10*3/uL — ABNORMAL HIGH (ref 4.0–10.5)
nRBC: 0 % (ref 0.0–0.2)

## 2020-07-02 LAB — HEPATIC FUNCTION PANEL
ALT: 20 U/L (ref 0–44)
AST: 43 U/L — ABNORMAL HIGH (ref 15–41)
Albumin: 2.2 g/dL — ABNORMAL LOW (ref 3.5–5.0)
Alkaline Phosphatase: 118 U/L (ref 38–126)
Bilirubin, Direct: 0.6 mg/dL — ABNORMAL HIGH (ref 0.0–0.2)
Indirect Bilirubin: 0.7 mg/dL (ref 0.3–0.9)
Total Bilirubin: 1.3 mg/dL — ABNORMAL HIGH (ref 0.3–1.2)
Total Protein: 5.7 g/dL — ABNORMAL LOW (ref 6.5–8.1)

## 2020-07-02 LAB — SODIUM, URINE, RANDOM: Sodium, Ur: <10 mmol/L

## 2020-07-02 LAB — GLUCOSE, CAPILLARY
Glucose-Capillary: 205 mg/dL — ABNORMAL HIGH (ref 70–99)
Glucose-Capillary: 247 mg/dL — ABNORMAL HIGH (ref 70–99)
Glucose-Capillary: 258 mg/dL — ABNORMAL HIGH (ref 70–99)
Glucose-Capillary: 281 mg/dL — ABNORMAL HIGH (ref 70–99)
Glucose-Capillary: 282 mg/dL — ABNORMAL HIGH (ref 70–99)
Glucose-Capillary: 287 mg/dL — ABNORMAL HIGH (ref 70–99)

## 2020-07-02 LAB — OSMOLALITY, URINE: Osmolality, Ur: 351 mOsm/kg (ref 300–900)

## 2020-07-02 LAB — CREATININE, URINE, RANDOM: Creatinine, Urine: 213.77 mg/dL

## 2020-07-02 LAB — TSH: TSH: 1.704 u[IU]/mL (ref 0.350–4.500)

## 2020-07-02 LAB — PHOSPHORUS: Phosphorus: 5.5 mg/dL — ABNORMAL HIGH (ref 2.5–4.6)

## 2020-07-02 LAB — MAGNESIUM: Magnesium: 2.3 mg/dL (ref 1.7–2.4)

## 2020-07-02 MED ORDER — METRONIDAZOLE IN NACL 5-0.79 MG/ML-% IV SOLN
500.0000 mg | Freq: Three times a day (TID) | INTRAVENOUS | Status: DC
  Administered 2020-07-02 – 2020-07-03 (×2): 500 mg via INTRAVENOUS
  Filled 2020-07-02 (×2): qty 100

## 2020-07-02 MED ORDER — INSULIN ASPART 100 UNIT/ML ~~LOC~~ SOLN
6.0000 [IU] | SUBCUTANEOUS | Status: DC
  Administered 2020-07-02 – 2020-07-03 (×4): 6 [IU] via SUBCUTANEOUS

## 2020-07-02 MED ORDER — SODIUM CHLORIDE 0.9 % IV SOLN
2.0000 g | INTRAVENOUS | Status: DC
  Administered 2020-07-02: 2 g via INTRAVENOUS
  Filled 2020-07-02: qty 2

## 2020-07-02 MED ORDER — VITAL HIGH PROTEIN PO LIQD: 1000.0000 mL | ORAL | Status: DC

## 2020-07-02 MED ORDER — HALOPERIDOL LACTATE 5 MG/ML IJ SOLN
1.0000 mg | Freq: Two times a day (BID) | INTRAMUSCULAR | Status: DC | PRN
  Administered 2020-07-02: 1 mg via INTRAVENOUS
  Filled 2020-07-02: qty 1

## 2020-07-02 MED ORDER — SODIUM CHLORIDE 0.9 % IV SOLN
3.0000 g | Freq: Two times a day (BID) | INTRAVENOUS | Status: DC
  Administered 2020-07-02: 3 g via INTRAVENOUS
  Filled 2020-07-02: qty 3
  Filled 2020-07-02: qty 8

## 2020-07-02 MED ORDER — PROSOURCE TF PO LIQD
45.0000 mL | Freq: Two times a day (BID) | ORAL | Status: DC
  Administered 2020-07-02: 45 mL
  Filled 2020-07-02 (×2): qty 45

## 2020-07-02 MED ORDER — OSMOLITE 1.5 CAL PO LIQD
1000.0000 mL | ORAL | Status: DC
  Administered 2020-07-02: 1000 mL
  Filled 2020-07-02 (×2): qty 1000

## 2020-07-02 MED ORDER — INSULIN DETEMIR 100 UNIT/ML ~~LOC~~ SOLN
18.0000 [IU] | Freq: Two times a day (BID) | SUBCUTANEOUS | Status: DC
  Administered 2020-07-02: 18 [IU] via SUBCUTANEOUS
  Filled 2020-07-02 (×3): qty 0.18

## 2020-07-02 NOTE — Progress Notes (Signed)
Patient alert and oriented x 1 today; knew only self but not time, place nor situation. Over the last 24-48 hours patient kidney function has markedly worsened. He is currently be rehydrated slowly with NS and awaiting a nephrology consult. Additionally, patient mobility has decreased and he is no longer trying to get out of bed unassissted, nor requesting use of BSC. Lungs are clear, HR regular at time of assessment. Patient is currently NPO due to swallowing difficulty when assessed by SLP yesterday; it was thought that the patient may have been unable to participate appropriately in the assessment due to receiving ativan. We are holding ativan for his anxiety and restlessness today until he can be reevaluated by speech; orders are to remain NPO until that time. UA collected this AM via straight cath using sterile technique and renal ultrasound scheduled for later today. Will start on Unasyn for universal coverage at this time. Spouse continues to be overwhelmed and tearful regarding patient's status and now, new decline. Palliative has been consulted but spouse is not at acceptance stage yet, despite my multiple conversations with her detailing the added support they could provide both her and patient. Spouse is insistent that he can improve and that the hospital is "doing something to him" (or not doing something to him) that is causing him to be this way. There are significant emotional and psychological roadblocks from both patient and family that have made determining severity of patient issues/origins of complaints up until this point, quite difficult. Will continue to provide emotional and social support to spouse.

## 2020-07-02 NOTE — Progress Notes (Signed)
Pharmacy Antibiotic Note  Loye Vento is a 74 y.o. male admitted on 06/15/2020 with aspiration PNA.  Pharmacy has been consulted for Unasyn dosing. Noted AKI - SCr up to 4.42 (baseline 1.5-1.7), CrCl~15-20.  Plan: Unasyn 3g IV q12h Monitor clinical progress, c/s, renal function F/u de-escalation plan/LOT   Weight: 117.9 kg (259 lb 14.8 oz)  Temp (24hrs), Avg:98.8 F (37.1 C), Min:98.2 F (36.8 C), Max:99.3 F (37.4 C)  Recent Labs  Lab 06/26/20 0444 06/26/20 1047 06/27/20 0452 06/28/20 1102 07/01/20 0257 07/02/20 0411 07/02/20 0730  WBC  --  5.0 6.0 10.8*  --   --  16.2*  CREATININE 1.71*  --  1.60* 1.92* 2.46* 4.42*  --     Estimated Creatinine Clearance: 18.8 mL/min (A) (by C-G formula based on SCr of 4.42 mg/dL (H)).    Allergies  Allergen Reactions  . Tape Other (See Comments)    Causes skin irritation- use sensitive tape ONLY!!    Arturo Morton, PharmD, BCPS Please check AMION for all [facility] contact numbers Clinical Pharmacist 07/02/2020 9:12 AM

## 2020-07-02 NOTE — Consult Note (Signed)
[facility] Renal Consultation Note  Requesting MD: Mikhail Indication for Consultation: A on CRF  HPI:  Mateusz Neilan is a 74 y.o. male with past medical history significant for type 2 diabetes mellitus, hypertension, OSA, history of stroke, atrial fibrillation, coronary artery disease with ejection fraction of 25 to 30% and also some pulmonary hypertension.  He was noted to have been hospitalized in September with hyperkalemia and acute on chronic renal failure.  This was felt secondary to his ACE inhibitor, Aldactone.  Those medications were held and his diuretics decreased and he was sent home.  He really failed to thrive at home, getting weaker and more short of breath with edema.  He was admitted on 10/16 for evaluation of above.  He was diuresed a total of 4 L per charting by October 21, -5 L by October 23, -6.6 L by the 26th.  Creatinine was 1.92 on 10/25.  It then was not checked again until 1028 when noted to be 2.46 at which time was 7 liters negative-now is 4.42 today.  It is unclear the timeline on decreased mental status but he is definitely had decreased mental status-unable to take p.o.'s.  He is currently in his room moaning, yelling out that he wants to talk to [place].  He is not able to answer questions.  Wife is tearful at bedside.  I spoke to a nurse Santiago Glad who is his niece and an Therapist, sports in a different state.  Basically the family is very unhappy with care to date.  They feel that this decline was created by our care.  They state many times that he is a veteran and that the ball has been dropped.  The niece is asking about TPN  Creatinine, Ser  Date/Time Value Ref Range Status  07/02/2020 04:11 AM 4.42 (H) 0.61 - 1.24 mg/dL Final    Comment:    DELTA CHECK NOTED  07/01/2020 02:57 AM 2.46 (H) 0.61 - 1.24 mg/dL Final  06/28/2020 11:02 AM 1.92 (H) 0.61 - 1.24 mg/dL Final  06/27/2020 04:52 AM 1.60 (H) 0.61 - 1.24 mg/dL Final  06/26/2020 04:44 AM 1.71 (H) 0.61 - 1.24 mg/dL  Final  06/25/2020 09:57 AM 1.63 (H) 0.61 - 1.24 mg/dL Final  06/24/2020 03:14 AM 1.76 (H) 0.61 - 1.24 mg/dL Final  06/23/2020 04:50 AM 1.51 (H) 0.61 - 1.24 mg/dL Final  06/22/2020 02:04 AM 1.65 (H) 0.61 - 1.24 mg/dL Final  06/21/2020 05:06 AM 1.72 (H) 0.61 - 1.24 mg/dL Final  06/20/2020 05:00 AM 1.65 (H) 0.61 - 1.24 mg/dL Final  06/05/2020 05:30 PM 1.66 (H) 0.61 - 1.24 mg/dL Final  05/09/2020 07:09 AM 1.66 (H) 0.61 - 1.24 mg/dL Final  05/08/2020 11:31 AM 1.83 (H) 0.61 - 1.24 mg/dL Final  05/07/2020 01:47 PM 2.09 (H) 0.61 - 1.24 mg/dL Final  05/06/2020 08:35 PM 2.50 (H) 0.61 - 1.24 mg/dL Final  05/06/2020 06:57 PM 2.47 (H) 0.61 - 1.24 mg/dL Final  05/06/2020 11:25 AM 2.55 (H) 0.61 - 1.24 mg/dL Final  05/05/2020 10:31 PM 3.30 (H) 0.61 - 1.24 mg/dL Final  05/05/2020 01:49 PM 3.31 (H) 0.61 - 1.24 mg/dL Final  11/26/2019 04:00 AM 1.83 (H) 0.61 - 1.24 mg/dL Final  11/25/2019 08:50 AM 1.71 (H) 0.61 - 1.24 mg/dL Final  11/24/2019 05:17 AM 1.98 (H) 0.61 - 1.24 mg/dL Final  11/23/2019 04:03 AM 1.85 (H) 0.61 - 1.24 mg/dL Final  11/22/2019 03:43 AM 1.77 (H) 0.61 - 1.24 mg/dL Final  11/21/2019 03:29 AM 1.86 (H) 0.61 -  1.24 mg/dL Final  11/20/2019 03:36 AM 1.83 (H) 0.61 - 1.24 mg/dL Final  11/19/2019 10:07 PM 1.84 (H) 0.61 - 1.24 mg/dL Final  02/01/2017 09:01 AM 1.32 (H) 0.61 - 1.24 mg/dL Final  04/05/2015 04:43 AM 1.50 (H) 0.61 - 1.24 mg/dL Final  04/04/2015 04:45 AM 1.62 (H) 0.61 - 1.24 mg/dL Final  04/03/2015 05:50 AM 1.60 (H) 0.61 - 1.24 mg/dL Final  04/02/2015 11:35 AM 1.74 (H) 0.61 - 1.24 mg/dL Final  09/15/2014 04:41 AM 1.29 0.50 - 1.35 mg/dL Final  09/14/2014 03:11 AM 1.33 0.50 - 1.35 mg/dL Final  09/13/2014 03:03 AM 1.20 0.50 - 1.35 mg/dL Final  09/11/2014 04:45 AM 1.33 0.50 - 1.35 mg/dL Final  09/10/2014 05:30 PM 1.52 (H) 0.50 - 1.35 mg/dL Final  09/08/2014 09:34 PM 1.42 (H) 0.50 - 1.35 mg/dL Final  10/20/2012 04:33 PM 0.96 0.50 - 1.35 mg/dL Final  10/20/2012 03:03 PM 1.10 0.50 - 1.35  mg/dL Final  06/26/2007 03:40 PM 1.32  Final     PMHx:   Past Medical History:  Diagnosis Date  . AICD (automatic cardioverter/defibrillator) present   . Anxiety   . Arthritis    "occasionally in my right hand" (04/02/2015)  . CAD in native artery 1999  . Cancer ([place])   . CHF (congestive heart failure) ([place])   . Depression   . GERD (gastroesophageal reflux disease)    "occasionally" (04/02/2015)  . History of hiatal hernia   . Hypercholesterolemia   . Hypertension   . Myocardial infarction ([place]) "several"  . OSA on CPAP   . PAF (paroxysmal atrial fibrillation) ([place])   . PTSD (post-traumatic stress disorder)   . Stroke ([place])    "I've had 2; my right leg & arm slightly weaker since" (04/02/2015)  . Type II diabetes mellitus ([place])     Past Surgical History:  Procedure Laterality Date  . BIOPSY  05/09/2020   Procedure: BIOPSY;  Surgeon: Ladene Artist, MD;  Location: [facility] ENDOSCOPY;  Service: Endoscopy;;  . [place]  . COLONOSCOPY WITH PROPOFOL N/A 05/09/2020   Procedure: COLONOSCOPY WITH PROPOFOL;  Surgeon: Ladene Artist, MD;  Location: [facility] ENDOSCOPY;  Service: Endoscopy;  Laterality: N/A;  . CORONARY ANGIOPLASTY WITH STENT PLACEMENT     "I've got 7 stents total" (04/02/2015)  . CORONARY ARTERY BYPASS GRAFT  1999   CABG X4; LIMA-LAD, SVG-OM1, SVG-RI, SVG-dRCA  . CORONARY STENT INTERVENTION N/A 11/24/2019   Procedure: CORONARY STENT INTERVENTION;  Surgeon: [name], MD;  Location: [place] CV LAB;  Service: Cardiovascular;  Laterality: N/A;  . INTRAVASCULAR ULTRASOUND/IVUS N/A 11/24/2019   Procedure: Intravascular Ultrasound/IVUS;  Surgeon: [name], MD;  Location: [place] CV LAB;  Service: Cardiovascular;  Laterality: N/A;  . LEFT HEART CATH N/A 11/24/2019   Procedure: Left Heart Cath;  Surgeon: [name], MD;  Location: [place] CV LAB;  Service: Cardiovascular;  Laterality: N/A;  . LEFT HEART CATH AND  CORS/GRAFTS ANGIOGRAPHY N/A 11/21/2019   Procedure: LEFT HEART CATH AND CORS/GRAFTS ANGIOGRAPHY;  Surgeon: Burnell Blanks, MD;  Location: [place] CV LAB;  Service: Cardiovascular;  Laterality: N/A;  . LEFT HEART CATHETERIZATION WITH CORONARY/GRAFT ANGIOGRAM N/A 09/14/2014   Procedure: LEFT HEART CATHETERIZATION WITH Beatrix Fetters;  Surgeon: Leonie Man, MD;  Location: [facility] CATH LAB;  Service: Cardiovascular;  Laterality: N/A;  . PERCUTANEOUS CORONARY STENT INTERVENTION (PCI-S) Left 09/14/2014   Procedure: PERCUTANEOUS CORONARY STENT INTERVENTION (PCI-S);  Surgeon: Leonie Man, MD;  Location: [place] CATH LAB;  Service: Cardiovascular;  Laterality: Left;  . POLYPECTOMY  05/09/2020   Procedure: POLYPECTOMY;  Surgeon: Ladene Artist, MD;  Location: [facility] ENDOSCOPY;  Service: Endoscopy;;    Family Hx:  Family History  Problem Relation Age of Onset  . Diabetes Mellitus II Mother   . Heart disease Father     Social History:  reports that he has quit smoking. His smoking use included cigarettes. He has a 0.50 pack-year smoking history. He has never used smokeless tobacco. He reports current alcohol use. He reports that he does not use drugs.  Allergies:  Allergies  Allergen Reactions  . Tape Other (See Comments)    Causes skin irritation- use sensitive tape ONLY!!    Medications: Prior to Admission medications   Medication Sig Start Date End Date Taking? Authorizing Provider  acetaminophen (TYLENOL) 325 MG tablet Take 2 tablets (650 mg total) by mouth every 4 (four) hours as needed for headache or mild pain. Patient taking differently: Take 325-650 mg by mouth every 4 (four) hours as needed for headache or mild pain.  09/15/14  Yes Kilroy, Doreene Burke, PA-C  apixaban (ELIQUIS) 5 MG TABS tablet Take 1 tablet (5 mg total) by mouth 2 (two) times daily. Start taking on 05/12/20 Patient taking differently: Take 5 mg by mouth 2 (two) times daily.  05/12/20  Yes Oswald Hillock, MD   carvedilol (COREG) 3.125 MG tablet Take 1 tablet (3.125 mg total) by mouth 2 (two) times daily with a meal. 11/26/19 06/16/2020 Yes Amin, Jeanella Flattery, MD  Cholecalciferol (VITAMIN D-3) 25 MCG (1000 UT) CAPS Take 1,000 Units by mouth daily.   Yes [provider]  cholestyramine (QUESTRAN) 4 g packet Take 1 packet (4 g total) by mouth daily at 2 PM. 05/10/20  Yes Darrick Meigs, Marge Duncans, MD  clopidogrel (PLAVIX) 75 MG tablet Take 1 tablet (75 mg total) by mouth daily. Start taking on 05/14/20 Patient taking differently: Take 75 mg by mouth daily.  05/14/20  Yes Oswald Hillock, MD  FLUoxetine (PROZAC) 20 MG tablet Take 40 mg by mouth at bedtime.   Yes [provider]  hydroxypropyl methylcellulose / hypromellose (ISOPTO TEARS / GONIOVISC) 2.5 % ophthalmic solution Place 1 drop into both eyes 2 (two) times daily as needed for dry eyes.    Yes [provider]  insulin regular human CONCENTRATED (HUMULIN R U-500 KWIKPEN) 500 UNIT/ML kwikpen Inject 55-110 Units into the skin See admin instructions. Inject 110 units into the skin in the morning before breakfast and 55 units at bedtime   Yes [provider]  isosorbide mononitrate (IMDUR) 30 MG 24 hr tablet Take 1 tablet (30 mg total) by mouth daily. 11/27/19  Yes Amin, Jeanella Flattery, MD  ketoconazole (NIZORAL) 2 % cream Apply 1 application topically daily as needed for irritation (to affected areas).    Yes [provider]  nitroGLYCERIN (NITROSTAT) 0.4 MG SL tablet Place 1 tablet (0.4 mg total) under the tongue every 5 (five) minutes x 3 doses as needed for chest pain. 09/15/14  Yes Erlene Quan, PA-C  PRESCRIPTION MEDICATION See admin instructions. CPAP- Use as directed during ANY time of rest   Yes [provider]  rosuvastatin (CRESTOR) 20 MG tablet Take 1 tablet (20 mg total) by mouth at bedtime. 11/26/19  Yes Amin, Jeanella Flattery, MD  torsemide (DEMADEX) 20 MG tablet Take 1 tablet (20 mg total) by mouth daily. 05/10/20   Yes Oswald Hillock, MD  dapagliflozin propanediol (FARXIGA) 10 MG TABS tablet Take 1 tablet (10 mg total) by mouth daily. 06/28/20   Charlynne Cousins, MD  hydrALAZINE (APRESOLINE) 25 MG tablet Take 1 tablet (25 mg total) by mouth every 6 (six) hours. 06/28/20   Charlynne Cousins, MD  torsemide (DEMADEX) 20 MG tablet Take 2 tablets (40 mg total) by mouth daily. 06/28/20   Charlynne Cousins, MD    I have reviewed the patient's current medications.  Labs:  Results for orders placed or performed during the hospital encounter of 06/18/2020 (from the past 48 hour(s))  Glucose, capillary     Status: None   Collection Time: 06/30/20  5:29 PM  Result Value Ref Range   Glucose-Capillary 90 70 - 99 mg/dL    Comment: Glucose reference range applies only to samples taken after fasting for at least 8 hours.   Comment 1 Notify RN    Comment 2 Document in Chart   Glucose, capillary     Status: None   Collection Time: 06/30/20  9:16 PM  Result Value Ref Range   Glucose-Capillary 79 70 - 99 mg/dL    Comment: Glucose reference range applies only to samples taken after fasting for at least 8 hours.  Basic metabolic panel     Status: Abnormal   Collection Time: 07/01/20  2:57 AM  Result Value Ref Range   Sodium 133 (L) 135 - 145 mmol/L   Potassium 4.0 3.5 - 5.1 mmol/L   Chloride 96 (L) 98 - 111 mmol/L   CO2 22 22 - 32 mmol/L   Glucose, Bld 127 (H) 70 - 99 mg/dL    Comment: Glucose reference range applies only to samples taken after fasting for at least 8 hours.   BUN 73 (H) 8 - 23 mg/dL   Creatinine, Ser 2.46 (H) 0.61 - 1.24 mg/dL   Calcium 8.2 (L) 8.9 - 10.3 mg/dL   GFR, Estimated 26 (L) >60 mL/min    Comment: (NOTE) Calculated using the CKD-EPI Creatinine Equation (2021)    Anion gap 15 5 - 15    Comment: Performed at [facility] [address], [place], [place] [address]  Glucose, capillary     Status: Abnormal   Collection Time: 07/01/20  6:34 AM  Result Value Ref Range    Glucose-Capillary 167 (H) 70 - 99 mg/dL    Comment: Glucose reference range applies only to samples taken after fasting for at least 8 hours.  Blood gas, arterial     Status: Abnormal   Collection Time: 07/01/20  7:33 AM  Result Value Ref Range   FIO2 100.00    pH, Arterial 7.475 (H) 7.35 - 7.45   pCO2 arterial 28.4 (L) 32 - 48 mmHg   pO2, Arterial 114 (H) 83 - 108 mmHg   Bicarbonate 20.7 20.0 - 28.0 mmol/L   Acid-base deficit 2.4 (H) 0.0 - 2.0 mmol/L   O2 Saturation 98.7 %   Patient temperature 37.0    Collection site RIGHT RADIAL    Drawn by COLLECTED BY RT     Comment: [id number]   Sample type ARTERIAL DRAW    Allens test (pass/fail) PASS PASS    Comment: Performed at [facility], [place] [address] [place], [place] [address]  Glucose, capillary     Status: Abnormal   Collection Time: 07/01/20 12:01 PM  Result Value Ref Range   Glucose-Capillary 228 (H) 70 - 99 mg/dL    Comment: Glucose reference range applies  only to samples taken after fasting for at least 8 hours.  Magnesium     Status: None   Collection Time: 07/01/20  1:15 PM  Result Value Ref Range   Magnesium 1.9 1.7 - 2.4 mg/dL    Comment: Performed at [facility] [address], [place], [place] [address]  Glucose, capillary     Status: Abnormal   Collection Time: 07/01/20  4:09 PM  Result Value Ref Range   Glucose-Capillary 264 (H) 70 - 99 mg/dL    Comment: Glucose reference range applies only to samples taken after fasting for at least 8 hours.  Glucose, capillary     Status: Abnormal   Collection Time: 07/01/20  5:14 PM  Result Value Ref Range   Glucose-Capillary 253 (H) 70 - 99 mg/dL    Comment: Glucose reference range applies only to samples taken after fasting for at least 8 hours.  Glucose, capillary     Status: Abnormal   Collection Time: 07/01/20  9:55 PM  Result Value Ref Range   Glucose-Capillary 282 (H) 70 - 99 mg/dL    Comment: Glucose reference range applies only to samples taken after  fasting for at least 8 hours.  Glucose, capillary     Status: Abnormal   Collection Time: 07/02/20 12:30 AM  Result Value Ref Range   Glucose-Capillary 281 (H) 70 - 99 mg/dL    Comment: Glucose reference range applies only to samples taken after fasting for at least 8 hours.  Glucose, capillary     Status: Abnormal   Collection Time: 07/02/20  4:06 AM  Result Value Ref Range   Glucose-Capillary 287 (H) 70 - 99 mg/dL    Comment: Glucose reference range applies only to samples taken after fasting for at least 8 hours.  Basic metabolic panel     Status: Abnormal   Collection Time: 07/02/20  4:11 AM  Result Value Ref Range   Sodium 133 (L) 135 - 145 mmol/L   Potassium 4.6 3.5 - 5.1 mmol/L   Chloride 97 (L) 98 - 111 mmol/L   CO2 19 (L) 22 - 32 mmol/L   Glucose, Bld 301 (H) 70 - 99 mg/dL    Comment: Glucose reference range applies only to samples taken after fasting for at least 8 hours.   BUN 111 (H) 8 - 23 mg/dL   Creatinine, Ser 4.42 (H) 0.61 - 1.24 mg/dL    Comment: DELTA CHECK NOTED   Calcium 8.1 (L) 8.9 - 10.3 mg/dL   GFR, Estimated 13 (L) >60 mL/min    Comment: (NOTE) Calculated using the CKD-EPI Creatinine Equation (2021)    Anion gap 17 (H) 5 - 15    Comment: Performed at [facility] [address], [place], [place] [address]  Glucose, capillary     Status: Abnormal   Collection Time: 07/02/20  6:20 AM  Result Value Ref Range   Glucose-Capillary 282 (H) 70 - 99 mg/dL    Comment: Glucose reference range applies only to samples taken after fasting for at least 8 hours.  Sodium, urine, random     Status: None   Collection Time: 07/02/20  6:35 AM  Result Value Ref Range   Sodium, Ur <10 mmol/L    Comment: Performed at [facility] [address], [place], [place] [address]  Creatinine, urine, random     Status: None   Collection Time: 07/02/20  6:35 AM  Result Value Ref Range   Creatinine, Urine 213.77  mg/dL    Comment: Performed at [facility],  [place] [address], [place], [place] [address]  Osmolality, urine     Status: None   Collection Time: 07/02/20  6:35 AM  Result Value Ref Range   Osmolality, Ur 351 300 - 900 mOsm/kg    Comment: Performed at [facility] [address], [place], [place] [address]  Urinalysis, Routine w reflex microscopic Urine, Clean Catch     Status: Abnormal   Collection Time: 07/02/20  6:36 AM  Result Value Ref Range   Color, Urine AMBER (A) YELLOW    Comment: BIOCHEMICALS MAY BE AFFECTED BY COLOR   APPearance CLOUDY (A) CLEAR   Specific Gravity, Urine 1.016 1.005 - 1.030   pH 5.0 5.0 - 8.0   Glucose, UA 50 (A) NEGATIVE mg/dL   Hgb urine dipstick LARGE (A) NEGATIVE   Bilirubin Urine NEGATIVE NEGATIVE   Ketones, ur NEGATIVE NEGATIVE mg/dL   Protein, ur 100 (A) NEGATIVE mg/dL   Nitrite NEGATIVE NEGATIVE   Leukocytes,Ua LARGE (A) NEGATIVE   RBC / HPF >50 (H) 0 - 5 RBC/hpf   WBC, UA >50 (H) 0 - 5 WBC/hpf   Bacteria, UA RARE (A) NONE SEEN   Squamous Epithelial / LPF 0-5 0 - 5   WBC Clumps PRESENT    Hyaline Casts, UA PRESENT    Granular Casts, UA PRESENT     Comment: Performed at [facility], [place] [address], [place], [place] [address]  CBC     Status: Abnormal   Collection Time: 07/02/20  7:30 AM  Result Value Ref Range   WBC 16.2 (H) 4.0 - 10.5 K/uL   RBC 3.85 (L) 4.22 - 5.81 MIL/uL   Hemoglobin 12.0 (L) 13.0 - 17.0 g/dL   HCT 36.1 (L) 39 - 52 %   MCV 93.8 80.0 - 100.0 fL   MCH 31.2 26.0 - 34.0 pg   MCHC 33.2 30.0 - 36.0 g/dL   RDW 15.1 11.5 - 15.5 %   Platelets 121 (L) 150 - 400 K/uL   nRBC 0.0 0.0 - 0.2 %    Comment: Performed at [facility], [place] [address], [place], [place] [address]  Hepatic function panel     Status: Abnormal   Collection Time: 07/02/20  7:30 AM  Result Value Ref Range   Total Protein 5.7 (L) 6.5 - 8.1 g/dL   Albumin 2.2 (L) 3.5 - 5.0 g/dL   AST 43 (H) 15 - 41 U/L   ALT 20 0 - 44 U/L   Alkaline Phosphatase 118 38 - 126 U/L   Total Bilirubin 1.3 (H)  0.3 - 1.2 mg/dL   Bilirubin, Direct 0.6 (H) 0.0 - 0.2 mg/dL   Indirect Bilirubin 0.7 0.3 - 0.9 mg/dL    Comment: Performed at [facility] [address], [place], [place] [address]  Folate     Status: None   Collection Time: 07/02/20 10:13 AM  Result Value Ref Range   Folate 12.0 >5.9 ng/mL    Comment: Performed at [facility] [address] [place], [place] [address]  TSH     Status: None   Collection Time: 07/02/20 10:13 AM  Result Value Ref Range   TSH 1.704 0.350 - 4.500 uIU/mL    Comment: Performed by a 3rd Generation assay with a functional sensitivity of <=0.01 uIU/mL. Performed at [facility], [place] [address], [place], [place] [address]   Vitamin  B12     Status: None   Collection Time: 07/02/20 10:13 AM  Result Value Ref Range   Vitamin B-12 434 180 - 914 pg/mL    Comment: (NOTE) This assay is not validated for testing neonatal or myeloproliferative syndrome specimens for Vitamin B12 levels. Performed at [facility], [place] [address], [place], [place] [address]   Glucose, capillary     Status: Abnormal   Collection Time: 07/02/20 11:26 AM  Result Value Ref Range   Glucose-Capillary 247 (H) 70 - 99 mg/dL    Comment: Glucose reference range applies only to samples taken after fasting for at least 8 hours.     ROS:  Review of systems not obtained due to patient factors.  Physical Exam: Vitals:   07/02/20 0800 07/02/20 1144  BP: (!) 110/47 (!) 112/52  Pulse:  78  Resp:  18  Temp:  98.4 F (36.9 C)  SpO2: 95% 93%     General: Obese white male, moaning and calling out that he wants to talk to [place]-unable to answer any question HEENT: Pupils are equal round reactive to light, extraocular motions are intact, mucous membranes are moist Neck: No appreciable JVD Heart: Regular rate and rhythm Lungs: Poor effort, decreased breath sounds at the bases Abdomen: Obese, soft, nontender Extremities: Does have some pitting edema to dependent  areas Skin: Warm and dry Neuro: Delirium  Assessment/Plan: 74 year old white male with multiple medical issues including cardiomyopathy and CKD.  Patient had hospitalization last month where diuretics were decreased.  Presented with volume overload, had successful diuresis of at least 7 L-now has developed some acute on chronic kidney injury 1.Renal- acute on chronic renal failure.  Really seeming hemodynamic given the scenario.  However, has RBCs and WBCs in his urine-urine cultures pending.  Since he is 7 L negative and did have some soft blood pressures, diuretics now on hold.  Was only receiving fluids at 50 an hour.  Family seems focused that this is not enough.  We will go ahead and increase the fluid to 125 an hour temporarily.  In addition, to increase renal perfusion will discontinue hydralazine.  We will also discontinue Farxiga for decreased GFR.  I would have a very difficult time introducing dialysis into this scenario given his significant and complicated past medical history.  I did raise this issue to the niece who is an Therapist, sports.  She would not give me a concrete answer.  She said that what I see today is not him.  I am really hoping we can make some headway here and turn around his kidney function 2. Hypertension/volume  -cardiomyopathy.  Has had successful diuresis of 7 L this hospitalization.  Now diuretics on hold giving a little bit of fluid back and stopping hydralazine to help increase renal perfusion.  His cardiomyopathy makes him a very poor candidate for something like dialysis 3.  Delirium-complicated issue and very unsettling to the family.  It could be uremia.  I have also stopped his Ativan. 4. Anemia  -not an issue at this time, hemoglobin 12.  This does argue for some volume depletion 5.  Nutrition-albumin in the twos which obviously did not happen overnight.  Family is also obsessed on this subject niece raised the issue of TPN.  I said that we would need to do tube feeds  first-she was not happy about that   Louis Meckel 07/02/2020, 12:19 PM

## 2020-07-02 NOTE — Progress Notes (Addendum)
PROGRESS NOTE    Vernon Briggs  TMA:[id number] DOB: Feb 05, 1946 DOA: 06/26/2020 PCP: [name], MD   Brief Narrative:  HPI on 07/01/2020 by Dr. Gilles Chiquito Zabdiel Dripps is a 74 y.o. male with medical history significant of DM2, H/O stroke, PAF, OSA, HTN, CAD with chronic HFrEF, GERD, anxiety, HLD, HTN who presented with worsening SOB and swelling.  Per patient and his wife, Mr. Wyne was recently in the hospital in September for renal failure and hyperkalemia.  He had some mild SOB at that time. Due to the issues with his kidneys, he had his lisinopril, spironolactone stopped and his torsemide halved to 20mg  daily.  His wife monitors his medications and can attest to these changes.  Since that time he has been weaker at home and has been slowly getting worse over the last week and a half.  Specifically he has been having worsening SOB, worsening lower extremity edema, decreased PO intake, worsening weakness and increasing blood pressure.  She takes his blood pressure daily and noted an increase to the 160s/70s at home.  He further has been having worsening orthopnea and increased PND.  He tried to use his home CPAP today to help and this did not improve his symptoms so they called EMS.  He was placed on oxygen and feels better.  He further has had a mild cough in the hospital and wheezing.  He denies chest pain, fever, chills, nausea, change in vision, headache, abdominal pain, recent falls.  He has had generalized weakness, chronic diarrhea (unchanged) and swelling.    Interim history Patient admitted with respiratory failure, CHF exacerbation.  Now complaining of headache, CT head obtained and unremarkable for acute findings.  Overnight, patient became, patient placed on nonrebreather and on supplemental oxygen.  ABG unremarkable, chest x-ray obtained with no acute infection. Assessment & Plan   Acute respiratory failure with hypoxia secondary acute on chronic systolic heart failure  exacerbation -Thought to be secondary to noncompliance with medications -Patient required oxygen upon admission -He had presented with generalized weakness as well as swelling and shortness of breath -Echocardiogram: EF 25-63%, RV systolic function mildly reduced -Patient was placed on IV Lasix 80 mg -Cardiology consulted and appreciated -He has been transitioned to oral torsemide-which has been held due to AKI -Patient was weaned to room air however overnight became dyspneic and somewhat apneic.  He was placed on nonrebreather.  Currently on 4 L of oxygen- will attempt to wean if possible  Acute metabolic encephalopathy -Patient with confusion.  Whether this is due to acute kidney injury versus underlying infection. -CT head obtained on 06/30/2020 showed no acute findings, old lacunar strokes -Unable to obtain MRI due to AICD placement -Chest x-ray unremarkable from -UA and culture pending -Patient with leukocytosis, 16 today -May start empirically on Unasyn for possible aspiration - however after speaking with pharmacy, changed to cefepime and flagyl -Continue to monitor closely -will order TSH, Vit B 1 and 12, folate  Acute kidney injury on chronic kidney disease, stage IIIb -Suspect secondary to poor oral intake along with medications -Torsemide currently held -Despite IVF (though gentle given CHF), creatinine worsening and up to 4.42 (baseline 1.5-1.7) -Continue IV fluids -Have ordered UA and culture, renal ultrasound, urine electrolytes -Continue to monitor output, however continues to pull off condom catheter.  Will not place Foley catheter at this time for fear that patient will pull this out as well. -Nephrology consulted and appreciated  Elevated troponin -Suspect demand ischemia secondary to the above -Continue  Plavix, Eliquis, Coreg, Imdur, Crestor  Diabetes mellitus, type II -Continue Lantus, dose modified along with novolog q4hrs scheduled and ISS with CVB  monitoring -Farxiga discontinued   Paroxysmal atrial fibrillation -Continue Eliquis, Coreg  Essential hypertension -Stable -continue Coreg, hydralazine, Demadex, Imdur  Obstructive sleep apnea -Continue CPAP- however he has declined to use it -was apneic overnight, and placed on NRB, now on 4L supplemental O2  Fever -Patient had fever approximately 1 week ago -UA and CXR were unremarkable for infection -Thought to be secondary to thrombophlebitis and chronic diarrhea -Patient has been afebrile for more than 72 hours  Headache -Per wife, patient has had a headache for 3 days -Patient tells me his headache starts in the back of his head.  Denies any previous head injury, nausea or vomiting, sensitivity. -Suspect this is more tension type headache -Given his history of CVA, CT head obtained and was unremarkable for acute findings -Have ordered Fioricet along with K pad -Continue to monitor closely  Physical deconditioning -PT and OT recommending SNF -Patient has been unable to work with PT and OT given his pain and headache -Hopefully as his headache improves, he will be able to work more with therapy -SNF was declined by [place] [facility] -planning for patient to be discharged to home with home health services  Goals of care -Patient currently DNR however continues to have minimal improvement -Currently not eating or drinking much -Have asked for nursing to document amount of intake -Have consulted palliative care for further goals of care discussion -Feel patient may benefit from possible discussion of hospice  Poor oral intake/possible dysphagia and aspiration -Speech therapy consulted along with nutrition -Have ordered placement of core track however concerned the patient may pull this out -As above, have asked for nursing to document patient's oral intake  Prolonged QT -EKG obtained overnight as patient was not feeling well and stated he had pain -Remarkable for ACS  finding -However does show a prolonged QT -Potassium 4.6, magnesium 1.9  Right extremity pain -will order doppler   DVT Prophylaxis Eliquis  Code Status: DNR  Family Communication: Wife via phone 10/27, 10/28. Discussed with niece on 10/29  Disposition Plan:  Status is: Inpatient  Remains inpatient appropriate because:Ongoing diagnostic testing needed not appropriate for outpatient work up and Unsafe d/c plan   Dispo: The patient is from: Home              Anticipated d/c is to: SNF vs Home w/ [facility]              Anticipated d/c date is: > 3 days              Patient currently is not medically stable to d/c.   Consultants Cardiology Nephrology Palliative care  Procedures  Echocardiogram  Antibiotics   Anti-infectives (From admission, onward)   None      Subjective:   Vernon Briggs seen and examined today.  Patient continues to say stop it.  Is able to answer some questions.  Denies pain at this time, or headache.  Denies any shortness of breath or chest pain.    Objective:   Vitals:   07/01/20 1800 07/01/20 1929 07/01/20 2333 07/02/20 0440  BP: (!) 114/45 137/61 (!) 98/50 108/88  Pulse: 80     Resp: (!) 21   20  Temp:    99.3 F (37.4 C)  TempSrc:    Axillary  SpO2: 95%   96%  Weight:   117.9 kg  Intake/Output Summary (Last 24 hours) at 07/02/2020 0848 Last data filed at 07/02/2020 0452 Gross per 24 hour  Intake 1010.06 ml  Output --  Net 1010.06 ml   Filed Weights   06/28/20 0500 06/29/20 0529 07/01/20 2333  Weight: 118.3 kg 115.1 kg 117.9 kg   Exam  General: Well developed, chronically ill-appearing  HEENT: NCAT,mucous membranes moist.   Cardiovascular: S1 S2 auscultated, irregular  Respiratory: Diminished breath sounds  Abdomen: Soft, obese, nontender, nondistended, + bowel sounds  Extremities: warm dry without cyanosis clubbing  Neuro: AAOx1 (self only), nonfocal however confused  Data Reviewed: I have personally reviewed  following labs and imaging studies  CBC: Recent Labs  Lab 06/26/20 1047 06/27/20 0452 06/28/20 1102 07/02/20 0730  WBC 5.0 6.0 10.8* 16.2*  NEUTROABS 3.3 4.3 9.9*  --   HGB 11.9* 12.3* 11.8* 12.0*  HCT 34.8* 35.8* 34.7* 36.1*  MCV 93.8 93.0 93.8 93.8  PLT 80* 82* 101* 716*   Basic Metabolic Panel: Recent Labs  Lab 06/26/20 0444 06/27/20 0452 06/28/20 1102 07/01/20 0257 07/01/20 1315 07/02/20 0411  NA 134* 137 136 133*  --  133*  K 3.3* 3.6 3.5 4.0  --  4.6  CL 99 100 100 96*  --  97*  CO2 24 24 24 22   --  19*  GLUCOSE 183* 98 185* 127*  --  301*  BUN 46* 53* 52* 73*  --  111*  CREATININE 1.71* 1.60* 1.92* 2.46*  --  4.42*  CALCIUM 8.4* 8.6* 8.2* 8.2*  --  8.1*  MG  --   --  2.1  --  1.9  --    GFR: Estimated Creatinine Clearance: 18.8 mL/min (A) (by C-G formula based on SCr of 4.42 mg/dL (H)). Liver Function Tests: Recent Labs  Lab 07/02/20 0730  AST 43*  ALT 20  ALKPHOS 118  BILITOT 1.3*  PROT 5.7*  ALBUMIN 2.2*   No results for input(s): LIPASE, AMYLASE in the last 168 hours. No results for input(s): AMMONIA in the last 168 hours. Coagulation Profile: No results for input(s): INR, PROTIME in the last 168 hours. Cardiac Enzymes: No results for input(s): CKTOTAL, CKMB, CKMBINDEX, TROPONINI in the last 168 hours. BNP (last 3 results) No results for input(s): PROBNP in the last 8760 hours. HbA1C: No results for input(s): HGBA1C in the last 72 hours. CBG: Recent Labs  Lab 07/01/20 1714 07/01/20 2155 07/02/20 0030 07/02/20 0406 07/02/20 0620  GLUCAP 253* 282* 281* 287* 282*   Lipid Profile: No results for input(s): CHOL, HDL, LDLCALC, TRIG, CHOLHDL, LDLDIRECT in the last 72 hours. Thyroid Function Tests: No results for input(s): TSH, T4TOTAL, FREET4, T3FREE, THYROIDAB in the last 72 hours. Anemia Panel: No results for input(s): VITAMINB12, FOLATE, FERRITIN, TIBC, IRON, RETICCTPCT in the last 72 hours. Urine analysis:    Component Value Date/Time    COLORURINE YELLOW 06/24/2020 2030   [place] 06/08/2020 2030   [place] 1.014 06/12/2020 2030   [place] 5.0 07/04/2020 2030   GLUCOSEU NEGATIVE 06/30/2020 2030   [place] 06/16/2020 2030   [place] 07/02/2020 2030   [place] 07/02/2020 2030   PROTEINUR 100 (A) 06/16/2020 2030   UROBILINOGEN 0.2 04/02/2015 1341   NITRITE NEGATIVE 06/23/2020 2030   LEUKOCYTESUR NEGATIVE 06/15/2020 2030   Sepsis Labs: @LABRCNTIP (procalcitonin:4,lacticidven:4)  ) Recent Results (from the past 240 hour(s))  Respiratory Panel by RT PCR (Flu A&B, Covid) - Nasopharyngeal Swab     Status: None   Collection Time: 06/29/20 12:52 PM  Specimen: Nasopharyngeal Swab  Result Value Ref Range Status   SARS Coronavirus 2 by RT PCR NEGATIVE NEGATIVE Final    Comment: (NOTE) SARS-CoV-2 target nucleic acids are NOT DETECTED.  The SARS-CoV-2 RNA is generally detectable in upper respiratoy specimens during the acute phase of infection. The lowest concentration of SARS-CoV-2 viral copies this assay can detect is 131 copies/mL. A negative result does not preclude SARS-Cov-2 infection and should not be used as the sole basis for treatment or other patient management decisions. A negative result may occur with  improper specimen collection/handling, submission of specimen other than nasopharyngeal swab, presence of viral mutation(s) within the areas targeted by this assay, and inadequate number of viral copies (<131 copies/mL). A negative result must be combined with clinical observations, patient history, and epidemiological information. The expected result is Negative.  Fact Sheet for Patients:  PinkCheek.be  Fact Sheet for Healthcare Providers:  GravelBags.it  This test is no t yet approved or cleared by the [place] FDA and  has been authorized for detection and/or diagnosis of SARS-CoV-2 by FDA under an  Emergency Use Authorization (EUA). This EUA will remain  in effect (meaning this test can be used) for the duration of the COVID-19 declaration under Section 564(b)(1) of the Act, 21 U.S.C. section 360bbb-3(b)(1), unless the authorization is terminated or revoked sooner.     Influenza A by PCR NEGATIVE NEGATIVE Final   Influenza B by PCR NEGATIVE NEGATIVE Final    Comment: (NOTE) The Xpert Xpress SARS-CoV-2/FLU/RSV assay is intended as an aid in  the diagnosis of influenza from Nasopharyngeal swab specimens and  should not be used as a sole basis for treatment. Nasal washings and  aspirates are unacceptable for Xpert Xpress SARS-CoV-2/FLU/RSV  testing.  Fact Sheet for Patients: PinkCheek.be  Fact Sheet for Healthcare Providers: GravelBags.it  This test is not yet approved or cleared by the [place] FDA and  has been authorized for detection and/or diagnosis of SARS-CoV-2 by  FDA under an Emergency Use Authorization (EUA). This EUA will remain  in effect (meaning this test can be used) for the duration of the  Covid-19 declaration under Section 564(b)(1) of the Act, 21  U.S.C. section 360bbb-3(b)(1), unless the authorization is  terminated or revoked. Performed at [facility], [place] [address], [place], [place] [address]       Radiology Studies: CT HEAD WO CONTRAST  Result Date: 06/30/2020 CLINICAL DATA:  Headache, new or worsening. EXAM: CT HEAD WITHOUT CONTRAST TECHNIQUE: Contiguous axial images were obtained from the base of the skull through the vertex without intravenous contrast. COMPARISON:  Prior head CT 02/01/2017. FINDINGS: Brain: Moderate generalized cerebral atrophy. Redemonstrated chronic cortically based infarct within the anterolateral left frontal lobe. Redemonstrated chronic lacunar infarcts within the left basal ganglia and anterior limb of the left internal capsule (series 3, image 17). A  chronic lacunar infarct within the genu of the right internal capsule is new as compared to the prior head CT of 02/01/2017. Background mild ill-defined hypoattenuation within the cerebral white matter which is nonspecific, but compatible with chronic small vessel ischemic disease. There is no acute intracranial hemorrhage. No acute demarcated cortical infarct is identified. No extra-axial fluid collection. No evidence of intracranial mass. No midline shift. Vascular: No hyperdense vessel.  Atherosclerotic calcifications Skull: Normal. Negative for fracture or focal lesion. Sinuses/Orbits: Visualized orbits show no acute finding. Mild ethmoid sinus mucosal thickening. Left mastoid effusion IMPRESSION: No evidence of acute intracranial abnormality. Redemonstrated chronic left frontal  lobe cortically based infarct. Redemonstrated chronic lacunar infarcts within the left basal ganglia and anterior limb of left internal capsule. A chronic lacunar infarct within the genu of the right internal capsule is new as compared to the prior head CT of 02/01/2017. Stable background moderate cerebral atrophy and mild chronic small vessel ischemic disease. Mild ethmoid sinus mucosal thickening. Left mastoid effusion. Electronically Signed   By: Kellie Simmering DO   On: 06/30/2020 11:50   DG CHEST PORT 1 VIEW  Result Date: 07/01/2020 CLINICAL DATA:  Apnea. EXAM: PORTABLE CHEST 1 VIEW COMPARISON:  06/30/2020 FINDINGS: Similar enlarged cardiac silhouette. Calcific atherosclerosis of the aorta similar position of a dual lead left subclavian approach cardiac rhythm maintenance device. Prior CABG with median sternotomy. Low lung volumes. No visible pleural effusions or pneumothorax on this limited AP portable semi erect. IMPRESSION: Low lung volumes without evidence of acute cardiopulmonary disease. Electronically Signed   By: Margaretha Sheffield MD   On: 07/01/2020 08:08     Scheduled Meds: . apixaban  5 mg Oral BID  . carvedilol   3.125 mg Oral BID WC  . cholestyramine  4 g Oral Q1400  . clopidogrel  75 mg Oral Daily  . dapagliflozin propanediol  10 mg Oral Daily  . diclofenac Sodium  2 g Topical QID  . FLUoxetine  40 mg Oral QHS  . Gerhardt's butt cream   Topical TID  . hydrALAZINE  12.5 mg Oral Q8H  . insulin aspart  0-9 Units Subcutaneous Q4H  . insulin aspart  10 Units Subcutaneous TID WC  . insulin detemir  11 Units Subcutaneous BID  . isosorbide mononitrate  60 mg Oral Daily  . rosuvastatin  20 mg Oral QHS  . sodium chloride flush  3 mL Intravenous Q12H   Continuous Infusions: . sodium chloride    . sodium chloride 50 mL/hr at 07/02/20 0454     LOS: 13 days   Time Spent in minutes   45 minutes  Shakiyah Cirilo D.O. on 07/02/2020 at 8:48 AM  Between 7am to 7pm - Please see pager noted on [email or web address]  After 7pm go to [email or web address]  And look for the night coverage person covering for me after hours  [facility] Office  [phone]

## 2020-07-02 NOTE — Progress Notes (Addendum)
  Speech Language Pathology Treatment: Dysphagia  Patient Details Name: Vernon Briggs MRN: [id number] DOB: June 22, 1946 Today's Date: 07/02/2020 Time: 7482-7078 SLP Time Calculation (min) (ACUTE ONLY): 18 min  Assessment / Plan / Recommendation Clinical Impression  Pt was seen for dysphagia treatment with his wife present. He was lethargic upon SLP's entry and this was improved slightly with verbal and tactile stimulation. The impact of his sub-optimal level of alertness on his performance is considered. Pt continues to demonstrate reduced bolus awareness and required verbal and tactile cues to initiate a swallow. Bolus manipulation was limited with the ice chip and absent with puree boluses despite verbal and tactile stimulation and cueing. A liquid bolus was attempted to assist with oral transit; however, pt required cueing for sucking and the liquid and puree were ultimately released to the straw. Additional boluses were deferred. Pt does not present as a candidate for safe oral intake at this time and it therefore recommended that his NPO status be maintained. Short-term non-oral nutrition (e.g., Cortrak) is recommended at this time. RN, Dr. Ree Kida, and pt's wife have been advised of the pt's performance and recommendations. SLP will continue to follow pt.    HPI HPI: 74 y.o. male with medical history significant of DM2, H/O stroke, PAF, OSA, HTN, CAD with chronic HFrEF, GERD, anxiety, HLD, HTN who presented with worsening SOB and swelling.  Per patient and his wife, Vernon Briggs was recently in the hospital in September for renal failure and hyperkalemia.  He had some mild SOB at that time. Due to the issues with his kidneys, he had his lisinopril, spironolactone stopped and his torsemide halved to 20mg  daily.  His wife monitors his medications and can attest to these changes.  Since that time he has been weaker at home and has been slowly getting worse over the last week and a half. Patient  admitted with respiratory failure, CHF exacerbation.  Now complaining of headache, CT head obtained and unremarkable for acute findings.  Overnight, patient became, patient placed on nonrebreather and on supplemental oxygen.  ABG unremarkable, chest x-ray obtained with no acute infection.        SLP Plan  Continue with current plan of care       Recommendations  Diet recommendations: NPO Medication Administration: Via alternative means                Oral Care Recommendations: Oral care QID;Patient independent with oral care Follow up Recommendations:  (TBD) SLP Visit Diagnosis: Dysphagia, unspecified (R13.10) Plan: Continue with current plan of care       Vernon Briggs I. Hardin Negus, [place], [place] Office number [phone] Pager [place] 07/02/2020, 1:12 PM

## 2020-07-02 NOTE — Progress Notes (Signed)
PT Cancellation Note  Patient Details Name: Vernon Briggs MRN: [id number] DOB: [birth year]   Cancelled Treatment:    Reason Eval/Treat Not Completed: Medical issues which prohibited therapy.  Pt is lethargic and unable to do therapy today. Low PO intake.  Follow up as time and pt allow.   Ramond Dial 07/02/2020, 3:39 PM   Mee Hives, PT MS Acute Rehab Dept. Number: [place] and [place]

## 2020-07-02 NOTE — Progress Notes (Signed)
Speech came to reassess patient and reccommends Cortrak to begin nutrition; consult placed.

## 2020-07-02 NOTE — Procedures (Signed)
Cortrak  Person Inserting Tube:  Jeimy Bickert, RD Tube Type:  Cortrak - 43 inches Tube Location:  Right nare Initial Placement:  Stomach Secured by: Bridle Technique Used to Measure Tube Placement:  Documented cm marking at nare/ corner of mouth Cortrak Secured At:  68 cm    No x-ray is required. RN may begin using tube.   If the tube becomes dislodged please keep the tube and contact the Cortrak team at [email or web address] ([email or web address]) for replacement.  If after hours and replacement cannot be delayed, place a NG tube and confirm placement with an abdominal x-ray.    Cartina Brousseau RD, LDN Clinical Nutrition Pager listed in AMION   

## 2020-07-02 NOTE — Progress Notes (Signed)
Pharmacy Antibiotic Note  Vernon Briggs is a 74 y.o. male admitted  with UTI/aspiration pneumonia. Pharmacy has been consulted for cefepime dosing. He is noted with AKI -WBC= 16.2, afebrile -SCr= 4.42, CrCl ~ 20  Plan: -Cefepime 2gm IV q24h -Will follow renal function, cultures and clinical progress    Weight: 117.9 kg (259 lb 14.8 oz)  Temp (24hrs), Avg:98.9 F (37.2 C), Min:98.2 F (36.8 C), Max:99.6 F (37.6 C)  Recent Labs  Lab 06/26/20 0444 06/26/20 1047 06/27/20 0452 06/28/20 1102 07/01/20 0257 07/02/20 0411 07/02/20 0730  WBC  --  5.0 6.0 10.8*  --   --  16.2*  CREATININE 1.71*  --  1.60* 1.92* 2.46* 4.42*  --     Estimated Creatinine Clearance: 18.8 mL/min (A) (by C-G formula based on SCr of 4.42 mg/dL (H)).    Allergies  Allergen Reactions  . Tape Other (See Comments)    Causes skin irritation- use sensitive tape ONLY!!    Antimicrobials this admission: 10/29 unasyn>> 10/29 10/29 cefepime>> 10/29 flagyl>>   Dose adjustments this admission:   Microbiology results: 10/29 urine  Thank you for allowing pharmacy to be a part of this patient's care.  Hildred Laser, PharmD Clinical Pharmacist **Pharmacist phone directory can now be found on [email or web address] ([email or web address]).  Listed under [place].

## 2020-07-02 NOTE — Progress Notes (Addendum)
Nutrition Follow-up  DOCUMENTATION CODES:   Obesity unspecified  INTERVENTION:   Once feeding access is established:  -Initiate Osmolite 1.5 @ 20 ml/hr via cortrak tube and increase by 10 ml every 8 hours to goal rate of 60 ml/hr.   45 ml Prosource TF BID    Tube feeding regimen provides 2280 kcal (100% of needs), 123 grams of protein, and 1097 ml of H2O.   -Monitor Mg, K, and Phos daily and replete as needed due to high refeeding risk  NUTRITION DIAGNOSIS:   Inadequate oral intake related to inability to eat as evidenced by NPO status.  Ongoing  GOAL:   Patient will meet greater than or equal to 90% of their needs  Progressing   MONITOR:   Diet advancement, Labs, Weight trends, Skin, I & O's  REASON FOR ASSESSMENT:   Rounds    ASSESSMENT:   Vernon Briggs is a 74 y.o. male with medical history significant of DM2, H/O stroke, PAF, OSA, HTN, CAD with chronic HFrEF, GERD, anxiety, HLD, HTN who presented with worsening SOB and swelling.  10/28- s/p BSE- recommend NPO 10/29- s/p BSE- recommend NPO and cortrak tube  Reviewed I/O's: +1 L x 24 hours and -6.1 L since admission  Pt very lethargic at time of visit. History obtained form pt's wife Vernon Briggs) at bedside, who was very tearful at time of visit. She reports pt has experienced a general decline in health over the past 2 days, secondary to mental status changes. Pt has had decreased appetite over the past 3 weeks- often eating only bites and sips with meals (wife reports that PTA she would provide pt with his favorite foods, but would only eat 2-3 bites of them).   Reviewed wt hx; pt has experienced a 12.7% wt loss over the past 2 months, which is significant for time frame. Pt with moderate edema on exam. Suspect edema may be masking further weight loss as well as fat and muscle depletion.   Discussed with pt wife plan for cortrak tube placement and TF for nutrition. Explained how cortrak is placed and how pt  will receive nutrition at this time. Also discussed plan of care with RN.   Palliative care continues to follow for goals of care discussions.   Medications reviewed and include 0.9% sodium chloride infusion @ 125 ml/hr.   Labs reviewed: CBGS: 247 (inpatient orders for glycemic control are 0-9 units insulin aspart every 4 hours, 6 units insulin aspart every 4 hours, and 18 units insulin determir BID).   NUTRITION - FOCUSED PHYSICAL EXAM:    Most Recent Value  Orbital Region No depletion  Upper Arm Region No depletion  Thoracic and Lumbar Region No depletion  Buccal Region No depletion  Temple Region No depletion  Clavicle Bone Region No depletion  Clavicle and Acromion Bone Region No depletion  Scapular Bone Region No depletion  Dorsal Hand No depletion  Patellar Region No depletion  Anterior Thigh Region No depletion  Posterior Calf Region No depletion  Edema (RD Assessment) Moderate  Hair Reviewed  Eyes Reviewed  Mouth Reviewed  Skin Reviewed  Nails Reviewed       Diet Order:   Diet Order            Diet NPO time specified  Diet effective now           Diet - low sodium heart healthy                 EDUCATION NEEDS:  No education needs have been identified at this time  Skin:  Skin Assessment: Skin Integrity Issues: Skin Integrity Issues:: Other (Comment) Other: rt elbow IV site with phlebitis  Last BM:  06/30/20  Height:   Ht Readings from Last 1 Encounters:  05/05/20 6' (1.829 m)    Weight:   Wt Readings from Last 1 Encounters:  07/01/20 117.9 kg    Ideal Body Weight:  80.9 kg  BMI:  Body mass index is 35.25 kg/m.  Estimated Nutritional Needs:   Kcal:  2200-2400  Protein:  120-135 grams  Fluid:  2 L    Loistine Chance, RD, LDN, [place] Registered Dietitian II Certified Diabetes Care and Education Specialist Please refer to [facility] for RD and/or RD on-call/weekend/after hours pager

## 2020-07-02 NOTE — Consult Note (Signed)
Consultation Note Date: 07/02/2020   Patient Name: Vernon Briggs  DOB: [birth year]  MRN: [id number]  Age / Sex: 74 y.o., male  PCP: [name], MD Referring Physician: Cristal Ford, DO  Reason for Consultation: Establishing goals of care  HPI/Patient Profile: 74 y.o. male  with past medical history of mixed heart failure with an EF of 25 to 30%, CAD status post CABG x4 in 1999, ICD placement, paroxysmal atrial fibrillation, CVA, obstructive sleep apnea, anxiety, depression, PTSD who was admitted on 06/23/2020 with body wide volume overload from heart failure.  Of note the patient had a recent hospital admission from 9/1-9/6 during which she had acute renal failure.  His diuretics were decreased.  This hospitalization has been complicated by acute renal failure.  His creatinine today is 4.42 (10/29) as well as delirium.  The medical team is concerned that Vernon Briggs may be nearing end-of-life.  Clinical Assessment and Goals of Care:  I have reviewed medical records including EPIC notes, labs and imaging, received report from [facility] attending physician, bedside RN, examined the patient and met at bedside with his wife Vernon Briggs to discuss diagnosis prognosis, [place], EOL wishes, disposition and options.  I introduced Palliative Medicine as specialized medical care for people living with serious illness. It focuses on providing relief from the symptoms and stress of a serious illness.   We discussed a brief life review of the patient.  Mr. Kluver is originally from [place].  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  He had 1 daughter who unfortunately was born with hydrocephalus.  Her prognosis was less than 1 year but she lived to be 76 years old and Mr. Dorton took her to her  high school senior Prom.  This is a second marriage for both he and Vernon Briggs.  They have been together 63 years but married 10 years.  Each of them has a daughter who has passed away.  Vernon Briggs lost her daughter in 2013 to a brain aneurysm.  She died here at [facility].  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  He loves the water.  She also says that they only have each other.  She has stage IV metastatic breast cancer and was incredibly worried about what would happen to him after she died.  Vernon Briggs is bewildered to find herself in this position of potentially caring for him at end of life.  Vernon Briggs has been told by the nurse that Vernon Briggs) may have only days left to live.  I explained to her that his rapid shallow breathing worries me.  With his renal failure I am worried that acid is building up in his system causing him to breathe rapidly. I expressed that his time may be short.  Vernon Briggs understands and is tearful.  She is afraid to leave him.  We discussed his current illness and what it means in the larger context of his on-going co-morbidities.  Natural disease trajectory and expectations at EOL were discussed.  Vernon Briggs has been thru a lot in the past 3 years but has not ever had delirium in the past.  Vernon Briggs understands that what is different this time is that his kidneys are failing.   Between his advanced heart failure and the kidney failure his body is unable to process fluid.  I fear that his delirium may be a combination of uremia and terminal delirium.  Vernon Briggs is a DNR / DNI.  Vernon Briggs would like to bring her son in law Charletta Cousin to the hospital tomorrow to discuss Vernon Briggs's condition and end of life care.  We tentatively plan to meet at 12:00 pm.  The next day or two will determine whether or not Vernon Briggs will pass away with [place] here in the hospital or if he may be able to be discharged and pass at [facility] or possibly  at home.  Questions and concerns were addressed.  The family was encouraged to call with questions or concerns.     Primary Decision Maker:  NEXT OF KIN, Wife Linda    SUMMARY OF RECOMMENDATIONS    Code Status/Advance Care Planning:  DNR   Symptom Management:   Added very low dose haldol for agitation  Additional Recommendations (Limitations, Scope, Preferences):  Full Scope Treatment  Palliative Prophylaxis:   Delirium Protocol  Psycho-social/Spiritual:   Desire for further Chaplaincy support: politely declined.  Prognosis: days to perhaps weeks.  Heart disease, lung disease, kidney failure, and delirium.  We will look for improvement in kidney function in the next couple of days but if there is no improvement I feel he is near end of life.    Discharge Planning: To Be Determined      Primary Diagnoses: Present on Admission: . Acute on chronic systolic CHF (congestive heart failure) ([place]) . Automatic implantable cardioverter-defibrillator in situ . Type II diabetes mellitus with complication ([place]) . Hyperlipidemia with target LDL less than 70 . Essential hypertension . Cardiomyopathy, ischemic . PAF (paroxysmal atrial fibrillation) ([place]) . CKD (chronic kidney disease) stage 3, GFR 30-59 ml/min (HCC) . Acute hypoxemic respiratory failure ([place])   I have reviewed the medical record, interviewed the patient and family, and examined the patient. The following aspects are pertinent.  Past Medical History:  Diagnosis Date  . AICD (automatic cardioverter/defibrillator) present   . Anxiety   . Arthritis    "occasionally in my right hand" (04/02/2015)  . CAD in native artery 1999  . Cancer ([place])   . CHF (congestive heart failure) ([place])   . Depression   . GERD (gastroesophageal reflux disease)    "occasionally" (04/02/2015)  . History of hiatal hernia   . Hypercholesterolemia   . Hypertension   . Myocardial infarction ([place]) "several"  . OSA on CPAP   . PAF  (paroxysmal atrial fibrillation) ([place])   . PTSD (post-traumatic stress disorder)   . Stroke ([place])    "I've had 2; my right leg & arm slightly weaker since" (04/02/2015)  . Type II diabetes mellitus ([place])    Social History   Socioeconomic History  . Marital status: Married    Spouse name: Not on file  . Number of children: Not on file  . Years of education: Not on file  . Highest education level: Not on file  Occupational History  .  Not on file  Tobacco Use  . Smoking status: Former Smoker    Packs/day: 0.50    Years: 1.00    Pack years: 0.50    Types: Cigarettes  . Smokeless tobacco: Never Used  . Tobacco comment: "quit smoking cigarettes in the 1960's"  Substance and Sexual Activity  . Alcohol use: Yes    Comment: 04/02/2015 "might have a beer and a mixed drink/month"  . Drug use: No  . Sexual activity: Yes  Other Topics Concern  . Not on file  Social History Narrative  . Not on file   Social Determinants of Health   Financial Resource Strain:   . Difficulty of Paying Living Expenses: Not on file  Food Insecurity: No Food Insecurity  . Worried About Charity fundraiser in the Last Year: Never true  . Ran Out of Food in the Last Year: Never true  Transportation Needs:   . Lack of Transportation (Medical): Not on file  . Lack of Transportation (Non-Medical): Not on file  Physical Activity:   . Days of Exercise per Week: Not on file  . Minutes of Exercise per Session: Not on file  Stress:   . Feeling of Stress : Not on file  Social Connections:   . Frequency of Communication with Friends and Family: Not on file  . Frequency of Social Gatherings with Friends and Family: Not on file  . Attends Religious Services: Not on file  . Active Member of Clubs or Organizations: Not on file  . Attends Archivist Meetings: Not on file  . Marital Status: Not on file   Family History  Problem Relation Age of Onset  . Diabetes Mellitus II Mother   . Heart disease  Father     Allergies  Allergen Reactions  . Tape Other (See Comments)    Causes skin irritation- use sensitive tape ONLY!!      Vital Signs: BP (!) 112/52 (BP Location: Left Arm)   Pulse 78   Temp 98.4 F (36.9 C) (Oral)   Resp 18   Wt 117.9 kg   SpO2 93%   BMI 35.25 kg/m  Pain Scale: 0-10 POSS *See Group Information*: 1-Acceptable,Awake and alert Pain Score: 0-No pain   SpO2: SpO2: 93 % O2 Device:SpO2: 93 % O2 Flow Rate: .O2 Flow Rate (L/min): 4 L/min    Palliative Assessment/Data: 20%     Time In: 2:00 Time Out: 3:00 Time Total: 60 min. Visit consisted of counseling and education dealing with the complex and emotionally intense issues surrounding the need for palliative care and symptom management in the setting of serious and potentially life-threatening illness. Greater than 50%  of this time was spent counseling and coordinating care related to the above assessment and plan.  Signed by: [name], PA-C Palliative Medicine  Please contact Palliative Medicine Team phone at [phone] for questions and concerns.  For individual provider: See Shea Evans

## 2020-07-02 NOTE — Progress Notes (Addendum)
Inpatient Diabetes Program Recommendations  AACE/ADA: New Consensus Statement on Inpatient Glycemic Control (2015)  Target Ranges:  Prepandial:   less than 140 mg/dL      Peak postprandial:   less than 180 mg/dL (1-2 hours)      Critically ill patients:  140 - 180 mg/dL   Lab Results  Component Value Date   GLUCAP 282 (H) 07/02/2020   HGBA1C 7.0 (H) 05/06/2020    Review of Glycemic Control Results for Vernon Briggs, Vernon Briggs (MRN [id number]) as of 07/02/2020 08:54  Ref. Range 07/01/2020 17:14 07/01/2020 21:55 07/02/2020 00:30 07/02/2020 04:06 07/02/2020 06:20  Glucose-Capillary Latest Ref Range: 70 - 99 mg/dL 253 (H) 282 (H) 281 (H) 287 (H) 282 (H)   Diabetes history: Type 2 DM Outpatient Diabetes medications: U-500 55-110 units BID Current orders for Inpatient glycemic control: Novolog 0-15 units Q4H, Farxiga 10 mg QD, Novolog 10 units TID, Levemir 11 units BID  Inpatient Diabetes Program Recommendations:    Consider -Discontinuing Novolog 10 units TID (Pt now NPO) -Increase Levemir to 18 units BID. -If plan is to add tube feeds, also add Novolog 6 units Q4H (to be stopped in the event tube feeds are stopped or held).  Thanks, Bronson Curb, MSN, RNC-OB Diabetes Coordinator [phone] (8a-5p)

## 2020-07-03 LAB — GLUCOSE, CAPILLARY
Glucose-Capillary: 299 mg/dL — ABNORMAL HIGH (ref 70–99)
Glucose-Capillary: 344 mg/dL — ABNORMAL HIGH (ref 70–99)

## 2020-07-04 LAB — URINE CULTURE: Culture: 80000 — AB

## 2020-07-05 NOTE — Plan of Care (Signed)
   Problem: Clinical Measurements: Goal: Ability to maintain clinical measurements within normal limits will improve Outcome: Not Progressing   Problem: Clinical Measurements: Goal: Respiratory complications will improve Outcome: Not Progressing   Problem: Activity: Goal: Risk for activity intolerance will decrease Outcome: Not Progressing

## 2020-07-05 NOTE — Progress Notes (Signed)
Patients O2 sats dropped to 88, RN increased O2 from4L to 6L and placed on HFNC. O2 sats 91%

## 2020-07-05 NOTE — Death Summary Note (Signed)
DEATH SUMMARY   Patient Details  Name: Vernon Briggs MRN: [id number] DOB: 13-May-1946  Admission/Discharge Information   Admit Date:  06/26/2020  Date of Death: Date of Death: Jul 10, 2020  Time of Death: Time of Death: 0530  Length of Stay: Mar 76, 2024  Referring Physician: [name], MD   Reason(s) for Hospitalization  Acute hypoxic respiratory failure secondary to systolic with hypoxia CHF exacerbation  Diagnoses  Preliminary cause of death: Acute respiratory failure with hypoxia Secondary Diagnoses (including complications and co-morbidities):  Acute respiratory failure with hypoxia secondary acute on chronic systolic heart failure exacerbation Acute metabolic encephalopathy Acute kidney injury on chronic kidney disease, stage IIIb Elevated troponin Diabetes mellitus, type II Paroxysmal atrial fibrillation Essential hypertension Obstructive sleep apnea Fever Headache Physical deconditioning Goals of care Poor oral intake/possible dysphagia and aspiration Prolonged QT  Brief Hospital Course (including significant findings, care, treatment, and services provided and events leading to death)  HPI on 2020-06-26 by Dr. Cletis Athens Hadgkissis a 74 y.o.malewith medical history significant ofDM2, H/O stroke, PAF, OSA, HTN, CAD with chronic HFrEF, GERD, anxiety, HLD, HTN who presented with worsening SOB and swelling. Per patient and his wife, Vernon Briggs was recently in the hospital in September for renal failure and hyperkalemia. He had some mild SOB at that time. Due to the issues with his kidneys, he had his lisinopril, spironolactone stopped and his torsemide halved to 20mg  daily. His wife monitors his medications and can attest to these changes. Since that time he has been weaker at home and has been slowly getting worse over the last week and a half. Specifically he has been having worsening SOB, worsening lower extremity edema, decreased PO intake, worsening  weakness and increasing blood pressure. She takes his blood pressure daily and noted an increase to the 160s/70s at home. He further has been having worsening orthopnea and increased PND. He tried to use his home CPAP today to help and this did not improve his symptoms so they called EMS. He was placed on oxygen and feels better. He further has had a mild cough in the hospital and wheezing. He denies chest pain, fever, chills, nausea, change in vision, headache, abdominal pain, recent falls. He has had generalized weakness, chronic diarrhea (unchanged) and swelling.   Interim history Patient admitted with respiratory failure, CHF exacerbation.  Now complaining of headache, CT head obtained and unremarkable for acute findings.  Overnight, patient became, patient placed on nonrebreather and on supplemental oxygen.  ABG unremarkable, chest x-ray obtained with no acute infection.  Patient unfortunately developed acute kidney injury, nephrology was consulted.  Patient was started on IV fluids.  Work-up was pending.  Palliative care was also consulted as patient continued to decompensate.  Patient was already a DNR.  Patient unfortunately expired on 10-Jul-2020 at 5:30 AM.  Assessment and plan Acute respiratory failure with hypoxia secondary acute on chronic systolic heart failure exacerbation -Thought to be secondary to noncompliance with medications -Patient required oxygen upon admission -He had presented with generalized weakness as well as swelling and shortness of breath -Echocardiogram: EF 21-30%, RV systolic function mildly reduced -Patient was placed on IV Lasix 80 mg -Cardiology consulted and appreciated -He has been transitioned to oral torsemide-which has been held due to AKI -Patient was weaned to room air however overnight became dyspneic and somewhat apneic.  He was placed on nonrebreather.    Patient was placed on 4 L of oxygen  Acute metabolic encephalopathy -Patient with  confusion.  Whether this is due to  acute kidney injury versus underlying infection. -CT head obtained on 06/30/2020 showed no acute findings, old lacunar strokes -Unable to obtain MRI due to AICD placement -Chest x-ray unremarkable from -UA and culture pending -Patient with leukocytosis, 16 -Started empirically on Unasyn for possible aspiration - however after speaking with pharmacy, changed to cefepime and flagyl -TSH, folate, and vitamin B12 within normal limits -Vitamin B1 pending  Acute kidney injury on chronic kidney disease, stage IIIb -Suspect secondary to poor oral intake along with medications -Torsemide currently held -Despite IVF (though gentle given CHF), creatinine worsening and up to 4.42 (baseline 1.5-1.7) -Was placed on IV fluids. -Renal ultrasound obtained and was negative for hydronephrosis.  Concern for solid mass at the lower pole left kidney. -Nephrology was consulted and appreciated, discussed with Dr. Moshe Cipro, agreed with IV fluids, rate was increased -UA on 07/02/2020 showed rare bacteria, >50 WBC, large leukocytes (patient has been started on antibiotics) -Urine culture pending  Elevated troponin -Suspect demand ischemia secondary to the above -Was continued on Plavix, Eliquis, Coreg, Imdur, Crestor  Diabetes mellitus, type II -Continue Lantus, dose modified along with novolog q4hrs scheduled and ISS with CVB monitoring -Farxiga discontinued   Paroxysmal atrial fibrillation -Was placed on Eliquis, Coreg  Essential hypertension  Obstructive sleep apnea -Continue CPAP- however declined to use it -Patient was noted to be apneic and was placed on nonrebreather and then transition to supplemental oxygen  Fever -Patient had fever approximately 1 week ago -UA and CXR were unremarkable for infection -Thought to be secondary to thrombophlebitis and chronic diarrhea -Patient has been afebrile for more than 72 hours  Headache -Per wife, patient  has had a headache for 76 days -Patient tells me his headache starts in the back of his head.  Denies any previous head injury, nausea or vomiting, sensitivity. -Suspect tension type headache -Given his history of CVA, CT head obtained and was unremarkable for acute findings  Physical deconditioning -PT and OT recommending SNF -Patient has been unable to work with PT and OT given his pain and headache -SNF was declined by insurance  Goals of care -Patient currently DNR however continues to have minimal improvement -Currently not eating or drinking much -Have asked for nursing to document amount of intake -Discussed hospice care with patient's wife as well as palliative care consult -Palliative care consulted and appreciated  Poor oral intake/possible dysphagia and aspiration -Speech therapy consulted along with nutrition -Core track was placed and patient was started on PEG feeds  Prolonged QT -EKG obtained overnight as patient was not feeling well and stated he had pain -Remarkable for ACS finding  Right upper extremity pain -RUE Doppler was ordered  Pertinent Labs and Studies  Significant Diagnostic Studies DG Chest 2 View  Result Date: 06/04/2020 CLINICAL DATA:  Shortness of breath EXAM: CHEST - 2 VIEW COMPARISON:  05/05/2020 FINDINGS: Cardiomegaly. Left pacing device remains in place unchanged. Prior CABG. Mild vascular congestion. Right base atelectasis. Small left pleural effusion. IMPRESSION: Cardiomegaly, vascular congestion. Small left effusion. Right base atelectasis. Electronically Signed   By: Rolm Baptise M.D.   On: 06/29/2020 17:45   CT HEAD WO CONTRAST  Result Date: 06/30/2020 CLINICAL DATA:  Headache, new or worsening. EXAM: CT HEAD WITHOUT CONTRAST TECHNIQUE: Contiguous axial images were obtained from the base of the skull through the vertex without intravenous contrast. COMPARISON:  Prior head CT 02/01/2017. FINDINGS: Brain: Moderate generalized cerebral  atrophy. Redemonstrated chronic cortically based infarct within the anterolateral left frontal lobe. Redemonstrated chronic lacunar infarcts  within the left basal ganglia and anterior limb of the left internal capsule (series 3, image 17). A chronic lacunar infarct within the genu of the right internal capsule is new as compared to the prior head CT of 02/01/2017. Background mild ill-defined hypoattenuation within the cerebral white matter which is nonspecific, but compatible with chronic small vessel ischemic disease. There is no acute intracranial hemorrhage. No acute demarcated cortical infarct is identified. No extra-axial fluid collection. No evidence of intracranial mass. No midline shift. Vascular: No hyperdense vessel.  Atherosclerotic calcifications Skull: Normal. Negative for fracture or focal lesion. Sinuses/Orbits: Visualized orbits show no acute finding. Mild ethmoid sinus mucosal thickening. Left mastoid effusion IMPRESSION: No evidence of acute intracranial abnormality. Redemonstrated chronic left frontal lobe cortically based infarct. Redemonstrated chronic lacunar infarcts within the left basal ganglia and anterior limb of left internal capsule. A chronic lacunar infarct within the genu of the right internal capsule is new as compared to the prior head CT of 02/01/2017. Stable background moderate cerebral atrophy and mild chronic small vessel ischemic disease. Mild ethmoid sinus mucosal thickening. Left mastoid effusion. Electronically Signed   By: Kellie Simmering DO   On: 06/30/2020 11:50   US RENAL  Result Date: 07/02/2020 CLINICAL DATA:  Acute kidney injury EXAM: RENAL / URINARY TRACT ULTRASOUND COMPLETE COMPARISON:  05/05/2020 FINDINGS: Right Kidney: Renal measurements: 15 x 7 x 7.4 cm = volume: 435.3 mL. Cortical echogenicity is normal. No hydronephrosis. Parapelvic upper pole cyst measuring 3.1 x 2.6 x 2.6 cm. Septated lower pole cyst measuring 5.1 x 3.2 x 5.1 cm. Solid appearing mass lower  pole left kidney measuring 6 x 3.5 x 5.8 cm. Left Kidney: Renal measurements: 14.2 x 6.9 x 7 cm = volume: 358.3 mL. Echogenicity within normal limits. No mass or hydronephrosis visualized. Limited visualization due to bowel gas and habitus. Bladder: Appears normal for degree of bladder distention. Other: None. IMPRESSION: 1. Negative for hydronephrosis. Multiple right renal cysts including 5.1 cm septated cyst in the right kidney. 2. Concern for solid mass at the lower pole left kidney. Nonemergent renal CT or MRI evaluation is suggested when clinically feasible. Electronically Signed   By: Donavan Foil M.D.   On: 07/02/2020 18:41   DG CHEST PORT 1 VIEW  Result Date: 07/01/2020 CLINICAL DATA:  Apnea. EXAM: PORTABLE CHEST 1 VIEW COMPARISON:  06/29/2020 FINDINGS: Similar enlarged cardiac silhouette. Calcific atherosclerosis of the aorta similar position of a dual lead left subclavian approach cardiac rhythm maintenance device. Prior CABG with median sternotomy. Low lung volumes. No visible pleural effusions or pneumothorax on this limited AP portable semi erect. IMPRESSION: Low lung volumes without evidence of acute cardiopulmonary disease. Electronically Signed   By: Margaretha Sheffield MD   On: 07/01/2020 08:08   ECHOCARDIOGRAM COMPLETE  Result Date: 06/20/2020    ECHOCARDIOGRAM REPORT   Patient Name:   JACHIN COURY Date of Exam: 06/20/2020 Medical Rec #:  [id number]       Height:       72.0 in Accession #:    [id number]      Weight:       281.7 lb Date of Birth:  [birth year]       BSA:          2.465 m Patient Age:    66 years        BP:           159/79 mmHg Patient Gender: M  HR:           73 bpm. Exam Location:  Inpatient Procedure: 2D Echo, Cardiac Doppler, Color Doppler and Intracardiac            Opacification Agent Indications:    I50.23 Acute on chronic systolic (congestive) heart failure  History:        Patient has prior history of Echocardiogram examinations.                  Previous Myocardial Infarction and CAD, Defibrillator, Stroke,                 Arrythmias:Atrial Fibrillation; Risk Factors:Hypertension,                 Diabetes, Dyslipidemia, Sleep Apnea and GERD.  Sonographer:    Jonelle Sidle Dance Referring Phys: 14 EMILY B MULLEN  Sonographer Comments: Technically difficult study due to poor echo windows. IMPRESSIONS  1. Left ventricular ejection fraction, by estimation, is 25 to 30%. The left ventricle has severely decreased function. The left ventricle demonstrates regional wall motion abnormalities (see scoring diagram/findings for description). The left ventricular internal cavity size was mildly dilated. Left ventricular diastolic parameters are indeterminate.  2. Right ventricular systolic function is mildly reduced. The right ventricular size is mildly enlarged. There is moderately elevated pulmonary artery systolic pressure. The estimated right ventricular systolic pressure is 72.5 mmHg.  3. Left atrial size was mildly dilated.  4. Right atrial size was moderately dilated.  5. The mitral valve is grossly normal. Moderate mitral valve regurgitation. No evidence of mitral stenosis.  6. Tricuspid valve regurgitation is moderate to severe.  7. The aortic valve is calcified. There is mild calcification of the aortic valve. Aortic valve regurgitation is mild. No aortic stenosis is present.  8. Aortic dilatation noted. There is mild dilatation of the ascending aorta, measuring 42 mm.  9. The inferior vena cava is dilated in size with <50% respiratory variability, suggesting right atrial pressure of 15 mmHg. FINDINGS  Left Ventricle: Left ventricular ejection fraction, by estimation, is 25 to 30%. The left ventricle has severely decreased function. The left ventricle demonstrates regional wall motion abnormalities. Definity contrast agent was given IV to delineate the left ventricular endocardial borders. The left ventricular internal cavity size was mildly dilated. There is no  left ventricular hypertrophy. Left ventricular diastolic parameters are indeterminate.  LV Wall Scoring: The mid and distal anterior septum, mid inferoseptal segment, and apical anterior segment are akinetic. Right Ventricle: The right ventricular size is mildly enlarged. No increase in right ventricular wall thickness. Right ventricular systolic function is mildly reduced. There is moderately elevated pulmonary artery systolic pressure. The tricuspid regurgitant velocity is 2.87 m/s, and with an assumed right atrial pressure of 15 mmHg, the estimated right ventricular systolic pressure is 36.6 mmHg. Left Atrium: Left atrial size was mildly dilated. Right Atrium: Right atrial size was moderately dilated. Pericardium: There is no evidence of pericardial effusion. Mitral Valve: The mitral valve is grossly normal. Moderate mitral valve regurgitation. No evidence of mitral valve stenosis. Tricuspid Valve: The tricuspid valve is normal in structure. Tricuspid valve regurgitation is moderate to severe. No evidence of tricuspid stenosis. Aortic Valve: The aortic valve is calcified. There is mild calcification of the aortic valve. Aortic valve regurgitation is mild. No aortic stenosis is present. Pulmonic Valve: The pulmonic valve was not well visualized. Pulmonic valve regurgitation is mild to moderate. No evidence of pulmonic stenosis. Aorta: Aortic dilatation noted. There is mild dilatation of  the ascending aorta, measuring 42 mm. Venous: The inferior vena cava is dilated in size with less than 50% respiratory variability, suggesting right atrial pressure of 15 mmHg. IAS/Shunts: No atrial level shunt detected by color flow Doppler. Additional Comments: A pacer wire is visualized in the right atrium and right ventricle.  LEFT VENTRICLE PLAX 2D LVIDd:         5.88 cm  Diastology LVIDs:         4.58 cm  LV e' medial:    5.44 cm/s LV PW:         1.42 cm  LV E/e' medial:  16.3 LV IVS:        1.03 cm  LV e' lateral:   8.38  cm/s LVOT diam:     2.00 cm  LV E/e' lateral: 10.6 LV SV:         53 LV SV Index:   21 LVOT Area:     3.14 cm  RIGHT VENTRICLE            IVC RV Basal diam:  3.54 cm    IVC diam: 2.51 cm RV Mid diam:    2.75 cm RV S prime:     6.20 cm/s TAPSE (M-mode): 1.2 cm LEFT ATRIUM             Index       RIGHT ATRIUM           Index LA diam:        5.10 cm 2.07 cm/m  RA Area:     27.10 cm LA Vol (A2C):   95.5 ml 38.75 ml/m RA Volume:   94.30 ml  38.26 ml/m LA Vol (A4C):   67.7 ml 27.47 ml/m LA Biplane Vol: 82.3 ml 33.39 ml/m  AORTIC VALVE LVOT Vmax:   94.90 cm/s LVOT Vmean:  66.600 cm/s LVOT VTI:    0.168 m  AORTA Ao Root diam: 3.60 cm Ao Asc diam:  4.20 cm MITRAL VALVE               TRICUSPID VALVE MV Area (PHT): 2.81 cm    TR Peak grad:   32.9 mmHg MV Decel Time: 270 msec    TR Vmax:        287.00 cm/s MV E velocity: 88.80 cm/s                            SHUNTS                            Systemic VTI:  0.17 m                            Systemic Diam: 2.00 cm Cherlynn Kaiser MD Electronically signed by Cherlynn Kaiser MD Signature Date/Time: 06/20/2020/2:26:11 PM    Final     Microbiology Recent Results (from the past 240 hour(s))  Respiratory Panel by RT PCR (Flu A&B, Covid) - Nasopharyngeal Swab     Status: None   Collection Time: 06/29/20 12:52 PM   Specimen: Nasopharyngeal Swab  Result Value Ref Range Status   SARS Coronavirus 2 by RT PCR NEGATIVE NEGATIVE Final    Comment: (NOTE) SARS-CoV-2 target nucleic acids are NOT DETECTED.  The SARS-CoV-2 RNA is generally detectable in upper respiratoy specimens during the acute phase of infection. The lowest concentration of SARS-CoV-2 viral copies this  assay can detect is 131 copies/mL. A negative result does not preclude SARS-Cov-2 infection and should not be used as the sole basis for treatment or other patient management decisions. A negative result may occur with  improper specimen collection/handling, submission of specimen other than  nasopharyngeal swab, presence of viral mutation(s) within the areas targeted by this assay, and inadequate number of viral copies (<131 copies/mL). A negative result must be combined with clinical observations, patient history, and epidemiological information. The expected result is Negative.  Fact Sheet for Patients:  PinkCheek.be  Fact Sheet for Healthcare Providers:  GravelBags.it  This test is no t yet approved or cleared by the [place] FDA and  has been authorized for detection and/or diagnosis of SARS-CoV-2 by FDA under an Emergency Use Authorization (EUA). This EUA will remain  in effect (meaning this test can be used) for the duration of the COVID-19 declaration under Section 564(b)(1) of the Act, 21 U.S.C. section 360bbb-3(b)(1), unless the authorization is terminated or revoked sooner.     Influenza A by PCR NEGATIVE NEGATIVE Final   Influenza B by PCR NEGATIVE NEGATIVE Final    Comment: (NOTE) The Xpert Xpress SARS-CoV-2/FLU/RSV assay is intended as an aid in  the diagnosis of influenza from Nasopharyngeal swab specimens and  should not be used as a sole basis for treatment. Nasal washings and  aspirates are unacceptable for Xpert Xpress SARS-CoV-2/FLU/RSV  testing.  Fact Sheet for Patients: PinkCheek.be  Fact Sheet for Healthcare Providers: GravelBags.it  This test is not yet approved or cleared by the [place] FDA and  has been authorized for detection and/or diagnosis of SARS-CoV-2 by  FDA under an Emergency Use Authorization (EUA). This EUA will remain  in effect (meaning this test can be used) for the duration of the  Covid-19 declaration under Section 564(b)(1) of the Act, 21  U.S.C. section 360bbb-3(b)(1), unless the authorization is  terminated or revoked. Performed at [facility], [place] [address] [place],  [place] [address]   Culture, Urine     Status: Abnormal (Preliminary result)   Collection Time: 07/02/20  9:07 AM   Specimen: Urine, Clean Catch  Result Value Ref Range Status   Specimen Description URINE, CLEAN CATCH  Final   Special Requests NONE  Final   Culture (A)  Final    80,000 COLONIES/mL STAPHYLOCOCCUS AUREUS SUSCEPTIBILITIES TO FOLLOW Performed at [facility], [place] [address] [place], [place] [address]    Report Status PENDING  Incomplete  Culture, blood (routine x 2)     Status: None (Preliminary result)   Collection Time: 07/02/20  4:01 PM   Specimen: BLOOD LEFT HAND  Result Value Ref Range Status   Specimen Description BLOOD LEFT HAND  Final   Special Requests   Final    BOTTLES DRAWN AEROBIC AND ANAEROBIC Blood Culture adequate volume   Culture   Final    NO GROWTH < 24 HOURS Performed at [facility], [place] [address], [place], [place] [address]    Report Status PENDING  Incomplete  Culture, blood (routine x 2)     Status: None (Preliminary result)   Collection Time: 07/02/20  4:03 PM   Specimen: BLOOD RIGHT HAND  Result Value Ref Range Status   Specimen Description BLOOD RIGHT HAND  Final   Special Requests   Final    BOTTLES DRAWN AEROBIC ONLY Blood Culture adequate volume   Culture   Final    NO GROWTH < 24  HOURS Performed at [facility], [place] [address], [place], [place] [address]    Report Status PENDING  Incomplete    Lab Basic Metabolic Panel: Recent Labs  Lab 06/27/20 0452 06/28/20 1102 07/01/20 0257 07/01/20 1315 07/02/20 0411 07/02/20 1603  NA 137 136 133*  --  133*  --   K 3.6 3.5 4.0  --  4.6  --   CL 100 100 96*  --  97*  --   CO2 24 24 22   --  19*  --   GLUCOSE 98 185* 127*  --  301*  --   BUN 53* 52* 73*  --  111*  --   CREATININE 1.60* 1.92* 2.46*  --  4.42*  --   CALCIUM 8.6* 8.2* 8.2*  --  8.1*  --   MG  --  2.1  --  1.9  --  2.3  PHOS  --   --   --   --   --  5.5*   Liver Function Tests: Recent Labs  Lab  07/02/20 0730  AST 43*  ALT 20  ALKPHOS 118  BILITOT 1.3*  PROT 5.7*  ALBUMIN 2.2*   No results for input(s): LIPASE, AMYLASE in the last 168 hours. No results for input(s): AMMONIA in the last 168 hours. CBC: Recent Labs  Lab 06/27/20 0452 06/28/20 1102 07/02/20 0730  WBC 6.0 10.8* 16.2*  NEUTROABS 4.3 9.9*  --   HGB 12.3* 11.8* 12.0*  HCT 35.8* 34.7* 36.1*  MCV 93.0 93.8 93.8  PLT 82* 101* 121*   Cardiac Enzymes: No results for input(s): CKTOTAL, CKMB, CKMBINDEX, TROPONINI in the last 168 hours. Sepsis Labs: Recent Labs  Lab 06/27/20 0452 06/28/20 1102 07/02/20 0730  WBC 6.0 10.8* 16.2*    Procedures/Operations  Echocardiogram Renal ultrasound   [facility] 2020-07-20, 5:51 PM

## 2020-07-05 NOTE — Progress Notes (Signed)
Body  transferred to the morgue holding here at [facility]

## 2020-07-05 NOTE — Progress Notes (Signed)
Spoke with RN.  Patient passed early this morning.  Reflected with Vernon Briggs on the phone about the many good years they had together - sailing, traveling, riding in [employer]'s corvettes.  Reflected further on the care and love she has provided him over the last 3 very difficult years.  She supported as best she possibly could.  [name], PA-C Palliative Medicine Office:  [phone]

## 2020-07-05 NOTE — Progress Notes (Signed)
Patient passed, MD and wife notified.

## 2020-07-05 NOTE — Consult Note (Signed)
Offered spiritual/emotional/grief support and prayer to wife and son, which they much appreciated. Family is [place]. Pt was a [place] vet.  Rev. Eloise Levels Chaplain

## 2020-07-05 NOTE — Progress Notes (Signed)
   Jul 26, 2020 0105  Assess: MEWS Score  Temp 98.7 F (37.1 C)  BP (!) 93/46  Pulse Rate 72  Resp (!) 32  SpO2 90 %  O2 Device Nasal Cannula  O2 Flow Rate (L/min) 4 L/min  Assess: MEWS Score  MEWS Temp 0  MEWS Systolic 1  MEWS Pulse 0  MEWS RR 2  MEWS LOC 1  MEWS Score 4  MEWS Score Color Red  Assess: if the MEWS score is Yellow or Red  Were vital signs taken at a resting state? Yes  Focused Assessment No change from prior assessment  Early Detection of Sepsis Score *See Row Information* Medium  MEWS guidelines implemented *See Row Information* Yes  Take Vital Signs  Increase Vital Sign Frequency  Red: Q 1hr X 4 then Q 4hr X 4, if remains red, continue Q 4hrs  Escalate  MEWS: Escalate Red: discuss with charge nurse/RN and provider, consider discussing with RRT   Blood pressure rechecked- 104/51.

## 2020-07-05 DEATH — deceased

## 2020-07-07 LAB — BLOOD CULTURE ID PANEL (REFLEXED) - BCID2

## 2020-07-07 LAB — CULTURE, BLOOD (ROUTINE X 2)
Culture: NO GROWTH
Special Requests: ADEQUATE

## 2020-07-08 LAB — CULTURE, BLOOD (ROUTINE X 2): Special Requests: ADEQUATE

## 2020-07-12 ENCOUNTER — Ambulatory Visit: Payer: No Typology Code available for payment source | Admitting: Gastroenterology

## 2020-07-12 LAB — VITAMIN B1: Vitamin B1 (Thiamine): 138.9 nmol/L (ref 66.5–200.0)

## 2020-07-16 ENCOUNTER — Ambulatory Visit: Payer: No Typology Code available for payment source | Admitting: Cardiology

## 2020-08-04 NOTE — Progress Notes (Deleted)
Please refer to Dr Aldine Contes d/c summary. Thank you.

## 2021-08-02 IMAGING — DX DG CHEST 2V
2 series · 3 of 3 positions shown · non-contrast
Comparison: Chest radiograph dated 04/02/2015.

CLINICAL DATA: 73-year-old male with chest pain.

EXAM:
CHEST - 2 VIEW

[Series 2: chest lat · 0.14mm/px · 2 of 2 slices shown]
[im 1/2]
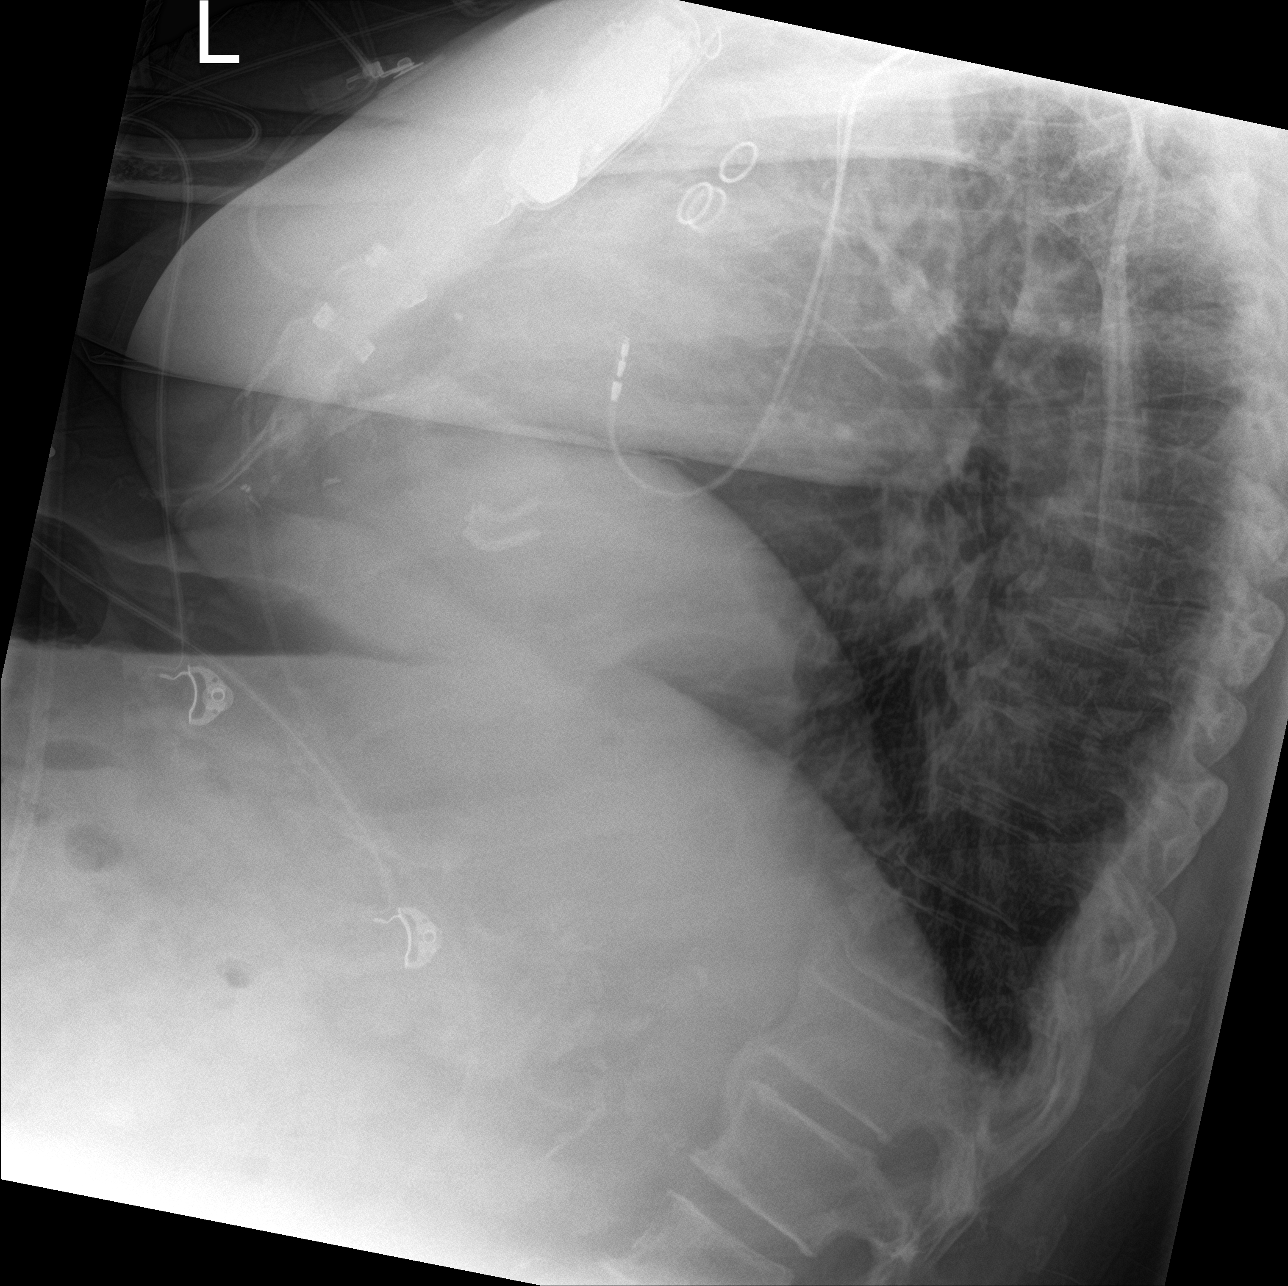
[im 2/2]
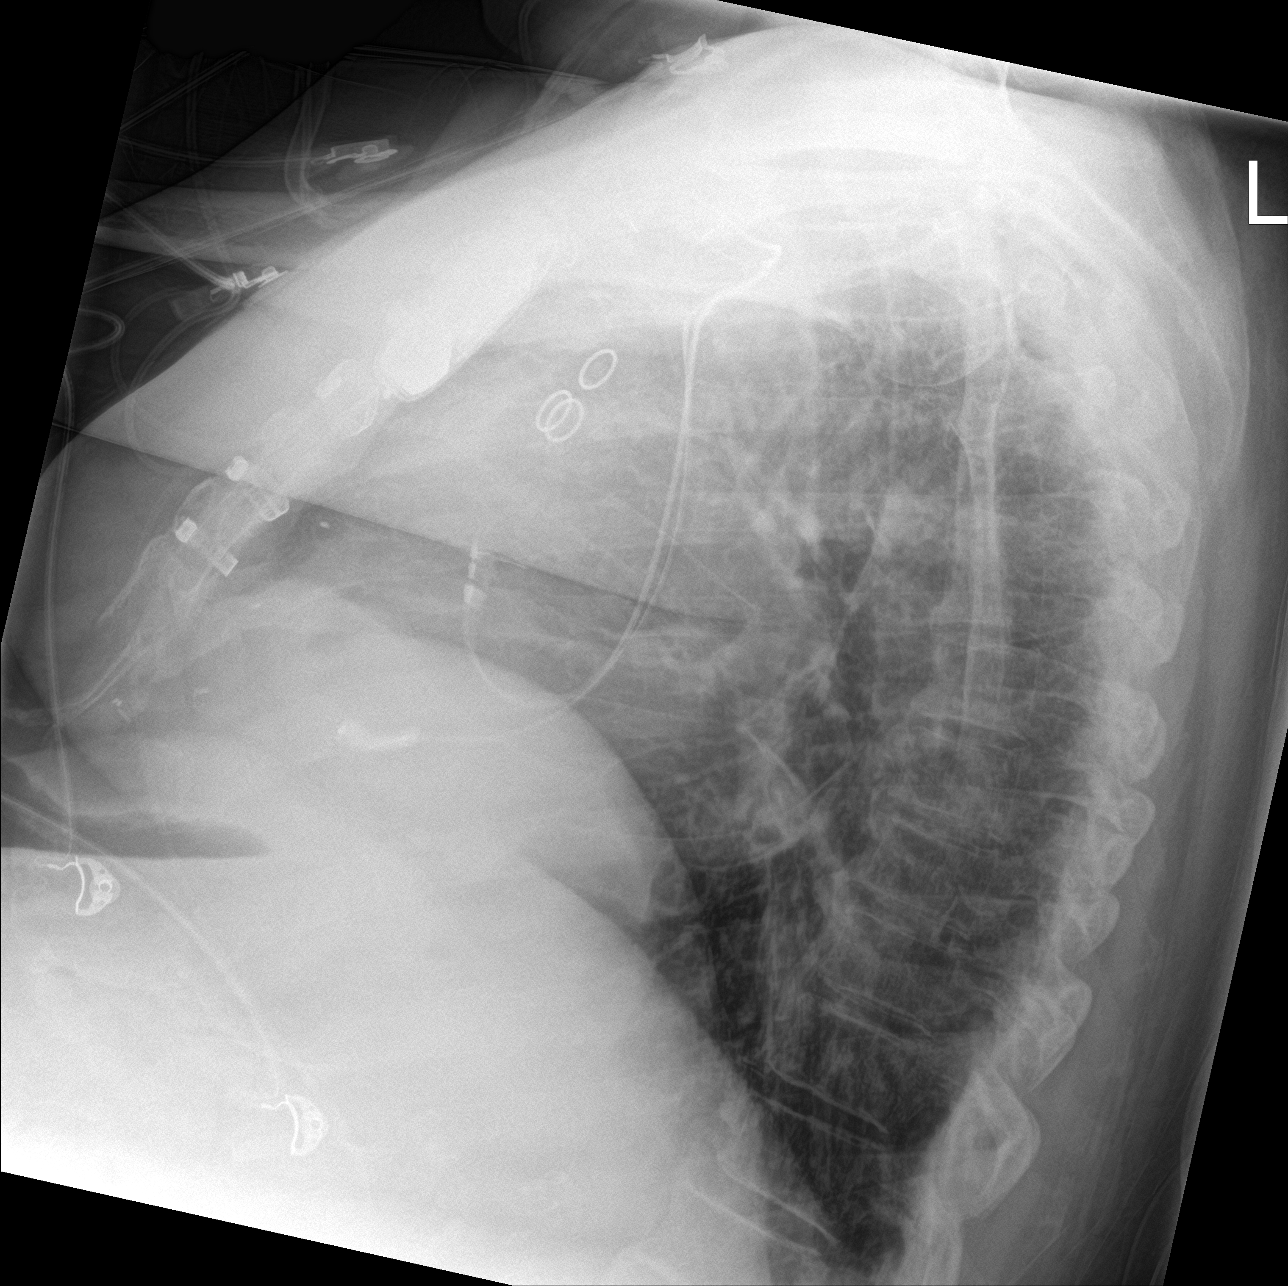

[chest ap]
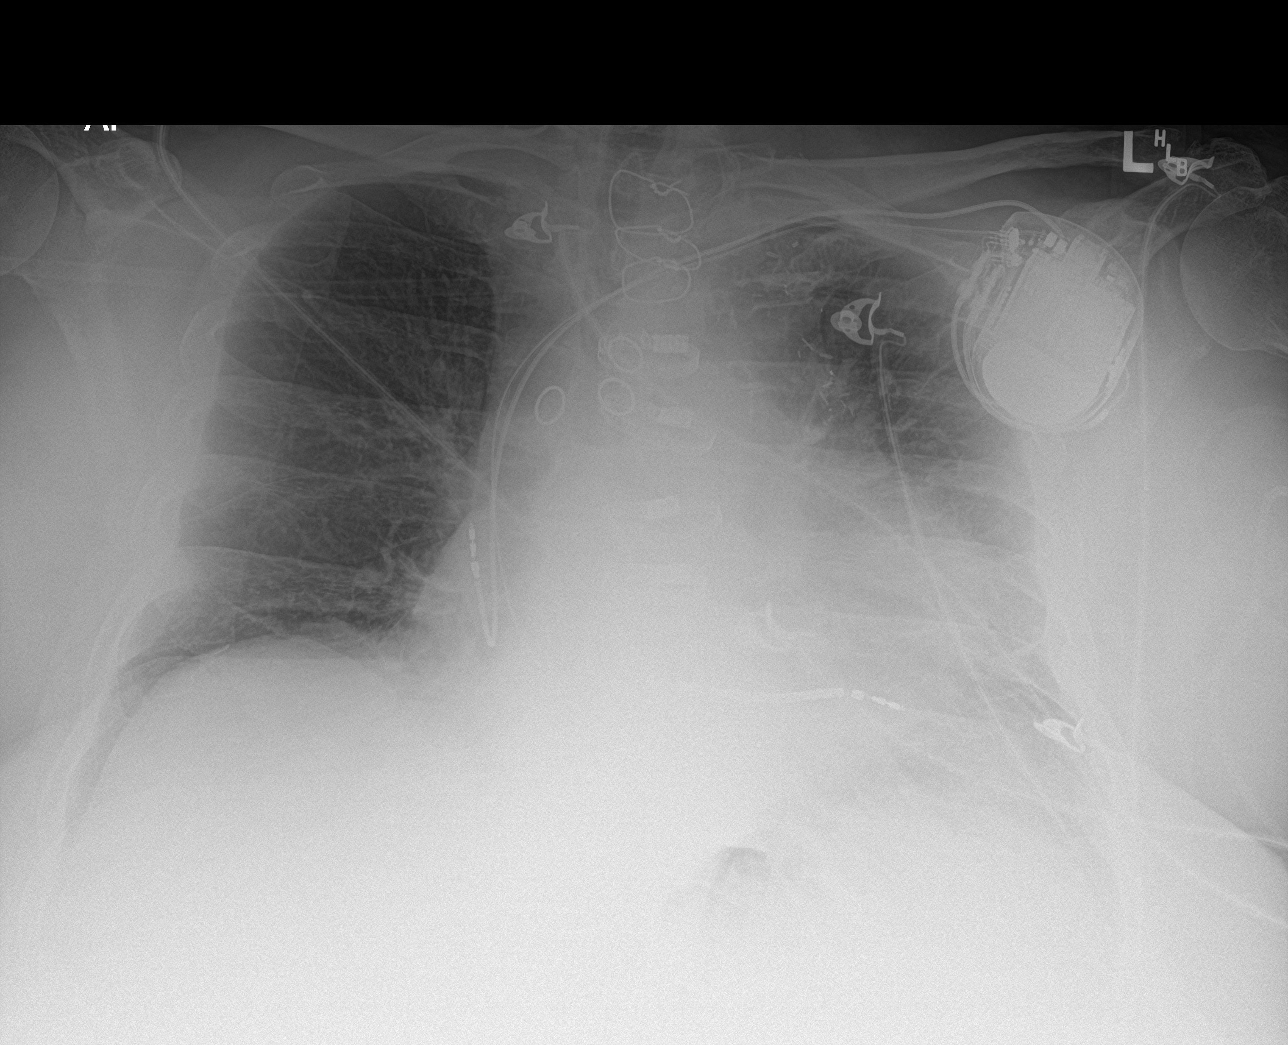

[3 of 3 positions shown; findings below may reference images not displayed]

FINDINGS: There is shallow inspiration. Left lung base linear densities,
likely related to atelectatic changes and interstitial crowding.
Asymmetric edema is less likely. Clinical correlation recommended.
No focal consolidation, pleural effusion, or pneumothorax. Stable
cardiomegaly. Median sternotomy wires and left pectoral AICD device.
No acute osseous pathology.
IMPRESSION: Shallow inspiration.  No focal consolidation.

## 2022-03-03 IMAGING — CR DG CHEST 2V
2 series · 2 of 2 positions shown · non-contrast
Comparison: 05/05/2020

CLINICAL DATA: Shortness of breath

EXAM:
CHEST - 2 VIEW

[chest lat]
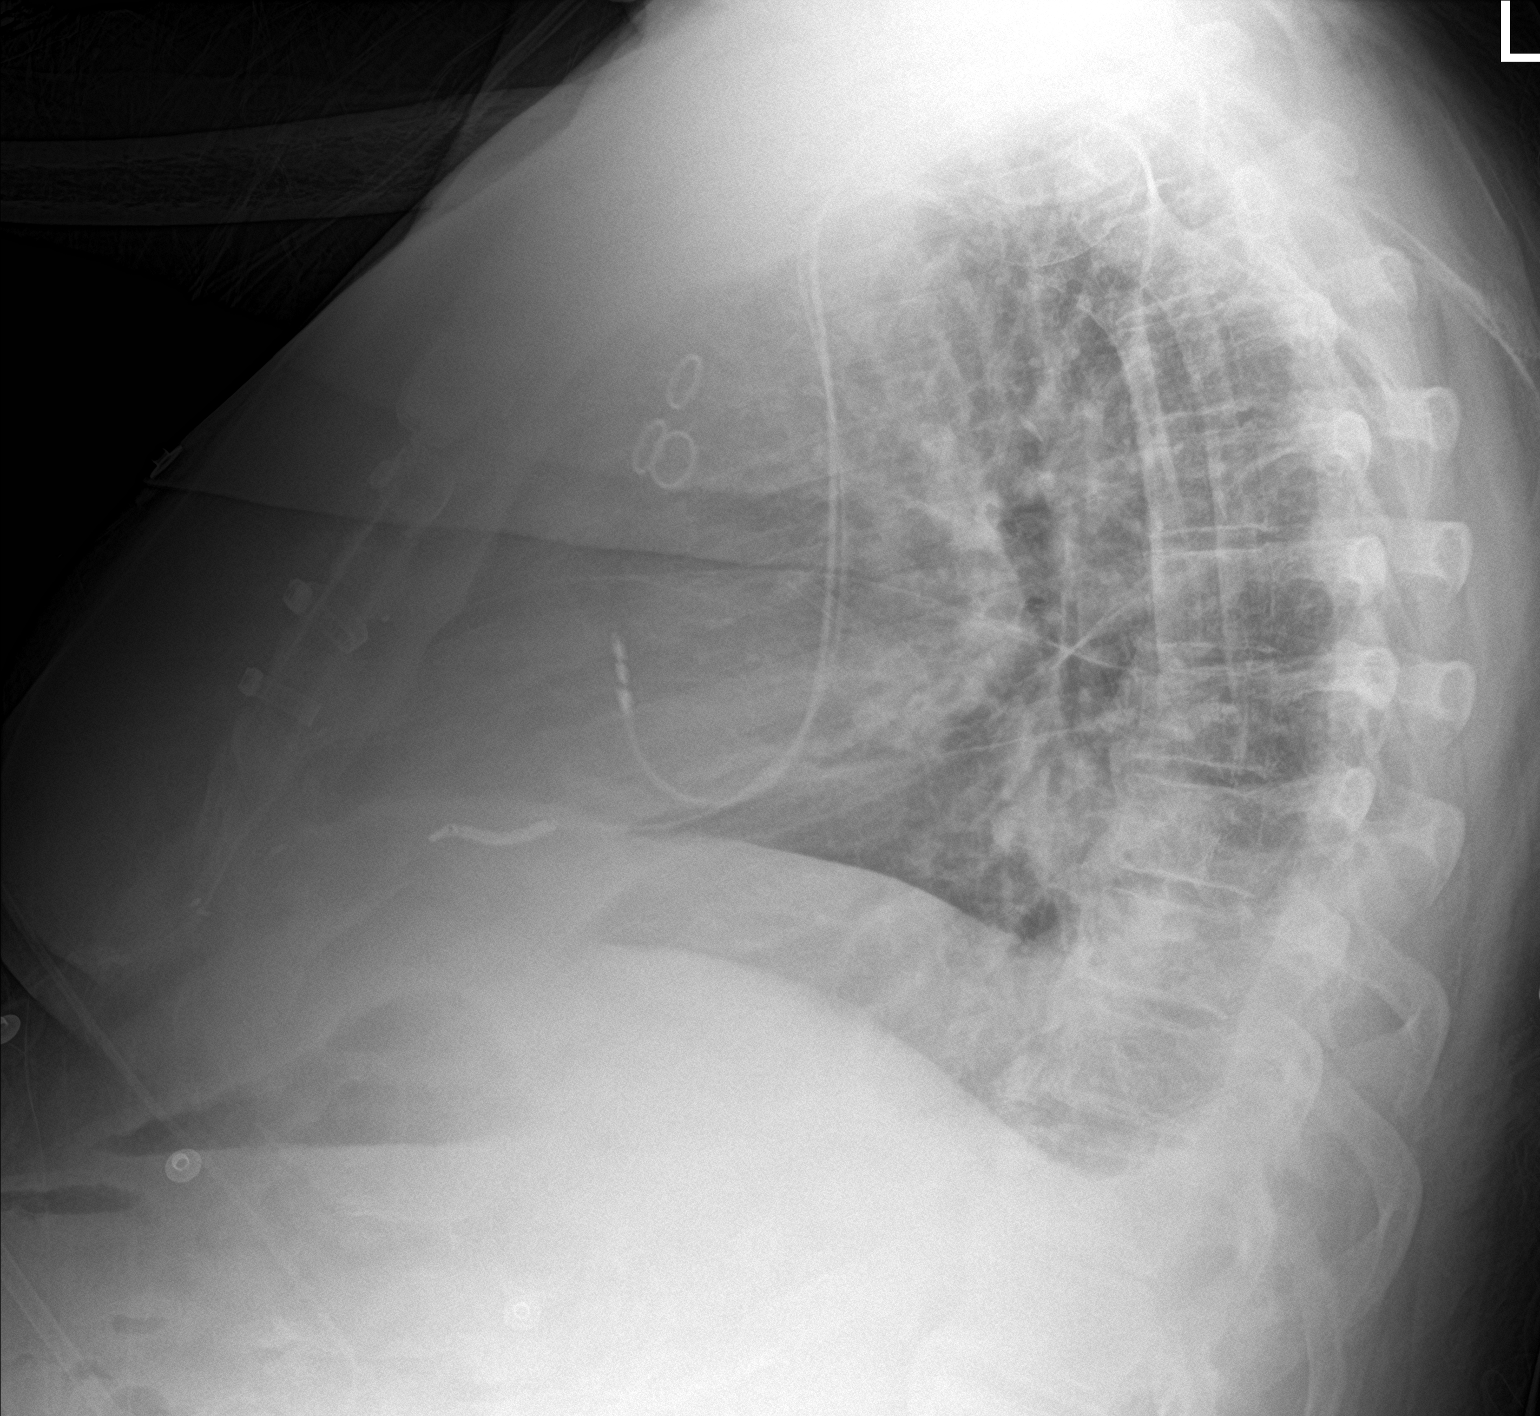

[chest ap]
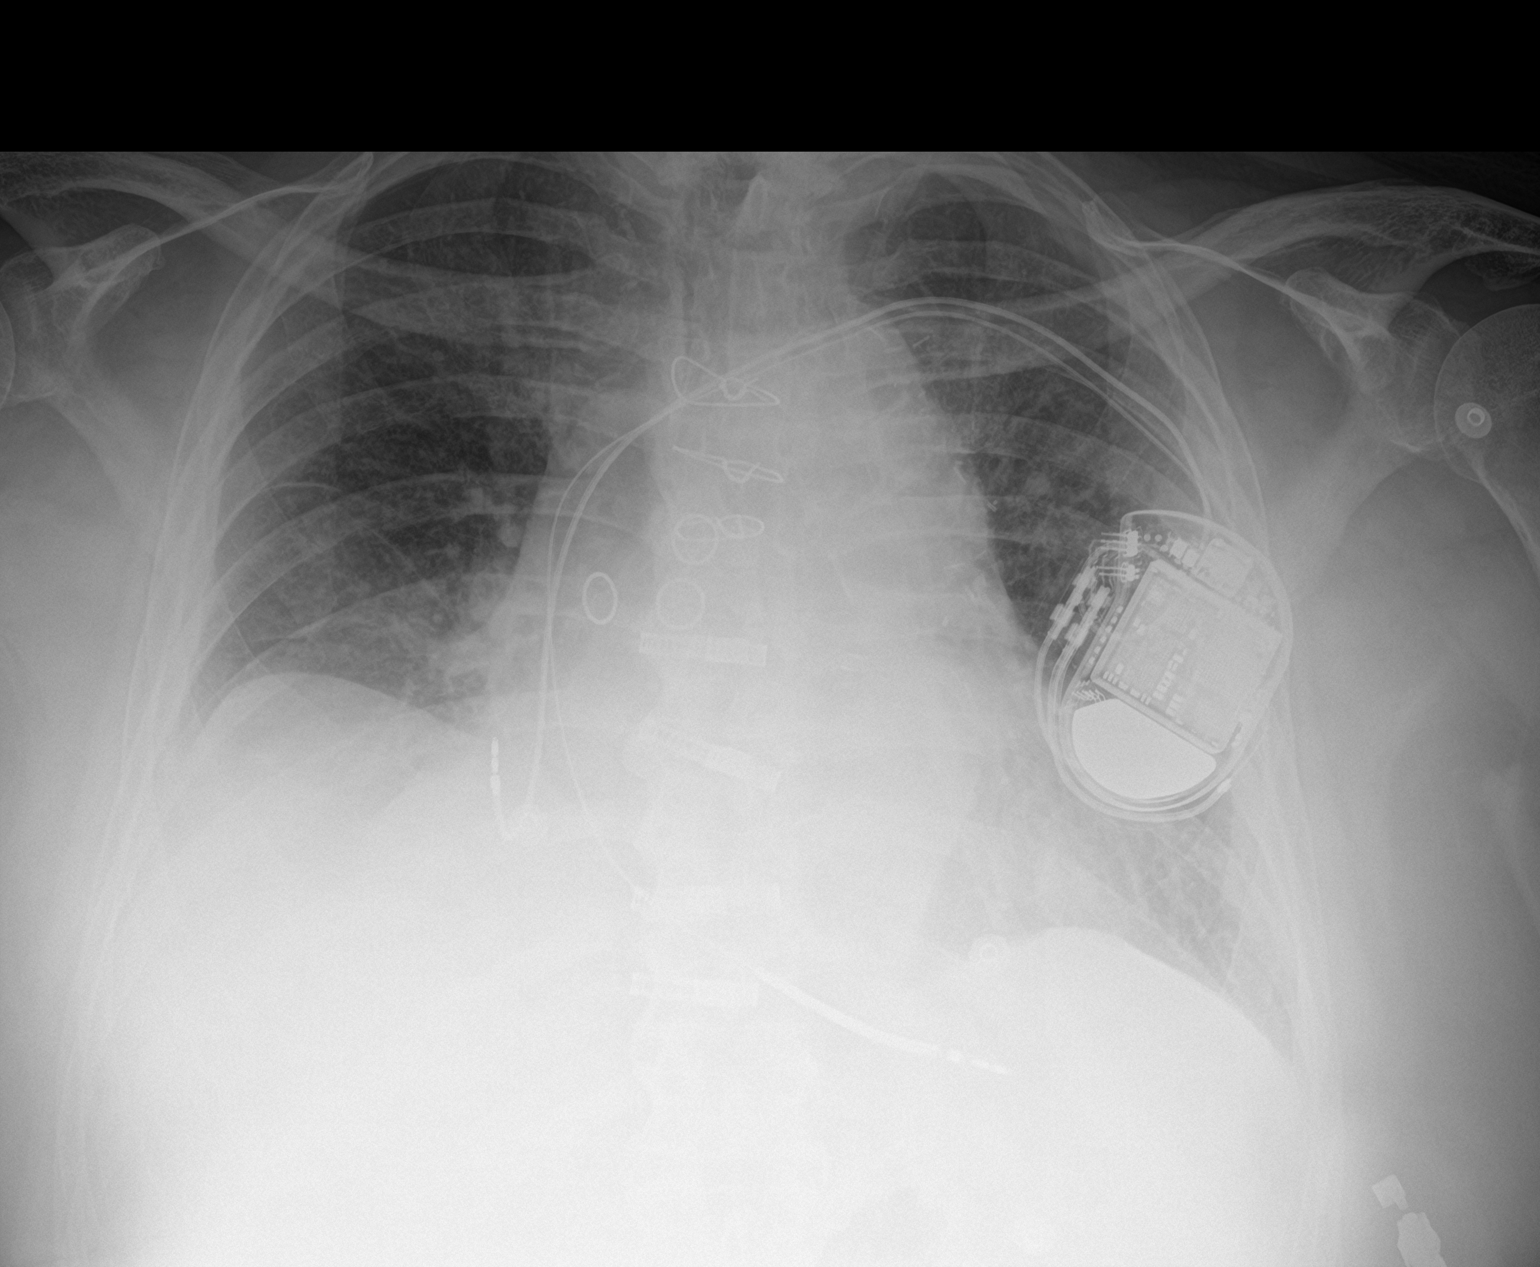

[2 of 2 positions shown; findings below may reference images not displayed]

FINDINGS: Cardiomegaly. Left pacing device remains in place unchanged. Prior
CABG. Mild vascular congestion. Right base atelectasis. Small left
pleural effusion.
IMPRESSION: Cardiomegaly, vascular congestion.

Small left effusion.

Right base atelectasis.

## 2022-03-16 IMAGING — US US RENAL
1 series · 14 of 25 positions shown · non-contrast
Comparison: 05/05/2020

CLINICAL DATA: Acute kidney injury

EXAM:
RENAL / URINARY TRACT ULTRASOUND COMPLETE

[Series 1: us renal · 14 of 39 slices shown]
[im 1/39]
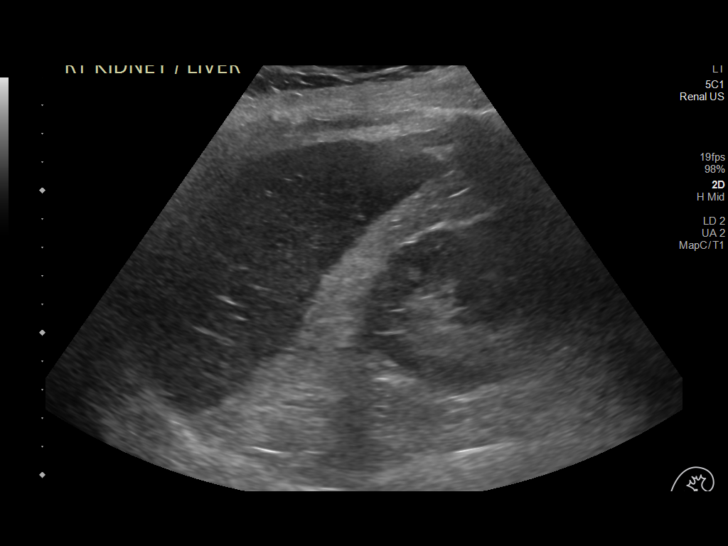
[im 4/39]
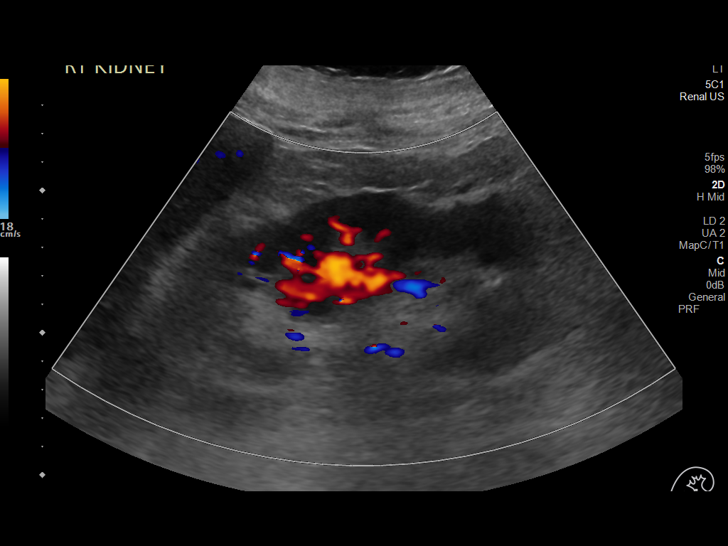
[im 7/39]
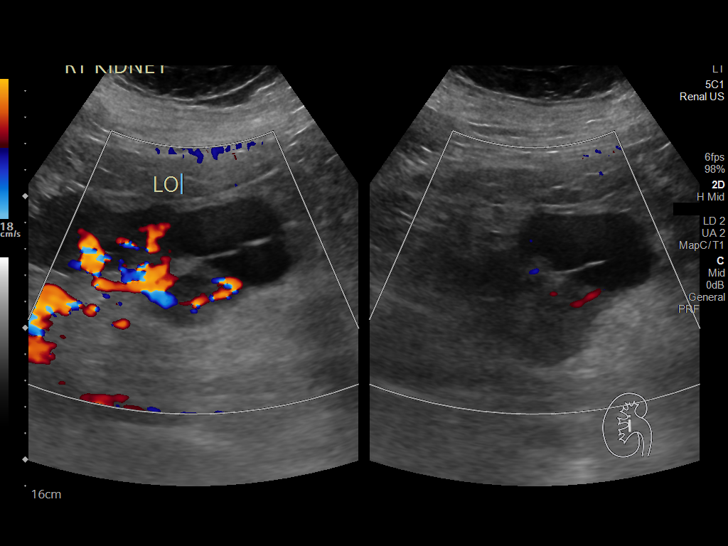
[im 10/39]
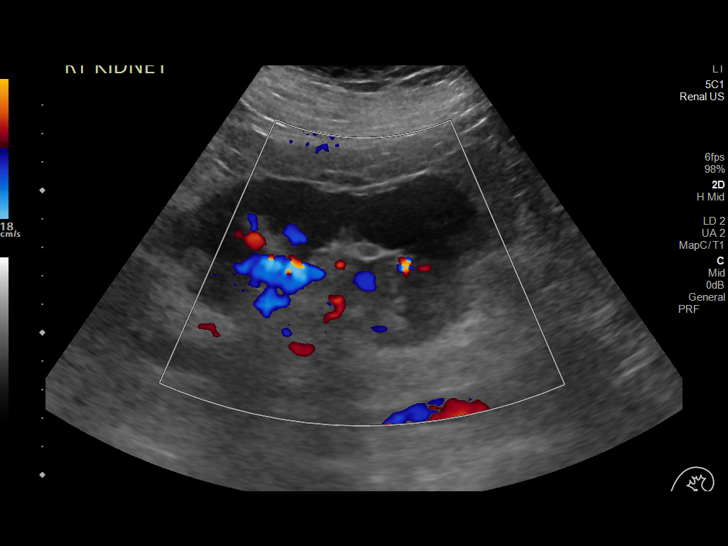
[im 13/39]
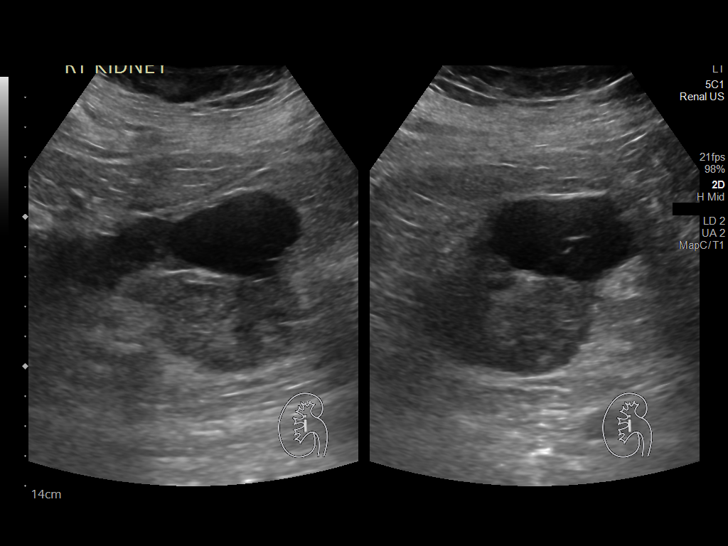
[im 15/39]
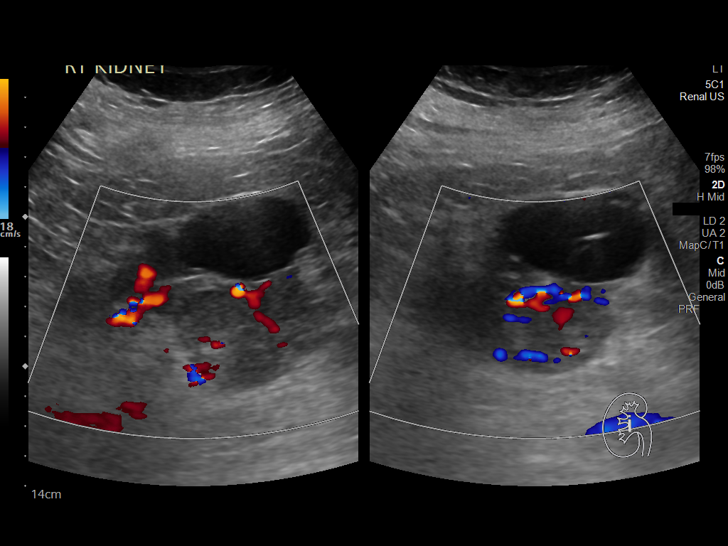
[im 18/39]
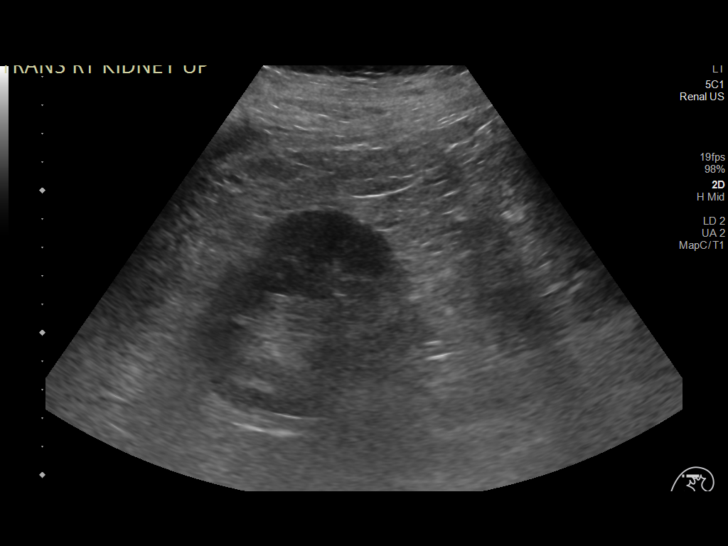
[im 21/39]
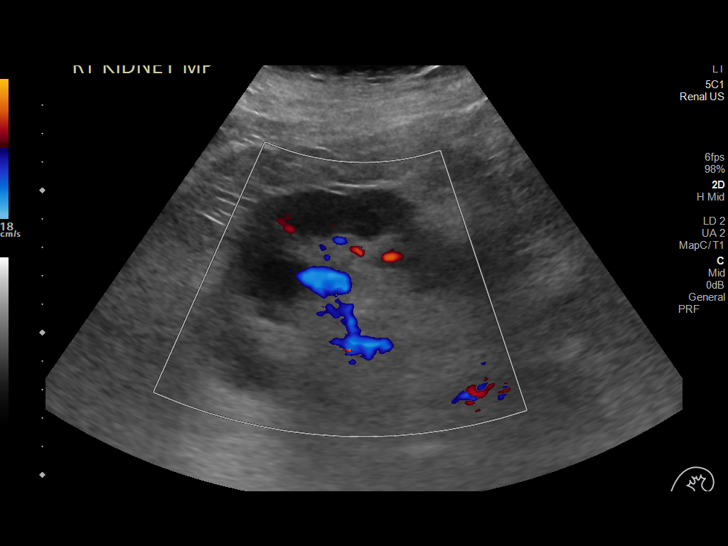
[im 24/39]
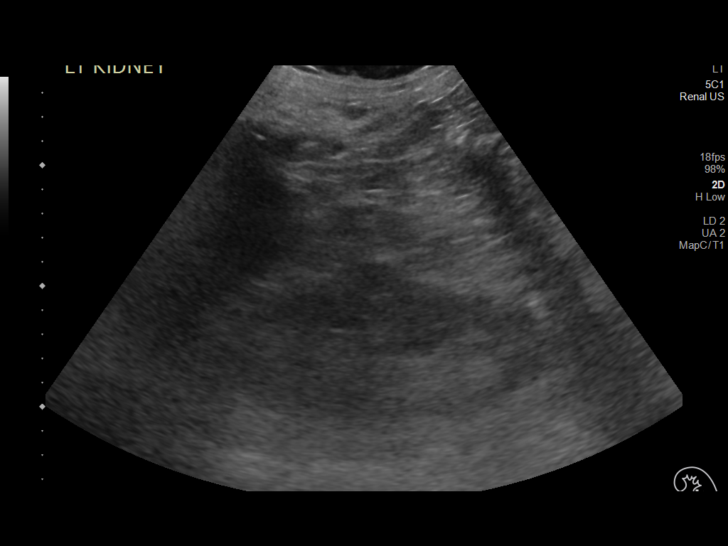
[im 26/39]
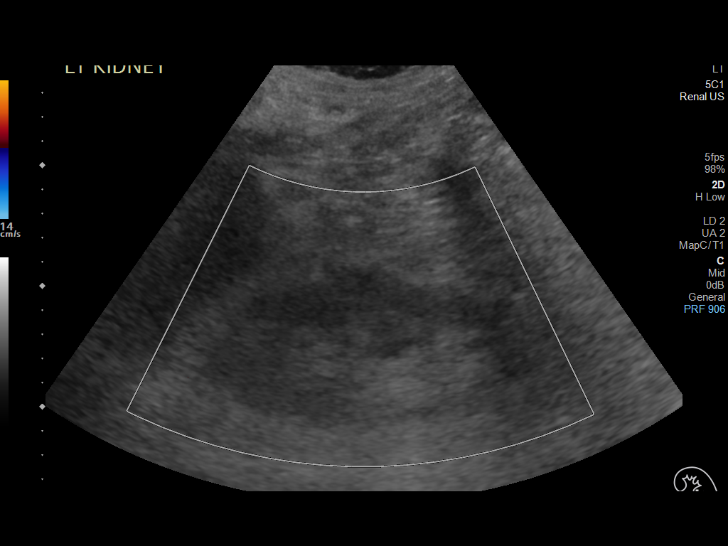
[im 29/39]
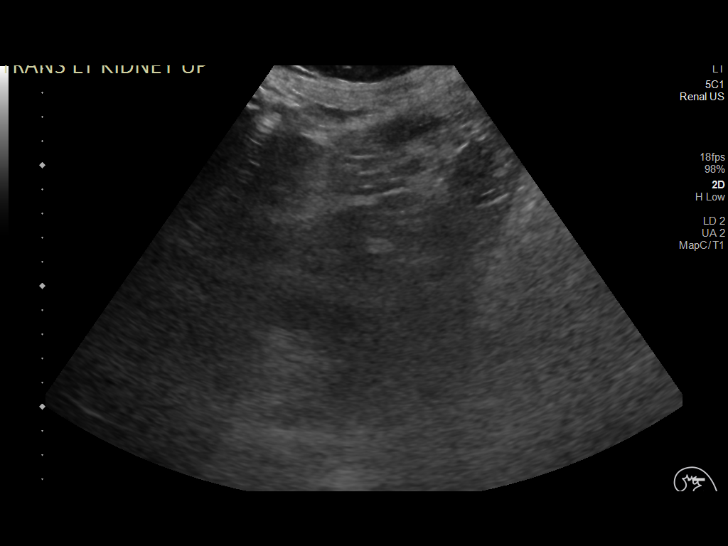
[im 32/39]
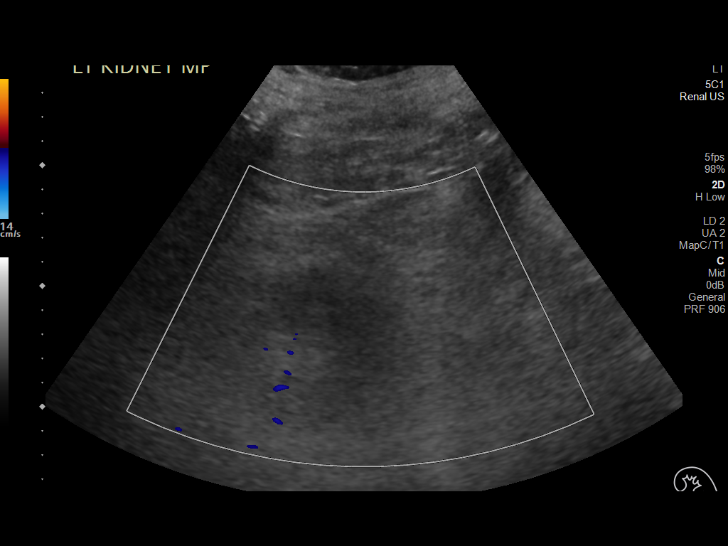
[im 35/39]
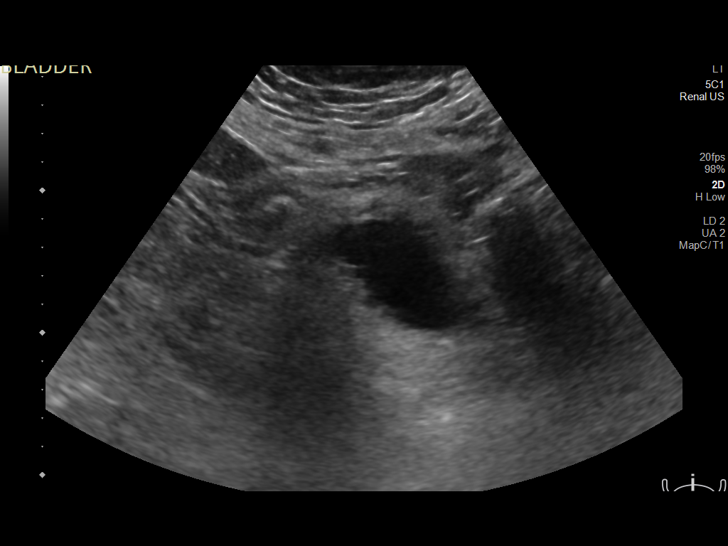
[im 39/39]
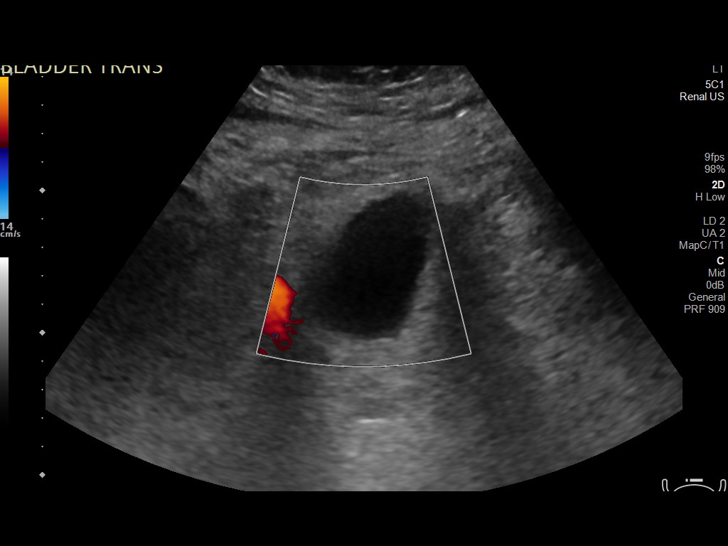

[14 of 25 positions shown; findings below may reference images not displayed]

FINDINGS: Right Kidney:

Renal measurements: 15 x 7 x 7.4 cm = volume: 435.3 mL. Cortical
echogenicity is normal. No hydronephrosis. Parapelvic upper pole
cyst measuring 3.1 x 2.6 x 2.6 cm. Septated lower pole cyst
measuring 5.1 x 3.2 x 5.1 cm. Solid appearing mass lower pole left
kidney measuring 6 x 3.5 x 5.8 cm.

Left Kidney:

Renal measurements: 14.2 x 6.9 x 7 cm = volume: 358.3 mL.
Echogenicity within normal limits. No mass or hydronephrosis
visualized. Limited visualization due to bowel gas and habitus.

Bladder:

Appears normal for degree of bladder distention.

Other:

None.
IMPRESSION: 1. Negative for hydronephrosis. Multiple right renal cysts including
5.1 cm septated cyst in the right kidney.
2. Concern for solid mass at the lower pole left kidney. Nonemergent
renal CT or MRI evaluation is suggested when clinically feasible.
